# Patient Record
Sex: Female | Born: 1935 | Race: White | Hispanic: No | Smoking: Never smoker
Health system: Southern US, Community
[De-identification: ages and names within clinical notes are randomized; demographics above are authoritative.]

## PROBLEM LIST (undated history)

## (undated) DIAGNOSIS — I10 Essential (primary) hypertension: Secondary | ICD-10-CM

## (undated) DIAGNOSIS — R519 Headache, unspecified: Secondary | ICD-10-CM

## (undated) DIAGNOSIS — J189 Pneumonia, unspecified organism: Secondary | ICD-10-CM

## (undated) DIAGNOSIS — E119 Type 2 diabetes mellitus without complications: Secondary | ICD-10-CM

## (undated) DIAGNOSIS — K219 Gastro-esophageal reflux disease without esophagitis: Secondary | ICD-10-CM

## (undated) DIAGNOSIS — M51369 Other intervertebral disc degeneration, lumbar region without mention of lumbar back pain or lower extremity pain: Secondary | ICD-10-CM

## (undated) DIAGNOSIS — M5136 Other intervertebral disc degeneration, lumbar region: Secondary | ICD-10-CM

## (undated) DIAGNOSIS — T4145XA Adverse effect of unspecified anesthetic, initial encounter: Secondary | ICD-10-CM

## (undated) DIAGNOSIS — R011 Cardiac murmur, unspecified: Secondary | ICD-10-CM

## (undated) DIAGNOSIS — R0600 Dyspnea, unspecified: Secondary | ICD-10-CM

## (undated) DIAGNOSIS — I38 Endocarditis, valve unspecified: Secondary | ICD-10-CM

## (undated) DIAGNOSIS — T8859XA Other complications of anesthesia, initial encounter: Secondary | ICD-10-CM

## (undated) DIAGNOSIS — J302 Other seasonal allergic rhinitis: Secondary | ICD-10-CM

## (undated) DIAGNOSIS — M1990 Unspecified osteoarthritis, unspecified site: Secondary | ICD-10-CM

## (undated) DIAGNOSIS — I350 Nonrheumatic aortic (valve) stenosis: Secondary | ICD-10-CM

## (undated) DIAGNOSIS — N183 Chronic kidney disease, stage 3 unspecified: Secondary | ICD-10-CM

## (undated) DIAGNOSIS — F32A Depression, unspecified: Secondary | ICD-10-CM

## (undated) DIAGNOSIS — N189 Chronic kidney disease, unspecified: Secondary | ICD-10-CM

## (undated) DIAGNOSIS — E785 Hyperlipidemia, unspecified: Secondary | ICD-10-CM

## (undated) DIAGNOSIS — D649 Anemia, unspecified: Secondary | ICD-10-CM

## (undated) DIAGNOSIS — K222 Esophageal obstruction: Secondary | ICD-10-CM

## (undated) DIAGNOSIS — F419 Anxiety disorder, unspecified: Secondary | ICD-10-CM

## (undated) DIAGNOSIS — F329 Major depressive disorder, single episode, unspecified: Secondary | ICD-10-CM

## (undated) DIAGNOSIS — H269 Unspecified cataract: Secondary | ICD-10-CM

## (undated) HISTORY — PX: ABDOMINAL HYSTERECTOMY: SHX81

## (undated) HISTORY — PX: TONSILLECTOMY: SUR1361

## (undated) HISTORY — PX: CARDIAC CATHETERIZATION: SHX172

## (undated) HISTORY — PX: APPENDECTOMY: SHX54

## (undated) HISTORY — PX: COLONOSCOPY: SHX174

---

## 2004-01-18 ENCOUNTER — Other Ambulatory Visit: Payer: Self-pay

## 2004-03-08 ENCOUNTER — Ambulatory Visit: Payer: Self-pay | Admitting: Gastroenterology

## 2004-05-03 ENCOUNTER — Ambulatory Visit: Payer: Self-pay | Admitting: Internal Medicine

## 2005-06-06 ENCOUNTER — Ambulatory Visit: Payer: Self-pay | Admitting: Internal Medicine

## 2005-10-02 ENCOUNTER — Ambulatory Visit: Payer: Self-pay | Admitting: Internal Medicine

## 2006-07-30 ENCOUNTER — Ambulatory Visit: Payer: Self-pay | Admitting: Internal Medicine

## 2007-08-26 ENCOUNTER — Ambulatory Visit: Payer: Self-pay | Admitting: Internal Medicine

## 2007-09-29 ENCOUNTER — Ambulatory Visit: Payer: Self-pay | Admitting: Internal Medicine

## 2008-03-24 ENCOUNTER — Ambulatory Visit: Payer: Self-pay | Admitting: Internal Medicine

## 2008-09-28 ENCOUNTER — Ambulatory Visit: Payer: Self-pay | Admitting: Internal Medicine

## 2009-02-24 ENCOUNTER — Ambulatory Visit: Payer: Self-pay | Admitting: Rheumatology

## 2009-04-20 ENCOUNTER — Ambulatory Visit: Payer: Self-pay | Admitting: Gastroenterology

## 2009-06-11 ENCOUNTER — Inpatient Hospital Stay: Payer: Self-pay | Admitting: Internal Medicine

## 2009-11-17 ENCOUNTER — Ambulatory Visit: Payer: Self-pay | Admitting: Internal Medicine

## 2011-01-04 ENCOUNTER — Ambulatory Visit: Payer: Self-pay | Admitting: Family Medicine

## 2011-05-16 ENCOUNTER — Ambulatory Visit: Payer: Self-pay | Admitting: Orthopedic Surgery

## 2011-06-18 ENCOUNTER — Ambulatory Visit: Payer: Self-pay | Admitting: General Practice

## 2011-06-18 DIAGNOSIS — I10 Essential (primary) hypertension: Secondary | ICD-10-CM

## 2011-06-18 LAB — BASIC METABOLIC PANEL
BUN: 30 mg/dL — ABNORMAL HIGH (ref 7–18)
Chloride: 104 mmol/L (ref 98–107)
Co2: 25 mmol/L (ref 21–32)
Creatinine: 0.99 mg/dL (ref 0.60–1.30)
Potassium: 4.1 mmol/L (ref 3.5–5.1)
Sodium: 143 mmol/L (ref 136–145)

## 2011-06-18 LAB — HEMOGLOBIN: HGB: 12.3 g/dL (ref 12.0–16.0)

## 2011-06-20 ENCOUNTER — Ambulatory Visit: Payer: Self-pay | Admitting: General Practice

## 2012-01-07 ENCOUNTER — Ambulatory Visit: Payer: Self-pay | Admitting: Internal Medicine

## 2012-01-10 ENCOUNTER — Ambulatory Visit: Payer: Self-pay | Admitting: Internal Medicine

## 2012-01-28 ENCOUNTER — Ambulatory Visit: Payer: Self-pay | Admitting: General Practice

## 2012-01-28 LAB — URINALYSIS, COMPLETE
Bacteria: NONE SEEN
Glucose,UR: >=500 mg/dL (ref 0–75)
Nitrite: NEGATIVE
Ph: 5 (ref 4.5–8.0)
RBC,UR: 1 /HPF (ref 0–5)
Squamous Epithelial: 1
WBC UR: 2 /HPF (ref 0–5)

## 2012-01-28 LAB — APTT: Activated PTT: 29.7 secs (ref 23.6–35.9)

## 2012-01-28 LAB — CBC
HCT: 37 % (ref 35.0–47.0)
MCH: 30.9 pg (ref 26.0–34.0)
MCHC: 34 g/dL (ref 32.0–36.0)
MCV: 91 fL (ref 80–100)
Platelet: 287 10*3/uL (ref 150–440)
WBC: 7.5 10*3/uL (ref 3.6–11.0)

## 2012-01-28 LAB — PROTIME-INR
INR: 1
Prothrombin Time: 13.3 secs (ref 11.5–14.7)

## 2012-01-28 LAB — BASIC METABOLIC PANEL
Anion Gap: 8 (ref 7–16)
Calcium, Total: 9.1 mg/dL (ref 8.5–10.1)
Creatinine: 1.15 mg/dL (ref 0.60–1.30)
EGFR (African American): 54 — ABNORMAL LOW
EGFR (Non-African Amer.): 47 — ABNORMAL LOW
Glucose: 282 mg/dL — ABNORMAL HIGH (ref 65–99)
Potassium: 3.7 mmol/L (ref 3.5–5.1)
Sodium: 140 mmol/L (ref 136–145)

## 2012-01-28 LAB — SEDIMENTATION RATE: Erythrocyte Sed Rate: 15 mm/hr (ref 0–30)

## 2012-01-28 LAB — MRSA PCR SCREENING

## 2012-01-29 LAB — URINE CULTURE

## 2012-02-13 ENCOUNTER — Inpatient Hospital Stay: Payer: Self-pay | Admitting: General Practice

## 2012-02-14 LAB — BASIC METABOLIC PANEL
BUN: 14 mg/dL (ref 7–18)
Chloride: 101 mmol/L (ref 98–107)
EGFR (Non-African Amer.): >60
Glucose: 196 mg/dL — ABNORMAL HIGH (ref 65–99)
Osmolality: 278 (ref 275–301)
Potassium: 3.7 mmol/L (ref 3.5–5.1)
Sodium: 136 mmol/L (ref 136–145)

## 2012-02-14 LAB — PLATELET COUNT: Platelet: 221 10*3/uL (ref 150–440)

## 2012-02-15 ENCOUNTER — Encounter: Payer: Self-pay | Admitting: Internal Medicine

## 2012-02-15 LAB — BASIC METABOLIC PANEL
Anion Gap: 7 (ref 7–16)
BUN: 14 mg/dL (ref 7–18)
Calcium, Total: 8.6 mg/dL (ref 8.5–10.1)
Co2: 28 mmol/L (ref 21–32)
Creatinine: 0.87 mg/dL (ref 0.60–1.30)
Potassium: 3.7 mmol/L (ref 3.5–5.1)
Sodium: 140 mmol/L (ref 136–145)

## 2012-02-15 LAB — HEMOGLOBIN: HGB: 9.7 g/dL — ABNORMAL LOW (ref 12.0–16.0)

## 2012-02-15 LAB — PLATELET COUNT: Platelet: 214 10*3/uL (ref 150–440)

## 2012-02-21 LAB — CBC WITH DIFFERENTIAL/PLATELET
Eosinophil %: 3.7 %
Lymphocyte %: 22.6 %
MCHC: 33.5 g/dL (ref 32.0–36.0)
MCV: 91 fL (ref 80–100)
Monocyte %: 10.2 %
Neutrophil %: 62.8 %
Platelet: 408 10*3/uL (ref 150–440)
RBC: 3.14 10*6/uL — ABNORMAL LOW (ref 3.80–5.20)
WBC: 9.5 10*3/uL (ref 3.6–11.0)

## 2012-02-22 ENCOUNTER — Encounter: Payer: Self-pay | Admitting: Internal Medicine

## 2012-07-19 ENCOUNTER — Other Ambulatory Visit: Payer: Self-pay | Admitting: Gastroenterology

## 2012-07-21 LAB — CLOSTRIDIUM DIFFICILE BY PCR

## 2012-07-23 LAB — STOOL CULTURE

## 2012-08-05 ENCOUNTER — Other Ambulatory Visit: Payer: Self-pay | Admitting: Gastroenterology

## 2012-08-07 LAB — STOOL CULTURE

## 2012-08-28 ENCOUNTER — Ambulatory Visit: Payer: Self-pay | Admitting: Hematology and Oncology

## 2012-10-07 ENCOUNTER — Ambulatory Visit: Payer: Self-pay | Admitting: Hematology and Oncology

## 2012-10-07 LAB — CBC CANCER CENTER
Basophil %: 0.7 %
Eosinophil #: 0.2 x10 3/mm (ref 0.0–0.7)
HCT: 35.1 % (ref 35.0–47.0)
Lymphocyte %: 39.6 %
MCH: 29.5 pg (ref 26.0–34.0)
MCV: 87 fL (ref 80–100)
Neutrophil #: 2.9 x10 3/mm (ref 1.4–6.5)
Neutrophil %: 47.3 %
Platelet: 281 x10 3/mm (ref 150–440)
RDW: 15.4 % — ABNORMAL HIGH (ref 11.5–14.5)

## 2012-10-21 ENCOUNTER — Ambulatory Visit: Payer: Self-pay | Admitting: Hematology and Oncology

## 2012-12-01 ENCOUNTER — Ambulatory Visit: Payer: Self-pay | Admitting: Hematology and Oncology

## 2013-01-07 ENCOUNTER — Ambulatory Visit: Payer: Self-pay | Admitting: Internal Medicine

## 2013-04-23 HISTORY — PX: JOINT REPLACEMENT: SHX530

## 2013-08-19 DIAGNOSIS — H18519 Endothelial corneal dystrophy, unspecified eye: Secondary | ICD-10-CM | POA: Insufficient documentation

## 2013-09-21 HISTORY — PX: EYE SURGERY: SHX253

## 2013-09-22 DIAGNOSIS — K219 Gastro-esophageal reflux disease without esophagitis: Secondary | ICD-10-CM | POA: Insufficient documentation

## 2013-11-21 HISTORY — PX: EYE SURGERY: SHX253

## 2014-01-08 ENCOUNTER — Ambulatory Visit: Payer: Self-pay | Admitting: Internal Medicine

## 2014-03-25 ENCOUNTER — Emergency Department: Payer: Self-pay | Admitting: Emergency Medicine

## 2014-03-25 ENCOUNTER — Ambulatory Visit: Payer: Self-pay | Admitting: Gastroenterology

## 2014-08-10 NOTE — Discharge Summary (Signed)
PATIENT NAME:  Samantha Freeman, Samantha Freeman MR#:  [id number] DATE OF BIRTH:  1935/05/18  DATE OF ADMISSION:  02/13/2012 DATE OF DISCHARGE:  02/16/2012   ADMITTING DIAGNOSIS: Degenerative arthrosis of right knee.   DISCHARGE DIAGNOSIS: Degenerative arthrosis of right knee.   HISTORY: The patient is a pleasant 79 year old who has been followed at [facility] for progression of right knee pain. She underwent a right knee arthroscopy, partial medial and lateral meniscectomy, as well as chondroplasty in February 2013. She did not see any significant improvement in her condition. She also did not see any significant improvement despite the use of anti-inflammatory medications as well as a series of Synvisc injections. The patient had localized most of the pain along the medial aspect of the knee. She did report some near giving way of the knee but denied any gross locking. On occasion the patient was noted to have some activity-related swelling. Her pain had progressed to the point that it was significantly interfering with her activities of daily living. X-rays taken in [facility] showed narrowing of the medial cartilage space with associated varus alignment. She did appear to have a slight evulsion fracture off the medial tibial plateau. Some mild subchondral sclerosis was noted. After discussion of the risks and benefits of surgical intervention, the patient expressed her understanding of the risks and benefits and agreed for plans for surgical intervention.   PROCEDURE: Right total knee arthroplasty using computer-assisted navigation.   ANESTHESIA: Femoral nerve block with spinal.   SOFT TISSUE RELEASE: Anterior cruciate ligament, posterior cruciate ligament, deep and superficial medial collateral ligaments, as well as the patellofemoral ligament.   IMPLANTS UTILIZED: DePuy PFC Sigma size 2 posterior stabilized femoral component (cemented), size 2.5 MBT tibial component (cemented), 32 mm three pegged oval  dome patella (cemented), and a 12.5 mm stabilized rotating platform polyethylene insert.   HOSPITAL COURSE: The patient tolerated the procedure very well. She had no complications. She was then taken to PAC-U where she was stabilized and then transferred to the orthopedic floor. She began receiving anticoagulation therapy of Lovenox 30 mg sub-Q q.12 hours per anesthesia and pharmacy protocol. She was fitted with TED stockings bilaterally. These were allowed to be removed one hour per eight hour shift. The right one was applied on day two following removal of the Hemovac and dressing change. The patient was also fitted with the AV-I compression foot pumps bilaterally set at 80 mmHg. She's had no evidence of any deep venous thromboses. Calves have been nontender. Negative Homans sign. Heels were elevated off the bed using rolled towels.   The patient denied chest pain or shortness of breath. Vital signs have been stable. She has been afebrile. Hemodynamically she was stable. No transfusions were given other than the Autovac transfusions given the first six hours postoperatively.   The patient began receiving physical therapy on day one for gait training and transfers. She progressed very nicely. Occupational therapy was also initiated on day one for activities of daily living and assistive devices.   The patient's IV, Foley, and Hemovac were discontinued on day two along with a dressing change. The wound was free of any drainage or signs of infection. The Polar Care was reapplied to the surgical leg maintaining a temperature of 40 to 50 degrees Fahrenheit.   The patient is being discharged to skilled nursing facility in improved stable condition.   DISCHARGE INSTRUCTIONS:  1. She may weight bear as tolerated.  2. She will receive PT for gait training and transfers.  3. OT for activities of daily living and assistive devices.  4. Knee immobilizer is to be worn until she is able to do 10 straight leg  raises on her own.  5. She is to continue with TED stockings bilaterally. These are allowed to be removed one hour per eight hour shift.  6. Incentive spirometer q.1 hour while awake.  7. Encourage cough, deep breathing q.2 hours while awake.  8. Polar Care to the surgical leg maintaining a temperature of 40 to 50 degrees Fahrenheit.  9. She is placed on an ADA diet.  10. She has a follow-up appointment with Vance Peper, PA on 02/28/2012 at 10:15 and with Dr. Skip Estimable on 03/25/2012 at 3:45.  11. Elevate heels off the bed.   DRUG ALLERGIES: Codeine, hydrochlorothiazide, niacin tablets.    MEDICATIONS:  1. Insulin sliding scale Novolin R injections. 2. Tylenol ES 500 to 1000 mg q.4 hours p.r.n. for pain or temperature greater than one 100.4.  3. Celebrex 200 mg b.i.d.  4. Roxicodone 5 to 10 mg q.4 hours p.r.n. pain. 5. Tramadol 50 mg to 100 mg q.4 hours p.r.n. for pain.  6. Dulcolax suppository 10 mg rectally daily p.r.n. for constipation.  7. Milk of Magnesia 30 mL b.i.d. p.r.n. for constipation.  8. Enema soapsuds p.r.n. if no results with Milk of Magnesia or Dulcolax.  9. Mylanta DS 30 mL q.6 hours p.r.n.  10. Senokot-S 1 tablet b.i.d.  11. Vasotec 20 mg b.i.d. 12. Fenofibrate micronized 145 mg daily. 13. Ambien 10 mg at bedtime p.r.n.  14. Multivitamin 1 capsule daily.  15. Citracal Plus D 1 capsule b.i.d.  16. Celexa 40 mg daily.  17. Crestor 10 mg daily. 18. Prilosec 20 mg q.6 a.m.  19. Magnesium oxide 400 mg daily. 20. Ziac 5/6.25 mg 1 tablet daily. 21. Flonase nasal spray one spray both nostrils daily. 22. Non-formulary medication, research trial medication, 1 capsule daily for diabetes. The patient has her own medication and will be taking this with her to rehab.  23. Lovenox 30 mg sub-Q q.12 hours for 14 days, then discontinue and begin taking one 81 mg enteric-coated aspirin.   PAST MEDICAL HISTORY:  1. Chickenpox. 2. Arthritis. 3. Seasonal allergies.   4. Hypertension.  5. Hyperlipidemia.  6. Type II diabetes.  7. Shingles.  8. Hemorrhoids.  9. Cataracts, left eye.   10. Mild anxiety. 11. Hospitalized for rectal bleeding in 2011.   ____________________________ Vance Peper, PA jrw:drc D: 02/14/2012 21:43:31 ET T: 02/15/2012 05:58:21 ET JOB#: [id number]  cc: Vance Peper, PA, <Dictator> Fabricio Endsley PA ELECTRONICALLY SIGNED 02/15/2012 22:04

## 2014-08-10 NOTE — Consult Note (Signed)
Brief Consult Note: Diagnosis: uncontrolled htn.   Patient was seen by consultant.   Consult note dictated.   Orders entered.   Comments: 79 yr old female s/p right knee arhtropasty for DJD,consult requested for Uncontrolled HTN.continue enalapril 20 mg po bid,bisoprolol increased to 5/12.5 mg po bid,if bp persistantly high.,add hydrallazine 25 mg tid. DMII:BG high,but sh eis on research tril meds.  Electronic Signatures: Epifanio Lesches (MD)  (Signed 25-Oct-13 14:00)  Authored: Brief Consult Note   Last Updated: 25-Oct-13 14:00 by Epifanio Lesches (MD)

## 2014-08-10 NOTE — Consult Note (Signed)
PATIENT NAME:  Samantha Freeman, Samantha Freeman MR#:  [id number] DATE OF BIRTH:  [birth year]  DATE OF CONSULTATION:  02/15/2012  REFERRING PHYSICIAN:  Skip Estimable, MD CONSULTING PHYSICIAN:  Epifanio Lesches, MD  PRIMARY CARE PHYSICIAN:  Dr. Doy Hutching.   REASON FOR CONSULTATION:  Uncontrolled hypertension.   HISTORY OF PRESENT ILLNESS: 79 year old female with history of hypertension and diabetes, admitted on the 23rd to orthopedic service for right total knee replacement. The patient has degenerative joint disease and had a right total knee arthroplasty on the 23rd. The patient has been under orthopedic service. They noted that the patient's blood pressure is elevated up to 198/73 and 181/70. The patient's heart rate is around 70s. The patient is already on bisoprolol and lisinopril. The patient is on bisoprolol with HCTZ 5/6.25 p.o. daily. She is on Enalapril 20 mg p.o. b.i.d. The patient right now is awaiting physical therapy and denies any other complaints. No chest pain. No shortness of breath.   ALLERGIES: The patient is allergic to codeine, HCTZ and  niacin.   PRESENT MEDICATIONS:  1. Calcium citrate 1 tablet p.o. b.i.d.  2. Celebrex 200 mg p.o. b.i.d.  3. Celexa 40 mg daily. 4. Docusate Senna as needed. 5. Enalapril 20 mg p.o. b.i.d.  6. Fenofibrate 145 milligrams. 7. Fluticasone nasal spray one spray in each nostril daily. 8. Magnesium oxide 400 mg p.o. daily.  9. Reglan 10 mg p.o. daily.  10. She is on research medication for diabetes. 11. Lovenox 30 mg every 12 hours along with insulin with sliding scale coverage. 12. She is on IV fluids normal saline at 100 mL normal.   SOCIAL HISTORY: No smoking, no drinking. No drugs. Lives with husband.   PAST SURGICAL HISTORY: 1. Hysterectomy. 2. Tonsillectomy.   PAST MEDICAL HISTORY:    1. Diabetes. 2. Hypertension. 3. Hypertriglyceridemia. 4. Hyperlipidemia. 5. Depression. 6. Anxiety. 7. History of hiatal hernia.   REVIEW OF SYSTEMS:  CONSTITUTIONAL: No fever. No chills. ENT: Positive for allergies. No hearing loss. No sore throat. No difficulty swallowing. CARDIOVASCULAR: No chest pain. RESPIRATORY: No trouble breathing. GASTROINTESTINAL: Has some nausea. No vomiting. No diarrhea. GENITOURINARY: No burning sensation. MUSCULOSKELETAL: Has right knee arthroplasty, getting physical therapy. INTEGUMENTARY: No skin rashes. NEUROLOGIC: The patient has no blackouts or no fainting. PSYCH: Has history of anxiety and depression. ENDOCRINE: No thyroid problems. Has diabetes and she is on Research trial medication.   PHYSICAL EXAMINATION:   VITAL SIGNS: Temperature 99.3 this morning, pulse 69 to 75. Blood pressure this morning 188/73 and a repeat one at around 12:40 is 174/74, sats 92%.   GENERAL: The patient is alert, awake and oriented.   HEENT: Head atraumatic, normocephalic. Pupils equally reacting to light. Extraocular movements are intact.   ENT: No tympanic membrane congestion. No turbinate hypertrophy. No oropharyngeal erythema.   NECK: Normal range of motion. No JVD. No carotid bruit.   CARDIOVASCULAR: S1 and S2 regular. No murmurs.   LUNGS: Clear to auscultation. No wheeze. No rales.   ABDOMEN: Soft, nontender, nondistended. Bowel sounds present. No hernias.   MUSCULOSKELETAL: Has right knee arthroplasty with dressing present.   SKIN: No skin rashes.   NEUROLOGIC: Cranial nerves II through XII intact. Deep tendon reflexes 2+ bilaterally. Sensations are intact.   PSYCHIATRIC: Oriented to time, place, person.  LABORATORY, RADIOLOGICAL AND DIAGNOSTIC DATA: This morning sodium 140, potassium 3.7, chloride 105, bicarbonate 28, BUN 14, creatinine 0.87, glucose 207. Hemoglobin this morning 9.7. Most recent blood glucose 293.   ASSESSMENT AND PLAN: The patient is  a 79 year old female status post right knee arthroplasty has elevated hypertension. She is already on bisoprolol and lisinopril. I increased the bisoprolol to twice  daily 5/12.5 mg and continue the enalapril. If blood pressure is still high, can add the hydralazine 25 t.i.d. and continue the pain medications for the right knee. She is on oxycodone 5 to 10 q.4 hours p.r.n. For her diabetes, she is on Research Medication sugar set up and blood sugar is around 293. She is on sliding scale, so continue the sliding scale coverage. The patient will be seen by [facility] physician tomorrow.   TIME SPENT: About 55 minutes.    ____________________________ Epifanio Lesches, MD sk:ap D: 02/15/2012 14:07:18 ET T: 02/15/2012 14:47:37 ET JOB#: [id number]  cc: Epifanio Lesches, MD, <Dictator> Leonie Douglas. Doy Hutching, MD  Epifanio Lesches MD ELECTRONICALLY SIGNED 03/04/2012 19:34

## 2014-08-10 NOTE — Op Note (Signed)
PATIENT NAME:  Samantha Freeman, Samantha Freeman MR#:  [id number] DATE OF BIRTH:  Feb 05, 1936  DATE OF PROCEDURE:  02/13/2012  PREOPERATIVE DIAGNOSIS: Degenerative arthrosis of the right knee.   POSTOPERATIVE DIAGNOSIS: Degenerative arthrosis of the right knee.  PROCEDURE PERFORMED: Right total knee arthroplasty using computer-assisted navigation.   SURGEON: Laurice Record. Holley Bouche., MD  ASSISTANT: Vance Peper, PA-C (required to maintain retraction throughout the procedure)   ANESTHESIA: Femoral nerve block and spinal.   ESTIMATED BLOOD LOSS: 100 mL.   FLUIDS REPLACED: 1500 mL of crystalloid.   TOURNIQUET TIME: 85 minutes.   DRAINS: Two medium drains to Autovac.   SOFT TISSUE RELEASES: Anterior cruciate ligament, posterior cruciate ligament, deep and superficial medial collateral ligament, and patellofemoral ligament.   IMPLANTS UTILIZED: DePuy PFC Sigma size 2 posterior stabilized femoral component (cemented), size 2.5 MBT tibial component (cemented), 32 mm three peg oval dome patella (cemented), and a 12.5 mm stabilized rotating platform polyethylene insert.   INDICATIONS FOR SURGERY: The patient is a 79 year old female who has been seen for complaints of progressive right knee pain. X-rays demonstrated significant degenerative changes in tricompartmental fashion with relative varus deformity. After discussion of the risks and benefits of surgical intervention, the patient expressed her understanding of the risks and benefits and agreed with plans for surgical intervention.   PROCEDURE IN DETAIL: Patient was brought into the Operating Room and, after adequate femoral nerve block and spinal anesthesia was achieved, a tourniquet was placed on the patient's upper right thigh. Patient's right knee and leg were cleaned and prepped with alcohol and DuraPrep and draped in the usual sterile fashion. A "timeout" was performed as per usual protocol. The right lower extremity was exsanguinated using Esmarch, and  tourniquet was inflated to 300 mmHg. Anterior longitudinal incision was made followed by a standard mid vastus approach. A moderate effusion was evacuated. Deep fibers of the medial collateral ligament were elevated in subperiosteal fashion off the medial flare of the tibia so as to maintain a continuous soft tissue sleeve. Patella was subluxed laterally and patellofemoral ligament was incised. Inspection of the knee demonstrated severe degenerative changes most notably to the medial compartment. Prominent osteophytes were debrided using rongeur. Anterior and posterior cruciate ligaments were excised. Two 4.0 mm Schanz pins were inserted into the femur and into the tibia for attachment of the array of spheres used for computer-assisted navigation. Hip center was identified using circumduction technique. Distal landmarks were mapped using computer. Distal femur and proximal tibia were mapped using computer. Distal femoral cutting guide was positioned using computer-assisted navigation so as to achieve a 5 degrees distal valgus cut. Cut was performed and verified using the computer. Distal femur was sized and it was felt that a size 2 femoral component was appropriate. Size 2 cutting guide was positioned and the anterior cut was performed and verified using computer. This was followed by completion of the posterior and chamfer cuts. Femoral cutting guide for the central box was then positioned and the central box cut was performed.   Attention was then directed to the proximal tibia. Medial and lateral menisci were excised. The extramedullary tibial cutting guide was positioned using computer-assisted navigation so as to achieve 0 degree varus valgus alignment and 0 degree posterior slope. Cut was performed and verified using computer. Proximal tibia was sized and it was felt that a size 2.5 tibial tray was appropriate. Tibial and femoral trials were inserted followed by insertion of a 10 mm polyethylene insert. The  knee was felt to be  tight medially. A Cobb elevator was used to elevate the superficial fibers of the medial collateral ligament. A 10 mm polyethylene spacer was replaced with a 12.5 mm spacer. Excellent mediolateral soft tissue balancing was appreciated both in full extension and in flexion. Finally, the patella was cut and prepared so as to accommodate a 32 mm three peg oval dome patella. Patellar trial was placed and the knee was placed through a range of motion with excellent patellar tracking appreciated.   Femoral trial was removed. Central post hole for the tibial component was reamed followed by insertion of a keel punch. Tibial tray was then removed. Cut surfaces of bone were irrigated with copious amounts of normal saline with antibiotic solution using pulsatile lavage and then suctioned dry. Polymethyl methacrylate cement with gentamicin was prepared in the usual fashion using vacuum mixer. Cement was applied to the cut surface of the proximal tibia as well as along the undersurface of a size 2.5 mm MBT tibial component. Tibial component was positioned and impacted into place. Excess cement was removed using freer elevators. Cement was then applied to the cut surface of the femur as well as along the posterior flanges of size 2 posterior stabilized femoral component. Femoral component was positioned and impacted into place. Excess cement was removed using freer elevators. A 12.5 mm polyethylene trial was inserted and the knee was brought into full extension with steady axial compression applied. Finally, cement was applied to the backside of a 32 mm three peg oval dome patella and patellar component was positioned and patellar clamp applied. Excess cement was removed using freer elevators.   After adequate curing of cement, tourniquet was deflated after total tourniquet time of 85 minutes. Hemostasis was achieved using electrocautery. The knee was irrigated with copious amounts of normal saline with  antibiotic solution using pulsatile lavage and then suctioned dry. The knee was inspected for any residual cement debris. 30 mL of 0.25% Marcaine with epinephrine was injected along the posterior capsule. A 12.5 mm stabilized rotating platform polyethylene insert was inserted and the knee was placed through a range of motion. Excellent patellar tracking was appreciated and excellent mediolateral soft tissue balancing was appreciated. Two medium drains were placed in the wound bed and brought out through a separate stab incision to be attached to reinfusion system. The medial parapatellar portion of the incision was reapproximated using interrupted sutures of #1 Vicryl. Subcutaneous tissue was approximated in layers using first #0 Vicryl followed by 2-0 Vicryl. Skin was closed with skin staples. Sterile dressing was applied.   Patient tolerated procedure well. She was transported to the recovery room in stable condition.    ____________________________ Laurice Record. Holley Bouche., MD jph:cms D: 02/13/2012 11:06:24 ET T: 02/13/2012 11:18:27 ET JOB#: [id number]  cc: Jeneen Rinks P. Holley Bouche., MD, <Dictator> JAMES P Holley Bouche MD ELECTRONICALLY SIGNED 02/20/2012 6:22

## 2014-08-15 NOTE — Op Note (Signed)
PATIENT NAME:  Samantha Freeman, Samantha Freeman MR#:  [id number] DATE OF BIRTH:  [birth year]  DATE OF PROCEDURE:  06/20/2011  PREOPERATIVE DIAGNOSIS: Internal derangement of the right knee.  POSTOPERATIVE DIAGNOSES: 1. Tear of the posterior horn medial meniscus, right knee. 2. Tear of the posterior horn of the lateral meniscus, right knee. 3. 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 4. 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  PROCEDURES PERFORMED: Right knee arthroscopy, partial medial and lateral meniscectomies, and medial and lateral chondroplasties.  SURGEON: Laurice Record. Hooten, MD  ANESTHESIA: General.  ESTIMATED BLOOD LOSS: Minimal.   TOURNIQUET TIME: Not used.  DRAINS: None.   INDICATIONS FOR SURGERY: The patient is a 79 year old female who has been seen for complaints of persistent right knee pain and swelling. MRI demonstrated findings consistent with meniscal pathology. After a discussion of the risks and benefits of surgical intervention, the patient expressed her understanding of the risks and benefits and agreed with plans for surgical intervention.  PROCEDURE IN DETAIL: The patient was brought into the Operating Room and, after adequate general anesthesia was achieved, a tourniquet was placed on the patient's right thigh and the leg was placed in a leg holder. All bony prominences were well padded. The patient's right knee and leg were cleaned and prepped with alcohol and DuraPrep and draped in the usual sterile fashion. A "time out" was performed as per usual protocol. The anticipated portal sites were injected with 0.25% Marcaine with epinephrine. An anterolateral portal was created and cannula was inserted. The scope was inserted and the knee was distended with fluid using the DePuy Mitek pump. The scope was advanced down the medial gutter and into the medial compartment of the knee. Under visualization with the scope, an anteromedial portal was created and  hook probe was inserted. Inspection of the medial compartment demonstrated a complex tear of the posterior horn of the medial meniscus. This was debrided using meniscal punches and a 4.5 mm shaver. Final contouring was performed using the 50 degree ArthroCare wand. Some mild fraying was noted in the anterior horn of the medial meniscus and this was also debrided using the ArthroCare wand. There were grade III changes involving the medial and tibial surfaces of the knee and these areas were debrided and contoured using the 50 degree ArthroCare wand. The scope was then advanced into the intercondylar region. Some mild fraying in the anterior fibers of the anterior cruciate ligament were noted and these were debrided using the ArthroCare wand. Lachman's test performed under visualization demonstrated the anterior cruciate ligament to be functional.  The scope was removed from the anterolateral portal and reinserted via the anteromedial portal so as to better visualize the lateral compartment. The articular surface of the lateral femoral condyle was in good condition. There was a tear along the posterior horn of the lateral meniscus and this was debrided using meniscal punches and then contoured using the ArthroCare wand. Of note were grade IV changes of chondromalacia involving the medial tibial plateau. These areas were soft and carefully debrided using the 50 degree ArthroCare wand so as to contour the transition zones and prevent any flaking or pulling away of the articular cartilage. Finally, the scope was positioned so as to visualize the patellofemoral articulation. Some mild fraying was noted to the articular surface of the patella. Otherwise the patella appeared to track well.  The knee was irrigated with copious amounts of fluid and then suctioned dry. The anterolateral portal was reapproximated using #3-0 nylon. A combination  of 0.25% Marcaine with epinephrine and 4 mg of morphine was injected via the  scope. The scope was removed and the anteromedial portal was reapproximated using #3-0 nylon. A sterile dressing was applied followed by application of an ice wrap.   The patient tolerated the procedure well. She was transported to the Recovery Room in stable condition. ____________________________ Laurice Record. Holley Bouche., MD jph:slb D: 06/20/2011 17:43:20 ET     T: 06/21/2011 07:56:33 ET       JOB#: [id number] Laurice Record Holley Bouche MD ELECTRONICALLY SIGNED 06/22/2011 11:37

## 2015-02-02 ENCOUNTER — Other Ambulatory Visit: Payer: Self-pay | Admitting: Internal Medicine

## 2015-02-02 DIAGNOSIS — Z1231 Encounter for screening mammogram for malignant neoplasm of breast: Secondary | ICD-10-CM

## 2015-02-11 ENCOUNTER — Ambulatory Visit
Admission: RE | Admit: 2015-02-11 | Discharge: 2015-02-11 | Disposition: A | Discharge status: Home / Self Care | Payer: Medicare Other | Source: Ambulatory Visit | Attending: Internal Medicine | Admitting: Internal Medicine

## 2015-02-11 ENCOUNTER — Other Ambulatory Visit: Payer: Self-pay | Admitting: Internal Medicine

## 2015-02-11 DIAGNOSIS — Z1231 Encounter for screening mammogram for malignant neoplasm of breast: Secondary | ICD-10-CM | POA: Diagnosis not present

## 2015-03-16 DIAGNOSIS — I071 Rheumatic tricuspid insufficiency: Secondary | ICD-10-CM | POA: Insufficient documentation

## 2015-03-16 DIAGNOSIS — I340 Nonrheumatic mitral (valve) insufficiency: Secondary | ICD-10-CM | POA: Insufficient documentation

## 2015-06-24 DIAGNOSIS — E119 Type 2 diabetes mellitus without complications: Secondary | ICD-10-CM

## 2015-08-12 ENCOUNTER — Other Ambulatory Visit
Admission: RE | Admit: 2015-08-12 | Discharge: 2015-08-12 | Disposition: A | Discharge status: Home / Self Care | Payer: Medicare Other | Source: Ambulatory Visit | Attending: Gastroenterology | Admitting: Gastroenterology

## 2015-08-12 DIAGNOSIS — K529 Noninfective gastroenteritis and colitis, unspecified: Secondary | ICD-10-CM | POA: Diagnosis present

## 2015-08-12 LAB — GASTROINTESTINAL PANEL BY PCR, STOOL (REPLACES STOOL CULTURE)
ADENOVIRUS F40/41: NOT DETECTED
ASTROVIRUS: NOT DETECTED
CYCLOSPORA CAYETANENSIS: NOT DETECTED
Campylobacter species: NOT DETECTED
Cryptosporidium: NOT DETECTED
E. coli O157: NOT DETECTED
ENTAMOEBA HISTOLYTICA: NOT DETECTED
ENTEROAGGREGATIVE E COLI (EAEC): NOT DETECTED
ENTEROPATHOGENIC E COLI (EPEC): NOT DETECTED
ENTEROTOXIGENIC E COLI (ETEC): NOT DETECTED
GIARDIA LAMBLIA: NOT DETECTED
NOROVIRUS GI/GII: NOT DETECTED
Plesimonas shigelloides: NOT DETECTED
Rotavirus A: NOT DETECTED
SAPOVIRUS (I, II, IV, AND V): NOT DETECTED
Salmonella species: NOT DETECTED
Shiga like toxin producing E coli (STEC): NOT DETECTED
Shigella/Enteroinvasive E coli (EIEC): NOT DETECTED
VIBRIO CHOLERAE: NOT DETECTED
Vibrio species: NOT DETECTED
Yersinia enterocolitica: NOT DETECTED

## 2015-08-12 LAB — C DIFFICILE QUICK SCREEN W PCR REFLEX
C DIFFICILE (CDIFF) INTERP: NEGATIVE
C DIFFICILE (CDIFF) TOXIN: NEGATIVE
C Diff antigen: NEGATIVE

## 2015-12-22 ENCOUNTER — Other Ambulatory Visit: Payer: Self-pay | Admitting: Internal Medicine

## 2015-12-22 DIAGNOSIS — Z1231 Encounter for screening mammogram for malignant neoplasm of breast: Secondary | ICD-10-CM

## 2016-02-15 ENCOUNTER — Ambulatory Visit
Admission: RE | Admit: 2016-02-15 | Discharge: 2016-02-15 | Disposition: A | Discharge status: Home / Self Care | Payer: Medicare Other | Source: Ambulatory Visit | Attending: Internal Medicine | Admitting: Internal Medicine

## 2016-02-15 DIAGNOSIS — Z1231 Encounter for screening mammogram for malignant neoplasm of breast: Secondary | ICD-10-CM | POA: Insufficient documentation

## 2017-01-01 ENCOUNTER — Other Ambulatory Visit: Payer: Self-pay | Admitting: Internal Medicine

## 2017-01-01 DIAGNOSIS — Z1231 Encounter for screening mammogram for malignant neoplasm of breast: Secondary | ICD-10-CM

## 2017-02-15 ENCOUNTER — Ambulatory Visit
Admission: RE | Admit: 2017-02-15 | Discharge: 2017-02-15 | Disposition: A | Discharge status: Home / Self Care | Payer: Medicare Other | Source: Ambulatory Visit | Attending: Internal Medicine | Admitting: Internal Medicine

## 2017-02-15 DIAGNOSIS — Z1231 Encounter for screening mammogram for malignant neoplasm of breast: Secondary | ICD-10-CM | POA: Diagnosis present

## 2018-01-28 DIAGNOSIS — N183 Chronic kidney disease, stage 3 unspecified: Secondary | ICD-10-CM | POA: Diagnosis present

## 2018-03-31 ENCOUNTER — Other Ambulatory Visit: Payer: Self-pay | Admitting: Surgery

## 2018-03-31 DIAGNOSIS — M75121 Complete rotator cuff tear or rupture of right shoulder, not specified as traumatic: Secondary | ICD-10-CM

## 2018-03-31 DIAGNOSIS — M7581 Other shoulder lesions, right shoulder: Secondary | ICD-10-CM

## 2018-04-22 ENCOUNTER — Ambulatory Visit
Admission: RE | Admit: 2018-04-22 | Discharge: 2018-04-22 | Disposition: A | Discharge status: Home / Self Care | Payer: Medicare Other | Source: Ambulatory Visit | Attending: Surgery | Admitting: Surgery

## 2018-04-22 DIAGNOSIS — M75121 Complete rotator cuff tear or rupture of right shoulder, not specified as traumatic: Secondary | ICD-10-CM

## 2018-04-22 DIAGNOSIS — M7581 Other shoulder lesions, right shoulder: Secondary | ICD-10-CM

## 2018-05-09 ENCOUNTER — Other Ambulatory Visit: Payer: Self-pay

## 2018-05-09 ENCOUNTER — Encounter
Admission: RE | Admit: 2018-05-09 | Discharge: 2018-05-09 | Disposition: A | Discharge status: Home / Self Care | Payer: Medicare Other | Source: Ambulatory Visit | Attending: Surgery | Admitting: Surgery

## 2018-05-09 DIAGNOSIS — Z01812 Encounter for preprocedural laboratory examination: Secondary | ICD-10-CM | POA: Insufficient documentation

## 2018-05-09 HISTORY — DX: Anxiety disorder, unspecified: F41.9

## 2018-05-09 HISTORY — DX: Essential (primary) hypertension: I10

## 2018-05-09 HISTORY — DX: Gastro-esophageal reflux disease without esophagitis: K21.9

## 2018-05-09 HISTORY — DX: Adverse effect of unspecified anesthetic, initial encounter: T41.45XA

## 2018-05-09 HISTORY — DX: Other complications of anesthesia, initial encounter: T88.59XA

## 2018-05-09 HISTORY — DX: Depression, unspecified: F32.A

## 2018-05-09 HISTORY — DX: Major depressive disorder, single episode, unspecified: F32.9

## 2018-05-09 HISTORY — DX: Chronic kidney disease, unspecified: N18.9

## 2018-05-09 HISTORY — DX: Type 2 diabetes mellitus without complications: E11.9

## 2018-05-09 LAB — BASIC METABOLIC PANEL
Anion gap: 6 (ref 5–15)
BUN: 23 mg/dL (ref 8–23)
CALCIUM: 9.8 mg/dL (ref 8.9–10.3)
CO2: 32 mmol/L (ref 22–32)
CREATININE: 0.99 mg/dL (ref 0.44–1.00)
Chloride: 100 mmol/L (ref 98–111)
GFR calc non Af Amer: 54 mL/min — ABNORMAL LOW (ref >60)
Glucose, Bld: 215 mg/dL — ABNORMAL HIGH (ref 70–99)
Potassium: 4 mmol/L (ref 3.5–5.1)
SODIUM: 138 mmol/L (ref 135–145)

## 2018-05-09 LAB — CBC
HCT: 40.2 % (ref 36.0–46.0)
Hemoglobin: 12.6 g/dL (ref 12.0–15.0)
MCH: 30.4 pg (ref 26.0–34.0)
MCHC: 31.3 g/dL (ref 30.0–36.0)
MCV: 97.1 fL (ref 80.0–100.0)
PLATELETS: 280 10*3/uL (ref 150–400)
RBC: 4.14 MIL/uL (ref 3.87–5.11)
RDW: 13 % (ref 11.5–15.5)
WBC: 6.5 10*3/uL (ref 4.0–10.5)
nRBC: 0 % (ref 0.0–0.2)

## 2018-05-09 NOTE — Patient Instructions (Signed)
Your procedure is scheduled on: Tuesday 05/13/18  Report to [facility]. To find out your arrival time please call [phone] between 1PM - 3PM on Monday 05/12/17.   Remember: Instructions that are not followed completely may result in serious medical risk, up to and including death, or upon the discretion of your surgeon and anesthesiologist your surgery may need to be rescheduled.      _X__ 1. Do not eat food after midnight the night before your procedure.                 No gum chewing or hard candies. You may drink SUGAR FREE clear liquids up to 2 hours                 before you are scheduled to arrive for your surgery- DO NOT drink clear                 liquids within 2 hours of the start of your surgery.                   __X__2.  On the morning of surgery brush your teeth with toothpaste and water, you may rinse your mouth with mouthwash if you wish.  Do not swallow any toothpaste or mouthwash.     __X__3.  Notify your doctor if there is any change in your medical condition      (cold, fever, infections).     Do not wear jewelry, make-up, hairpins, clips or nail polish. Do not wear lotions, powders, or perfumes.  Do not wear deodorant. Do not shave 48 hours prior to surgery. Men may shave face and neck. Do not bring valuables to the hospital.    [facility] is not responsible for any belongings or valuables.  Contacts, dentures/partials or body piercings may not be worn into surgery. Bring a case for your contacts, glasses or hearing aids, a denture cup will be supplied.    Patients discharged the day of surgery will not be allowed to drive home.    Please read over the following fact sheets that you were given:   MRSA Information   __X__ Take these medicines the morning of surgery with A SIP OF WATER:     1. amLODipine (NORVASC) 10 MG tablet  2. cetirizine (ZYRTEC) 10 MG chewable tablet  3. citalopram (CELEXA)  40 MG tablet  4. fenofibrate (TRICOR) 145 MG tablet  5. omeprazole (PRILOSEC) 20 MG capsule  6. rosuvastatin (CRESTOR) 5 MG tablet (Check to see if you normally take this in the morning or at night)    __X__ Use CHG Soap as directed   __X__ Stop Metformin 3 days prior to your surgery. Your last dose will be on Saturday morning 05/10/2018.   __X__ Stop Aspirin. Last dose will be on Saturday morning 05/10/2018.   __X__ Stop Anti-inflammatories 7 days before surgery such as Advil, Ibuprofen, Motrin, BC or Goodies Powder, Naprosyn, Naproxen, Aleve, Aspirin, Meloxicam. May take Tylenol if needed for pain or discomfort.    __X__ Do not begin any new herbal supplements before your surgery.

## 2018-05-13 ENCOUNTER — Other Ambulatory Visit: Payer: Self-pay

## 2018-05-13 ENCOUNTER — Ambulatory Visit: Payer: Medicare Other

## 2018-05-13 ENCOUNTER — Ambulatory Visit: Payer: Medicare Other | Admitting: Anesthesiology

## 2018-05-13 ENCOUNTER — Ambulatory Visit
Admission: RE | Admit: 2018-05-13 | Discharge: 2018-05-14 | Disposition: A | Discharge status: Home / Self Care | Payer: Medicare Other | Attending: Surgery | Admitting: Surgery

## 2018-05-13 ENCOUNTER — Encounter: Payer: Self-pay | Admitting: *Deleted

## 2018-05-13 ENCOUNTER — Encounter
Admission: RE | Disposition: A | Discharge status: Home / Self Care | Payer: Self-pay | Source: Home / Self Care | Attending: Surgery

## 2018-05-13 DIAGNOSIS — M75100 Unspecified rotator cuff tear or rupture of unspecified shoulder, not specified as traumatic: Secondary | ICD-10-CM | POA: Diagnosis present

## 2018-05-13 DIAGNOSIS — F329 Major depressive disorder, single episode, unspecified: Secondary | ICD-10-CM | POA: Insufficient documentation

## 2018-05-13 DIAGNOSIS — Z823 Family history of stroke: Secondary | ICD-10-CM | POA: Insufficient documentation

## 2018-05-13 DIAGNOSIS — I129 Hypertensive chronic kidney disease with stage 1 through stage 4 chronic kidney disease, or unspecified chronic kidney disease: Secondary | ICD-10-CM | POA: Insufficient documentation

## 2018-05-13 DIAGNOSIS — Z7982 Long term (current) use of aspirin: Secondary | ICD-10-CM | POA: Diagnosis not present

## 2018-05-13 DIAGNOSIS — Z885 Allergy status to narcotic agent status: Secondary | ICD-10-CM | POA: Diagnosis not present

## 2018-05-13 DIAGNOSIS — K219 Gastro-esophageal reflux disease without esophagitis: Secondary | ICD-10-CM | POA: Insufficient documentation

## 2018-05-13 DIAGNOSIS — R7981 Abnormal blood-gas level: Secondary | ICD-10-CM

## 2018-05-13 DIAGNOSIS — Z96651 Presence of right artificial knee joint: Secondary | ICD-10-CM | POA: Insufficient documentation

## 2018-05-13 DIAGNOSIS — F419 Anxiety disorder, unspecified: Secondary | ICD-10-CM | POA: Insufficient documentation

## 2018-05-13 DIAGNOSIS — K222 Esophageal obstruction: Secondary | ICD-10-CM | POA: Diagnosis not present

## 2018-05-13 DIAGNOSIS — Z79899 Other long term (current) drug therapy: Secondary | ICD-10-CM | POA: Insufficient documentation

## 2018-05-13 DIAGNOSIS — Z7984 Long term (current) use of oral hypoglycemic drugs: Secondary | ICD-10-CM | POA: Diagnosis not present

## 2018-05-13 DIAGNOSIS — Z809 Family history of malignant neoplasm, unspecified: Secondary | ICD-10-CM | POA: Insufficient documentation

## 2018-05-13 DIAGNOSIS — Z808 Family history of malignant neoplasm of other organs or systems: Secondary | ICD-10-CM | POA: Insufficient documentation

## 2018-05-13 DIAGNOSIS — N189 Chronic kidney disease, unspecified: Secondary | ICD-10-CM | POA: Diagnosis not present

## 2018-05-13 DIAGNOSIS — Z90710 Acquired absence of both cervix and uterus: Secondary | ICD-10-CM | POA: Insufficient documentation

## 2018-05-13 DIAGNOSIS — E7800 Pure hypercholesterolemia, unspecified: Secondary | ICD-10-CM | POA: Diagnosis not present

## 2018-05-13 DIAGNOSIS — Z961 Presence of intraocular lens: Secondary | ICD-10-CM | POA: Insufficient documentation

## 2018-05-13 DIAGNOSIS — M7581 Other shoulder lesions, right shoulder: Secondary | ICD-10-CM | POA: Insufficient documentation

## 2018-05-13 DIAGNOSIS — M75121 Complete rotator cuff tear or rupture of right shoulder, not specified as traumatic: Secondary | ICD-10-CM | POA: Insufficient documentation

## 2018-05-13 DIAGNOSIS — Z9841 Cataract extraction status, right eye: Secondary | ICD-10-CM | POA: Insufficient documentation

## 2018-05-13 DIAGNOSIS — Z947 Corneal transplant status: Secondary | ICD-10-CM | POA: Insufficient documentation

## 2018-05-13 DIAGNOSIS — Z8249 Family history of ischemic heart disease and other diseases of the circulatory system: Secondary | ICD-10-CM | POA: Insufficient documentation

## 2018-05-13 DIAGNOSIS — Z8349 Family history of other endocrine, nutritional and metabolic diseases: Secondary | ICD-10-CM | POA: Diagnosis not present

## 2018-05-13 DIAGNOSIS — Z9842 Cataract extraction status, left eye: Secondary | ICD-10-CM | POA: Insufficient documentation

## 2018-05-13 DIAGNOSIS — Z888 Allergy status to other drugs, medicaments and biological substances status: Secondary | ICD-10-CM | POA: Insufficient documentation

## 2018-05-13 DIAGNOSIS — E1122 Type 2 diabetes mellitus with diabetic chronic kidney disease: Secondary | ICD-10-CM | POA: Insufficient documentation

## 2018-05-13 HISTORY — PX: BICEPT TENODESIS: SHX5116

## 2018-05-13 HISTORY — PX: SHOULDER ARTHROSCOPY WITH ROTATOR CUFF REPAIR: SHX5685

## 2018-05-13 LAB — GLUCOSE, CAPILLARY
Glucose-Capillary: 183 mg/dL — ABNORMAL HIGH (ref 70–99)
Glucose-Capillary: 223 mg/dL — ABNORMAL HIGH (ref 70–99)

## 2018-05-13 SURGERY — ARTHROSCOPY, SHOULDER, WITH ROTATOR CUFF REPAIR
Anesthesia: General | Laterality: Right

## 2018-05-13 MED ORDER — SODIUM CHLORIDE 0.9 % IV SOLN: INTRAVENOUS | Status: DC

## 2018-05-13 MED ORDER — METOCLOPRAMIDE HCL 10 MG PO TABS: 5.0000 mg | ORAL_TABLET | Freq: Three times a day (TID) | ORAL | Status: DC | PRN

## 2018-05-13 MED ORDER — MIDAZOLAM HCL 2 MG/2ML IJ SOLN
INTRAMUSCULAR | Status: AC
  Administered 2018-05-13: 1.5 mg via INTRAVENOUS
  Filled 2018-05-13: qty 2

## 2018-05-13 MED ORDER — BUPIVACAINE-EPINEPHRINE 0.5% -1:200000 IJ SOLN
INTRAMUSCULAR | Status: DC | PRN
  Administered 2018-05-13: 30 mL

## 2018-05-13 MED ORDER — DEXAMETHASONE SODIUM PHOSPHATE 10 MG/ML IJ SOLN
INTRAMUSCULAR | Status: DC | PRN
  Administered 2018-05-13: 5 mg via INTRAVENOUS

## 2018-05-13 MED ORDER — BISOPROLOL-HYDROCHLOROTHIAZIDE 5-6.25 MG PO TABS
0.5000 | ORAL_TABLET | Freq: Every day | ORAL | Status: DC
  Administered 2018-05-14: 0.5 via ORAL
  Filled 2018-05-13: qty 0.5

## 2018-05-13 MED ORDER — ACETAMINOPHEN 10 MG/ML IV SOLN
INTRAVENOUS | Status: AC
  Filled 2018-05-13: qty 100

## 2018-05-13 MED ORDER — GLYCOPYRROLATE 0.2 MG/ML IJ SOLN
INTRAMUSCULAR | Status: AC
  Administered 2018-05-13: 0.2 mg via INTRAVENOUS
  Filled 2018-05-13: qty 1

## 2018-05-13 MED ORDER — CALCIUM CARBONATE-VITAMIN D 500-200 MG-UNIT PO TABS
ORAL_TABLET | Freq: Every day | ORAL | Status: DC
  Administered 2018-05-14: 1 via ORAL
  Filled 2018-05-13: qty 1

## 2018-05-13 MED ORDER — ONDANSETRON HCL 4 MG PO TABS: 4.0000 mg | ORAL_TABLET | Freq: Four times a day (QID) | ORAL | Status: DC | PRN

## 2018-05-13 MED ORDER — BUPIVACAINE LIPOSOME 1.3 % IJ SUSP
INTRAMUSCULAR | Status: DC | PRN
  Administered 2018-05-13: 20 mL via PERINEURAL

## 2018-05-13 MED ORDER — CEFAZOLIN SODIUM-DEXTROSE 2-4 GM/100ML-% IV SOLN
INTRAVENOUS | Status: AC
  Filled 2018-05-13: qty 100

## 2018-05-13 MED ORDER — MAGNESIUM HYDROXIDE 400 MG/5ML PO SUSP: 30.0000 mL | Freq: Every day | ORAL | Status: DC | PRN

## 2018-05-13 MED ORDER — ETOMIDATE 2 MG/ML IV SOLN
INTRAVENOUS | Status: DC | PRN
  Administered 2018-05-13: 12 mg via INTRAVENOUS

## 2018-05-13 MED ORDER — CEFAZOLIN SODIUM 1 G IJ SOLR
2.0000 g | Freq: Once | INTRAMUSCULAR | Status: AC
  Administered 2018-05-13: 2 g via INTRAMUSCULAR
  Filled 2018-05-13: qty 20

## 2018-05-13 MED ORDER — ROSUVASTATIN CALCIUM 10 MG PO TABS
5.0000 mg | ORAL_TABLET | Freq: Every day | ORAL | Status: DC
  Administered 2018-05-14: 5 mg via ORAL
  Filled 2018-05-13: qty 1

## 2018-05-13 MED ORDER — EPHEDRINE SULFATE 50 MG/ML IJ SOLN
INTRAMUSCULAR | Status: DC | PRN
  Administered 2018-05-13 (×2): 5 mg via INTRAVENOUS

## 2018-05-13 MED ORDER — CITALOPRAM HYDROBROMIDE 20 MG PO TABS
40.0000 mg | ORAL_TABLET | Freq: Every day | ORAL | Status: DC
  Administered 2018-05-14: 40 mg via ORAL
  Filled 2018-05-13: qty 2

## 2018-05-13 MED ORDER — ACETAMINOPHEN 10 MG/ML IV SOLN
INTRAVENOUS | Status: DC | PRN
  Administered 2018-05-13: 1000 mg via INTRAVENOUS

## 2018-05-13 MED ORDER — LORATADINE 10 MG PO TABS
10.0000 mg | ORAL_TABLET | Freq: Every day | ORAL | Status: DC
  Administered 2018-05-14: 10 mg via ORAL
  Filled 2018-05-13: qty 1

## 2018-05-13 MED ORDER — AMLODIPINE BESYLATE 5 MG PO TABS
5.0000 mg | ORAL_TABLET | Freq: Every day | ORAL | Status: DC
  Administered 2018-05-14: 5 mg via ORAL
  Filled 2018-05-13: qty 1

## 2018-05-13 MED ORDER — LINAGLIPTIN 5 MG PO TABS
5.0000 mg | ORAL_TABLET | Freq: Every day | ORAL | Status: DC
  Administered 2018-05-14: 5 mg via ORAL
  Filled 2018-05-13: qty 1

## 2018-05-13 MED ORDER — SUGAMMADEX SODIUM 200 MG/2ML IV SOLN
INTRAVENOUS | Status: AC
  Filled 2018-05-13: qty 2

## 2018-05-13 MED ORDER — POTASSIUM CHLORIDE IN NACL 20-0.9 MEQ/L-% IV SOLN
INTRAVENOUS | Status: DC
  Administered 2018-05-14: via INTRAVENOUS
  Filled 2018-05-13 (×4): qty 1000

## 2018-05-13 MED ORDER — OXYCODONE HCL 5 MG PO TABS: 5.0000 mg | ORAL_TABLET | ORAL | Status: DC | PRN

## 2018-05-13 MED ORDER — DEXAMETHASONE SODIUM PHOSPHATE 10 MG/ML IJ SOLN
INTRAMUSCULAR | Status: AC
  Filled 2018-05-13: qty 1

## 2018-05-13 MED ORDER — EPINEPHRINE PF 1 MG/ML IJ SOLN
INTRAMUSCULAR | Status: AC
  Filled 2018-05-13: qty 2

## 2018-05-13 MED ORDER — GLYCOPYRROLATE 0.2 MG/ML IJ SOLN
0.2000 mg | Freq: Once | INTRAMUSCULAR | Status: AC
  Administered 2018-05-13: 0.2 mg via INTRAVENOUS

## 2018-05-13 MED ORDER — BUPIVACAINE HCL (PF) 0.5 % IJ SOLN
INTRAMUSCULAR | Status: AC
  Filled 2018-05-13: qty 10

## 2018-05-13 MED ORDER — TRAMADOL HCL 50 MG PO TABS
50.0000 mg | ORAL_TABLET | Freq: Four times a day (QID) | ORAL | Status: DC | PRN
  Administered 2018-05-14: 50 mg via ORAL
  Filled 2018-05-13: qty 1

## 2018-05-13 MED ORDER — ONDANSETRON HCL 4 MG/2ML IJ SOLN: 4.0000 mg | Freq: Four times a day (QID) | INTRAMUSCULAR | Status: DC | PRN

## 2018-05-13 MED ORDER — SEMAGLUTIDE 14 MG PO TABS: 1.0000 | ORAL_TABLET | Freq: Every day | ORAL | Status: DC

## 2018-05-13 MED ORDER — FENOFIBRATE 160 MG PO TABS
160.0000 mg | ORAL_TABLET | Freq: Every day | ORAL | Status: DC
  Administered 2018-05-14: 160 mg via ORAL
  Filled 2018-05-13: qty 1

## 2018-05-13 MED ORDER — INSULIN ASPART 100 UNIT/ML ~~LOC~~ SOLN
0.0000 [IU] | Freq: Three times a day (TID) | SUBCUTANEOUS | Status: DC
  Filled 2018-05-13: qty 1

## 2018-05-13 MED ORDER — MIDAZOLAM HCL 2 MG/2ML IJ SOLN
1.5000 mg | Freq: Once | INTRAMUSCULAR | Status: AC
  Administered 2018-05-13: 1.5 mg via INTRAVENOUS

## 2018-05-13 MED ORDER — ACETAMINOPHEN 500 MG PO TABS
1000.0000 mg | ORAL_TABLET | Freq: Four times a day (QID) | ORAL | Status: DC
  Administered 2018-05-13 – 2018-05-14 (×2): 1000 mg via ORAL
  Filled 2018-05-13 (×2): qty 2

## 2018-05-13 MED ORDER — FERROUS SULFATE 325 (65 FE) MG PO TABS
325.0000 mg | ORAL_TABLET | Freq: Every day | ORAL | Status: DC
  Administered 2018-05-14: 325 mg via ORAL
  Filled 2018-05-13: qty 1

## 2018-05-13 MED ORDER — ROCURONIUM BROMIDE 100 MG/10ML IV SOLN
INTRAVENOUS | Status: DC | PRN
  Administered 2018-05-13: 30 mg via INTRAVENOUS
  Administered 2018-05-13: 10 mg via INTRAVENOUS

## 2018-05-13 MED ORDER — ONDANSETRON HCL 4 MG/2ML IJ SOLN
INTRAMUSCULAR | Status: DC | PRN
  Administered 2018-05-13: 4 mg via INTRAVENOUS

## 2018-05-13 MED ORDER — DIPHENHYDRAMINE HCL 12.5 MG/5ML PO ELIX: 12.5000 mg | ORAL_SOLUTION | ORAL | Status: DC | PRN

## 2018-05-13 MED ORDER — LIDOCAINE HCL (CARDIAC) PF 100 MG/5ML IV SOSY
PREFILLED_SYRINGE | INTRAVENOUS | Status: DC | PRN
  Administered 2018-05-13: 60 mg via INTRAVENOUS

## 2018-05-13 MED ORDER — FLEET ENEMA 7-19 GM/118ML RE ENEM: 1.0000 | ENEMA | Freq: Once | RECTAL | Status: DC | PRN

## 2018-05-13 MED ORDER — ASPIRIN EC 81 MG PO TBEC
81.0000 mg | DELAYED_RELEASE_TABLET | ORAL | Status: DC
  Administered 2018-05-14: 81 mg via ORAL
  Filled 2018-05-13: qty 1

## 2018-05-13 MED ORDER — HYDROCHLOROTHIAZIDE 25 MG PO TABS
12.5000 mg | ORAL_TABLET | Freq: Every day | ORAL | Status: DC
  Administered 2018-05-14: 12.5 mg via ORAL
  Filled 2018-05-13: qty 1

## 2018-05-13 MED ORDER — BUPIVACAINE LIPOSOME 1.3 % IJ SUSP
INTRAMUSCULAR | Status: AC
  Filled 2018-05-13: qty 20

## 2018-05-13 MED ORDER — BUPIVACAINE-EPINEPHRINE (PF) 0.5% -1:200000 IJ SOLN
INTRAMUSCULAR | Status: AC
  Filled 2018-05-13: qty 30

## 2018-05-13 MED ORDER — ROCURONIUM BROMIDE 50 MG/5ML IV SOLN
INTRAVENOUS | Status: AC
  Filled 2018-05-13: qty 1

## 2018-05-13 MED ORDER — LIDOCAINE HCL (PF) 1 % IJ SOLN
INTRAMUSCULAR | Status: AC
  Filled 2018-05-13: qty 5

## 2018-05-13 MED ORDER — DOCUSATE SODIUM 100 MG PO CAPS
100.0000 mg | ORAL_CAPSULE | Freq: Two times a day (BID) | ORAL | Status: DC
  Filled 2018-05-13: qty 1

## 2018-05-13 MED ORDER — MELATONIN 5 MG PO TABS
2.5000 mg | ORAL_TABLET | Freq: Every evening | ORAL | Status: DC | PRN
  Administered 2018-05-14: 5 mg via ORAL
  Filled 2018-05-13 (×2): qty 1

## 2018-05-13 MED ORDER — DESOXIMETASONE 0.05 % EX OINT: 1.0000 "application " | TOPICAL_OINTMENT | Freq: Every day | CUTANEOUS | Status: DC | PRN

## 2018-05-13 MED ORDER — MELATONIN 5 MG PO CAPS: 2.5000 mg | ORAL_CAPSULE | Freq: Every evening | ORAL | Status: DC | PRN

## 2018-05-13 MED ORDER — ENALAPRIL MALEATE 20 MG PO TABS
20.0000 mg | ORAL_TABLET | Freq: Two times a day (BID) | ORAL | Status: DC
  Administered 2018-05-14: 20 mg via ORAL
  Filled 2018-05-13 (×2): qty 1

## 2018-05-13 MED ORDER — PANTOPRAZOLE SODIUM 40 MG PO TBEC
40.0000 mg | DELAYED_RELEASE_TABLET | Freq: Every day | ORAL | Status: DC
  Administered 2018-05-14: 40 mg via ORAL
  Filled 2018-05-13: qty 1

## 2018-05-13 MED ORDER — FENTANYL CITRATE (PF) 100 MCG/2ML IJ SOLN
25.0000 ug | Freq: Once | INTRAMUSCULAR | Status: AC
  Administered 2018-05-13: 25 ug via INTRAVENOUS

## 2018-05-13 MED ORDER — FENTANYL CITRATE (PF) 100 MCG/2ML IJ SOLN
INTRAMUSCULAR | Status: AC
  Filled 2018-05-13: qty 2

## 2018-05-13 MED ORDER — ALPRAZOLAM 0.5 MG PO TABS: 0.5000 mg | ORAL_TABLET | Freq: Every evening | ORAL | Status: DC | PRN

## 2018-05-13 MED ORDER — ADULT MULTIVITAMIN W/MINERALS CH
1.0000 | ORAL_TABLET | Freq: Every day | ORAL | Status: DC
  Administered 2018-05-14: 1 via ORAL
  Filled 2018-05-13: qty 1

## 2018-05-13 MED ORDER — METOCLOPRAMIDE HCL 5 MG/ML IJ SOLN: 5.0000 mg | Freq: Three times a day (TID) | INTRAMUSCULAR | Status: DC | PRN

## 2018-05-13 MED ORDER — SODIUM CHLORIDE 0.9 % IV SOLN
INTRAVENOUS | Status: DC
  Administered 2018-05-13 (×2): via INTRAVENOUS

## 2018-05-13 MED ORDER — PREDNISOLONE ACETATE 1 % OP SUSP
1.0000 [drp] | Freq: Every day | OPHTHALMIC | Status: DC
  Filled 2018-05-13: qty 1

## 2018-05-13 MED ORDER — ONDANSETRON HCL 4 MG/2ML IJ SOLN
INTRAMUSCULAR | Status: AC
  Filled 2018-05-13: qty 2

## 2018-05-13 MED ORDER — SUGAMMADEX SODIUM 200 MG/2ML IV SOLN
INTRAVENOUS | Status: DC | PRN
  Administered 2018-05-13: 150 mg via INTRAVENOUS

## 2018-05-13 MED ORDER — VITAMIN B-12 1000 MCG PO TABS
500.0000 ug | ORAL_TABLET | Freq: Every day | ORAL | Status: DC
  Administered 2018-05-14: 500 ug via ORAL
  Filled 2018-05-13: qty 1

## 2018-05-13 MED ORDER — ACETAMINOPHEN 325 MG PO TABS: 325.0000 mg | ORAL_TABLET | Freq: Four times a day (QID) | ORAL | Status: DC | PRN

## 2018-05-13 MED ORDER — FENTANYL CITRATE (PF) 100 MCG/2ML IJ SOLN
INTRAMUSCULAR | Status: DC | PRN
  Administered 2018-05-13: 25 ug via INTRAVENOUS
  Administered 2018-05-13: 50 ug via INTRAVENOUS
  Administered 2018-05-13: 25 ug via INTRAVENOUS

## 2018-05-13 MED ORDER — ETOMIDATE 2 MG/ML IV SOLN
INTRAVENOUS | Status: AC
  Filled 2018-05-13: qty 10

## 2018-05-13 MED ORDER — VITAMIN D 25 MCG (1000 UNIT) PO TABS
1000.0000 [IU] | ORAL_TABLET | Freq: Every day | ORAL | Status: DC
  Administered 2018-05-14: 1000 [IU] via ORAL
  Filled 2018-05-13: qty 1

## 2018-05-13 MED ORDER — LIDOCAINE HCL (PF) 2 % IJ SOLN
INTRAMUSCULAR | Status: AC
  Filled 2018-05-13: qty 10

## 2018-05-13 MED ORDER — BISACODYL 10 MG RE SUPP: 10.0000 mg | Freq: Every day | RECTAL | Status: DC | PRN

## 2018-05-13 MED ORDER — OXYCODONE HCL 5 MG PO TABS: 5.0000 mg | ORAL_TABLET | ORAL | 0 refills | Status: DC | PRN

## 2018-05-13 MED ORDER — METFORMIN HCL 500 MG PO TABS
1000.0000 mg | ORAL_TABLET | Freq: Two times a day (BID) | ORAL | Status: DC
  Administered 2018-05-14: 1000 mg via ORAL
  Filled 2018-05-13 (×2): qty 2

## 2018-05-13 MED ORDER — MAGNESIUM OXIDE 400 (241.3 MG) MG PO TABS
400.0000 mg | ORAL_TABLET | Freq: Three times a day (TID) | ORAL | Status: DC
  Administered 2018-05-14: 400 mg via ORAL
  Filled 2018-05-13: qty 1

## 2018-05-13 MED ORDER — FENTANYL CITRATE (PF) 100 MCG/2ML IJ SOLN
INTRAMUSCULAR | Status: AC
  Administered 2018-05-13: 25 ug via INTRAVENOUS
  Filled 2018-05-13: qty 2

## 2018-05-13 MED ORDER — BUPIVACAINE HCL (PF) 0.5 % IJ SOLN
INTRAMUSCULAR | Status: DC | PRN
  Administered 2018-05-13: 10 mL
  Administered 2018-05-13: 10 mL via PERINEURAL

## 2018-05-13 MED ORDER — GLIMEPIRIDE 2 MG PO TABS
2.0000 mg | ORAL_TABLET | Freq: Two times a day (BID) | ORAL | Status: DC
  Administered 2018-05-14: 2 mg via ORAL
  Filled 2018-05-13 (×2): qty 1

## 2018-05-13 SURGICAL SUPPLY — 43 items
ANCHOR JUGGERKNOT WTAP NDL 2.9 (Anchor) ×6 IMPLANT
ANCHOR SUT QUATTRO KNTLS 4.5 (Anchor) ×2 IMPLANT
BIT DRILL JUGRKNT W/NDL BIT2.9 (DRILL) ×1 IMPLANT
BLADE FULL RADIUS 3.5 (BLADE) ×2 IMPLANT
BUR ACROMIONIZER 4.0 (BURR) ×2 IMPLANT
CANNULA SHAVER 8MMX76MM (CANNULA) ×2 IMPLANT
CHLORAPREP W/TINT 26ML (MISCELLANEOUS) ×2 IMPLANT
COVER MAYO STAND STRL (DRAPES) ×2 IMPLANT
COVER WAND RF STERILE (DRAPES) ×2 IMPLANT
DRAPE IMP U-DRAPE 54X76 (DRAPES) ×4 IMPLANT
DRILL JUGGERKNOT W/NDL BIT 2.9 (DRILL) ×2
ELECT REM PT RETURN 9FT ADLT (ELECTROSURGICAL) ×2
ELECTRODE REM PT RTRN 9FT ADLT (ELECTROSURGICAL) ×1 IMPLANT
GAUZE PETRO XEROFOAM 1X8 (MISCELLANEOUS) ×2 IMPLANT
GAUZE SPONGE 4X4 12PLY STRL (GAUZE/BANDAGES/DRESSINGS) ×2 IMPLANT
GLOVE BIO SURGEON STRL SZ7.5 (GLOVE) ×4 IMPLANT
GLOVE BIO SURGEON STRL SZ8 (GLOVE) ×4 IMPLANT
GLOVE BIOGEL PI IND STRL 8 (GLOVE) ×1 IMPLANT
GLOVE BIOGEL PI INDICATOR 8 (GLOVE) ×1
GLOVE INDICATOR 8.0 STRL GRN (GLOVE) ×2 IMPLANT
GOWN STRL REUS W/ TWL LRG LVL3 (GOWN DISPOSABLE) ×1 IMPLANT
GOWN STRL REUS W/ TWL XL LVL3 (GOWN DISPOSABLE) ×1 IMPLANT
GOWN STRL REUS W/TWL LRG LVL3 (GOWN DISPOSABLE) ×1
GOWN STRL REUS W/TWL XL LVL3 (GOWN DISPOSABLE) ×1
GRASPER SUT 15 45D LOW PRO (SUTURE) IMPLANT
IV LACTATED RINGER IRRG 3000ML (IV SOLUTION) ×2
IV LR IRRIG 3000ML ARTHROMATIC (IV SOLUTION) ×2 IMPLANT
MANIFOLD NEPTUNE II (INSTRUMENTS) ×2 IMPLANT
MASK FACE SPIDER DISP (MASK) ×2 IMPLANT
MAT ABSORB  FLUID 56X50 GRAY (MISCELLANEOUS) ×1
MAT ABSORB FLUID 56X50 GRAY (MISCELLANEOUS) ×1 IMPLANT
PACK ARTHROSCOPY SHOULDER (MISCELLANEOUS) ×2 IMPLANT
SLING ARM LRG DEEP (SOFTGOODS) ×2 IMPLANT
SLING ULTRA II LG (MISCELLANEOUS) ×2 IMPLANT
STAPLER SKIN PROX 35W (STAPLE) ×2 IMPLANT
STRAP SAFETY 5IN WIDE (MISCELLANEOUS) ×2 IMPLANT
SUT ETHIBOND 0 MO6 C/R (SUTURE) ×2 IMPLANT
SUT VIC AB 2-0 CT1 27 (SUTURE) ×2
SUT VIC AB 2-0 CT1 TAPERPNT 27 (SUTURE) ×2 IMPLANT
TAPE MICROFOAM 4IN (TAPE) ×2 IMPLANT
TUBING ARTHRO INFLOW-ONLY STRL (TUBING) ×2 IMPLANT
TUBING CONNECTING 10 (TUBING) ×2 IMPLANT
WAND WEREWOLF FLOW 90D (MISCELLANEOUS) ×2 IMPLANT

## 2018-05-13 NOTE — Anesthesia Procedure Notes (Signed)
Anesthesia Regional Block: Interscalene brachial plexus block   Pre-Anesthetic Checklist: ,, timeout performed, Correct Patient, Correct Site, Correct Laterality, Correct Procedure, Correct Position, site marked, Risks and benefits discussed,  Surgical consent,  Pre-op evaluation,  At surgeon's request and post-op pain management  Laterality: Right  Prep: alcohol swabs       Needles:  Injection technique: Single-shot  Needle Type: Echogenic Stimulator Needle     Needle Length: 10cm  Needle Gauge: 20     Additional Needles:   Procedures:, nerve stimulator,,, ultrasound used (permanent image in chart),,,,   Nerve Stimulator or Paresthesia:  Response: biceps flexion,   Additional Responses:   Narrative:  Injection made incrementally with aspirations every 5 mL.  Performed by: Personally   Additional Notes: Functioning IV was confirmed and monitors were applied.  Sterile prep and drape,hand hygiene and sterile gloves were used.  Negative aspiration and negative test dose prior to incremental administration of local anesthetic. The patient tolerated the procedure well.

## 2018-05-13 NOTE — Anesthesia Preprocedure Evaluation (Addendum)
Anesthesia Evaluation  Patient identified by MRN, date of birth, ID band Patient awake    Reviewed: Allergy & Precautions, H&P , NPO status , Patient's Chart, lab work & pertinent test results, reviewed documented beta blocker date and time   History of Anesthesia Complications (+) history of anesthetic complications  Airway Mallampati: II  TM Distance: >3 FB Neck ROM: full    Dental  (+) Teeth Intact   Pulmonary neg pulmonary ROS,    Pulmonary exam normal        Cardiovascular Exercise Tolerance: Good hypertension, On Medications negative cardio ROS Normal cardiovascular exam Rhythm:regular Rate:Normal     Neuro/Psych PSYCHIATRIC DISORDERS Anxiety Depression negative neurological ROS  negative psych ROS   GI/Hepatic negative GI ROS, Neg liver ROS, GERD  Medicated,  Endo/Other  negative endocrine ROSdiabetes, Well Controlled, Type 2, Oral Hypoglycemic Agents  Renal/GU Renal diseasenegative Renal ROS  negative genitourinary   Musculoskeletal   Abdominal   Peds  Hematology negative hematology ROS (+)   Anesthesia Other Findings Past Medical History: No date: Anxiety No date: Chronic kidney disease No date: Complication of anesthesia     Comment:  Propofol anaphylaxis No date: Depression No date: Diabetes mellitus without complication (HCC) No date: GERD (gastroesophageal reflux disease) No date: Hypertension Past Surgical History: No date: ABDOMINAL HYSTERECTOMY No date: APPENDECTOMY No date: CARDIAC CATHETERIZATION No date: COLONOSCOPY 11/2013: EYE SURGERY; Left     Comment:  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; Right     Comment:  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; Right     Comment:  knee No date: TONSILLECTOMY   Reproductive/Obstetrics negative OB ROS                            Anesthesia Physical Anesthesia Plan  ASA: III  Anesthesia Plan: General ETT    Post-op Pain Management:  Regional for Post-op pain   Induction:   PONV Risk Score and Plan:   Airway Management Planned:   Additional Equipment:   Intra-op Plan:   Post-operative Plan:   Informed Consent: I have reviewed the patients History and Physical, chart, labs and discussed the procedure including the risks, benefits and alternatives for the proposed anesthesia with the patient or authorized representative who has indicated his/her understanding and acceptance.     Dental Advisory Given  Plan Discussed with: CRNA  Anesthesia Plan Comments: (Pt describes problems attributed to propofol with previous egds.  Her hx is compatible with scope trauma yielding upper resp stridor and not consistent with propofol allergy yet we will proceed with amidate for induction.  JA)       Anesthesia Quick Evaluation

## 2018-05-13 NOTE — Discharge Instructions (Addendum)
Orthopedic discharge instructions: Keep dressing dry and intact.  May shower after dressing changed on post-op day #4 (Saturday).  Cover staples with Band-Aids after drying off. Apply ice frequently to shoulder. Take ibuprofen 600 mg TID with meals for 7-10 days, then as necessary. Take oxycodone as prescribed when needed.  May supplement with ES Tylenol if necessary. Keep shoulder immobilizer on at all times except may remove for bathing purposes. Follow-up in 10-14 days or as scheduled.

## 2018-05-13 NOTE — Op Note (Signed)
05/13/2018  6:11 PM  Patient:   Samantha Freeman  Pre-Op Diagnosis:   Nontraumatic full-thickness rotator cuff tear, right shoulder.  Post-Op Diagnosis:   Nontraumatic full-thickness rotator cuff tear and biceps tendinopathy, right shoulder.  Procedure:   Limited arthroscopic debridement, arthroscopic subacromial decompression, mini-open rotator cuff repair, and mini-open biceps tenodesis, right shoulder.  Anesthesia:   General endotracheal with interscalene block placed preoperatively by the anesthesiologist.  Surgeon:   Pascal Lux, MD  Assistant:   Cameron Proud, PA-C  Findings:   As above.  There were areas of degenerative labral fraying anteriorly and superiorly without frank detachment from the glenoid.  There was significant tendinitis along the long head of the biceps tendon without partial or full-thickness tearing.  There was a full-thickness tear of the anterior and mid-insertional fibers of the supraspinatus measuring approximately 1.5 x 1.0 cm.  The remainder the rotator cuff was in satisfactory condition.  The torn portion of the rotator cuff was severely tendinopathic and the tissue was of poor quality.  Complications:   None  Fluids:   1200 cc  Estimated blood loss:   5 cc  Tourniquet time:   None  Drains:   None  Closure:   Staples      Brief clinical note:   The patient is an 83 year old female with a history of progressively worsening right shoulder pain. The patient's symptoms have progressed despite medications, activity modification, etc. The patient's history and examination are consistent with impingement/tendinopathy with a rotator cuff tear. These findings were confirmed by MRI scan. The patient presents at this time for definitive management of these shoulder symptoms.  Procedure:   The patient underwent placement of an interscalene block by the anesthesiologist in the preoperative holding area before being brought into the operating room and lain in the  supine position. The patient then underwent general endotracheal intubation and anesthesia before being repositioned in the beach chair position using the beach chair positioner. The right shoulder and upper extremity were prepped with ChloraPrep solution before being draped sterilely. Preoperative antibiotics were administered. A timeout was performed to confirm the proper surgical site before the expected portal sites and incision site were injected with 0.5% Sensorcaine with epinephrine. A posterior portal was created and the glenohumeral joint thoroughly inspected with the findings as described above. An anterior portal was created using an outside-in technique. The labrum and rotator cuff were further probed, again confirming the above-noted findings. The areas of labral fraying were debrided back to stable margins using the full-radius resector, as were areas of synovitis anteriorly and superiorly. The ArthroCare wand was inserted and used to obtain hemostasis as well as to "anneal" the labrum superiorly and anteriorly. The instruments were removed from the joint after suctioning the excess fluid.  The camera was repositioned through the posterior portal into the subacromial space. A separate lateral portal was created using an outside-in technique. The 3.5 mm full-radius resector was introduced and used to perform a subtotal bursectomy. The ArthroCare wand was then inserted and used to remove the periosteal tissue off the undersurface of the anterior third of the acromion as well as to recess the coracoacromial ligament from its attachment along the anterior and lateral margins of the acromion. The 4.0 mm acromionizing bur was introduced and used to complete the decompression by removing the undersurface of the anterior third of the acromion. The full radius resector was reintroduced to remove any residual bony debris before the ArthroCare wand was reintroduced to obtain hemostasis. The  instruments were  then removed from the subacromial space after suctioning the excess fluid.  An approximately 4-5 cm incision was made over the anterolateral aspect of the shoulder beginning at the anterolateral corner of the acromion and extending distally in line with the bicipital groove. This incision was carried down through the subcutaneous tissues to expose the deltoid fascia. The raphae between the anterior and middle thirds was identified and this plane developed to provide access into the subacromial space. Additional bursal tissues were debrided sharply using Metzenbaum scissors. The rotator cuff tear was readily identified. The margins were debrided sharply with a #15 blade and the exposed greater tuberosity roughened with a rongeur. The tear was repaired using two Biomet 2.9 mm JuggerKnot anchors. These sutures were then brought back laterally and secured using a single [place] QuatroLink anchors to create a two-layer closure. An apparent watertight closure was obtained.  The bicipital groove was identified by palpation and opened for 1-1.5 cm. The biceps tendon stump was retrieved through this defect. The floor of the bicipital groove was roughened with a curet before another Biomet 2.9 mm JuggerKnot anchor was inserted. Both sets of sutures were passed through the biceps tendon and tied securely to effect the tenodesis. The bicipital sheath was reapproximated using two #0 Ethibond interrupted sutures, incorporating the biceps tendon to further reinforce the tenodesis.  The wound was copiously irrigated with sterile saline solution before the deltoid raphae was reapproximated using 2-0 Vicryl interrupted sutures. The subcutaneous tissues were closed in two layers using 2-0 Vicryl interrupted sutures before the skin was closed using staples. The portal sites also were closed using staples. A sterile bulky dressing was applied to the shoulder before the arm was placed into a shoulder immobilizer. The patient was  then awakened, extubated, and returned to the recovery room in satisfactory condition after tolerating the procedure well.

## 2018-05-13 NOTE — Transfer of Care (Signed)
Immediate Anesthesia Transfer of Care Note  Patient: Makinsey Pepitone  Procedure(s) Performed: SHOULDER ARTHROSCOPY WITH DEBRIDEMENT, DECOMPRESSION AND ROTATOR CUFF REPAIR (Right ) BICEPS TENODESIS (Right )  Patient Location: PACU  Anesthesia Type:General  Level of Consciousness: awake, alert , oriented and patient cooperative  Airway & Oxygen Therapy: Patient Spontanous Breathing and Patient connected to face mask oxygen  Post-op Assessment: Report given to RN and Post -op Vital signs reviewed and stable  Post vital signs: Reviewed and stable  Last Vitals:  Vitals Value Taken Time  BP    Temp    Pulse 80 05/13/2018  6:35 PM  Resp 24 05/13/2018  6:35 PM  SpO2 96 % 05/13/2018  6:35 PM  Vitals shown include unvalidated device data.  Last Pain:  Vitals:   05/13/18 1608  TempSrc:   PainSc: 0-No pain         Complications: No apparent anesthesia complications

## 2018-05-13 NOTE — Anesthesia Procedure Notes (Signed)
Procedure Name: Intubation Date/Time: 05/13/2018 5:00 PM Performed by: Dionne Bucy, CRNA Pre-anesthesia Checklist: Patient identified, Patient being monitored, Timeout performed, Emergency Drugs available and Suction available Patient Re-evaluated:Patient Re-evaluated prior to induction Oxygen Delivery Method: Circle system utilized Preoxygenation: Pre-oxygenation with 100% oxygen Induction Type: IV induction Ventilation: Mask ventilation without difficulty Laryngoscope Size: Mac and 3 Grade View: Grade I Tube type: Oral Tube size: 7.0 mm Number of attempts: 1 Airway Equipment and Method: Stylet Placement Confirmation: ETT inserted through vocal cords under direct vision,  positive ETCO2 and breath sounds checked- equal and bilateral Secured at: 21 cm Tube secured with: Tape Dental Injury: Teeth and Oropharynx as per pre-operative assessment

## 2018-05-13 NOTE — Anesthesia Post-op Follow-up Note (Signed)
Anesthesia QCDR form completed.        

## 2018-05-13 NOTE — H&P (Signed)
Paper H&P to be scanned into permanent record. H&P reviewed and patient re-examined. No changes. 

## 2018-05-14 ENCOUNTER — Other Ambulatory Visit: Payer: Self-pay

## 2018-05-14 ENCOUNTER — Encounter: Payer: Self-pay | Admitting: Surgery

## 2018-05-14 DIAGNOSIS — M75121 Complete rotator cuff tear or rupture of right shoulder, not specified as traumatic: Secondary | ICD-10-CM | POA: Diagnosis not present

## 2018-05-14 LAB — GLUCOSE, CAPILLARY
Glucose-Capillary: 220 mg/dL — ABNORMAL HIGH (ref 70–99)
Glucose-Capillary: 237 mg/dL — ABNORMAL HIGH (ref 70–99)

## 2018-05-14 NOTE — Progress Notes (Signed)
Physical Therapy Treatment Patient Details Name: Samantha Freeman MRN: [id number] DOB: Apr 27, 1935 Today's Date: 05/14/2018    History of Present Illness Pt is an 83 year old female admitted s/p R rotator cuff repair (supraspinatus), mini open biceps tenodesis and debridement.  PMH includes anxiety, depression, DM, and CKD.    PT Comments    Pt able to progress ambulatory distance today and demonstrate more stable vitals.  She completed 200 ft of ambulation with SPC and reporting less "weakness" than this morning.  She did present with some gait deviations which indicate need for [facility], which pt is agreeable to use of (pt already has one at home).  PT answered all pertinent questions regarding HEP after discharge and home setup.  She will continue to benefit from skilled PT with focus on strength, safe functional mobility and tolerance to activity.  Follow Up Recommendations  Home health PT;Supervision - Intermittent     Equipment Recommendations  None recommended by PT    Recommendations for Other Services       Precautions / Restrictions Precautions Precautions: Shoulder Type of Shoulder Precautions: Biceps tenodesis Shoulder Interventions: Shoulder sling/immobilizer Precaution Booklet Issued: Yes (comment)    Mobility  Bed Mobility Overal bed mobility: Modified Independent             General bed mobility comments: Increased time and HOB elevated  Transfers Overall transfer level: Needs assistance Equipment used: Rolling walker (2 wheeled) Transfers: Sit to/from Stand Sit to Stand: Supervision         General transfer comment: able to stand without physical assist, uses unilateral UE some  Ambulation/Gait Ambulation/Gait assistance: Min guard Gait Distance (Feet): 200 Feet Assistive device: Straight cane     Gait velocity interpretation: 1.31 - 2.62 ft/sec, indicative of limited community ambulator General Gait Details: Lateral deviations every 5-6 steps, more  stable with SPC.  O2 desat <90% two times but quickly  recovered with rest and deep breathing.   Stairs             Wheelchair Mobility    Modified Rankin (Stroke Patients Only)       Balance Overall balance assessment: Needs assistance Sitting-balance support: Feet supported Sitting balance-Leahy Scale: Good     Standing balance support: Single extremity supported Standing balance-Leahy Scale: Fair Standing balance comment: More stable with SPC, mild unsteadiness observed.                            Cognition Arousal/Alertness: Awake/alert Behavior During Therapy: WFL for tasks assessed/performed Overall Cognitive Status: Within Functional Limits for tasks assessed                                 General Comments: More alert this afternoon      Exercises Other Exercises Other Exercises: Time to answer questions regarding home setup and management of HEP. x5 min    General Comments        Pertinent Vitals/Pain Pain Assessment: 0-10 Pain Score: 7  Pain Location: R shoulder Pain Descriptors / Indicators: Aching Pain Intervention(s): Limited activity within patient's tolerance    Home Living                      Prior Function            PT Goals (current goals can now be found in the care plan section) Acute  Rehab PT Goals Patient Stated Goal: To return to general daily activity without an AD PT Goal Formulation: With patient Time For Goal Achievement: 05/28/18 Potential to Achieve Goals: Good Progress towards PT goals: Progressing toward goals    Frequency    BID      PT Plan Current plan remains appropriate    Co-evaluation              AM-PAC PT "6 Clicks" Mobility   Outcome Measure  Help needed turning from your back to your side while in a flat bed without using bedrails?: None Help needed moving from lying on your back to sitting on the side of a flat bed without using bedrails?: A  Little Help needed moving to and from a bed to a chair (including a wheelchair)?: A Little Help needed standing up from a chair using your arms (e.g., wheelchair or bedside chair)?: A Little Help needed to walk in hospital room?: A Little Help needed climbing 3-5 steps with a railing? : A Little 6 Click Score: 19    End of Session Equipment Utilized During Treatment: Gait belt;Oxygen Activity Tolerance: Other (comment);Patient tolerated treatment well Patient left: with bed alarm set;in bed;with family/visitor present Nurse Communication: Mobility status(O2 sats) PT Visit Diagnosis: Unsteadiness on feet (R26.81);Muscle weakness (generalized) (M62.81)     Time: 6773-7366 PT Time Calculation (min) (ACUTE ONLY): 32 min  Charges:  $Therapeutic Activity: 23-37 mins                     Roxanne Gates, PT, DPT    Roxanne Gates 05/14/2018, 2:11 PM

## 2018-05-14 NOTE — Care Management Note (Signed)
Case Management Note  Patient Details  Name: Samantha Freeman MRN: [id number] Date of Birth: 01/01/1936  Subjective/Objective:                  Met with patient to discuss DC plan,  She lives alone and is independent She has a raised toilet and cane and walker at home Has no DME needs HH list provided to the patient per CMS.gov She chooses to use [facility], notified Jason at [facility] of choice Patient has transportation to get to the doctor Cousin Margaline will provide transportation Until patient can drive again Pharmacy [facility]  And [facility] in [place] Can afford medications Has no needs other than HH PT  Action/Plan:  HH list provided to patient per CMS.gov HH [facility] notified Jason  Expected Discharge Date:  05/13/18               Expected Discharge Plan:     In-House Referral:     Discharge planning Services     Post Acute Care Choice:    Choice offered to:     DME Arranged:    DME Agency:     HH Arranged:  PT HH Agency:     Status of Service:     If discussed at Long Length of Stay Meetings, dates discussed:    Additional Comments:  Deliliah J Gregory, RN 05/14/2018, 9:39 AM  

## 2018-05-14 NOTE — NC FL2 (Signed)
[place] LEVEL OF CARE SCREENING TOOL     IDENTIFICATION  Patient Name: Chelsy Parrales Birthdate: 1935-07-18 Sex: female Admission Date (Current Location): 05/13/2018  [place] and [place] Number:  Engineering geologist and Address:  [facility], [address] [place], [place] [address]      Provider Number: [id number]  Attending Physician Name and Address:  Corky Mull, MD  Relative Name and Phone Number:       Current Level of Care: Hospital Recommended Level of Care: [place] Prior Approval Number:    Date Approved/Denied:   PASRR Number: [id number])  Discharge Plan: SNF    Current Diagnoses: Patient Active Problem List   Diagnosis Date Noted  . Rotator cuff tear 05/13/2018    Orientation RESPIRATION BLADDER Height & Weight     Self, Time, Situation, Place  O2(2 Liters Oxygen. ) Continent Weight: 155 lb 4.8 oz (70.4 kg) Height:  5\' 4"  (162.6 cm)  BEHAVIORAL SYMPTOMS/MOOD NEUROLOGICAL BOWEL NUTRITION STATUS      Continent Diet(Diet: Heart Healthy/ Carb Modified. )  AMBULATORY STATUS COMMUNICATION OF NEEDS Skin   Extensive Assist Verbally Surgical wounds(Incision: Right Shoulder )                       Personal Care Assistance Level of Assistance  Bathing, Feeding, Dressing Bathing Assistance: Limited assistance Feeding assistance: Independent Dressing Assistance: Limited assistance     Functional Limitations Info  Sight, Hearing, Speech Sight Info: Adequate Hearing Info: Adequate Speech Info: Adequate    SPECIAL CARE FACTORS FREQUENCY  PT (By licensed PT), OT (By licensed OT)     PT Frequency: (5) OT Frequency: (5)            Contractures      Additional Factors Info  Code Status, Allergies Code Status Info: (Full Code. ) Allergies Info: (Propofol, Zolpidem, Codeine, Niacin)           Current Medications (05/14/2018):  This is the current hospital active medication  list Current Facility-Administered Medications  Medication Dose Route Frequency Provider Last Rate Last Dose  . 0.9 %  sodium chloride infusion   Intravenous Continuous Poggi, Marshall Cork, MD      . 0.9 % NaCl with KCl 20 mEq/ L  infusion   Intravenous Continuous Poggi, Marshall Cork, MD 100 mL/hr at 05/14/18 0432    . acetaminophen (TYLENOL) tablet 1,000 mg  1,000 mg Oral Q6H Poggi, Marshall Cork, MD   1,000 mg at 05/14/18 0540  . acetaminophen (TYLENOL) tablet 325-650 mg  325-650 mg Oral Q6H PRN Poggi, Marshall Cork, MD      . ALPRAZolam Duanne Moron) tablet 0.5 mg  0.5 mg Oral QHS PRN Poggi, Marshall Cork, MD      . amLODipine (NORVASC) tablet 5 mg  5 mg Oral Daily Poggi, Marshall Cork, MD      . aspirin EC tablet 81 mg  81 mg Oral QODAY Poggi, Marshall Cork, MD      . bisacodyl (DULCOLAX) suppository 10 mg  10 mg Rectal Daily PRN Poggi, Marshall Cork, MD      . bisoprolol-hydrochlorothiazide (ZIAC) 5-6.25 MG per tablet 0.5 tablet  0.5 tablet Oral Daily Poggi, Marshall Cork, MD      . calcium-vitamin D (OSCAL WITH D) 500-200 MG-UNIT per tablet   Oral Daily Poggi, Marshall Cork, MD      . cholecalciferol (VITAMIN D3) tablet 1,000 Units  1,000 Units Oral Daily Poggi, Marshall Cork,  MD      . citalopram (CELEXA) tablet 40 mg  40 mg Oral Daily Poggi, Marshall Cork, MD      . Desoximetasone 8.92 % OINT 1 application  1 application Apply externally Daily PRN Poggi, Marshall Cork, MD      . diphenhydrAMINE (BENADRYL) 12.5 MG/5ML elixir 12.5-25 mg  12.5-25 mg Oral Q4H PRN Poggi, Marshall Cork, MD      . docusate sodium (COLACE) capsule 100 mg  100 mg Oral BID Poggi, Marshall Cork, MD      . enalapril (VASOTEC) tablet 20 mg  20 mg Oral BID Poggi, Marshall Cork, MD      . fenofibrate tablet 160 mg  160 mg Oral Daily Poggi, Marshall Cork, MD      . ferrous sulfate tablet 325 mg  325 mg Oral Q breakfast Poggi, Marshall Cork, MD      . glimepiride (AMARYL) tablet 2 mg  2 mg Oral BID Poggi, Marshall Cork, MD      . hydrochlorothiazide (HYDRODIURIL) tablet 12.5 mg  12.5 mg Oral Daily Poggi, Marshall Cork, MD      . insulin aspart (novoLOG)  injection 0-15 Units  0-15 Units Subcutaneous TID WC Poggi, Marshall Cork, MD      . linagliptin (TRADJENTA) tablet 5 mg  5 mg Oral Daily Poggi, Marshall Cork, MD      . loratadine (CLARITIN) tablet 10 mg  10 mg Oral Daily Poggi, Marshall Cork, MD      . magnesium hydroxide (MILK OF MAGNESIA) suspension 30 mL  30 mL Oral Daily PRN Poggi, Marshall Cork, MD      . magnesium oxide (MAG-OX) tablet 400 mg  400 mg Oral TID Poggi, Marshall Cork, MD      . Melatonin TABS 2.5-5 mg  2.5-5 mg Oral QHS PRN Poggi, Marshall Cork, MD      . metFORMIN (GLUCOPHAGE) tablet 1,000 mg  1,000 mg Oral BID Poggi, Marshall Cork, MD      . metoCLOPramide (REGLAN) tablet 5-10 mg  5-10 mg Oral Q8H PRN Poggi, Marshall Cork, MD       Or  . metoCLOPramide (REGLAN) injection 5-10 mg  5-10 mg Intravenous Q8H PRN Poggi, Marshall Cork, MD      . multivitamin with minerals tablet 1 tablet  1 tablet Oral Daily Poggi, Marshall Cork, MD      . ondansetron (ZOFRAN) tablet 4 mg  4 mg Oral Q6H PRN Poggi, Marshall Cork, MD       Or  . ondansetron (ZOFRAN) injection 4 mg  4 mg Intravenous Q6H PRN Poggi, Marshall Cork, MD      . oxyCODONE (Oxy IR/ROXICODONE) immediate release tablet 5-10 mg  5-10 mg Oral Q3H PRN Poggi, Marshall Cork, MD      . oxyCODONE (Oxy IR/ROXICODONE) immediate release tablet 5-10 mg  5-10 mg Oral Q4H PRN Poggi, Marshall Cork, MD      . pantoprazole (PROTONIX) EC tablet 40 mg  40 mg Oral Daily Poggi, Marshall Cork, MD      . prednisoLONE acetate (PRED FORTE) 1 % ophthalmic suspension 1 drop  1 drop Both Eyes QHS Poggi, Marshall Cork, MD      . rosuvastatin (CRESTOR) tablet 5 mg  5 mg Oral Daily Poggi, Marshall Cork, MD      . Semaglutide TABS 1 tablet  1 tablet Oral QPC breakfast Poggi, Marshall Cork, MD      . sodium phosphate (FLEET) 7-19 GM/118ML enema 1 enema  1 enema Rectal  Once PRN Poggi, Marshall Cork, MD      . traMADol Veatrice Bourbon) tablet 50 mg  50 mg Oral Q6H PRN Poggi, Marshall Cork, MD      . vitamin B-12 (CYANOCOBALAMIN) tablet 500 mcg  500 mcg Oral Daily Poggi, Marshall Cork, MD         Discharge Medications: Please see discharge summary for a  list of discharge medications.  Relevant Imaging Results:  Relevant Lab Results:   Additional Information (SSN: [id number])  Zaray Gatchel, Veronia Beets, LCSW

## 2018-05-14 NOTE — Progress Notes (Signed)
Clinical Social Worker (CSW) received SNF consult. PT is recommending home health. RN case manager aware of above. Please reconsult if future social work needs arise. CSW signing off.   Amzie Sillas, LCSW [phone] 

## 2018-05-14 NOTE — Progress Notes (Signed)
PT Cancellation Note  Patient Details Name: Samantha Freeman MRN: [id number] DOB: 1935-05-13   Cancelled Treatment:    Reason Eval/Treat Not Completed: Patient at procedure or test/unavailable.  Pt currently eating lunch.  Walked with nursing earlier and O2 sats were more stable.  Pt reports feeling less "weakness" and is more focused on pain in her shoulder now.  Will re-attempt later.  Roxanne Gates, PT, DPT  Roxanne Gates 05/14/2018, 1:02 PM

## 2018-05-14 NOTE — Discharge Summary (Signed)
Physician Discharge Summary  Patient ID: Samantha Freeman MRN: [id number] DOB/AGE: 81-24-1937 83 y.o.  Admit date: 05/13/2018 Discharge date: 05/14/2018  Admission Diagnoses:  NONTRAUMATIC COMPLETE TEAR OF RIGHT ROTATOR CUFF, RIGHT ROTATOR CUFF TENDINITIS  Discharge Diagnoses: Patient Active Problem List   Diagnosis Date Noted  . Rotator cuff tear 05/13/2018    Past Medical History:  Diagnosis Date  . Anxiety   . Chronic kidney disease   . Complication of anesthesia    Propofol anaphylaxis  . Depression   . Diabetes mellitus without complication ([place])   . GERD (gastroesophageal reflux disease)   . Hypertension      Transfusion: None.   Consultants (if any):   Discharged Condition: Improved  Hospital Course: Samantha Freeman is an 83 y.o. female who was admitted 05/13/2018 with a diagnosis of a nontraumatic full-thickness rotator cuff tear and biceps tendinopathy of the right shoulder and went to the operating room on 05/13/2018 and underwent the above named procedures.    Surgeries: Procedure(s): SHOULDER ARTHROSCOPY WITH DEBRIDEMENT, DECOMPRESSION AND ROTATOR CUFF REPAIR BICEPS TENODESIS on 05/13/2018 Patient tolerated the surgery well. However following anesthesia the patient was suffering from hypoxia requiring 2L of O2 in order to stay above 90% on O2 sats.  She was admitted over night for observation.  Foot pumps applied bilaterally at 80 mm. Heels elevated on bed with rolled towels. No evidence of DVT. Negative [place]. She was able to ambulate on the first day following surgery with the nursing staff and O2 sats were stable on Room air, 93%  Patient's IV , foley and hemovac was d/c on day #2.  Implants: Two Biomet juggerKnot anchors and a signel English as a second language teacher.  She was given perioperative antibiotics:  Anti-infectives (From admission, onward)   Start     Dose/Rate Route Frequency Ordered Stop   05/13/18 1600  ceFAZolin (ANCEF) injection 2 g     2 g  Intramuscular  Once 05/13/18 1547 05/13/18 1705   05/13/18 1405  ceFAZolin (ANCEF) 2-4 GM/100ML-% IVPB    Note to Pharmacy:  Samantha Freeman   : cabinet override      05/13/18 1405 05/14/18 0229    .  She was given sequential compression devices, early ambulation for DVT prophylaxis.  She benefited maximally from the hospital stay and there were no complications.    Recent vital signs:  Vitals:   05/14/18 0933 05/14/18 1145  BP:  (!) 154/63  Pulse:  63  Resp:  16  Temp:  98.5 F (36.9 C)  SpO2: 95% 93%    Recent laboratory studies:  Lab Results  Component Value Date   HGB 12.6 05/09/2018   HGB 11.9 (L) 10/07/2012   HGB 9.5 (L) 02/21/2012   Lab Results  Component Value Date   WBC 6.5 05/09/2018   PLT 280 05/09/2018   Lab Results  Component Value Date   INR 1.0 01/28/2012   Lab Results  Component Value Date   NA 138 05/09/2018   K 4.0 05/09/2018   CL 100 05/09/2018   CO2 32 05/09/2018   BUN 23 05/09/2018   CREATININE 0.99 05/09/2018   GLUCOSE 215 (H) 05/09/2018    Discharge Medications:   Allergies as of 05/14/2018      Reactions   Propofol Anaphylaxis   Zolpidem Other (See Comments)   Hallucinations   Codeine Nausea Only   Niacin Rash      Medication List    TAKE these medications   ALPRAZolam 0.5 MG tablet Commonly  known as:  XANAX Take 0.5 mg by mouth at bedtime as needed for sleep.   amLODipine 10 MG tablet Commonly known as:  NORVASC Take 5 mg by mouth daily.   aspirin EC 81 MG tablet Take 81 mg by mouth every other day.   bisoprolol-hydrochlorothiazide 5-6.25 MG tablet Commonly known as:  ZIAC Take 0.5 tablets by mouth daily.   CALCIUM 600/VITAMIN D3 PO Take 1 tablet by mouth daily.   cetirizine 10 MG chewable tablet Commonly known as:  ZYRTEC Chew 10 mg by mouth daily.   cholecalciferol 25 MCG (1000 UT) tablet Commonly known as:  VITAMIN D3 Take 1,000 Units by mouth daily.   citalopram 40 MG tablet Commonly known as:   CELEXA Take 40 mg by mouth daily.   Desoximetasone 0.05 % Oint Apply 1 application topically daily as needed (for ear itching).   enalapril 20 MG tablet Commonly known as:  VASOTEC Take 20 mg by mouth 2 (two) times daily.   fenofibrate 145 MG tablet Commonly known as:  TRICOR Take 145 mg by mouth daily.   ferrous sulfate 325 (65 FE) MG tablet Take 325 mg by mouth daily with breakfast.   glimepiride 2 MG tablet Commonly known as:  AMARYL Take 2 mg by mouth 2 (two) times daily.   hydrochlorothiazide 12.5 MG tablet Commonly known as:  HYDRODIURIL Take 12.5 mg by mouth daily.   magnesium oxide 400 MG tablet Commonly known as:  MAG-OX Take 400 mg by mouth 3 (three) times daily.   Melatonin 5 MG Caps Take 2.5-5 mg by mouth at bedtime as needed (for sleep).   metFORMIN 1000 MG tablet Commonly known as:  GLUCOPHAGE Take 1,000 mg by mouth 2 (two) times daily.   multivitamin tablet Take 1 tablet by mouth daily. Women's Daily Multivitamin   omeprazole 20 MG capsule Commonly known as:  PRILOSEC Take 20 mg by mouth daily.   oxyCODONE 5 MG immediate release tablet Commonly known as:  ROXICODONE Take 1-2 tablets (5-10 mg total) by mouth every 4 (four) hours as needed for moderate pain or severe pain.   prednisoLONE acetate 1 % ophthalmic suspension Commonly known as:  PRED FORTE Apply 1 drop to eye at bedtime.   rosuvastatin 5 MG tablet Commonly known as:  CRESTOR Take 5 mg by mouth daily.   Semaglutide 14 MG Tabs Take 1 tablet by mouth. This is part of a clinical trial. The patient is either taking Semaglutide 14mg  tab daily OR a Placebo.   sitaGLIPtin 50 MG tablet Commonly known as:  JANUVIA Take 50 mg by mouth daily.   vitamin B-12 500 MCG tablet Commonly known as:  CYANOCOBALAMIN Take 500 mcg by mouth daily.       Diagnostic Studies: X-ray Chest Pa Or Ap  Result Date: 05/13/2018 CLINICAL DATA:  Low oxygen saturation EXAM: CHEST  1 VIEW COMPARISON:   03/25/2014 FINDINGS: Bibasilar opacity. Borderline cardiomegaly with aortic atherosclerosis. No pneumothorax. IMPRESSION: 1. Bibasilar opacity, likely atelectasis 2. Borderline cardiomegaly Electronically Signed   By: Donavan Foil M.D.   On: 05/13/2018 21:53   Mr Shoulder Right Wo Contrast  Result Date: 04/22/2018 CLINICAL DATA:  Anterior right shoulder pain and limited range of motion for 1 month. No known injury. EXAM: MRI OF THE RIGHT SHOULDER WITHOUT CONTRAST TECHNIQUE: Multiplanar, multisequence MR imaging of the shoulder was performed. No intravenous contrast was administered. COMPARISON:  None. FINDINGS: Rotator cuff: Thickening and heterogeneously increased T2 signal are consistent with rotator cuff tendinopathy. There is  a partial width, full-thickness tear of the supraspinatus measuring 1 cm from front to back. Retraction is mild at 1-2 cm. The rotator cuff otherwise appears intact. Muscles:  No atrophy or focal lesion. Biceps long head:  Intact. Acromioclavicular Joint: Moderate degenerative change is present. Type 1 acromion. No subacromial/subdeltoid bursal fluid. Glenohumeral Joint: Appears normal. Labrum:  Intact. Bones:  No fracture, contusion or worrisome lesion. Other: None. IMPRESSION: Rotator cuff tendinopathy with a 1 cm from front to back full-thickness tear of the supraspinatus with 1-2 cm of retraction. No atrophy. Moderate acromioclavicular osteoarthritis. Electronically Signed   By: Inge Rise M.D.   On: 04/22/2018 14:23    Disposition: Discharge disposition: 01-Home or Self Care        Follow-up Information    Lattie Corns, PA-C Follow up in 14 day(s).   Specialty:  Physician Assistant Why:  Electa Sniff information: [place] [phone]          Signed: Judson Roch PA-C 05/14/2018, 1:30 PM

## 2018-05-14 NOTE — Progress Notes (Signed)
Subjective: 1 Day Post-Op Procedure(s) (LRB): SHOULDER ARTHROSCOPY WITH DEBRIDEMENT, DECOMPRESSION AND ROTATOR CUFF REPAIR (Right) BICEPS TENODESIS (Right) Patient reports pain as mild.   Patient is well but is still on 2L of O2 at this time. Plan is to go Home after hospital stay. Negative for chest pain and shortness of breath Fever: no Gastrointestinal:Negative for nausea and vomiting Denies any SOB or chest pain at this time.  Objective: Vital signs in last 24 hours: Temp:  [97.7 F (36.5 C)-99.4 F (37.4 C)] 98.2 F (36.8 C) (01/22 0753) Pulse Rate:  [38-84] 64 (01/22 0753) Resp:  [12-29] 14 (01/22 0753) BP: (97-178)/(54-86) 155/84 (01/22 0753) SpO2:  [87 %-97 %] 95 % (01/22 0933) Weight:  [70.4 kg] 70.4 kg (01/21 1438)  Intake/Output from previous day:  Intake/Output Summary (Last 24 hours) at 05/14/2018 1004 Last data filed at 05/14/2018 0432 Gross per 24 hour  Intake 2248.49 ml  Output -  Net 2248.49 ml    Intake/Output this shift: No intake/output data recorded.  Labs: No results for input(s): HGB in the last 72 hours. No results for input(s): WBC, RBC, HCT, PLT in the last 72 hours. No results for input(s): NA, K, CL, CO2, BUN, CREATININE, GLUCOSE, CALCIUM in the last 72 hours. No results for input(s): LABPT, INR in the last 72 hours.   EXAM General - Patient is Alert, Appropriate and Oriented Extremity - ABD soft Sensation intact distally Intact pulses distally Dorsiflexion/Plantar flexion intact Incision: dressing C/D/I No cellulitis present Dressing/Incision - clean, dry, no drainage Motor Function - intact, moving foot and toes well on exam. Able to make a full composite fist without pain, 4/5 grip strength.  Past Medical History:  Diagnosis Date  . Anxiety   . Chronic kidney disease   . Complication of anesthesia    Propofol anaphylaxis  . Depression   . Diabetes mellitus without complication ([place])   . GERD (gastroesophageal reflux disease)    . Hypertension    Assessment/Plan: 1 Day Post-Op Procedure(s) (LRB): SHOULDER ARTHROSCOPY WITH DEBRIDEMENT, DECOMPRESSION AND ROTATOR CUFF REPAIR (Right) BICEPS TENODESIS (Right) Active Problems:   Rotator cuff tear  Estimated body mass index is 26.66 kg/m as calculated from the following:   Height as of this encounter: 5\' 4"  (1.626 m).   Weight as of this encounter: 70.4 kg. Advance diet Up with therapy D/C IV fluids when tolerating po intake  Still on 2L of O2 at this time.  O2 sats at 95%.  Denies any chest pain or SOB. Will attempt to wean off of O2 today. Plan for discharge home this afternoon based on O2 levels. Can possibly discharge home with Home O2.  DVT Prophylaxis - Foot Pumps and TED hose Non-weightbearing to the right arm.  Raquel James, PA-C [facility] 05/14/2018, 10:04 AM

## 2018-05-14 NOTE — Care Management Obs Status (Signed)
[place] NOTIFICATION   Patient Details  Name: Frimy Uffelman MRN: [id number] Date of Birth: 13-Jul-1935   Medicare Observation Status Notification Given:  Yes    Su Hilt, RN 05/14/2018, 9:50 AM

## 2018-05-14 NOTE — Progress Notes (Signed)
Patient ate breakfast on room air and oxygen sats stayed at 95. Ambulated patient around nursing station on ra. And oxygen sats dipped to 88 with quick recovery to 93 after a brief resting period. No complaints of any sort.

## 2018-05-14 NOTE — Evaluation (Signed)
Physical Therapy Evaluation Patient Details Name: Samantha Freeman MRN: [id number] DOB: 1936/01/29 Today's Date: 05/14/2018   History of Present Illness  Pt is an 83 year old female admitted s/p R rotator cuff repair (supraspinatus), mini open biceps tenodesis and debridement.  PMH includes anxiety, depression, DM, and CKD.  Clinical Impression  Pt A&O for evaluation but reporting feeling of "weakness" more so than "dizziness".  O2 sats remained in low 90's at rest and pt desat to 85% with 50 ft of ambulation, requiring O2 to be increased from 2L to 3L to return to room.  Pt able to perform bed mobility mod I and sit with good sitting balance.  She was able to stand from all surfaces without physical assist and use only her IV pole for 150 ft of ambulation.  Pt open to all education including ROM limitations at this time, HEP and management of sling.  Pt will continue to benefit from skilled PT with focus on strength, ROM functional activity with use of R UE.  Pt is appropriate for home health PT due to limitations in mobility at this time.    Follow Up Recommendations Home health PT;Supervision - Intermittent    Equipment Recommendations  None recommended by PT    Recommendations for Other Services       Precautions / Restrictions        Mobility  Bed Mobility Overal bed mobility: Modified Independent             General bed mobility comments: Increased time and HOB elevated  Transfers Overall transfer level: Needs assistance Equipment used: Rolling walker (2 wheeled) Transfers: Sit to/from Stand Sit to Stand: Supervision         General transfer comment: Pt able to stand from recliner, bed and standard height toilet seat without assistance to rise.  Ambulation/Gait Ambulation/Gait assistance: Min guard Gait Distance (Feet): 150 Feet Assistive device: IV Pole     Gait velocity interpretation: 1.31 - 2.62 ft/sec, indicative of limited community ambulator General Gait  Details: Good foot clearance and step length; pt ambulated 50 ft and reported dizziness: desat to 85% on 2L.  She required 3L to improve to 90% in order to return to her room.  Stairs            Wheelchair Mobility    Modified Rankin (Stroke Patients Only)       Balance Overall balance assessment: Mild deficits observed, not formally tested;Independent(Able to perorm functional activity without use of AD.  Pt slightly unsteady on feet at times but this appears to be due to pt's weakened state due to O2 desat.)                                           Pertinent Vitals/Pain Pain Assessment: No/denies pain    Home Living Family/patient expects to be discharged to:: Private residence Living Arrangements: Alone Available Help at Discharge: Family(Cousin and son) Type of Home: House Home Access: Stairs to enter     Home Layout: One level Home Equipment: Environmental consultant - 2 wheels;Cane - single point      Prior Function Level of Independence: Independent         Comments: Community ambulator at baseline     [employer]        Extremity/Trunk Assessment   Upper Extremity Assessment Upper Extremity Assessment: Overall WFL for tasks assessed;RUE deficits/detail  RUE Deficits / Details: Able to squeeze stress ball and perform wrist ROM. RUE: Unable to fully assess due to immobilization RUE Sensation: WNL(N/T in thumb which pt reports was present prior to surgery.) RUE Coordination: WNL    Lower Extremity Assessment Lower Extremity Assessment: Overall WFL for tasks assessed    Cervical / Trunk Assessment Cervical / Trunk Assessment: Normal  Communication   Communication: No difficulties  Cognition Arousal/Alertness: Suspect due to medications Behavior During Therapy: WFL for tasks assessed/performed Overall Cognitive Status: Within Functional Limits for tasks assessed                                 General Comments: Pt was able to  follow all directions but presented with a slight delay in response during conversation.      General Comments      Exercises Other Exercises Other Exercises: Reviewed HEP and pt was able to perform ball squeezes and wrist flexion/ext x10 each Other Exercises: adjusted and educated regarding shoulder sling placement x4 min Other Exercises: Monitoring of O2 x8 min Other Exercises: Assistance with toileting and setting up breakfast due to pt being limited in L hand movement by IV and O2 monitor. x5 min   Assessment/Plan    PT Assessment Patient needs continued PT services  PT Problem List Decreased strength;Decreased mobility;Decreased range of motion;Decreased activity tolerance;Decreased knowledge of use of DME;Cardiopulmonary status limiting activity;Impaired sensation;Pain       PT Treatment Interventions DME instruction;Functional mobility training;Balance training;Patient/family education;Gait training;Therapeutic activities;Stair training;Therapeutic exercise;Neuromuscular re-education    PT Goals (Current goals can be found in the Care Plan section)  Acute Rehab PT Goals Patient Stated Goal: To return to general daily activity without an AD PT Goal Formulation: With patient Time For Goal Achievement: 05/28/18 Potential to Achieve Goals: Good    Frequency BID   Barriers to discharge        Co-evaluation               AM-PAC PT "6 Clicks" Mobility  Outcome Measure Help needed turning from your back to your side while in a flat bed without using bedrails?: None Help needed moving from lying on your back to sitting on the side of a flat bed without using bedrails?: A Little Help needed moving to and from a bed to a chair (including a wheelchair)?: A Little Help needed standing up from a chair using your arms (e.g., wheelchair or bedside chair)?: A Little Help needed to walk in hospital room?: A Little Help needed climbing 3-5 steps with a railing? : A Little 6  Click Score: 19    End of Session Equipment Utilized During Treatment: Gait belt;Oxygen Activity Tolerance: Other (comment)(Walking distance limited by O2 desat.) Patient left: in chair;with call bell/phone within reach;with chair alarm set Nurse Communication: Mobility status(O2 sats) PT Visit Diagnosis: Unsteadiness on feet (R26.81);Muscle weakness (generalized) (M62.81)    Time: 3568-6168 PT Time Calculation (min) (ACUTE ONLY): 47 min   Charges:   PT Evaluation $PT Eval Low Complexity: 1 Low PT Treatments $Therapeutic Exercise: 8-22 mins $Therapeutic Activity: 8-22 mins        Roxanne Gates, PT, DPT   Roxanne Gates 05/14/2018, 9:02 AM

## 2018-05-15 NOTE — Anesthesia Postprocedure Evaluation (Signed)
Anesthesia Post Note  Patient: Samantha Freeman  Procedure(s) Performed: SHOULDER ARTHROSCOPY WITH DEBRIDEMENT, DECOMPRESSION AND ROTATOR CUFF REPAIR (Right ) BICEPS TENODESIS (Right )  Patient location during evaluation: PACU Anesthesia Type: General Level of consciousness: awake and alert Pain management: pain level controlled Vital Signs Assessment: post-procedure vital signs reviewed and stable Respiratory status: spontaneous breathing, nonlabored ventilation and respiratory function stable Cardiovascular status: blood pressure returned to baseline and stable Postop Assessment: no apparent nausea or vomiting Anesthetic complications: no     Last Vitals:  Vitals:   05/14/18 1145 05/14/18 1403  BP: (!) 154/63   Pulse: 63   Resp: 16   Temp: 36.9 C   SpO2: 93% 90%    Last Pain:  Vitals:   05/14/18 0933  TempSrc:   PainSc: 0-No pain                 Durenda Hurt

## 2018-12-01 DIAGNOSIS — Z8616 Personal history of COVID-19: Secondary | ICD-10-CM

## 2018-12-01 HISTORY — DX: Personal history of COVID-19: Z86.16

## 2018-12-06 ENCOUNTER — Encounter (HOSPITAL_COMMUNITY): Payer: Self-pay | Admitting: Family Medicine

## 2018-12-06 ENCOUNTER — Emergency Department
Admission: EM | Admit: 2018-12-06 | Discharge: 2018-12-06 | Disposition: A | Discharge status: Other Acute Inpatient Hospital | Payer: Medicare Other | Attending: Emergency Medicine | Admitting: Emergency Medicine

## 2018-12-06 ENCOUNTER — Inpatient Hospital Stay (HOSPITAL_COMMUNITY)
Admission: AD | Admit: 2018-12-06 | Discharge: 2018-12-11 | DRG: 177 | Disposition: A | Discharge status: Home Health | Payer: Medicare Other | Source: Other Acute Inpatient Hospital | Attending: Internal Medicine | Admitting: Internal Medicine

## 2018-12-06 ENCOUNTER — Emergency Department: Payer: Medicare Other

## 2018-12-06 DIAGNOSIS — F419 Anxiety disorder, unspecified: Secondary | ICD-10-CM | POA: Diagnosis present

## 2018-12-06 DIAGNOSIS — I10 Essential (primary) hypertension: Secondary | ICD-10-CM

## 2018-12-06 DIAGNOSIS — Z947 Corneal transplant status: Secondary | ICD-10-CM

## 2018-12-06 DIAGNOSIS — K219 Gastro-esophageal reflux disease without esophagitis: Secondary | ICD-10-CM | POA: Diagnosis present

## 2018-12-06 DIAGNOSIS — Z888 Allergy status to other drugs, medicaments and biological substances status: Secondary | ICD-10-CM

## 2018-12-06 DIAGNOSIS — J9601 Acute respiratory failure with hypoxia: Secondary | ICD-10-CM | POA: Diagnosis present

## 2018-12-06 DIAGNOSIS — Z803 Family history of malignant neoplasm of breast: Secondary | ICD-10-CM

## 2018-12-06 DIAGNOSIS — E785 Hyperlipidemia, unspecified: Secondary | ICD-10-CM | POA: Diagnosis present

## 2018-12-06 DIAGNOSIS — Z66 Do not resuscitate: Secondary | ICD-10-CM | POA: Diagnosis present

## 2018-12-06 DIAGNOSIS — N182 Chronic kidney disease, stage 2 (mild): Secondary | ICD-10-CM | POA: Diagnosis present

## 2018-12-06 DIAGNOSIS — E119 Type 2 diabetes mellitus without complications: Secondary | ICD-10-CM | POA: Diagnosis not present

## 2018-12-06 DIAGNOSIS — Z885 Allergy status to narcotic agent status: Secondary | ICD-10-CM | POA: Diagnosis not present

## 2018-12-06 DIAGNOSIS — E1165 Type 2 diabetes mellitus with hyperglycemia: Secondary | ICD-10-CM | POA: Diagnosis present

## 2018-12-06 DIAGNOSIS — R0902 Hypoxemia: Secondary | ICD-10-CM

## 2018-12-06 DIAGNOSIS — N189 Chronic kidney disease, unspecified: Secondary | ICD-10-CM

## 2018-12-06 DIAGNOSIS — J1289 Other viral pneumonia: Secondary | ICD-10-CM | POA: Diagnosis present

## 2018-12-06 DIAGNOSIS — J189 Pneumonia, unspecified organism: Secondary | ICD-10-CM | POA: Diagnosis present

## 2018-12-06 DIAGNOSIS — Z79891 Long term (current) use of opiate analgesic: Secondary | ICD-10-CM | POA: Diagnosis not present

## 2018-12-06 DIAGNOSIS — J129 Viral pneumonia, unspecified: Secondary | ICD-10-CM

## 2018-12-06 DIAGNOSIS — J1282 Pneumonia due to coronavirus disease 2019: Secondary | ICD-10-CM | POA: Diagnosis present

## 2018-12-06 DIAGNOSIS — I129 Hypertensive chronic kidney disease with stage 1 through stage 4 chronic kidney disease, or unspecified chronic kidney disease: Secondary | ICD-10-CM | POA: Diagnosis present

## 2018-12-06 DIAGNOSIS — U071 COVID-19: Secondary | ICD-10-CM

## 2018-12-06 DIAGNOSIS — E860 Dehydration: Secondary | ICD-10-CM | POA: Diagnosis present

## 2018-12-06 DIAGNOSIS — E1122 Type 2 diabetes mellitus with diabetic chronic kidney disease: Secondary | ICD-10-CM | POA: Diagnosis present

## 2018-12-06 DIAGNOSIS — Z79899 Other long term (current) drug therapy: Secondary | ICD-10-CM | POA: Insufficient documentation

## 2018-12-06 DIAGNOSIS — Z7984 Long term (current) use of oral hypoglycemic drugs: Secondary | ICD-10-CM

## 2018-12-06 DIAGNOSIS — J069 Acute upper respiratory infection, unspecified: Secondary | ICD-10-CM | POA: Diagnosis present

## 2018-12-06 DIAGNOSIS — N1831 Chronic kidney disease, stage 3a: Secondary | ICD-10-CM

## 2018-12-06 DIAGNOSIS — Z7982 Long term (current) use of aspirin: Secondary | ICD-10-CM | POA: Insufficient documentation

## 2018-12-06 DIAGNOSIS — Z90710 Acquired absence of both cervix and uterus: Secondary | ICD-10-CM | POA: Diagnosis not present

## 2018-12-06 DIAGNOSIS — F329 Major depressive disorder, single episode, unspecified: Secondary | ICD-10-CM | POA: Diagnosis present

## 2018-12-06 DIAGNOSIS — R0602 Shortness of breath: Secondary | ICD-10-CM | POA: Diagnosis present

## 2018-12-06 LAB — CBC WITH DIFFERENTIAL/PLATELET
Abs Immature Granulocytes: 0.01 10*3/uL (ref 0.00–0.07)
Basophils Absolute: 0 10*3/uL (ref 0.0–0.1)
Basophils Relative: 0 %
Eosinophils Absolute: 0 10*3/uL (ref 0.0–0.5)
Eosinophils Relative: 0 %
HCT: 35.9 % — ABNORMAL LOW (ref 36.0–46.0)
Hemoglobin: 12.2 g/dL (ref 12.0–15.0)
Immature Granulocytes: 0 %
Lymphocytes Relative: 26 %
Lymphs Abs: 1.1 10*3/uL (ref 0.7–4.0)
MCH: 30.7 pg (ref 26.0–34.0)
MCHC: 34 g/dL (ref 30.0–36.0)
MCV: 90.2 fL (ref 80.0–100.0)
Monocytes Absolute: 0.3 10*3/uL (ref 0.1–1.0)
Monocytes Relative: 8 %
Neutro Abs: 2.8 10*3/uL (ref 1.7–7.7)
Neutrophils Relative %: 66 %
Platelets: 166 10*3/uL (ref 150–400)
RBC: 3.98 MIL/uL (ref 3.87–5.11)
RDW: 12.9 % (ref 11.5–15.5)
WBC: 4.2 10*3/uL (ref 4.0–10.5)
nRBC: 0 % (ref 0.0–0.2)

## 2018-12-06 LAB — BASIC METABOLIC PANEL
Anion gap: 12 (ref 5–15)
BUN: 20 mg/dL (ref 8–23)
CO2: 24 mmol/L (ref 22–32)
Calcium: 8.2 mg/dL — ABNORMAL LOW (ref 8.9–10.3)
Chloride: 95 mmol/L — ABNORMAL LOW (ref 98–111)
Creatinine, Ser: 1.02 mg/dL — ABNORMAL HIGH (ref 0.44–1.00)
GFR calc Af Amer: 60 mL/min — ABNORMAL LOW (ref >60)
GFR calc non Af Amer: 52 mL/min — ABNORMAL LOW (ref >60)
Glucose, Bld: 218 mg/dL — ABNORMAL HIGH (ref 70–99)
Potassium: 3.8 mmol/L (ref 3.5–5.1)
Sodium: 131 mmol/L — ABNORMAL LOW (ref 135–145)

## 2018-12-06 LAB — HEPATIC FUNCTION PANEL
ALT: 25 U/L (ref 0–44)
AST: 37 U/L (ref 15–41)
Albumin: 3.4 g/dL — ABNORMAL LOW (ref 3.5–5.0)
Alkaline Phosphatase: 23 U/L — ABNORMAL LOW (ref 38–126)
Bilirubin, Direct: 0.1 mg/dL (ref 0.0–0.2)
Indirect Bilirubin: 0.5 mg/dL (ref 0.3–0.9)
Total Bilirubin: 0.6 mg/dL (ref 0.3–1.2)
Total Protein: 6.7 g/dL (ref 6.5–8.1)

## 2018-12-06 LAB — FERRITIN: Ferritin: 220 ng/mL (ref 11–307)

## 2018-12-06 LAB — FIBRIN DERIVATIVES D-DIMER (ARMC ONLY): Fibrin derivatives D-dimer (ARMC): 754.93 ng/mL (FEU) — ABNORMAL HIGH (ref 0.00–499.00)

## 2018-12-06 LAB — C-REACTIVE PROTEIN: CRP: 11.1 mg/dL — ABNORMAL HIGH (ref <1.0)

## 2018-12-06 LAB — TROPONIN I (HIGH SENSITIVITY)
Troponin I (High Sensitivity): 27 ng/L — ABNORMAL HIGH (ref <18)
Troponin I (High Sensitivity): 24 ng/L — ABNORMAL HIGH (ref <18)

## 2018-12-06 LAB — BRAIN NATRIURETIC PEPTIDE: B Natriuretic Peptide: 266 pg/mL — ABNORMAL HIGH (ref 0.0–100.0)

## 2018-12-06 MED ORDER — SODIUM CHLORIDE 0.9 % IV SOLN
1.0000 g | Freq: Once | INTRAVENOUS | Status: AC
  Administered 2018-12-06: 1 g via INTRAVENOUS
  Filled 2018-12-06: qty 10

## 2018-12-06 MED ORDER — ASPIRIN EC 81 MG PO TBEC
81.0000 mg | DELAYED_RELEASE_TABLET | ORAL | Status: DC
  Administered 2018-12-07 – 2018-12-11 (×4): 81 mg via ORAL
  Filled 2018-12-06 (×4): qty 1

## 2018-12-06 MED ORDER — SODIUM CHLORIDE 0.9 % IV SOLN: 250.0000 mL | INTRAVENOUS | Status: DC | PRN

## 2018-12-06 MED ORDER — DEXAMETHASONE SODIUM PHOSPHATE 10 MG/ML IJ SOLN: 6.0000 mg | INTRAMUSCULAR | Status: DC

## 2018-12-06 MED ORDER — VITAMIN C 500 MG PO TABS
500.0000 mg | ORAL_TABLET | Freq: Every day | ORAL | Status: DC
  Administered 2018-12-06 – 2018-12-11 (×6): 500 mg via ORAL
  Filled 2018-12-06 (×6): qty 1

## 2018-12-06 MED ORDER — INSULIN ASPART 100 UNIT/ML ~~LOC~~ SOLN
0.0000 [IU] | Freq: Three times a day (TID) | SUBCUTANEOUS | Status: DC
  Administered 2018-12-07: 08:00:00 1 [IU] via SUBCUTANEOUS

## 2018-12-06 MED ORDER — ROSUVASTATIN CALCIUM 5 MG PO TABS
5.0000 mg | ORAL_TABLET | Freq: Every day | ORAL | Status: DC
  Administered 2018-12-07 – 2018-12-11 (×5): 5 mg via ORAL
  Filled 2018-12-06 (×5): qty 1

## 2018-12-06 MED ORDER — ZINC SULFATE 220 (50 ZN) MG PO CAPS: 220.0000 mg | ORAL_CAPSULE | Freq: Every day | ORAL | Status: DC

## 2018-12-06 MED ORDER — SODIUM CHLORIDE 0.9% FLUSH
3.0000 mL | Freq: Two times a day (BID) | INTRAVENOUS | Status: DC
  Administered 2018-12-06: 23:00:00 3 mL via INTRAVENOUS

## 2018-12-06 MED ORDER — INSULIN ASPART 100 UNIT/ML ~~LOC~~ SOLN
0.0000 [IU] | Freq: Every day | SUBCUTANEOUS | Status: DC
  Administered 2018-12-06: 23:00:00 5 [IU] via SUBCUTANEOUS

## 2018-12-06 MED ORDER — SODIUM CHLORIDE 0.9 % IV SOLN
200.0000 mg | Freq: Once | INTRAVENOUS | Status: AC
  Administered 2018-12-06: 23:00:00 200 mg via INTRAVENOUS
  Filled 2018-12-06: qty 40

## 2018-12-06 MED ORDER — ACETAMINOPHEN 325 MG PO TABS
650.0000 mg | ORAL_TABLET | Freq: Four times a day (QID) | ORAL | Status: DC | PRN
  Administered 2018-12-07 – 2018-12-10 (×4): 650 mg via ORAL
  Filled 2018-12-06 (×4): qty 2

## 2018-12-06 MED ORDER — AMLODIPINE BESYLATE 5 MG PO TABS: 5.0000 mg | ORAL_TABLET | Freq: Every day | ORAL | Status: DC

## 2018-12-06 MED ORDER — ACETAMINOPHEN 500 MG PO TABS
1000.0000 mg | ORAL_TABLET | Freq: Once | ORAL | Status: AC
  Administered 2018-12-06: 1000 mg via ORAL
  Filled 2018-12-06: qty 2

## 2018-12-06 MED ORDER — ENOXAPARIN SODIUM 40 MG/0.4ML ~~LOC~~ SOLN
40.0000 mg | SUBCUTANEOUS | Status: DC
  Filled 2018-12-06: qty 0.4

## 2018-12-06 MED ORDER — SODIUM CHLORIDE 0.9% FLUSH: 3.0000 mL | Freq: Two times a day (BID) | INTRAVENOUS | Status: DC

## 2018-12-06 MED ORDER — SODIUM CHLORIDE 0.9 % IV SOLN
100.0000 mg | INTRAVENOUS | Status: AC
  Administered 2018-12-07 – 2018-12-10 (×4): 100 mg via INTRAVENOUS
  Filled 2018-12-06 (×4): qty 20

## 2018-12-06 MED ORDER — VITAMIN C 500 MG PO TABS: 500.0000 mg | ORAL_TABLET | Freq: Every day | ORAL | Status: DC

## 2018-12-06 MED ORDER — ONDANSETRON HCL 4 MG/2ML IJ SOLN: 4.0000 mg | Freq: Four times a day (QID) | INTRAMUSCULAR | Status: DC | PRN

## 2018-12-06 MED ORDER — MELATONIN 5 MG PO TABS
2.5000 mg | ORAL_TABLET | Freq: Every evening | ORAL | Status: DC | PRN
  Administered 2018-12-06: 23:00:00 2.5 mg via ORAL
  Filled 2018-12-06 (×2): qty 0.5

## 2018-12-06 MED ORDER — ALPRAZOLAM 0.5 MG PO TABS
0.5000 mg | ORAL_TABLET | Freq: Every evening | ORAL | Status: DC | PRN
  Administered 2018-12-08 – 2018-12-10 (×3): 0.5 mg via ORAL
  Filled 2018-12-06 (×3): qty 1

## 2018-12-06 MED ORDER — SODIUM CHLORIDE 0.9 % IV SOLN
500.0000 mg | Freq: Once | INTRAVENOUS | Status: AC
  Administered 2018-12-06: 500 mg via INTRAVENOUS
  Filled 2018-12-06: qty 500

## 2018-12-06 MED ORDER — ZINC SULFATE 220 (50 ZN) MG PO CAPS
220.0000 mg | ORAL_CAPSULE | Freq: Every day | ORAL | Status: DC
  Administered 2018-12-06 – 2018-12-11 (×6): 220 mg via ORAL
  Filled 2018-12-06 (×6): qty 1

## 2018-12-06 MED ORDER — ENOXAPARIN SODIUM 40 MG/0.4ML ~~LOC~~ SOLN
40.0000 mg | SUBCUTANEOUS | Status: DC
  Administered 2018-12-06: 23:00:00 40 mg via SUBCUTANEOUS

## 2018-12-06 MED ORDER — ONDANSETRON HCL 4 MG PO TABS: 4.0000 mg | ORAL_TABLET | Freq: Four times a day (QID) | ORAL | Status: DC | PRN

## 2018-12-06 MED ORDER — SODIUM CHLORIDE 0.9% FLUSH: 3.0000 mL | INTRAVENOUS | Status: DC | PRN

## 2018-12-06 MED ORDER — DEXAMETHASONE SODIUM PHOSPHATE 10 MG/ML IJ SOLN
10.0000 mg | Freq: Once | INTRAMUSCULAR | Status: AC
  Administered 2018-12-06: 10 mg via INTRAVENOUS
  Filled 2018-12-06: qty 1

## 2018-12-06 MED ORDER — BISOPROLOL-HYDROCHLOROTHIAZIDE 5-6.25 MG PO TABS
0.5000 | ORAL_TABLET | Freq: Every day | ORAL | Status: DC
  Filled 2018-12-06 (×3): qty 0.5

## 2018-12-06 NOTE — ED Notes (Signed)
Assisted pt to use the bathroom and get repositioned in bed.

## 2018-12-06 NOTE — Progress Notes (Signed)
Pharmacy Note - Remdesivir Dosing  O:  ALT: 25 CXR: New densities in both lungs, findings c/w PNA SpO2: 83% initially, now 96% 4L [place]   A/P:  Patient meets criteria for remdesivir.  Begin remdesivir 200 mg IV x 1, followed by 100 mg IV daily x 4 days  Monitor ALT, clinical progress  Peggyann Juba, PharmD, [place] [phone] 12/06/2018 8:15 PM

## 2018-12-06 NOTE — ED Triage Notes (Signed)
PT arrived via EMS from home with complaints of SOB and difficulty breathing. Pt alert and oriented x 4. PT states loss of taste. PT was diagnosed as COVID positive on last Monday. EMS states pt had fever of 102.1 on arrival. EMS stated pt O2sat was 85% on room air. Placed pt on 2L nasal cannula bringing O2sat to 89%. Pt not normally on oxygen.

## 2018-12-06 NOTE — ED Notes (Signed)
Pt ambulated to bathroom with one person assit, gait is unsteady

## 2018-12-06 NOTE — Progress Notes (Signed)
Called primary family member (son Laverna Peace) to let him know that she arrived safely to [facility] and was settled in for the night.  His wife answered and said that Laverna Peace was already asleep. She asked if her mother in law had arrived to our hospital. I told her that she did and was settled in for the night. She thanked me and said that she would pass that along to her husband when he woke.

## 2018-12-06 NOTE — ED Notes (Signed)
EMTALA reviewed by this RN.  

## 2018-12-06 NOTE — ED Provider Notes (Signed)
[facility] Emergency Department Provider Note   ____________________________________________   First MD Initiated Contact with Patient 12/06/18 1027     (approximate)  I have reviewed the triage vital signs and the nursing notes.   HISTORY  Chief Complaint Shortness of Breath   HPI Samantha Freeman is a 83 y.o. female with past medical history of hypertension, diabetes, and chronic kidney disease who presents to the ED complaining of shortness of breath.  Patient reports that she for started feeling bad in early August, was exposed to her son around that time who works for medical transport and tested positive for COVID-19.  She had a positive COVID-19 test on August 10 and states she has been feeling progressively worse since then.  She describes persistent shortness of breath as well as a loss of taste and appetite.  She has had nonbloody diarrhea, but denies any abdominal pain or vomiting.  She spoke with the health department earlier today and EMS was dispatched to her house, where they found her O2 sat to be in the low 80s on room air.  This subsequently improved with 4 L nasal cannula by EMS.        Past Medical History:  Diagnosis Date  . Anxiety   . Chronic kidney disease   . Complication of anesthesia    Propofol anaphylaxis  . Depression   . Diabetes mellitus without complication ([place])   . GERD (gastroesophageal reflux disease)   . Hypertension     Patient Active Problem List   Diagnosis Date Noted  . Rotator cuff tear 05/13/2018    Past Surgical History:  Procedure Laterality Date  . ABDOMINAL HYSTERECTOMY    . APPENDECTOMY    . BICEPT TENODESIS Right 05/13/2018   Procedure: BICEPS TENODESIS;  Surgeon: Corky Mull, MD;  Location: [facility] ORS;  Service: Orthopedics;  Laterality: Right;  . CARDIAC CATHETERIZATION    . COLONOSCOPY    . 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 EYE SURGERY Right 09/2013   Corneal transplant  .  JOINT REPLACEMENT Right 2015   knee  . SHOULDER ARTHROSCOPY WITH ROTATOR CUFF REPAIR Right 05/13/2018   Procedure: SHOULDER ARTHROSCOPY WITH DEBRIDEMENT, DECOMPRESSION AND ROTATOR CUFF REPAIR;  Surgeon: Corky Mull, MD;  Location: [facility] ORS;  Service: Orthopedics;  Laterality: Right;  . TONSILLECTOMY      Prior to Admission medications   Medication Sig Start Date End Date Taking? Authorizing Provider  ALPRAZolam Duanne Moron) 0.5 MG tablet Take 0.5 mg by mouth at bedtime as needed for sleep.  02/04/18   [provider]  amLODipine (NORVASC) 10 MG tablet Take 5 mg by mouth daily.    [provider]  aspirin EC 81 MG tablet Take 81 mg by mouth every other day.    [provider]  bisoprolol-hydrochlorothiazide (ZIAC) 5-6.25 MG tablet Take 0.5 tablets by mouth daily.    [provider]  Calcium Carb-Cholecalciferol (CALCIUM 600/VITAMIN D3 PO) Take 1 tablet by mouth daily.    [provider]  cetirizine (ZYRTEC) 10 MG chewable tablet Chew 10 mg by mouth daily.    [provider]  cholecalciferol (VITAMIN D3) 25 MCG (1000 UT) tablet Take 1,000 Units by mouth daily.    [provider]  citalopram (CELEXA) 40 MG tablet Take 40 mg by mouth daily.    [provider]  Desoximetasone 0.05 % OINT Apply 1 application topically daily as needed (for ear itching).    [provider]  enalapril (VASOTEC) 20 MG tablet Take 20 mg by mouth 2 (two) times daily.    [provider]  fenofibrate (TRICOR) 145 MG tablet Take 145 mg by mouth daily.    [provider]  ferrous sulfate 325 (65 FE) MG tablet Take 325 mg by mouth daily with breakfast.    [provider]  glimepiride (AMARYL) 2 MG tablet Take 2 mg by mouth 2 (two) times daily.    [provider]  hydrochlorothiazide (HYDRODIURIL) 12.5 MG tablet Take 12.5 mg by mouth daily.    [provider]  magnesium oxide (MAG-OX) 400 MG tablet Take 400  mg by mouth 3 (three) times daily.    [provider]  Melatonin 5 MG CAPS Take 2.5-5 mg by mouth at bedtime as needed (for sleep).    [provider]  metFORMIN (GLUCOPHAGE) 1000 MG tablet Take 1,000 mg by mouth 2 (two) times daily. 11/04/17   [provider]  Multiple Vitamin (MULTIVITAMIN) tablet Take 1 tablet by mouth daily. Women's Daily Multivitamin    [provider]  omeprazole (PRILOSEC) 20 MG capsule Take 20 mg by mouth daily. 03/06/18   [provider]  oxyCODONE (ROXICODONE) 5 MG immediate release tablet Take 1-2 tablets (5-10 mg total) by mouth every 4 (four) hours as needed for moderate pain or severe pain. 05/13/18   Poggi, Marshall Cork, MD  prednisoLONE acetate (PRED FORTE) 1 % ophthalmic suspension Apply 1 drop to eye at bedtime. 10/04/17   [provider]  rosuvastatin (CRESTOR) 5 MG tablet Take 5 mg by mouth daily. 02/04/18   [provider]  Semaglutide 14 MG TABS Take 1 tablet by mouth. This is part of a clinical trial. The patient is either taking Semaglutide 14mg  tab daily OR a Placebo.    [provider]  sitaGLIPtin (JANUVIA) 50 MG tablet Take 50 mg by mouth daily. 10/23/17 10/23/18  [provider]  vitamin B-12 (CYANOCOBALAMIN) 500 MCG tablet Take 500 mcg by mouth daily.    [provider]    Allergies Propofol, Zolpidem, Codeine, and Niacin  Family History  Problem Relation Age of Onset  . Breast cancer Paternal Aunt     Social History Social History   Tobacco Use  . Smoking status: Never Smoker  . Smokeless tobacco: Never Used  Substance Use Topics  . Alcohol use: Never    Frequency: Never  . Drug use: Not on file    Review of Systems  Constitutional: Positive for fevers and chills. Eyes: No visual changes. ENT: No sore throat. Cardiovascular: Denies chest pain. Respiratory: Positive for shortness of breath. Gastrointestinal: No abdominal pain.  No nausea, no vomiting.   Positive for diarrhea.  No constipation. Genitourinary: Negative for dysuria. Musculoskeletal: Negative for back pain. Skin: Negative for rash. Neurological: Negative for headaches, focal weakness or numbness.  ____________________________________________   PHYSICAL EXAM:  VITAL SIGNS: ED Triage Vitals  Enc Vitals Group     BP      Pulse      Resp      Temp      Temp src      SpO2      Weight      Height      Head Circumference      Peak Flow      Pain Score      Pain Loc      Pain Edu?      Excl. in [place]?  Constitutional: Alert and oriented. Eyes: Conjunctivae are normal. Head: Atraumatic. Nose: No congestion/rhinnorhea. Mouth/Throat: Mucous membranes are moist. Neck: Normal ROM Cardiovascular: Normal rate, regular rhythm. Grossly normal heart sounds. Respiratory: Mild respiratory distress.  No retractions. Lungs CTAB. Gastrointestinal: Soft and nontender. No distention. Genitourinary: deferred Musculoskeletal: No lower extremity tenderness nor edema. Neurologic:  Normal speech and language. No gross focal neurologic deficits are appreciated. Skin:  Skin is warm, dry and intact. No rash noted. Psychiatric: Mood and affect are normal. Speech and behavior are normal.  ____________________________________________   LABS (all labs ordered are listed, but only abnormal results are displayed)  Labs Reviewed  CBC WITH DIFFERENTIAL/PLATELET - Abnormal; Notable for the following components:      Result Value   HCT 35.9 (*)    All other components within normal limits  BASIC METABOLIC PANEL - Abnormal; Notable for the following components:   Sodium 131 (*)    Chloride 95 (*)    Glucose, Bld 218 (*)    Creatinine, Ser 1.02 (*)    Calcium 8.2 (*)    GFR calc non Af Amer 52 (*)    GFR calc Af Amer 60 (*)    All other components within normal limits  BRAIN NATRIURETIC PEPTIDE - Abnormal; Notable for the following components:   B Natriuretic Peptide 266.0 (*)     All other components within normal limits  HEPATIC FUNCTION PANEL - Abnormal; Notable for the following components:   Albumin 3.4 (*)    Alkaline Phosphatase 23 (*)    All other components within normal limits  FIBRIN DERIVATIVES D-DIMER ([facility] ONLY) - Abnormal; Notable for the following components:   Fibrin derivatives D-dimer ([facility]) 754.93 (*)    All other components within normal limits  TROPONIN I (HIGH SENSITIVITY) - Abnormal; Notable for the following components:   Troponin I (High Sensitivity) 27 (*)    All other components within normal limits  TROPONIN I (HIGH SENSITIVITY) - Abnormal; Notable for the following components:   Troponin I (High Sensitivity) 24 (*)    All other components within normal limits  FERRITIN  C-REACTIVE PROTEIN   ____________________________________________  EKG  ED ECG REPORT I, Blake Divine, the attending physician, personally viewed and interpreted this ECG.   Date: 12/06/2018  EKG Time: 10:41  Rate: 70  Rhythm: normal sinus rhythm  Axis: LAD  Intervals:none  ST&T Change: None    PROCEDURES  Procedure(s) performed (including Critical Care):  .Critical Care Performed by: Blake Divine, MD Authorized by: Blake Divine, MD   Critical care provider statement:    Critical care time (minutes):  45   Critical care time was exclusive of:  Separately billable procedures and treating other patients and teaching time   Critical care was necessary to treat or prevent imminent or life-threatening deterioration of the following conditions:  Respiratory failure   Critical care was time spent personally by me on the following activities:  Discussions with consultants, evaluation of patient's response to treatment, examination of patient, ordering and performing treatments and interventions, ordering and review of laboratory studies, ordering and review of radiographic studies, pulse oximetry, re-evaluation of patient's condition, obtaining history  from patient or surrogate and review of old charts   I assumed direction of critical care for this patient from another provider in my specialty: no       ____________________________________________   INITIAL IMPRESSION / ASSESSMENT AND PLAN / ED COURSE       83 year old female with recent positive COVID-19 testing presents  to the ED with worsening shortness of breath.  Noted to be hypoxic prior to arrival, now with O2 sats of approximately 95% on 4 L nasal cannula.  This all appears related to COVID-19 as she is febrile, low suspicion for ACS or PE.  She is in no obvious distress, no significant tachypnea and no tachycardia.  Will check EKG, labs, chest x-ray.  Anticipate transfer for COVID-19 related admission.  EKG without acute ischemic changes, troponin mildly elevated but do not suspect ACS given elevation likely related to patient's chronic kidney disease.  Remainder of labs unremarkable.  Chest x-ray most consistent with viral pneumonia, however will cover for community-acquired.  Case discussed with [facility] hospitalist, Dr. Pollie Meyer, who accepts patient in transfer.    ____________________________________________   FINAL CLINICAL IMPRESSION(S) / ED DIAGNOSES  Final diagnoses:  COVID-19  Viral pneumonia  Hypoxia     ED Discharge Orders    None       Note:  This document was prepared using Dragon voice recognition software and may include unintentional dictation errors.   Blake Divine, MD 12/06/18 1538

## 2018-12-06 NOTE — H&P (Signed)
History and Physical    [name] GEZ:[id number] DOB: [birth year] DOA: 12/06/2018  PCP: Idelle Crouch, MD  Patient coming from:  [place]  Chief Complaint:  sob  HPI: Samantha Freeman is a 83 y.o. female with medical history significant of ckd, htn, dm dx with covid 8/10 presents at [facility] ED with worsening sob.  No n/v/ but some diarrhea, no abd pain.  Has had loss of taste and smell.  No le edema or swelling.  No cp.  Ems found to have o2 sats low in the 80s on RA.  Referred for admission for covid infection with bilateral pna.  pts son who is a truck driver was also recently sick with covid and lived with her.  Review of Systems: As per HPI otherwise 10 point review of systems negative.   Past Medical History:  Diagnosis Date  . Anxiety   . Chronic kidney disease   . Complication of anesthesia    Propofol anaphylaxis  . Depression   . Diabetes mellitus without complication ([place])   . GERD (gastroesophageal reflux disease)   . Hypertension     Past Surgical History:  Procedure Laterality Date  . ABDOMINAL HYSTERECTOMY    . APPENDECTOMY    . BICEPT TENODESIS Right 05/13/2018   Procedure: BICEPS TENODESIS;  Surgeon: Corky Mull, MD;  Location: [facility] ORS;  Service: Orthopedics;  Laterality: Right;  . CARDIAC CATHETERIZATION    . COLONOSCOPY    . 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 EYE SURGERY Right 09/2013   Corneal transplant  . JOINT REPLACEMENT Right 2015   knee  . SHOULDER ARTHROSCOPY WITH ROTATOR CUFF REPAIR Right 05/13/2018   Procedure: SHOULDER ARTHROSCOPY WITH DEBRIDEMENT, DECOMPRESSION AND ROTATOR CUFF REPAIR;  Surgeon: Corky Mull, MD;  Location: [facility] ORS;  Service: Orthopedics;  Laterality: Right;  . TONSILLECTOMY       reports that she has never smoked. She has never used smokeless tobacco. She reports that she does not drink alcohol. No history on file for drug.  Allergies  Allergen Reactions  . Propofol Anaphylaxis  . Zolpidem Other  (See Comments)    Hallucinations  . Codeine Nausea Only  . Niacin Rash    Family History  Problem Relation Age of Onset  . Breast cancer Paternal Aunt     Prior to Admission medications   Medication Sig Start Date End Date Taking? Authorizing Provider  ALPRAZolam Duanne Moron) 0.5 MG tablet Take 0.5 mg by mouth at bedtime as needed for sleep.  02/04/18   [provider]  amLODipine (NORVASC) 10 MG tablet Take 5 mg by mouth daily.    [provider]  aspirin EC 81 MG tablet Take 81 mg by mouth every other day.    [provider]  bisoprolol-hydrochlorothiazide (ZIAC) 5-6.25 MG tablet Take 0.5 tablets by mouth daily.    [provider]  Calcium Carb-Cholecalciferol (CALCIUM 600/VITAMIN D3 PO) Take 1 tablet by mouth daily.    [provider]  cetirizine (ZYRTEC) 10 MG chewable tablet Chew 10 mg by mouth daily.    [provider]  cholecalciferol (VITAMIN D3) 25 MCG (1000 UT) tablet Take 1,000 Units by mouth daily.    [provider]  citalopram (CELEXA) 40 MG tablet Take 40 mg by mouth daily.    [provider]  Desoximetasone 0.05 % OINT Apply 1 application topically daily as needed (for ear itching).    [provider]  enalapril (VASOTEC) 20 MG tablet  Take 20 mg by mouth 2 (two) times daily.    [provider]  fenofibrate (TRICOR) 145 MG tablet Take 145 mg by mouth daily.    [provider]  ferrous sulfate 325 (65 FE) MG tablet Take 325 mg by mouth daily with breakfast.    [provider]  glimepiride (AMARYL) 2 MG tablet Take 2 mg by mouth 2 (two) times daily.    [provider]  hydrochlorothiazide (HYDRODIURIL) 12.5 MG tablet Take 12.5 mg by mouth daily.    [provider]  magnesium oxide (MAG-OX) 400 MG tablet Take 400 mg by mouth 3 (three) times daily.    [provider]  Melatonin 5 MG CAPS Take 2.5-5 mg by mouth at bedtime as needed (for sleep).     [provider]  metFORMIN (GLUCOPHAGE) 1000 MG tablet Take 1,000 mg by mouth 2 (two) times daily. 11/04/17   [provider]  Multiple Vitamin (MULTIVITAMIN) tablet Take 1 tablet by mouth daily. Women's Daily Multivitamin    [provider]  omeprazole (PRILOSEC) 20 MG capsule Take 20 mg by mouth daily. 03/06/18   [provider]  oxyCODONE (ROXICODONE) 5 MG immediate release tablet Take 1-2 tablets (5-10 mg total) by mouth every 4 (four) hours as needed for moderate pain or severe pain. 05/13/18   Poggi, Marshall Cork, MD  prednisoLONE acetate (PRED FORTE) 1 % ophthalmic suspension Apply 1 drop to eye at bedtime. 10/04/17   [provider]  rosuvastatin (CRESTOR) 5 MG tablet Take 5 mg by mouth daily. 02/04/18   [provider]  Semaglutide 14 MG TABS Take 1 tablet by mouth. This is part of a clinical trial. The patient is either taking Semaglutide 14mg  tab daily OR a Placebo.    [provider]  sitaGLIPtin (JANUVIA) 50 MG tablet Take 50 mg by mouth daily. 10/23/17 10/23/18  [provider]  vitamin B-12 (CYANOCOBALAMIN) 500 MCG tablet Take 500 mcg by mouth daily.    [provider]    Physical Exam: Vitals:   12/06/18 1942  BP: 140/68  Pulse: (!) 57  Resp: (!) 24  Temp: 98.8 F (37.1 C)  TempSrc: Oral  SpO2: 94%      Constitutional: NAD, calm, comfortable Vitals:   12/06/18 1942  BP: 140/68  Pulse: (!) 57  Resp: (!) 24  Temp: 98.8 F (37.1 C)  TempSrc: Oral  SpO2: 94%   Eyes: PERRL, lids and conjunctivae normal ENMT: Mucous membranes are moist. Posterior pharynx clear of any exudate or lesions.Normal dentition.  Neck: normal, supple, no masses, no thyromegaly Respiratory: clear to auscultation bilaterally, no wheezing, no crackles. Normal respiratory effort. No accessory muscle use.  Cardiovascular: Regular rate and rhythm, no murmurs / rubs / gallops. No extremity edema. 2+ pedal pulses. No carotid bruits.   Abdomen: no tenderness, no masses palpated. No hepatosplenomegaly. Bowel sounds positive.  Musculoskeletal: no clubbing / cyanosis. No joint deformity upper and lower extremities. Good ROM, no contractures. Normal muscle tone.  Skin: no rashes, lesions, ulcers. No induration Neurologic: CN 2-12 grossly intact. Sensation intact, DTR normal. Strength 5/5 in all 4.  Psychiatric: Normal judgment and insight. Alert and oriented x 3. Normal mood.    Labs on Admission: I have personally reviewed following labs and imaging studies  CBC: Recent Labs  Lab 12/06/18 1045  WBC 4.2  NEUTROABS 2.8  HGB 12.2  HCT 35.9*  MCV 90.2  PLT 500   Basic Metabolic Panel: Recent Labs  Lab  12/06/18 1045  NA 131*  K 3.8  CL 95*  CO2 24  GLUCOSE 218*  BUN 20  CREATININE 1.02*  CALCIUM 8.2*   GFR: Estimated Creatinine Clearance: 41.1 mL/min (A) (by C-G formula based on SCr of 1.02 mg/dL (H)). Liver Function Tests: Recent Labs  Lab 12/06/18 1100  AST 37  ALT 25  ALKPHOS 23*  BILITOT 0.6  PROT 6.7  ALBUMIN 3.4*   No results for input(s): LIPASE, AMYLASE in the last 168 hours. No results for input(s): AMMONIA in the last 168 hours. Coagulation Profile: No results for input(s): INR, PROTIME in the last 168 hours. Cardiac Enzymes: No results for input(s): CKTOTAL, CKMB, CKMBINDEX, TROPONINI in the last 168 hours. BNP (last 3 results) No results for input(s): PROBNP in the last 8760 hours. HbA1C: No results for input(s): HGBA1C in the last 72 hours. CBG: No results for input(s): GLUCAP in the last 168 hours. Lipid Profile: No results for input(s): CHOL, HDL, LDLCALC, TRIG, CHOLHDL, LDLDIRECT in the last 72 hours. Thyroid Function Tests: No results for input(s): TSH, T4TOTAL, FREET4, T3FREE, THYROIDAB in the last 72 hours. Anemia Panel: Recent Labs    12/06/18 1100  FERRITIN 220   Urine analysis:    Component Value Date/Time   COLORURINE Yellow 01/28/2012 0923   APPEARANCEUR Hazy  01/28/2012 0923   LABSPEC 1.017 01/28/2012 0923   PHURINE 5.0 01/28/2012 0923   GLUCOSEU >=500 01/28/2012 0923   KETONESUR Negative 01/28/2012 0923   PROTEINUR Negative 01/28/2012 0923   NITRITE Negative 01/28/2012 0923   LEUKOCYTESUR Trace 01/28/2012 0923   Sepsis Labs: !!!!!!!!!!!!!!!!!!!!!!!!!!!!!!!!!!!!!!!!!!!! @LABRCNTIP (procalcitonin:4,lacticidven:4) )No results found for this or any previous visit (from the past 240 hour(s)).   Radiological Exams on Admission: Dg Chest Portable 1 View  Result Date: 12/06/2018 CLINICAL DATA:  Fever and shortness of breath. EXAM: PORTABLE CHEST 1 VIEW COMPARISON:  05/13/2018 FINDINGS: New interstitial and airspace densities in the right upper lung. There is also concern for acute on chronic densities in the left upper lung. Heart size is within normal limits and stable. Atherosclerotic calcifications at the aortic arch. Trachea is midline. Linear density at the right lung base is suggestive for atelectasis. IMPRESSION: New densities in both lungs.  Findings are concerning for pneumonia. Electronically Signed   By: Markus Daft M.D.   On: 12/06/2018 11:28    Old chart reviewed cxr reviewed bilatateral infiltrates  Assessment/Plan 83 yo female with covid pna and acute resp failure hypoxia  Principal Problem:   Acute respiratory disease due to COVID-19 virus- supportive care, wean o2 as tolerates  Active Problems:   Pneumonia due to COVID-19 virus- prone.  Vit c/zinc/asa/lovenox.  Decadron, remdisivir per pharmacy.  Wean o2 as tolerates    CKD (chronic kidney disease)- stable at baseline    DM (diabetes mellitus) ([place])- place on ssi    HTN (hypertension)- cont home meds once clarified  Med list pending pharm review  DVT prophylaxis: lovenox Code Status: full Family Communication: none Disposition Plan:  days Consults called: none Admission status:  admission   , A MD [facility]  If 7PM-7AM, please contact  night-coverage [email or web address] Password [facility]  12/06/2018, 7:50 PM

## 2018-12-07 ENCOUNTER — Encounter (HOSPITAL_COMMUNITY): Payer: Self-pay

## 2018-12-07 ENCOUNTER — Other Ambulatory Visit: Payer: Self-pay

## 2018-12-07 LAB — CBC WITH DIFFERENTIAL/PLATELET
Abs Immature Granulocytes: 0.01 10*3/uL (ref 0.00–0.07)
Basophils Absolute: 0 10*3/uL (ref 0.0–0.1)
Basophils Relative: 0 %
Eosinophils Absolute: 0 10*3/uL (ref 0.0–0.5)
Eosinophils Relative: 0 %
HCT: 35 % — ABNORMAL LOW (ref 36.0–46.0)
Hemoglobin: 11.4 g/dL — ABNORMAL LOW (ref 12.0–15.0)
Immature Granulocytes: 0 %
Lymphocytes Relative: 28 %
Lymphs Abs: 1.3 10*3/uL (ref 0.7–4.0)
MCH: 30.2 pg (ref 26.0–34.0)
MCHC: 32.6 g/dL (ref 30.0–36.0)
MCV: 92.8 fL (ref 80.0–100.0)
Monocytes Absolute: 0.4 10*3/uL (ref 0.1–1.0)
Monocytes Relative: 8 %
Neutro Abs: 2.8 10*3/uL (ref 1.7–7.7)
Neutrophils Relative %: 64 %
Platelets: 175 10*3/uL (ref 150–400)
RBC: 3.77 MIL/uL — ABNORMAL LOW (ref 3.87–5.11)
RDW: 13 % (ref 11.5–15.5)
WBC: 4.4 10*3/uL (ref 4.0–10.5)
nRBC: 0 % (ref 0.0–0.2)

## 2018-12-07 LAB — COMPREHENSIVE METABOLIC PANEL
ALT: 26 U/L (ref 0–44)
AST: 38 U/L (ref 15–41)
Albumin: 3.2 g/dL — ABNORMAL LOW (ref 3.5–5.0)
Alkaline Phosphatase: 26 U/L — ABNORMAL LOW (ref 38–126)
Anion gap: 11 (ref 5–15)
BUN: 22 mg/dL (ref 8–23)
CO2: 25 mmol/L (ref 22–32)
Calcium: 8.4 mg/dL — ABNORMAL LOW (ref 8.9–10.3)
Chloride: 98 mmol/L (ref 98–111)
Creatinine, Ser: 1.08 mg/dL — ABNORMAL HIGH (ref 0.44–1.00)
GFR calc Af Amer: 56 mL/min — ABNORMAL LOW (ref >60)
GFR calc non Af Amer: 48 mL/min — ABNORMAL LOW (ref >60)
Glucose, Bld: 343 mg/dL — ABNORMAL HIGH (ref 70–99)
Potassium: 4 mmol/L (ref 3.5–5.1)
Sodium: 134 mmol/L — ABNORMAL LOW (ref 135–145)
Total Bilirubin: 0.1 mg/dL — ABNORMAL LOW (ref 0.3–1.2)
Total Protein: 6.6 g/dL (ref 6.5–8.1)

## 2018-12-07 LAB — ABO/RH: ABO/RH(D): A POS

## 2018-12-07 LAB — GLUCOSE, CAPILLARY
Glucose-Capillary: 397 mg/dL — ABNORMAL HIGH (ref 70–99)
Glucose-Capillary: 146 mg/dL — ABNORMAL HIGH (ref 70–99)
Glucose-Capillary: 353 mg/dL — ABNORMAL HIGH (ref 70–99)
Glucose-Capillary: 531 mg/dL (ref 70–99)
Glucose-Capillary: 558 mg/dL (ref 70–99)
Glucose-Capillary: 482 mg/dL — ABNORMAL HIGH (ref 70–99)
Glucose-Capillary: 327 mg/dL — ABNORMAL HIGH (ref 70–99)
Glucose-Capillary: 221 mg/dL — ABNORMAL HIGH (ref 70–99)

## 2018-12-07 LAB — HEMOGLOBIN A1C
Hgb A1c MFr Bld: 9 % — ABNORMAL HIGH (ref 4.8–5.6)
Mean Plasma Glucose: 211.6 mg/dL

## 2018-12-07 LAB — C-REACTIVE PROTEIN: CRP: 12.6 mg/dL — ABNORMAL HIGH (ref <1.0)

## 2018-12-07 LAB — D-DIMER, QUANTITATIVE: D-Dimer, Quant: 0.57 ug/mL-FEU — ABNORMAL HIGH (ref 0.00–0.50)

## 2018-12-07 MED ORDER — INSULIN ASPART 100 UNIT/ML ~~LOC~~ SOLN
0.0000 [IU] | Freq: Three times a day (TID) | SUBCUTANEOUS | Status: DC
  Administered 2018-12-07: 12:00:00 15 [IU] via SUBCUTANEOUS

## 2018-12-07 MED ORDER — PANTOPRAZOLE SODIUM 40 MG PO TBEC
40.0000 mg | DELAYED_RELEASE_TABLET | Freq: Every day | ORAL | Status: DC
  Administered 2018-12-07 – 2018-12-11 (×5): 40 mg via ORAL
  Filled 2018-12-07 (×5): qty 1

## 2018-12-07 MED ORDER — INSULIN GLARGINE 100 UNIT/ML ~~LOC~~ SOLN
35.0000 [IU] | Freq: Every day | SUBCUTANEOUS | Status: DC
  Filled 2018-12-07: qty 0.35

## 2018-12-07 MED ORDER — METHYLPREDNISOLONE SODIUM SUCC 125 MG IJ SOLR
60.0000 mg | Freq: Two times a day (BID) | INTRAMUSCULAR | Status: DC
  Administered 2018-12-07 (×2): 60 mg via INTRAVENOUS
  Filled 2018-12-07 (×2): qty 2

## 2018-12-07 MED ORDER — FENOFIBRATE 54 MG PO TABS
54.0000 mg | ORAL_TABLET | Freq: Every day | ORAL | Status: DC
  Administered 2018-12-07 – 2018-12-11 (×5): 54 mg via ORAL
  Filled 2018-12-07 (×6): qty 1

## 2018-12-07 MED ORDER — INSULIN REGULAR(HUMAN) IN NACL 100-0.9 UT/100ML-% IV SOLN
INTRAVENOUS | Status: DC
  Administered 2018-12-07: 18:00:00 4.7 [IU]/h via INTRAVENOUS
  Filled 2018-12-07 (×2): qty 100

## 2018-12-07 MED ORDER — INSULIN REGULAR BOLUS VIA INFUSION
0.0000 [IU] | Freq: Three times a day (TID) | INTRAVENOUS | Status: DC
  Filled 2018-12-07: qty 10

## 2018-12-07 MED ORDER — INSULIN GLARGINE 100 UNIT/ML ~~LOC~~ SOLN
20.0000 [IU] | Freq: Every day | SUBCUTANEOUS | Status: DC
  Administered 2018-12-07: 11:00:00 20 [IU] via SUBCUTANEOUS
  Filled 2018-12-07: qty 0.2

## 2018-12-07 MED ORDER — INSULIN ASPART 100 UNIT/ML ~~LOC~~ SOLN: 0.0000 [IU] | Freq: Every day | SUBCUTANEOUS | Status: DC

## 2018-12-07 MED ORDER — CITALOPRAM HYDROBROMIDE 10 MG PO TABS
20.0000 mg | ORAL_TABLET | Freq: Every day | ORAL | Status: DC
  Administered 2018-12-08 – 2018-12-11 (×4): 20 mg via ORAL
  Filled 2018-12-07 (×4): qty 2

## 2018-12-07 MED ORDER — CITALOPRAM HYDROBROMIDE 10 MG PO TABS
40.0000 mg | ORAL_TABLET | Freq: Every day | ORAL | Status: DC
  Administered 2018-12-07: 11:00:00 40 mg via ORAL
  Filled 2018-12-07: qty 4

## 2018-12-07 MED ORDER — VITAMIN B-12 500 MCG PO TABS
500.0000 ug | ORAL_TABLET | Freq: Every day | ORAL | Status: DC
  Administered 2018-12-07 – 2018-12-11 (×5): 500 ug via ORAL
  Filled 2018-12-07 (×7): qty 1

## 2018-12-07 MED ORDER — PREDNISOLONE ACETATE 1 % OP SUSP
2.0000 [drp] | Freq: Three times a day (TID) | OPHTHALMIC | Status: DC
  Administered 2018-12-07 – 2018-12-11 (×6): 2 [drp] via OPHTHALMIC
  Filled 2018-12-07: qty 1

## 2018-12-07 MED ORDER — ALBUTEROL SULFATE HFA 108 (90 BASE) MCG/ACT IN AERS
2.0000 | INHALATION_SPRAY | Freq: Four times a day (QID) | RESPIRATORY_TRACT | Status: DC | PRN
  Filled 2018-12-07: qty 6.7

## 2018-12-07 MED ORDER — ENOXAPARIN SODIUM 40 MG/0.4ML ~~LOC~~ SOLN
40.0000 mg | Freq: Every day | SUBCUTANEOUS | Status: DC
  Administered 2018-12-07 – 2018-12-10 (×4): 40 mg via SUBCUTANEOUS
  Filled 2018-12-07 (×4): qty 0.4

## 2018-12-07 MED ORDER — GLIMEPIRIDE 2 MG PO TABS
2.0000 mg | ORAL_TABLET | Freq: Two times a day (BID) | ORAL | Status: DC
  Administered 2018-12-07 – 2018-12-09 (×4): 2 mg via ORAL
  Filled 2018-12-07 (×6): qty 1

## 2018-12-07 MED ORDER — BISOPROLOL-HYDROCHLOROTHIAZIDE 5-6.25 MG PO TABS
0.5000 | ORAL_TABLET | Freq: Every day | ORAL | Status: DC
  Administered 2018-12-07 – 2018-12-08 (×2): 0.5 via ORAL
  Filled 2018-12-07 (×3): qty 0.5

## 2018-12-07 MED ORDER — DEXTROSE 50 % IV SOLN: 25.0000 mL | INTRAVENOUS | Status: DC | PRN

## 2018-12-07 MED ORDER — AMLODIPINE BESYLATE 5 MG PO TABS
10.0000 mg | ORAL_TABLET | Freq: Every day | ORAL | Status: DC
  Administered 2018-12-07 – 2018-12-11 (×5): 10 mg via ORAL
  Filled 2018-12-07 (×5): qty 2

## 2018-12-07 MED ORDER — SODIUM CHLORIDE 0.9 % IV SOLN
INTRAVENOUS | Status: DC
  Administered 2018-12-07: 18:00:00 via INTRAVENOUS

## 2018-12-07 MED ORDER — HYDRALAZINE HCL 50 MG PO TABS
50.0000 mg | ORAL_TABLET | Freq: Three times a day (TID) | ORAL | Status: DC
  Administered 2018-12-07 – 2018-12-11 (×14): 50 mg via ORAL
  Filled 2018-12-07 (×14): qty 1

## 2018-12-07 MED ORDER — HYDRALAZINE HCL 20 MG/ML IJ SOLN: 10.0000 mg | Freq: Four times a day (QID) | INTRAMUSCULAR | Status: DC | PRN

## 2018-12-07 NOTE — Plan of Care (Signed)
Care plan discussed with pt and initiated

## 2018-12-07 NOTE — Progress Notes (Signed)
PROGRESS NOTE                                                                                                                                                                                                             Patient Demographics:    Samantha Freeman, is a 83 y.o. female, DOB - [birth year], EPP:[id number]  Outpatient Primary MD for the patient is Idelle Crouch, MD    LOS - 1  Admit date - 12/06/2018    CC - SOB     Brief Narrative  Samantha Freeman is a 83 y.o. female with medical history significant of ckd, htn, dm dx with covid 8/10 presents at [facility] ED with worsening sob.  No n/v/ but some diarrhea, no abd pain.  Has had loss of taste and smell, her son who is works for an ambulance service had recently contracted COVID-90.  She developed some shortness of breath a day prior to admission and was then brought to the hospital where she was diagnosed with COVID-19 pneumonia and admitted.   Subjective:    Samantha Freeman today has, No headache, No chest pain, No abdominal pain - No Nausea, No new weakness tingling or numbness, stable Cough & SOB.     Assessment  & Plan :    Principal Problem:   Acute respiratory disease due to COVID-19 virus Active Problems:   Pneumonia due to COVID-19 virus   CKD (chronic kidney disease)   DM (diabetes mellitus) (H[facility])   HTN (hypertension)   PNA (pneumonia)   1. Acute Hypoxic Resp. Failure due to Acute Covid 19 Viral Pneumonitis during the ongoing 2020 Covid 19 Pandemic - she is been appropriately started on combination of IV steroids and Remdisvir.  Continue oxygen supplementation she is currently on 4 L nasal cannula oxygen and feeling stable.  Continue monitoring inflammatory markers, requested to sit up in chair use I-S and flutter valve for pulmonary toiletery in the daytime and prone at night when in bed.  Case discussed with patient and her son, both agree for Actemra  use if needed.  Understand the risks and benefits and want to proceed with it.  No history of TB or hepatitis.   COVID-19 Labs  Recent Labs    12/06/18 1100 12/06/18 1101 12/07/18 0158  DDIMER  --   --  0.57*  FERRITIN  220  --   --   CRP  --  11.1* 12.6*    No results found for: SARSCOV2NAA   Hepatic Function Latest Ref Rng & Units 12/07/2018 12/06/2018  Total Protein 6.5 - 8.1 g/dL 6.6 6.7  Albumin 3.5 - 5.0 g/dL 3.2(L) 3.4(L)  AST 15 - 41 U/L 38 37  ALT 0 - 44 U/L 26 25  Alk Phosphatase 38 - 126 U/L 26(L) 23(L)  Total Bilirubin 0.3 - 1.2 mg/dL 0.1(L) 0.6  Bilirubin, Direct 0.0 - 0.2 mg/dL - 0.1        Component Value Date/Time   BNP 266.0 (H) 12/06/2018 1040      2.  CKD 2.  Currently at baseline.  3.  Essential hypertension.  In poor control.  Have increased home dose Norvasc, added hydralazine scheduled orally and PRN IV.  4.  Anxiety and depression.  Continue home medications.  5.  Dyslipidemia.  On statin and fenofibrate combination.  6.  GERD.  PPI.  7.  DM type II.  Currently on moderate dose sliding scale will add Lantus due to ongoing steroid use and monitor closely.  She has poor outpatient control due to hyperglycemia A1c was 9.  We will also provide her with diabetic and insulin education prior to discharge.  Lab Results  Component Value Date   HGBA1C 9.0 (H) 12/07/2018    CBG (last 3)  Recent Labs    12/07/18 0744  GLUCAP 146*      Condition - Extremely Guarded  Family Communication  : Son 12/07/2018 updated in detail, he knows his mom wants to be DNR.  Code Status : DNR per patient's wishes.  Son Laverna Peace aware of her wishes.  Diet :   Diet Order            Diet Carb Modified Fluid consistency: Thin; Room service appropriate? Yes  Diet effective now               Disposition Plan  : Home in 4 to 5 days once better  Consults  :  None  Procedures  :     PUD Prophylaxis :  PPI  DVT Prophylaxis  :  Lovenox started  Lab Results   Component Value Date   PLT 175 12/07/2018    Inpatient Medications  Scheduled Meds: . amLODipine  10 mg Oral Daily  . aspirin EC  81 mg Oral QODAY  . bisoprolol-hydrochlorothiazide  0.5 tablet Oral Daily  . citalopram  40 mg Oral Daily  . fenofibrate  54 mg Oral Daily  . hydrALAZINE  50 mg Oral Q8H  . insulin aspart  0-15 Units Subcutaneous TID WC  . insulin aspart  0-5 Units Subcutaneous QHS  . methylPREDNISolone (SOLU-MEDROL) injection  60 mg Intravenous Q12H  . pantoprazole  40 mg Oral Daily  . rosuvastatin  5 mg Oral Daily  . vitamin B-12  500 mcg Oral Daily  . vitamin C  500 mg Oral Daily  . zinc sulfate  220 mg Oral Daily   Continuous Infusions: . remdesivir 100 mg in NS 250 mL     PRN Meds:.acetaminophen, albuterol, ALPRAZolam, hydrALAZINE, Melatonin, ondansetron **OR** ondansetron (ZOFRAN) IV  Antibiotics  :    Anti-infectives (From admission, onward)   Start     Dose/Rate Route Frequency Ordered Stop   12/07/18 2200  remdesivir 100 mg in sodium chloride 0.9 % 250 mL IVPB     100 mg 500 mL/hr over 30 Minutes Intravenous Every 24 hours  12/06/18 2009 12/11/18 2159   12/06/18 2200  remdesivir 200 mg in sodium chloride 0.9 % 250 mL IVPB     200 mg 500 mL/hr over 30 Minutes Intravenous Once 12/06/18 2009 12/06/18 2336       Time Spent in minutes  30   Lala Lund M.D on 12/07/2018 at 9:06 AM  To page go to [email or web address] - password [name]  [facility] -  Office  [phone]  See all Orders from today for further details    Objective:   Vitals:   12/06/18 2300 12/07/18 0000 12/07/18 0745 12/07/18 0758  BP: (!) 169/68 (!) 159/68 (!) 181/69 (!) 180/70  Pulse: 61 60 68 68  Resp: (!) 27 (!) 24 (!) 25 (!) 28  Temp:  98.8 F (37.1 C) 100.1 F (37.8 C)   TempSrc:  Oral Oral   SpO2: 95% 93% 92% 92%  Weight:      Height:        Wt Readings from Last 3 Encounters:  12/06/18 68.5 kg  12/06/18 68.5 kg  05/13/18 70.4 kg     Intake/Output  Summary (Last 24 hours) at 12/07/2018 0906 Last data filed at 12/07/2018 0320 Gross per 24 hour  Intake 695 ml  Output 300 ml  Net 395 ml     Physical Exam  Awake Alert, Oriented X 3, No new F.N deficits, Normal affect [place].AT,PERRAL Supple Neck,No JVD, No cervical lymphadenopathy appriciated.  Symmetrical Chest wall movement, Good air movement bilaterally, CTAB RRR,No Gallops,Rubs or new Murmurs, No Parasternal Heave +ve B.Sounds, Abd Soft, No tenderness, No organomegaly appriciated, No rebound - guarding or rigidity. No Cyanosis, Clubbing or edema, No new Rash or bruise     Data Review:    CBC Recent Labs  Lab 12/06/18 1045 12/07/18 0158  WBC 4.2 4.4  HGB 12.2 11.4*  HCT 35.9* 35.0*  PLT 166 175  MCV 90.2 92.8  MCH 30.7 30.2  MCHC 34.0 32.6  RDW 12.9 13.0  LYMPHSABS 1.1 1.3  MONOABS 0.3 0.4  EOSABS 0.0 0.0  BASOSABS 0.0 0.0    Chemistries  Recent Labs  Lab 12/06/18 1045 12/06/18 1100 12/07/18 0158  NA 131*  --  134*  K 3.8  --  4.0  CL 95*  --  98  CO2 24  --  25  GLUCOSE 218*  --  343*  BUN 20  --  22  CREATININE 1.02*  --  1.08*  CALCIUM 8.2*  --  8.4*  AST  --  37 38  ALT  --  25 26  ALKPHOS  --  23* 26*  BILITOT  --  0.6 0.1*   ------------------------------------------------------------------------------------------------------------------ No results for input(s): CHOL, HDL, LDLCALC, TRIG, CHOLHDL, LDLDIRECT in the last 72 hours.  Lab Results  Component Value Date   HGBA1C 9.0 (H) 12/07/2018   ------------------------------------------------------------------------------------------------------------------ No results for input(s): TSH, T4TOTAL, T3FREE, THYROIDAB in the last 72 hours.  Invalid input(s): FREET3  Cardiac Enzymes No results for input(s): CKMB, TROPONINI, MYOGLOBIN in the last 168 hours.  Invalid input(s): CK ------------------------------------------------------------------------------------------------------------------     Component Value Date/Time   BNP 266.0 (H) 12/06/2018 1040    Micro Results No results found for this or any previous visit (from the past 240 hour(s)).  Radiology Reports Dg Chest Portable 1 View  Result Date: 12/06/2018 CLINICAL DATA:  Fever and shortness of breath. EXAM: PORTABLE CHEST 1 VIEW COMPARISON:  05/13/2018 FINDINGS: New interstitial and airspace densities in the right upper lung. There is also  concern for acute on chronic densities in the left upper lung. Heart size is within normal limits and stable. Atherosclerotic calcifications at the aortic arch. Trachea is midline. Linear density at the right lung base is suggestive for atelectasis. IMPRESSION: New densities in both lungs.  Findings are concerning for pneumonia. Electronically Signed   By: Markus Daft M.D.   On: 12/06/2018 11:28

## 2018-12-07 NOTE — Progress Notes (Signed)
Phoned the Pt's son, updated him on the status of the Pt and that we started a Hep gtt on the Pt,  Advised that we were able to get her b/p meds and she did get them and her b/p did come down this day.

## 2018-12-07 NOTE — Progress Notes (Signed)
Pt ambulating from [facility] to the chair

## 2018-12-07 NOTE — Progress Notes (Signed)
Just ambulated from bed to [facility] to chair

## 2018-12-07 NOTE — Plan of Care (Signed)
  Problem: Education: Goal: Knowledge of risk factors and measures for prevention of condition will improve Outcome: Progressing   Problem: Coping: Goal: Psychosocial and spiritual needs will be supported Outcome: Progressing   Problem: Respiratory: Goal: Will maintain a patent airway Outcome: Progressing Goal: Complications related to the disease process, condition or treatment will be avoided or minimized Outcome: Progressing   

## 2018-12-07 NOTE — Progress Notes (Signed)
ANTICOAGULATION CONSULT NOTE - Initial Consult  Pharmacy Consult for Lovenox Indication: VTE prophylaxis  Allergies  Allergen Reactions  . Propofol Anaphylaxis  . Zolpidem Other (See Comments)    Hallucinations  . Codeine Nausea Only  . Niacin Rash    Patient Measurements: Height: 5\' 4"  (162.6 cm) Weight: 151 lb 0.2 oz (68.5 kg) IBW/kg (Calculated) : 54.7   Labs: Recent Labs    12/06/18 1045 12/06/18 1249 12/07/18 0158  HGB 12.2  --  11.4*  HCT 35.9*  --  35.0*  PLT 166  --  175  CREATININE 1.02*  --  1.08*  TROPONINIHS 27* 24*  --     Estimated Creatinine Clearance: 38.8 mL/min (A) (by C-G formula based on SCr of 1.08 mg/dL (H)).  Medical History: Past Medical History:  Diagnosis Date  . Anxiety   . Chronic kidney disease   . Complication of anesthesia    Propofol anaphylaxis  . Depression   . Diabetes mellitus without complication ([place])   . GERD (gastroesophageal reflux disease)   . Hypertension     Medications:  Medications Prior to Admission  Medication Sig Dispense Refill Last Dose  . ALPRAZolam (XANAX) 0.5 MG tablet Take 0.5 mg by mouth at bedtime as needed for sleep.    Past Week at Unknown time  . amLODipine (NORVASC) 10 MG tablet Take 5 mg by mouth daily.   Past Week at Unknown time  . aspirin EC 81 MG tablet Take 81 mg by mouth every other day.   Past Week at Unknown time  . bisoprolol-hydrochlorothiazide (ZIAC) 5-6.25 MG tablet Take 0.5 tablets by mouth daily.   Past Week at Unknown time  . Calcium Carb-Cholecalciferol (CALCIUM 600/VITAMIN D3 PO) Take 1 tablet by mouth daily.   Past Week at Unknown time  . cetirizine (ZYRTEC) 10 MG chewable tablet Chew 10 mg by mouth daily.   Past Week at Unknown time  . cholecalciferol (VITAMIN D3) 25 MCG (1000 UT) tablet Take 1,000 Units by mouth daily.   Past Week at Unknown time  . citalopram (CELEXA) 40 MG tablet Take 40 mg by mouth daily.   Past Week at Unknown time  . Desoximetasone 0.05 % OINT Apply 1  application topically daily as needed (for ear itching).   Past Week at Unknown time  . enalapril (VASOTEC) 20 MG tablet Take 20 mg by mouth 2 (two) times daily.   Past Week at Unknown time  . fenofibrate (TRICOR) 145 MG tablet Take 145 mg by mouth daily.   Past Week at Unknown time  . ferrous sulfate 325 (65 FE) MG tablet Take 325 mg by mouth daily with breakfast.   Past Week at Unknown time  . glimepiride (AMARYL) 2 MG tablet Take 2 mg by mouth 2 (two) times daily.   Past Week at Unknown time  . hydrochlorothiazide (HYDRODIURIL) 12.5 MG tablet Take 12.5 mg by mouth daily.   Past Week at Unknown time  . magnesium oxide (MAG-OX) 400 MG tablet Take 400 mg by mouth 3 (three) times daily.   Past Week at Unknown time  . Melatonin 5 MG CAPS Take 2.5-5 mg by mouth at bedtime as needed (for sleep).   Past Week at Unknown time  . metFORMIN (GLUCOPHAGE) 1000 MG tablet Take 1,000 mg by mouth 2 (two) times daily.   Past Week at Unknown time  . Multiple Vitamin (MULTIVITAMIN) tablet Take 1 tablet by mouth daily. Women's Daily Multivitamin   Past Week at Unknown time  .  omeprazole (PRILOSEC) 20 MG capsule Take 20 mg by mouth daily.   Past Week at Unknown time  . oxyCODONE (ROXICODONE) 5 MG immediate release tablet Take 1-2 tablets (5-10 mg total) by mouth every 4 (four) hours as needed for moderate pain or severe pain. 50 tablet 0 Past Week at Unknown time  . prednisoLONE acetate (PRED FORTE) 1 % ophthalmic suspension Apply 1 drop to eye at bedtime.   Past Week at Unknown time  . rosuvastatin (CRESTOR) 5 MG tablet Take 5 mg by mouth daily.   Past Week at Unknown time  . Semaglutide 14 MG TABS Take 1 tablet by mouth. This is part of a clinical trial. The patient is either taking Semaglutide 14mg  tab daily OR a Placebo.   Past Week at Unknown time  . vitamin B-12 (CYANOCOBALAMIN) 500 MCG tablet Take 500 mcg by mouth daily.   Past Week at Unknown time  . sitaGLIPtin (JANUVIA) 50 MG tablet Take 50 mg by mouth daily.       Scheduled:  . amLODipine  10 mg Oral Daily  . aspirin EC  81 mg Oral QODAY  . bisoprolol-hydrochlorothiazide  0.5 tablet Oral Daily  . citalopram  40 mg Oral Daily  . fenofibrate  54 mg Oral Daily  . hydrALAZINE  50 mg Oral Q8H  . insulin aspart  0-15 Units Subcutaneous TID WC  . insulin aspart  0-5 Units Subcutaneous QHS  . insulin glargine  20 Units Subcutaneous Daily  . methylPREDNISolone (SOLU-MEDROL) injection  60 mg Intravenous Q12H  . pantoprazole  40 mg Oral Daily  . rosuvastatin  5 mg Oral Daily  . vitamin B-12  500 mcg Oral Daily  . vitamin C  500 mg Oral Daily  . zinc sulfate  220 mg Oral Daily    Assessment: 26 yoF admitted on 8/15 with COVID-19 pneumonia.  Pharmacy is consulted for Lovenox dosing for VTE prophylaxis. - SCr 1.08, CrCl ~ 38 ml/min - BMI 25, D-dimer 0.57 - CBC: Hgb 11.4, Plt 175  Goal of Therapy:  Monitor platelets by anticoagulation protocol: Yes   Plan:  Continue Lovenox 40 mg SQ q24h. Pharmacy will sign off, please reconsult if needed.   Gretta Arab PharmD, BCPS Clinical pharmacist phone 7am- 5pm: [phone] 12/07/2018 9:21 AM

## 2018-12-07 NOTE — Progress Notes (Addendum)
Inpatient Diabetes Program Recommendations  AACE/ADA: New Consensus Statement on Inpatient Glycemic Control (2015)  Target Ranges:  Prepandial:   less than 140 mg/dL      Peak postprandial:   less than 180 mg/dL (1-2 hours)      Critically ill patients:  140 - 180 mg/dL   Lab Results  Component Value Date   GLUCAP 146 (H) 12/07/2018   HGBA1C 9.0 (H) 12/07/2018    Review of Glycemic Control  Diabetes history: DM 2 Outpatient Diabetes medications: Glimepiride 2 mg bid, Metformin 1000 mg bid, Semaglutide 14 mg tablet Daily or placebo (research study medication), Januvia 50 mg Daily  Current orders for Inpatient glycemic control:  Lantus 20 units Daily Novolog 0-15 units tid Novolog 0-5 units qhs  Solumedrol 60 mg Q12 hours  Consult for insulin and DM teaching  A1c 9% on 8/16.   Patient on oral meds at home. Patient just admitted. Will follow patient through hospital stay and will educate when more appropriate, closer to discharge so patient will have recent information to remember.  Thanks,  Tama Headings RN, MSN, BC-ADM Inpatient Diabetes Coordinator Team Pager [phone] (8a-5p)

## 2018-12-08 ENCOUNTER — Other Ambulatory Visit: Payer: Self-pay

## 2018-12-08 LAB — CBC WITH DIFFERENTIAL/PLATELET
Abs Immature Granulocytes: 0.02 10*3/uL (ref 0.00–0.07)
Basophils Absolute: 0 10*3/uL (ref 0.0–0.1)
Basophils Relative: 0 %
Eosinophils Absolute: 0 10*3/uL (ref 0.0–0.5)
Eosinophils Relative: 0 %
HCT: 37.2 % (ref 36.0–46.0)
Hemoglobin: 12.3 g/dL (ref 12.0–15.0)
Immature Granulocytes: 1 %
Lymphocytes Relative: 19 %
Lymphs Abs: 0.8 10*3/uL (ref 0.7–4.0)
MCH: 30.4 pg (ref 26.0–34.0)
MCHC: 33.1 g/dL (ref 30.0–36.0)
MCV: 92.1 fL (ref 80.0–100.0)
Monocytes Absolute: 0.2 10*3/uL (ref 0.1–1.0)
Monocytes Relative: 5 %
Neutro Abs: 3.2 10*3/uL (ref 1.7–7.7)
Neutrophils Relative %: 75 %
Platelets: 251 10*3/uL (ref 150–400)
RBC: 4.04 MIL/uL (ref 3.87–5.11)
RDW: 13.1 % (ref 11.5–15.5)
WBC: 4.2 10*3/uL (ref 4.0–10.5)
nRBC: 0 % (ref 0.0–0.2)

## 2018-12-08 LAB — COMPREHENSIVE METABOLIC PANEL
ALT: 32 U/L (ref 0–44)
AST: 47 U/L — ABNORMAL HIGH (ref 15–41)
Albumin: 3.1 g/dL — ABNORMAL LOW (ref 3.5–5.0)
Alkaline Phosphatase: 27 U/L — ABNORMAL LOW (ref 38–126)
Anion gap: 12 (ref 5–15)
BUN: 31 mg/dL — ABNORMAL HIGH (ref 8–23)
CO2: 27 mmol/L (ref 22–32)
Calcium: 8.9 mg/dL (ref 8.9–10.3)
Chloride: 99 mmol/L (ref 98–111)
Creatinine, Ser: 1.01 mg/dL — ABNORMAL HIGH (ref 0.44–1.00)
GFR calc Af Amer: >60 mL/min (ref >60)
GFR calc non Af Amer: 52 mL/min — ABNORMAL LOW (ref >60)
Glucose, Bld: 146 mg/dL — ABNORMAL HIGH (ref 70–99)
Potassium: 4.3 mmol/L (ref 3.5–5.1)
Sodium: 138 mmol/L (ref 135–145)
Total Bilirubin: 0.5 mg/dL (ref 0.3–1.2)
Total Protein: 6.9 g/dL (ref 6.5–8.1)

## 2018-12-08 LAB — GLUCOSE, CAPILLARY
Glucose-Capillary: 110 mg/dL — ABNORMAL HIGH (ref 70–99)
Glucose-Capillary: 140 mg/dL — ABNORMAL HIGH (ref 70–99)
Glucose-Capillary: 150 mg/dL — ABNORMAL HIGH (ref 70–99)
Glucose-Capillary: 152 mg/dL — ABNORMAL HIGH (ref 70–99)
Glucose-Capillary: 155 mg/dL — ABNORMAL HIGH (ref 70–99)
Glucose-Capillary: 171 mg/dL — ABNORMAL HIGH (ref 70–99)
Glucose-Capillary: 371 mg/dL — ABNORMAL HIGH (ref 70–99)
Glucose-Capillary: 449 mg/dL — ABNORMAL HIGH (ref 70–99)
Glucose-Capillary: 483 mg/dL — ABNORMAL HIGH (ref 70–99)
Glucose-Capillary: 520 mg/dL (ref 70–99)
Glucose-Capillary: 548 mg/dL (ref 70–99)

## 2018-12-08 LAB — C-REACTIVE PROTEIN: CRP: 9 mg/dL — ABNORMAL HIGH (ref <1.0)

## 2018-12-08 LAB — FERRITIN: Ferritin: 264 ng/mL (ref 11–307)

## 2018-12-08 LAB — LACTATE DEHYDROGENASE: LDH: 232 U/L — ABNORMAL HIGH (ref 98–192)

## 2018-12-08 LAB — BRAIN NATRIURETIC PEPTIDE: B Natriuretic Peptide: 111 pg/mL — ABNORMAL HIGH (ref 0.0–100.0)

## 2018-12-08 LAB — MAGNESIUM: Magnesium: 1.8 mg/dL (ref 1.7–2.4)

## 2018-12-08 LAB — D-DIMER, QUANTITATIVE: D-Dimer, Quant: 1.75 ug/mL-FEU — ABNORMAL HIGH (ref 0.00–0.50)

## 2018-12-08 MED ORDER — METHYLPREDNISOLONE SODIUM SUCC 40 MG IJ SOLR
40.0000 mg | Freq: Two times a day (BID) | INTRAMUSCULAR | Status: DC
  Administered 2018-12-08: 11:00:00 40 mg via INTRAVENOUS
  Filled 2018-12-08: qty 1

## 2018-12-08 MED ORDER — INSULIN ASPART 100 UNIT/ML ~~LOC~~ SOLN
3.0000 [IU] | Freq: Three times a day (TID) | SUBCUTANEOUS | Status: DC
  Administered 2018-12-08: 08:00:00 3 [IU] via SUBCUTANEOUS

## 2018-12-08 MED ORDER — INSULIN ASPART 100 UNIT/ML ~~LOC~~ SOLN: 0.0000 [IU] | Freq: Three times a day (TID) | SUBCUTANEOUS | Status: DC

## 2018-12-08 MED ORDER — METOPROLOL TARTRATE 25 MG PO TABS
25.0000 mg | ORAL_TABLET | Freq: Two times a day (BID) | ORAL | Status: DC
  Administered 2018-12-08 – 2018-12-09 (×4): 25 mg via ORAL
  Filled 2018-12-08 (×4): qty 1

## 2018-12-08 MED ORDER — METHYLPREDNISOLONE SODIUM SUCC 40 MG IJ SOLR: 30.0000 mg | Freq: Two times a day (BID) | INTRAMUSCULAR | Status: DC

## 2018-12-08 MED ORDER — INSULIN GLARGINE 100 UNIT/ML ~~LOC~~ SOLN
45.0000 [IU] | Freq: Every day | SUBCUTANEOUS | Status: DC
  Filled 2018-12-08: qty 0.45

## 2018-12-08 MED ORDER — INSULIN ASPART 100 UNIT/ML ~~LOC~~ SOLN
25.0000 [IU] | Freq: Once | SUBCUTANEOUS | Status: AC
  Administered 2018-12-08: 12:00:00 25 [IU] via SUBCUTANEOUS

## 2018-12-08 MED ORDER — METHYLPREDNISOLONE SODIUM SUCC 40 MG IJ SOLR
40.0000 mg | Freq: Every day | INTRAMUSCULAR | Status: DC
  Administered 2018-12-09: 10:00:00 40 mg via INTRAVENOUS
  Filled 2018-12-08: qty 1

## 2018-12-08 MED ORDER — INSULIN ASPART 100 UNIT/ML ~~LOC~~ SOLN
25.0000 [IU] | Freq: Once | SUBCUTANEOUS | Status: AC
  Administered 2018-12-08: 16:00:00 25 [IU] via SUBCUTANEOUS

## 2018-12-08 MED ORDER — INSULIN ASPART 100 UNIT/ML ~~LOC~~ SOLN
0.0000 [IU] | Freq: Every day | SUBCUTANEOUS | Status: DC
  Administered 2018-12-08: 21:00:00 5 [IU] via SUBCUTANEOUS

## 2018-12-08 MED ORDER — INSULIN ASPART 100 UNIT/ML ~~LOC~~ SOLN
5.0000 [IU] | Freq: Three times a day (TID) | SUBCUTANEOUS | Status: DC
  Administered 2018-12-08 (×2): 5 [IU] via SUBCUTANEOUS

## 2018-12-08 MED ORDER — INSULIN GLARGINE 100 UNIT/ML ~~LOC~~ SOLN
20.0000 [IU] | Freq: Once | SUBCUTANEOUS | Status: AC
  Administered 2018-12-08: 12:00:00 20 [IU] via SUBCUTANEOUS
  Filled 2018-12-08: qty 0.2

## 2018-12-08 MED ORDER — INSULIN GLARGINE 100 UNIT/ML ~~LOC~~ SOLN
25.0000 [IU] | Freq: Every day | SUBCUTANEOUS | Status: DC
  Administered 2018-12-08: 08:00:00 25 [IU] via SUBCUTANEOUS
  Filled 2018-12-08: qty 0.25

## 2018-12-08 MED ORDER — INSULIN ASPART 100 UNIT/ML ~~LOC~~ SOLN: 0.0000 [IU] | Freq: Every day | SUBCUTANEOUS | Status: DC

## 2018-12-08 NOTE — Progress Notes (Signed)
Phoned Pt's son, updated him on Pt's CBG's, and change din medications.  He inquired on Pt's labs and how she was doing overall .  He was satisfied with care his mother was receiving here and thanked [place].

## 2018-12-08 NOTE — Plan of Care (Signed)
POC reviewed with patient 

## 2018-12-08 NOTE — Progress Notes (Signed)
Patient able to transfer from chair to [facility] to bed with SBA. VSS, Pt denies pain and nausea but does report a bit SOB when transferring. O2 sats drop to around 82% but rebound within 2-3 mins into the mid 90's

## 2018-12-08 NOTE — Evaluation (Signed)
Physical Therapy Evaluation Patient Details Name: Samantha Freeman MRN: [id number] DOB: 01/20/1936 Today's Date: 12/08/2018   History of Present Illness  83 y.o. female with medical history significant of rt TKR, rt RTC repeair, ckd, htn, dm dx with covid 8/10 presents at [facility] ED with worsening sob.  No n/v/ but some diarrhea. Bil pna  Clinical Impression  Pt admitted with above diagnosis. Patient is globally weak/shakey when up walking and desaturates to 86% on 2L with slow walking. She mostly lives alone (her son stays with her several nights per week when he is working in [employer]). Anticipate she will be able to return to this living situation. Pt currently with functional limitations due to the deficits listed below (see PT Problem List). Pt will benefit from skilled PT to increase their independence and safety with mobility to allow discharge to the venue listed below.       Follow Up Recommendations Home health PT;Supervision - Intermittent    Equipment Recommendations  None recommended by PT    Recommendations for Other Services OT consult     Precautions / Restrictions Precautions Precautions: Fall      Mobility  Bed Mobility Overal bed mobility: Modified Independent             General bed mobility comments: HOB 0; with rail  Transfers Overall transfer level: Needs assistance Equipment used: None Transfers: Sit to/from Stand Sit to Stand: Supervision         General transfer comment: for safety due to lines and decr sats  Ambulation/Gait Ambulation/Gait assistance: Min assist Gait Distance (Feet): 45 Feet Assistive device: 1 person hand held assist Gait Pattern/deviations: Step-through pattern;Decreased stride length;Wide base of support Gait velocity: slow to try to maintain sats>88%   General Gait Details: shakey, unsteady   Stairs            Wheelchair Mobility    Modified Rankin (Stroke Patients Only)       Balance Overall  balance assessment: Needs assistance Sitting-balance support: No upper extremity supported;Feet supported Sitting balance-Leahy Scale: Good     Standing balance support: No upper extremity supported Standing balance-Leahy Scale: Fair                               Pertinent Vitals/Pain Pain Assessment: No/denies pain    Home Living Family/patient expects to be discharged to:: Private residence Living Arrangements: Alone Available Help at Discharge: Family;Available PRN/intermittently Type of Home: House Home Access: Level entry     Home Layout: One level Home Equipment: Walker - 2 wheels;Cane - single point      Prior Function Level of Independence: Independent         Comments: drives, does grocery shopping (more difficult recently due to SOB)     Hand Dominance   Dominant Hand: Right    Extremity/Trunk Assessment   Upper Extremity Assessment Upper Extremity Assessment: Defer to OT evaluation    Lower Extremity Assessment Lower Extremity Assessment: Generalized weakness    Cervical / Trunk Assessment Cervical / Trunk Assessment: Normal  Communication   Communication: No difficulties  Cognition Arousal/Alertness: Awake/alert Behavior During Therapy: WFL for tasks assessed/performed Overall Cognitive Status: Within Functional Limits for tasks assessed                                        General Comments  General comments (skin integrity, edema, etc.): On 2L O2 with sats 92% at rest, decr to 86% with slow walking (standing rest x 2 in 45 ft with max recovery to 87%). Once seated incr to 88% in 1 minute and 92% after several minutes    Exercises Other Exercises Other Exercises: educated in use of IS; pt able to pull 750 ml   Assessment/Plan    PT Assessment Patient needs continued PT services  PT Problem List Decreased strength;Decreased activity tolerance;Decreased balance;Decreased mobility;Decreased knowledge of use of  DME;Cardiopulmonary status limiting activity       PT Treatment Interventions DME instruction;Gait training;Functional mobility training;Therapeutic activities;Therapeutic exercise;Balance training;Patient/family education    PT Goals (Current goals can be found in the Care Plan section)  Acute Rehab PT Goals Patient Stated Goal: return home without needing assistive device to walk PT Goal Formulation: With patient Time For Goal Achievement: 12/22/18 Potential to Achieve Goals: Good    Frequency Min 3X/week   Barriers to discharge Decreased caregiver support      Co-evaluation               AM-PAC PT "6 Clicks" Mobility  Outcome Measure Help needed turning from your back to your side while in a flat bed without using bedrails?: None Help needed moving from lying on your back to sitting on the side of a flat bed without using bedrails?: None Help needed moving to and from a bed to a chair (including a wheelchair)?: A Little Help needed standing up from a chair using your arms (e.g., wheelchair or bedside chair)?: A Little Help needed to walk in hospital room?: A Little Help needed climbing 3-5 steps with a railing? : A Little 6 Click Score: 20    End of Session Equipment Utilized During Treatment: Oxygen Activity Tolerance: Patient limited by fatigue Patient left: in chair;with call bell/phone within reach   PT Visit Diagnosis: Unsteadiness on feet (R26.81);Muscle weakness (generalized) (M62.81)    Time: 3295-1884 PT Time Calculation (min) (ACUTE ONLY): 34 min   Charges:   PT Evaluation $PT Eval Moderate Complexity: 1 Mod PT Treatments $Gait Training: 8-22 mins          Barry Brunner, PT      [place] P Merdith Adan 12/08/2018, 1:06 PM

## 2018-12-08 NOTE — Progress Notes (Signed)
Tele called at 1305, stated pt had 13 beat run SVT at 1140. Notified Dr Candiss Norse. See new orders will continue to monitor. Notified Pt nurse

## 2018-12-08 NOTE — Progress Notes (Signed)
Pt CBG was 540, and recheck again 56  MD notified of elevated CBGs - orders received and completed on Pt. To recheck in 4 hrs - Per MD -  At 5747.Marland Kitchen

## 2018-12-08 NOTE — Progress Notes (Signed)
PROGRESS NOTE                                                                                                                                                                                                             Patient Demographics:    Samantha Freeman, is a 83 y.o. female, DOB - Sep 04, 1935, QPR:[id number]  Outpatient Primary MD for the patient is Idelle Crouch, MD    LOS - 2  Admit date - 12/06/2018    CC - SOB     Brief Narrative  Samantha Freeman is a 83 y.o. female with medical history significant of ckd, htn, dm dx with covid 8/10 presents at [facility] ED with worsening sob.  No n/v/ but some diarrhea, no abd pain.  Has had loss of taste and smell, her son who is works for an ambulance service had recently contracted COVID-57.  She developed some shortness of breath a day prior to admission and was then brought to the hospital where she was diagnosed with COVID-19 pneumonia and admitted.   Subjective:    Patient in bed, appears comfortable, denies any headache, no fever, no chest pain or pressure, no shortness of breath , no abdominal pain. No focal weakness.    Assessment  & Plan :     1. Acute Hypoxic Resp. Failure due to Acute Covid 19 Viral Pneumonitis during the ongoing 2020 Covid 19 Pandemic - she is been appropriately started on combination of IV steroids and Remdisvir.  She has clinically improved and now down to 2.5 L nasal cannula oxygen from 4, she feels better.  Continue monitoring inflammatory markers, requested to sit up in chair use I-S and flutter valve for pulmonary toiletery in the daytime and prone at night when in bed.  Case discussed with patient and her son, both agree for Actemra use if needed.  Understand the risks and benefits and want to proceed with it.  No history of TB or hepatitis.   COVID-19 Labs  Recent Labs    12/06/18 1100 12/06/18 1101 12/07/18 0158 12/08/18 0420  DDIMER   --   --  0.57* 1.75*  FERRITIN 220  --   --  264  LDH  --   --   --  232*  CRP  --  11.1* 12.6* 9.0*    No results found for:  SARSCOV2NAA   Hepatic Function Latest Ref Rng & Units 12/08/2018 12/07/2018 12/06/2018  Total Protein 6.5 - 8.1 g/dL 6.9 6.6 6.7  Albumin 3.5 - 5.0 g/dL 3.1(L) 3.2(L) 3.4(L)  AST 15 - 41 U/L 47(H) 38 37  ALT 0 - 44 U/L 32 26 25  Alk Phosphatase 38 - 126 U/L 27(L) 26(L) 23(L)  Total Bilirubin 0.3 - 1.2 mg/dL 0.5 0.1(L) 0.6  Bilirubin, Direct 0.0 - 0.2 mg/dL - - 0.1        Component Value Date/Time   BNP 111.0 (H) 12/08/2018 0420      2.  CKD 2.  Currently at baseline.  3.  Essential hypertension.  In poor control.  Have increased home dose Norvasc, added hydralazine scheduled orally and PRN IV.  4.  Anxiety and depression.  Continue home medications.  5.  Dyslipidemia.  On statin and fenofibrate combination.  6.  GERD.  PPI.  7.  DM type II.  Poor outpatient control due to hyperglycemia.  Required glucose stabilizer on 12/07/2018.  Placed on Amaryl, Lantus along with sliding scale and pre-meal NovoLog.  Continue to monitor and titrate as needed.  She is also been requested to get diabetes and insulin education as I think it might be needed upon discharge.   Lab Results  Component Value Date   HGBA1C 9.0 (H) 12/07/2018    CBG (last 3)  Recent Labs    12/08/18 0306 12/08/18 0512 12/08/18 0716  GLUCAP 171* 150* 110*      Condition - Extremely Guarded  Family Communication  : Son 12/07/2018 updated in detail, he knows his mom wants to be DNR.  Code Status : DNR per patient's wishes.  Son Laverna Peace aware of her wishes.  Diet :   Diet Order            Diet Carb Modified Fluid consistency: Thin; Room service appropriate? Yes  Diet effective now               Disposition Plan  : Home in 4 to 5 days once better  Consults  :  None  Procedures  :     PUD Prophylaxis :  PPI  DVT Prophylaxis  :  Lovenox started  Lab Results  Component  Value Date   PLT 251 12/08/2018    Inpatient Medications  Scheduled Meds: . amLODipine  10 mg Oral Daily  . aspirin EC  81 mg Oral QODAY  . bisoprolol-hydrochlorothiazide  0.5 tablet Oral Daily  . citalopram  20 mg Oral Daily  . enoxaparin (LOVENOX) injection  40 mg Subcutaneous QHS  . fenofibrate  54 mg Oral Daily  . glimepiride  2 mg Oral BID  . hydrALAZINE  50 mg Oral Q8H  . insulin aspart  0-15 Units Subcutaneous TID WC  . insulin aspart  0-5 Units Subcutaneous QHS  . insulin aspart  3 Units Subcutaneous TID WC  . insulin glargine  25 Units Subcutaneous Daily  . methylPREDNISolone (SOLU-MEDROL) injection  40 mg Intravenous Q12H  . pantoprazole  40 mg Oral Daily  . prednisoLONE acetate  2 drop Both Eyes TID  . rosuvastatin  5 mg Oral Daily  . vitamin B-12  500 mcg Oral Daily  . vitamin C  500 mg Oral Daily  . zinc sulfate  220 mg Oral Daily   Continuous Infusions: . sodium chloride Stopped (12/08/18 0744)  . remdesivir 100 mg in NS 250 mL Stopped (12/07/18 2200)   PRN Meds:.acetaminophen, albuterol, ALPRAZolam, dextrose, hydrALAZINE,  Melatonin, [DISCONTINUED] ondansetron **OR** ondansetron (ZOFRAN) IV  Antibiotics  :    Anti-infectives (From admission, onward)   Start     Dose/Rate Route Frequency Ordered Stop   12/07/18 2200  remdesivir 100 mg in sodium chloride 0.9 % 250 mL IVPB     100 mg 500 mL/hr over 30 Minutes Intravenous Every 24 hours 12/06/18 2009 12/11/18 2159   12/06/18 2200  remdesivir 200 mg in sodium chloride 0.9 % 250 mL IVPB     200 mg 500 mL/hr over 30 Minutes Intravenous Once 12/06/18 2009 12/06/18 2336       Time Spent in minutes  30   Lala Lund M.D on 12/08/2018 at 9:49 AM  To page go to [email or web address] - password [facility]  [facility] -  Office  [phone]  See all Orders from today for further details    Objective:   Vitals:   12/08/18 0430 12/08/18 0725 12/08/18 0752 12/08/18 0800  BP: (!) 174/73  138/70 (!) 154/65   Pulse:  95 69 71  Resp:  (!) 24 (!) 24 (!) 22  Temp: 98.6 F (37 C)   98.4 F (36.9 C)  TempSrc: Oral   Oral  SpO2:  90% 92% 94%  Weight:      Height:        Wt Readings from Last 3 Encounters:  12/06/18 68.5 kg  12/06/18 68.5 kg  05/13/18 70.4 kg     Intake/Output Summary (Last 24 hours) at 12/08/2018 0949 Last data filed at 12/08/2018 0534 Gross per 24 hour  Intake 429.15 ml  Output 501 ml  Net -71.85 ml     Physical Exam  Awake Alert,  No new F.N deficits, Normal affect [place].AT,PERRAL Supple Neck,No JVD, No cervical lymphadenopathy appriciated.  Symmetrical Chest wall movement, Good air movement bilaterally, CTAB RRR,No Gallops, Rubs or new Murmurs, No Parasternal Heave +ve B.Sounds, Abd Soft, No tenderness, No organomegaly appriciated, No rebound - guarding or rigidity. No Cyanosis, Clubbing or edema, No new Rash or bruise     Data Review:    CBC Recent Labs  Lab 12/06/18 1045 12/07/18 0158 12/08/18 0420  WBC 4.2 4.4 4.2  HGB 12.2 11.4* 12.3  HCT 35.9* 35.0* 37.2  PLT 166 175 251  MCV 90.2 92.8 92.1  MCH 30.7 30.2 30.4  MCHC 34.0 32.6 33.1  RDW 12.9 13.0 13.1  LYMPHSABS 1.1 1.3 0.8  MONOABS 0.3 0.4 0.2  EOSABS 0.0 0.0 0.0  BASOSABS 0.0 0.0 0.0    Chemistries  Recent Labs  Lab 12/06/18 1045 12/06/18 1100 12/07/18 0158 12/08/18 0420  NA 131*  --  134* 138  K 3.8  --  4.0 4.3  CL 95*  --  98 99  CO2 24  --  25 27  GLUCOSE 218*  --  343* 146*  BUN 20  --  22 31*  CREATININE 1.02*  --  1.08* 1.01*  CALCIUM 8.2*  --  8.4* 8.9  MG  --   --   --  1.8  AST  --  37 38 47*  ALT  --  25 26 32  ALKPHOS  --  23* 26* 27*  BILITOT  --  0.6 0.1* 0.5   ------------------------------------------------------------------------------------------------------------------ No results for input(s): CHOL, HDL, LDLCALC, TRIG, CHOLHDL, LDLDIRECT in the last 72 hours.  Lab Results  Component Value Date   HGBA1C 9.0 (H) 12/07/2018    ------------------------------------------------------------------------------------------------------------------ No results for input(s): TSH, T4TOTAL, T3FREE, THYROIDAB in the last 72 hours.  Invalid input(s): FREET3  Cardiac Enzymes No results for input(s): CKMB, TROPONINI, MYOGLOBIN in the last 168 hours.  Invalid input(s): CK ------------------------------------------------------------------------------------------------------------------    Component Value Date/Time   BNP 111.0 (H) 12/08/2018 9692    Micro Results No results found for this or any previous visit (from the past 240 hour(s)).  Radiology Reports Dg Chest Portable 1 View  Result Date: 12/06/2018 CLINICAL DATA:  Fever and shortness of breath. EXAM: PORTABLE CHEST 1 VIEW COMPARISON:  05/13/2018 FINDINGS: New interstitial and airspace densities in the right upper lung. There is also concern for acute on chronic densities in the left upper lung. Heart size is within normal limits and stable. Atherosclerotic calcifications at the aortic arch. Trachea is midline. Linear density at the right lung base is suggestive for atelectasis. IMPRESSION: New densities in both lungs.  Findings are concerning for pneumonia. Electronically Signed   By: Markus Daft M.D.   On: 12/06/2018 11:28

## 2018-12-08 NOTE — Progress Notes (Signed)
Pt's CBG was check at 1550, and elevated over 412 and 449, MD was notified of elevated CBG - orders received for 25 units of Novolog and ordered to recheck in 4 hours, 2000. Reported off to  Night RN to recheck CBG at 2000.

## 2018-12-09 ENCOUNTER — Encounter (HOSPITAL_COMMUNITY): Payer: Self-pay

## 2018-12-09 LAB — COMPREHENSIVE METABOLIC PANEL
ALT: 34 U/L (ref 0–44)
AST: 42 U/L — ABNORMAL HIGH (ref 15–41)
Albumin: 3 g/dL — ABNORMAL LOW (ref 3.5–5.0)
Alkaline Phosphatase: 27 U/L — ABNORMAL LOW (ref 38–126)
Anion gap: 10 (ref 5–15)
BUN: 36 mg/dL — ABNORMAL HIGH (ref 8–23)
CO2: 26 mmol/L (ref 22–32)
Calcium: 8.7 mg/dL — ABNORMAL LOW (ref 8.9–10.3)
Chloride: 100 mmol/L (ref 98–111)
Creatinine, Ser: 0.85 mg/dL (ref 0.44–1.00)
GFR calc Af Amer: >60 mL/min (ref >60)
GFR calc non Af Amer: >60 mL/min (ref >60)
Glucose, Bld: 142 mg/dL — ABNORMAL HIGH (ref 70–99)
Potassium: 3.9 mmol/L (ref 3.5–5.1)
Sodium: 136 mmol/L (ref 135–145)
Total Bilirubin: 0.3 mg/dL (ref 0.3–1.2)
Total Protein: 6.2 g/dL — ABNORMAL LOW (ref 6.5–8.1)

## 2018-12-09 LAB — GLUCOSE, CAPILLARY
Glucose-Capillary: 56 mg/dL — ABNORMAL LOW (ref 70–99)
Glucose-Capillary: 268 mg/dL — ABNORMAL HIGH (ref 70–99)
Glucose-Capillary: 566 mg/dL (ref 70–99)
Glucose-Capillary: 388 mg/dL — ABNORMAL HIGH (ref 70–99)

## 2018-12-09 LAB — CBC WITH DIFFERENTIAL/PLATELET
Abs Immature Granulocytes: 0.04 10*3/uL (ref 0.00–0.07)
Basophils Absolute: 0 10*3/uL (ref 0.0–0.1)
Basophils Relative: 0 %
Eosinophils Absolute: 0 10*3/uL (ref 0.0–0.5)
Eosinophils Relative: 0 %
HCT: 35.5 % — ABNORMAL LOW (ref 36.0–46.0)
Hemoglobin: 11.6 g/dL — ABNORMAL LOW (ref 12.0–15.0)
Immature Granulocytes: 1 %
Lymphocytes Relative: 18 %
Lymphs Abs: 1.2 10*3/uL (ref 0.7–4.0)
MCH: 30.4 pg (ref 26.0–34.0)
MCHC: 32.7 g/dL (ref 30.0–36.0)
MCV: 92.9 fL (ref 80.0–100.0)
Monocytes Absolute: 0.6 10*3/uL (ref 0.1–1.0)
Monocytes Relative: 8 %
Neutro Abs: 5.1 10*3/uL (ref 1.7–7.7)
Neutrophils Relative %: 73 %
Platelets: 272 10*3/uL (ref 150–400)
RBC: 3.82 MIL/uL — ABNORMAL LOW (ref 3.87–5.11)
RDW: 13.2 % (ref 11.5–15.5)
WBC: 6.9 10*3/uL (ref 4.0–10.5)
nRBC: 0 % (ref 0.0–0.2)

## 2018-12-09 LAB — FERRITIN: Ferritin: 267 ng/mL (ref 11–307)

## 2018-12-09 LAB — C-REACTIVE PROTEIN: CRP: 4.3 mg/dL — ABNORMAL HIGH (ref <1.0)

## 2018-12-09 LAB — TSH: TSH: 0.58 u[IU]/mL (ref 0.350–4.500)

## 2018-12-09 LAB — LACTATE DEHYDROGENASE: LDH: 255 U/L — ABNORMAL HIGH (ref 98–192)

## 2018-12-09 LAB — MAGNESIUM: Magnesium: 1.9 mg/dL (ref 1.7–2.4)

## 2018-12-09 LAB — BRAIN NATRIURETIC PEPTIDE: B Natriuretic Peptide: 178.9 pg/mL — ABNORMAL HIGH (ref 0.0–100.0)

## 2018-12-09 LAB — D-DIMER, QUANTITATIVE: D-Dimer, Quant: 0.57 ug/mL-FEU — ABNORMAL HIGH (ref 0.00–0.50)

## 2018-12-09 MED ORDER — INSULIN ASPART 100 UNIT/ML ~~LOC~~ SOLN
0.0000 [IU] | Freq: Three times a day (TID) | SUBCUTANEOUS | Status: DC
  Administered 2018-12-09: 18:00:00 9 [IU] via SUBCUTANEOUS
  Administered 2018-12-09: 12:00:00 5 [IU] via SUBCUTANEOUS
  Administered 2018-12-10: 12:00:00 7 [IU] via SUBCUTANEOUS
  Administered 2018-12-11: 09:00:00 2 [IU] via SUBCUTANEOUS
  Administered 2018-12-11: 12:00:00 9 [IU] via SUBCUTANEOUS

## 2018-12-09 MED ORDER — INSULIN ASPART 100 UNIT/ML ~~LOC~~ SOLN
25.0000 [IU] | Freq: Once | SUBCUTANEOUS | Status: AC
  Administered 2018-12-09: 18:00:00 25 [IU] via SUBCUTANEOUS

## 2018-12-09 MED ORDER — INSULIN GLARGINE 100 UNIT/ML ~~LOC~~ SOLN
35.0000 [IU] | Freq: Every day | SUBCUTANEOUS | Status: DC
  Administered 2018-12-09: 12:00:00 35 [IU] via SUBCUTANEOUS
  Filled 2018-12-09 (×2): qty 0.35

## 2018-12-09 MED ORDER — INSULIN ASPART 100 UNIT/ML ~~LOC~~ SOLN
0.0000 [IU] | Freq: Every day | SUBCUTANEOUS | Status: DC
  Administered 2018-12-09: 22:00:00 5 [IU] via SUBCUTANEOUS

## 2018-12-09 MED ORDER — GLIMEPIRIDE 2 MG PO TABS
2.0000 mg | ORAL_TABLET | Freq: Two times a day (BID) | ORAL | Status: DC
  Administered 2018-12-09 – 2018-12-11 (×3): 2 mg via ORAL
  Filled 2018-12-09 (×7): qty 1

## 2018-12-09 NOTE — Progress Notes (Signed)
0800  CGB 56.  Asymptomatic.  Orange juice provided.  Will recheck

## 2018-12-09 NOTE — Evaluation (Signed)
Occupational Therapy Evaluation Patient Details Name: Shahidah Nesbitt MRN: [id number] DOB: 28-Jul-1935 Today's Date: 12/09/2018    History of Present Illness 83 y.o. female with medical history significant of rt TKR, rt RTC repeair, ckd, htn, dm dx with covid 8/10 presents at [facility] ED with worsening sob.  No n/v/ but some diarrhea. Bil pna   Clinical Impression   PTA, pt was living alone and was independent; reports her son stays several nights a week when working in [place]. Pt currently performs ADLs and functional transfers at [place] level. Pt presenting with decreased strength and activity tolerance compared to PLOF. Pr SpO2 >88% on 2L O2 during OOB activity. Pt would benefit from further acute OT to facilitate safe dc. Recommend dc to home with HHOT for further OT to optimize safety, independence with ADLs, and return to PLOF.   Follow Up Recommendations  Home health OT;Supervision - Intermittent    Equipment Recommendations  None recommended by OT    Recommendations for Other Services PT consult     Precautions / Restrictions Precautions Precautions: Fall      Mobility Bed Mobility               General bed mobility comments: At EOB with RN upon arrival  Transfers Overall transfer level: Needs assistance Equipment used: None Transfers: Sit to/from Stand;Stand Pivot Transfers Sit to Stand: Supervision Stand pivot transfers: Min guard       General transfer comment: Min Guard A for safety    Balance Overall balance assessment: Needs assistance Sitting-balance support: No upper extremity supported;Feet supported Sitting balance-Leahy Scale: Good     Standing balance support: No upper extremity supported Standing balance-Leahy Scale: Fair                             ADL either performed or assessed with clinical judgement   ADL Overall ADL's : Needs assistance/impaired Eating/Feeding: Independent;Sitting   Grooming: Set  up;Supervision/safety;Sitting   Upper Body Bathing: Set up;Supervision/ safety;Sitting   Lower Body Bathing: Min guard;Sit to/from stand   Upper Body Dressing : Set up;Supervision/safety;Sitting   Lower Body Dressing: Min guard;Sit to/from stand Lower Body Dressing Details (indicate cue type and reason): Pt donning socks using figure four method. Min Guard A for safety in standing Toilet Transfer: Min guard;Stand-pivot(simulated to Psychologist, occupational Details (indicate cue type and reason): Min Guard A for safety         Functional mobility during ADLs: Min guard General ADL Comments: Pt demonstrating decreased strength and activity tolerance compared to baseline function. Very motivated to participate in therapy.      Vision         Perception     Praxis      Pertinent Vitals/Pain Pain Assessment: No/denies pain     Hand Dominance Right   Extremity/Trunk Assessment Upper Extremity Assessment Upper Extremity Assessment: Generalized weakness   Lower Extremity Assessment Lower Extremity Assessment: Defer to PT evaluation   Cervical / Trunk Assessment Cervical / Trunk Assessment: Normal   Communication Communication Communication: No difficulties   Cognition Arousal/Alertness: Awake/alert Behavior During Therapy: WFL for tasks assessed/performed Overall Cognitive Status: Impaired/Different from baseline Area of Impairment: Problem solving                             Problem Solving: Slow processing General Comments: Pt requiring increased time for processing. Noting duirng simple questions  about home, pt requiring increased time to recall information. Unsure if this is baseline   General Comments  SpO2 >88% on 2L O2.     Exercises Exercises: Other exercises Other Exercises Other Exercises: Pt demonstrating understanding for use of flutter and IS   Shoulder Instructions      Home Living Family/patient expects to be discharged to::  Private residence Living Arrangements: Alone Available Help at Discharge: Family;Available PRN/intermittently Type of Home: House Home Access: Level entry     Home Layout: One level     Bathroom Shower/Tub: Occupational psychologist: Handicapped height     Home Equipment: Environmental consultant - 2 wheels;Cane - single point;Shower seat          Prior Functioning/Environment Level of Independence: Independent        Comments: drives, does grocery shopping (more difficult recently due to SOB)        OT Problem List: Decreased strength;Decreased activity tolerance;Impaired balance (sitting and/or standing);Decreased knowledge of use of DME or AE;Decreased knowledge of precautions;Cardiopulmonary status limiting activity      OT Treatment/Interventions: Self-care/ADL training;Therapeutic exercise;Energy conservation;DME and/or AE instruction;Therapeutic activities;Patient/family education    OT Goals(Current goals can be found in the care plan section) Acute Rehab OT Goals Patient Stated Goal: return home without needing assistive device to walk OT Goal Formulation: With patient Time For Goal Achievement: 12/23/18 Potential to Achieve Goals: Good  OT Frequency: Min 3X/week   Barriers to D/C:            Co-evaluation              AM-PAC OT "6 Clicks" Daily Activity     Outcome Measure Help from another person eating meals?: None Help from another person taking care of personal grooming?: None Help from another person toileting, which includes using toliet, bedpan, or urinal?: A Little Help from another person bathing (including washing, rinsing, drying)?: A Little Help from another person to put on and taking off regular upper body clothing?: None Help from another person to put on and taking off regular lower body clothing?: A Little 6 Click Score: 21   End of Session Equipment Utilized During Treatment: Oxygen(2L) Nurse Communication: Mobility status  Activity  Tolerance: Patient tolerated treatment well Patient left: in chair;with call bell/phone within reach  OT Visit Diagnosis: Unsteadiness on feet (R26.81);Other abnormalities of gait and mobility (R26.89);Muscle weakness (generalized) (M62.81)                Time: 8144-8185 OT Time Calculation (min): 15 min Charges:  OT General Charges $OT Visit: 1 Visit OT Evaluation $OT Eval Low Complexity: [place], OTR/L Acute Rehab Pager: [phone] Office: [place] 12/09/2018, 10:38 AM

## 2018-12-09 NOTE — Progress Notes (Signed)
Called and updated son Laverna Peace on the phone.  All questions/concerns addressed.  He is appreciative of our care for his mother.

## 2018-12-09 NOTE — Progress Notes (Addendum)
PROGRESS NOTE                                                                                                                                                                                                             Patient Demographics:    Samantha Freeman, is a 83 y.o. female, DOB - 09/29/1935, KZL:[id number]  Outpatient Primary MD for the patient is Idelle Crouch, MD    LOS - 3  Admit date - 12/06/2018    CC - SOB     Brief Narrative  Samantha Freeman is a 83 y.o. female with medical history significant of ckd, htn, dm dx with covid 8/10 presents at [facility] ED with worsening sob.  No n/v/ but some diarrhea, no abd pain.  Has had loss of taste and smell, her son who is works for an ambulance service had recently contracted COVID-98.  She developed some shortness of breath a day prior to admission and was then brought to the hospital where she was diagnosed with COVID-19 pneumonia and admitted.   Subjective:   Patient in bed, appears comfortable, denies any headache, no fever, no chest pain or pressure, no shortness of breath , no abdominal pain. No focal weakness.   Assessment  & Plan :     1. Acute Hypoxic Resp. Failure due to Acute Covid 19 Viral Pneumonitis during the ongoing 2020 Covid 19 Pandemic - she is been appropriately started on combination of IV steroids and Remdisvir.  She has clinically improved and now down to 2 L nasal cannula oxygen from 4, she feels better.  Continue monitoring inflammatory markers, requested to sit up in chair use I-S and flutter valve for pulmonary toiletery in the daytime and prone at night when in bed.  Case discussed with patient and her son, both agree for Actemra use if needed.  Understand the risks and benefits and want to proceed with it.  No history of TB or hepatitis.   COVID-19 Labs  Recent Labs    12/06/18 1100  12/07/18 0158 12/08/18 0420 12/09/18 0306  DDIMER  --    --  0.57* 1.75* 0.57*  FERRITIN 220  --   --  264 267  LDH  --   --   --  232* 255*  CRP  --    < > 12.6* 9.0* 4.3*   < > =  values in this interval not displayed.    No results found for: SARSCOV2NAA   Hepatic Function Latest Ref Rng & Units 12/09/2018 12/08/2018 12/07/2018  Total Protein 6.5 - 8.1 g/dL 6.2(L) 6.9 6.6  Albumin 3.5 - 5.0 g/dL 3.0(L) 3.1(L) 3.2(L)  AST 15 - 41 U/L 42(H) 47(H) 38  ALT 0 - 44 U/L 34 32 26  Alk Phosphatase 38 - 126 U/L 27(L) 27(L) 26(L)  Total Bilirubin 0.3 - 1.2 mg/dL 0.3 0.5 0.1(L)  Bilirubin, Direct 0.0 - 0.2 mg/dL - - -        Component Value Date/Time   BNP 178.9 (H) 12/09/2018 0302      2.  CKD 2.  Currently at baseline.  3.  Essential hypertension.  In poor control.  Have increased home dose Norvasc, added hydralazine scheduled orally and PRN IV.  4.  Anxiety and depression.  Continue home medications.  5.  Dyslipidemia.  On statin and fenofibrate combination.  6.  GERD.  PPI.  7.  DM type II.  Poor outpatient control due to hyperglycemia.  Required glucose stabilizer on 12/07/2018.  3 new labile sugars and difficult to control with tapering steroids, currently on Amaryl Lantus and sliding scale along with pre-meal NovoLog all dosages adjusted on 12/09/2018 will monitor closely.   Lab Results  Component Value Date   HGBA1C 9.0 (H) 12/07/2018    CBG (last 3)  Recent Labs    12/08/18 1555 12/08/18 2013 12/09/18 0759  GLUCAP 449* 371* 56*      Condition - Extremely Guarded  Family Communication  : Son 12/07/2018 updated in detail, he knows his mom wants to be DNR, again on 12/09/18.  Code Status : DNR per patient's wishes.  Son Samantha Freeman aware of her wishes.  Diet :   Diet Order            Diet Carb Modified Fluid consistency: Thin; Room service appropriate? Yes  Diet effective now               Disposition Plan  : Home likely on 12/11/2018 per patient's wishes.  Home health PT ordered.  Consults  :  None  Procedures  :      PUD Prophylaxis :  PPI  DVT Prophylaxis  :  Lovenox started  Lab Results  Component Value Date   PLT 272 12/09/2018    Inpatient Medications  Scheduled Meds: . amLODipine  10 mg Oral Daily  . aspirin EC  81 mg Oral QODAY  . citalopram  20 mg Oral Daily  . enoxaparin (LOVENOX) injection  40 mg Subcutaneous QHS  . fenofibrate  54 mg Oral Daily  . glimepiride  2 mg Oral BID  . hydrALAZINE  50 mg Oral Q8H  . insulin aspart  0-5 Units Subcutaneous QHS  . insulin aspart  0-9 Units Subcutaneous TID WC  . [START ON 12/10/2018] insulin glargine  35 Units Subcutaneous Daily  . methylPREDNISolone (SOLU-MEDROL) injection  40 mg Intravenous Daily  . metoprolol tartrate  25 mg Oral BID  . pantoprazole  40 mg Oral Daily  . prednisoLONE acetate  2 drop Both Eyes TID  . rosuvastatin  5 mg Oral Daily  . vitamin B-12  500 mcg Oral Daily  . vitamin C  500 mg Oral Daily  . zinc sulfate  220 mg Oral Daily   Continuous Infusions: . sodium chloride Stopped (12/08/18 0744)  . remdesivir 100 mg in NS 250 mL Stopped (12/09/18 0558)   PRN Meds:.acetaminophen,  albuterol, ALPRAZolam, dextrose, hydrALAZINE, Melatonin, [DISCONTINUED] ondansetron **OR** ondansetron (ZOFRAN) IV  Antibiotics  :    Anti-infectives (From admission, onward)   Start     Dose/Rate Route Frequency Ordered Stop   12/07/18 2200  remdesivir 100 mg in sodium chloride 0.9 % 250 mL IVPB     100 mg 500 mL/hr over 30 Minutes Intravenous Every 24 hours 12/06/18 2009 12/11/18 2159   12/06/18 2200  remdesivir 200 mg in sodium chloride 0.9 % 250 mL IVPB     200 mg 500 mL/hr over 30 Minutes Intravenous Once 12/06/18 2009 12/06/18 2336       Time Spent in minutes  30   Lala Lund M.D on 12/09/2018 at 10:15 AM  To page go to [email or web address] - password [facility]  [facility] -  Office  [phone]  See all Orders from today for further details    Objective:   Vitals:   12/09/18 0751 12/09/18 0752 12/09/18 0753  12/09/18 0756  BP:    (!) 146/72  Pulse: 60 60 (!) 58 65  Resp: (!) 24 (!) 24 (!) 26 (!) 21  Temp:    98.4 F (36.9 C)  TempSrc:    Oral  SpO2: 93% 92% 92% 92%  Weight:      Height:        Wt Readings from Last 3 Encounters:  12/06/18 68.5 kg  12/06/18 68.5 kg  05/13/18 70.4 kg     Intake/Output Summary (Last 24 hours) at 12/09/2018 1015 Last data filed at 12/08/2018 2200 Gross per 24 hour  Intake -  Output 300 ml  Net -300 ml     Physical Exam  Awake Alert,  No new F.N deficits, Normal affect [place].AT,PERRAL Supple Neck,No JVD, No cervical lymphadenopathy appriciated.  Symmetrical Chest wall movement, Good air movement bilaterally, CTAB RRR,No Gallops, Rubs or new Murmurs, No Parasternal Heave +ve B.Sounds, Abd Soft, No tenderness, No organomegaly appriciated, No rebound - guarding or rigidity. No Cyanosis, Clubbing or edema, No new Rash or bruise     Data Review:    CBC Recent Labs  Lab 12/06/18 1045 12/07/18 0158 12/08/18 0420 12/09/18 0306  WBC 4.2 4.4 4.2 6.9  HGB 12.2 11.4* 12.3 11.6*  HCT 35.9* 35.0* 37.2 35.5*  PLT 166 175 251 272  MCV 90.2 92.8 92.1 92.9  MCH 30.7 30.2 30.4 30.4  MCHC 34.0 32.6 33.1 32.7  RDW 12.9 13.0 13.1 13.2  LYMPHSABS 1.1 1.3 0.8 1.2  MONOABS 0.3 0.4 0.2 0.6  EOSABS 0.0 0.0 0.0 0.0  BASOSABS 0.0 0.0 0.0 0.0    Chemistries  Recent Labs  Lab 12/06/18 1045 12/06/18 1100 12/07/18 0158 12/08/18 0420 12/09/18 0306  NA 131*  --  134* 138 136  K 3.8  --  4.0 4.3 3.9  CL 95*  --  98 99 100  CO2 24  --  _0 GLUCOSE 218*  --  343* 146* 142*  BUN 20  --  22 31* 36*  CREATININE 1.02*  --  1.08* 1.01* 0.85  CALCIUM 8.2*  --  8.4* 8.9 8.7*  MG  --   --   --  1.8 1.9  AST  --  37 38 47* 42*  ALT  --  25 26 32 34  ALKPHOS  --  23* 26* 27* 27*  BILITOT  --  0.6 0.1* 0.5 0.3   ------------------------------------------------------------------------------------------------------------------ No results for input(s):  CHOL, HDL, LDLCALC, TRIG, CHOLHDL, LDLDIRECT in the last 72 hours.  Lab Results  Component Value Date   HGBA1C 9.0 (H) 12/07/2018   ------------------------------------------------------------------------------------------------------------------ Recent Labs    12/08/18 0420  TSH 0.580    Cardiac Enzymes No results for input(s): CKMB, TROPONINI, MYOGLOBIN in the last 168 hours.  Invalid input(s): CK ------------------------------------------------------------------------------------------------------------------    Component Value Date/Time   BNP 178.9 (H) 12/09/2018 0302    Micro Results No results found for this or any previous visit (from the past 240 hour(s)).  Radiology Reports Dg Chest Portable 1 View  Result Date: 12/06/2018 CLINICAL DATA:  Fever and shortness of breath. EXAM: PORTABLE CHEST 1 VIEW COMPARISON:  05/13/2018 FINDINGS: New interstitial and airspace densities in the right upper lung. There is also concern for acute on chronic densities in the left upper lung. Heart size is within normal limits and stable. Atherosclerotic calcifications at the aortic arch. Trachea is midline. Linear density at the right lung base is suggestive for atelectasis. IMPRESSION: New densities in both lungs.  Findings are concerning for pneumonia. Electronically Signed   By: Markus Daft M.D.   On: 12/06/2018 11:28

## 2018-12-09 NOTE — Progress Notes (Addendum)
Inpatient Diabetes Program Recommendations  AACE/ADA: New Consensus Statement on Inpatient Glycemic Control (2015)  Target Ranges:  Prepandial:   less than 140 mg/dL      Peak postprandial:   less than 180 mg/dL (1-2 hours)      Critically ill patients:  140 - 180 mg/dL   Lab Results  Component Value Date   GLUCAP 56 (L) 12/09/2018   HGBA1C 9.0 (H) 12/07/2018    Review of Glycemic Control  Diabetes history: DM 2 Outpatient Diabetes medications: Glimepiride 2 mg bid, Metformin 1000 mg bid, Semaglutide 14 mg tablet Daily or placebo (research study medication), Januvia 50 mg Daily  Current orders for Inpatient glycemic control:  Lantus 45 units Daily Novolog 0-20 units tid Novolog 0-5 units qhs Novolog 5 units tid meal coverage Glimepiride 2 mg bid  Solumedrol 40 mg Daily  Consult for insulin and DM teaching  A1c 9% on 8/16.   Patient on oral meds at home. Patient just admitted. Will follow patient through hospital stay and will educate when more appropriate, closer to discharge so patient will have recent information to remember.  Noted hypoglycemia this am of 56 mg/dl. Dr. Candiss Norse following.  Consider increasing Novolog meal coverage to 15 units.  Thanks,  Tama Headings RN, MSN, BC-ADM Inpatient Diabetes Coordinator Team Pager [phone] (8a-5p)

## 2018-12-09 NOTE — Plan of Care (Signed)
POC reviewed with patient 

## 2018-12-09 NOTE — Progress Notes (Signed)
Pt has been resting well t/o the night. She denies pain, nhausea and SOB. She remains on 2L/[place], VSS, call light withn reach

## 2018-12-09 NOTE — Plan of Care (Signed)
  Problem: Education: Goal: Knowledge of risk factors and measures for prevention of condition will improve Outcome: Progressing   Problem: Coping: Goal: Psychosocial and spiritual needs will be supported Outcome: Progressing   Problem: Respiratory: Goal: Will maintain a patent airway Outcome: Progressing Goal: Complications related to the disease process, condition or treatment will be avoided or minimized Outcome: Progressing   

## 2018-12-10 LAB — COMPREHENSIVE METABOLIC PANEL
ALT: 34 U/L (ref 0–44)
AST: 33 U/L (ref 15–41)
Albumin: 2.8 g/dL — ABNORMAL LOW (ref 3.5–5.0)
Alkaline Phosphatase: 30 U/L — ABNORMAL LOW (ref 38–126)
Anion gap: 9 (ref 5–15)
BUN: 34 mg/dL — ABNORMAL HIGH (ref 8–23)
CO2: 27 mmol/L (ref 22–32)
Calcium: 8.8 mg/dL — ABNORMAL LOW (ref 8.9–10.3)
Chloride: 101 mmol/L (ref 98–111)
Creatinine, Ser: 0.79 mg/dL (ref 0.44–1.00)
GFR calc Af Amer: >60 mL/min (ref >60)
GFR calc non Af Amer: >60 mL/min (ref >60)
Glucose, Bld: 160 mg/dL — ABNORMAL HIGH (ref 70–99)
Potassium: 3.8 mmol/L (ref 3.5–5.1)
Sodium: 137 mmol/L (ref 135–145)
Total Bilirubin: 0.5 mg/dL (ref 0.3–1.2)
Total Protein: 6.1 g/dL — ABNORMAL LOW (ref 6.5–8.1)

## 2018-12-10 LAB — GLUCOSE, CAPILLARY
Glucose-Capillary: 44 mg/dL — CL (ref 70–99)
Glucose-Capillary: 128 mg/dL — ABNORMAL HIGH (ref 70–99)
Glucose-Capillary: 348 mg/dL — ABNORMAL HIGH (ref 70–99)
Glucose-Capillary: 444 mg/dL — ABNORMAL HIGH (ref 70–99)
Glucose-Capillary: 412 mg/dL — ABNORMAL HIGH (ref 70–99)

## 2018-12-10 LAB — CBC WITH DIFFERENTIAL/PLATELET
Abs Immature Granulocytes: 0.03 10*3/uL (ref 0.00–0.07)
Basophils Absolute: 0 10*3/uL (ref 0.0–0.1)
Basophils Relative: 0 %
Eosinophils Absolute: 0 10*3/uL (ref 0.0–0.5)
Eosinophils Relative: 0 %
HCT: 33.7 % — ABNORMAL LOW (ref 36.0–46.0)
Hemoglobin: 11.2 g/dL — ABNORMAL LOW (ref 12.0–15.0)
Immature Granulocytes: 1 %
Lymphocytes Relative: 27 %
Lymphs Abs: 1.5 10*3/uL (ref 0.7–4.0)
MCH: 30.7 pg (ref 26.0–34.0)
MCHC: 33.2 g/dL (ref 30.0–36.0)
MCV: 92.3 fL (ref 80.0–100.0)
Monocytes Absolute: 0.5 10*3/uL (ref 0.1–1.0)
Monocytes Relative: 10 %
Neutro Abs: 3.4 10*3/uL (ref 1.7–7.7)
Neutrophils Relative %: 62 %
Platelets: 298 10*3/uL (ref 150–400)
RBC: 3.65 MIL/uL — ABNORMAL LOW (ref 3.87–5.11)
RDW: 13 % (ref 11.5–15.5)
WBC: 5.4 10*3/uL (ref 4.0–10.5)
nRBC: 0 % (ref 0.0–0.2)

## 2018-12-10 LAB — BRAIN NATRIURETIC PEPTIDE: B Natriuretic Peptide: 131.2 pg/mL — ABNORMAL HIGH (ref 0.0–100.0)

## 2018-12-10 LAB — C-REACTIVE PROTEIN: CRP: 3 mg/dL — ABNORMAL HIGH (ref <1.0)

## 2018-12-10 LAB — FERRITIN: Ferritin: 217 ng/mL (ref 11–307)

## 2018-12-10 LAB — D-DIMER, QUANTITATIVE: D-Dimer, Quant: 0.53 ug/mL-FEU — ABNORMAL HIGH (ref 0.00–0.50)

## 2018-12-10 LAB — MAGNESIUM: Magnesium: 1.8 mg/dL (ref 1.7–2.4)

## 2018-12-10 LAB — LACTATE DEHYDROGENASE: LDH: 211 U/L — ABNORMAL HIGH (ref 98–192)

## 2018-12-10 MED ORDER — INSULIN GLARGINE 100 UNIT/ML ~~LOC~~ SOLN
40.0000 [IU] | Freq: Every day | SUBCUTANEOUS | Status: DC
  Administered 2018-12-10: 11:00:00 40 [IU] via SUBCUTANEOUS
  Filled 2018-12-10 (×2): qty 0.4

## 2018-12-10 MED ORDER — FUROSEMIDE 10 MG/ML IJ SOLN
40.0000 mg | Freq: Once | INTRAMUSCULAR | Status: AC
  Administered 2018-12-10: 11:00:00 40 mg via INTRAVENOUS
  Filled 2018-12-10: qty 4

## 2018-12-10 MED ORDER — INSULIN ASPART 100 UNIT/ML ~~LOC~~ SOLN
10.0000 [IU] | Freq: Once | SUBCUTANEOUS | Status: AC
  Administered 2018-12-10: 21:00:00 10 [IU] via SUBCUTANEOUS

## 2018-12-10 MED ORDER — METHYLPREDNISOLONE SODIUM SUCC 40 MG IJ SOLR
30.0000 mg | Freq: Every day | INTRAMUSCULAR | Status: DC
  Administered 2018-12-10: 11:00:00 30 mg via INTRAVENOUS
  Filled 2018-12-10: qty 1

## 2018-12-10 MED ORDER — INSULIN ASPART 100 UNIT/ML ~~LOC~~ SOLN: 4.0000 [IU] | Freq: Three times a day (TID) | SUBCUTANEOUS | Status: DC

## 2018-12-10 MED ORDER — INSULIN REGULAR HUMAN 100 UNIT/ML IJ SOLN: 4.0000 [IU] | Freq: Three times a day (TID) | INTRAMUSCULAR | Status: DC

## 2018-12-10 MED ORDER — METOPROLOL TARTRATE 50 MG PO TABS
50.0000 mg | ORAL_TABLET | Freq: Two times a day (BID) | ORAL | Status: DC
  Administered 2018-12-10 – 2018-12-11 (×2): 50 mg via ORAL
  Filled 2018-12-10 (×2): qty 1

## 2018-12-10 MED ORDER — LOPERAMIDE HCL 2 MG PO CAPS
2.0000 mg | ORAL_CAPSULE | Freq: Four times a day (QID) | ORAL | Status: DC | PRN
  Administered 2018-12-10: 2 mg via ORAL
  Filled 2018-12-10 (×2): qty 1

## 2018-12-10 MED ORDER — SALINE SPRAY 0.65 % NA SOLN
1.0000 | NASAL | Status: DC | PRN
  Administered 2018-12-10: 18:00:00 1 via NASAL
  Filled 2018-12-10: qty 44

## 2018-12-10 MED ORDER — INSULIN ASPART 100 UNIT/ML ~~LOC~~ SOLN
25.0000 [IU] | Freq: Once | SUBCUTANEOUS | Status: AC
  Administered 2018-12-10: 17:00:00 25 [IU] via SUBCUTANEOUS

## 2018-12-10 MED ORDER — METOPROLOL TARTRATE 25 MG PO TABS
25.0000 mg | ORAL_TABLET | Freq: Once | ORAL | Status: AC
  Administered 2018-12-10: 25 mg via ORAL
  Filled 2018-12-10: qty 1

## 2018-12-10 NOTE — Progress Notes (Signed)
PROGRESS NOTE                                                                                                                                                                                                             Patient Demographics:    Samantha Freeman, is a 83 y.o. female, DOB - January 10, 1936, RCV:[id number]  Outpatient Primary MD for the patient is Idelle Crouch, MD    LOS - 4  Admit date - 12/06/2018    CC - SOB     Brief Narrative  Samantha Freeman is a 83 y.o. female with medical history significant of ckd, htn, dm dx with covid 8/10 presents at [facility] ED with worsening sob.  No n/v/ but some diarrhea, no abd pain.  Has had loss of taste and smell, her son who is works for an ambulance service had recently contracted COVID-66.  She developed some shortness of breath a day prior to admission and was then brought to the hospital where she was diagnosed with COVID-19 pneumonia and admitted.   Subjective:   Patient in bed, appears comfortable, denies any headache, no fever, no chest pain or pressure, no shortness of breath , no abdominal pain. No focal weakness.   Assessment  & Plan :     1. Acute Hypoxic Resp. Failure due to Acute Covid 19 Viral Pneumonitis during the ongoing 2020 Covid 19 Pandemic - she is been appropriately started on combination of IV steroids and Remdisvir.  She has clinically improved and now down to 2 L nasal cannula oxygen from 4, she feels better.  Continue monitoring inflammatory markers, requested to sit up in chair use I-S and flutter valve for pulmonary toiletery in the daytime and prone at night when in bed.  Case discussed with patient and her son, both agree for Actemra use if needed.  Understand the risks and benefits and want to proceed with it.  No history of TB or hepatitis.   COVID-19 Labs  Recent Labs    12/08/18 0420 12/09/18 0306 12/10/18 0315  DDIMER 1.75* 0.57* 0.53*   FERRITIN 264 267 217  LDH 232* 255* 211*  CRP 9.0* 4.3* 3.0*    No results found for: SARSCOV2NAA   Hepatic Function Latest Ref Rng & Units 12/10/2018 12/09/2018 12/08/2018  Total Protein 6.5 - 8.1 g/dL 6.1(L) 6.2(L) 6.9  Albumin 3.5 - 5.0 g/dL 2.8(L) 3.0(L) 3.1(L)  AST 15 - 41 U/L 33 42(H) 47(H)  ALT 0 - 44 U/L 34 34 32  Alk Phosphatase 38 - 126 U/L 30(L) 27(L) 27(L)  Total Bilirubin 0.3 - 1.2 mg/dL 0.5 0.3 0.5  Bilirubin, Direct 0.0 - 0.2 mg/dL - - -        Component Value Date/Time   BNP 131.2 (H) 12/10/2018 0315      2.  CKD 2.  Currently at baseline.  3.  Essential hypertension.  In poor control.  Currently on combination of Norvasc, Lopressor and hydralazine, Lopressor dose increased and continue the use of IV hydralazine as needed as needed.  Dose of IV Lasix on 12/10/2018 as well.  4.  Anxiety and depression.  Continue home medications.  5.  Dyslipidemia.  On statin and fenofibrate combination.  6.  GERD.  PPI.  7.  DM type II.  Poor outpatient control due to hyperglycemia.  Required glucose stabilizer on 12/07/2018.  Her sugars are extremely labile and difficult to control with tapering steroids, currently on Amaryl, Lantus and sliding scale have stopped pre-meal NovoLog on 12/10/2018.  As steroids being tapered.  Sugars remain extremely labile.  Will monitor closely.   Lab Results  Component Value Date   HGBA1C 9.0 (H) 12/07/2018    CBG (last 3)  Recent Labs    12/09/18 2124 12/10/18 0803 12/10/18 0836  GLUCAP 388* 44* 128*      Condition - Extremely Guarded  Family Communication  : Son 12/07/2018 updated in detail, he knows his mom wants to be DNR, again on 12/09/18.  Code Status : DNR per patient's wishes.  Son Laverna Peace aware of her wishes.  Diet :   Diet Order            Diet Carb Modified Fluid consistency: Thin; Room service appropriate? Yes  Diet effective now               Disposition Plan  : Home likely on 12/11/2018 per patient's wishes.   Home health PT ordered.  Consults  :  None  Procedures  :     PUD Prophylaxis :  PPI  DVT Prophylaxis  :  Lovenox started  Lab Results  Component Value Date   PLT 298 12/10/2018    Inpatient Medications  Scheduled Meds: . amLODipine  10 mg Oral Daily  . aspirin EC  81 mg Oral QODAY  . citalopram  20 mg Oral Daily  . enoxaparin (LOVENOX) injection  40 mg Subcutaneous QHS  . fenofibrate  54 mg Oral Daily  . glimepiride  2 mg Oral BID WC  . hydrALAZINE  50 mg Oral Q8H  . insulin aspart  0-5 Units Subcutaneous QHS  . insulin aspart  0-9 Units Subcutaneous TID WC  . insulin aspart  4 Units Subcutaneous TID WC  . insulin glargine  40 Units Subcutaneous Daily  . methylPREDNISolone (SOLU-MEDROL) injection  30 mg Intravenous Daily  . metoprolol tartrate  25 mg Oral BID  . pantoprazole  40 mg Oral Daily  . prednisoLONE acetate  2 drop Both Eyes TID  . rosuvastatin  5 mg Oral Daily  . vitamin B-12  500 mcg Oral Daily  . vitamin C  500 mg Oral Daily  . zinc sulfate  220 mg Oral Daily   Continuous Infusions: . sodium chloride Stopped (12/08/18 0744)  . remdesivir 100 mg in NS 250 mL Stopped (12/09/18 2330)   PRN Meds:.acetaminophen, albuterol,  ALPRAZolam, dextrose, hydrALAZINE, Melatonin, [DISCONTINUED] ondansetron **OR** ondansetron (ZOFRAN) IV  Antibiotics  :    Anti-infectives (From admission, onward)   Start     Dose/Rate Route Frequency Ordered Stop   12/07/18 2200  remdesivir 100 mg in sodium chloride 0.9 % 250 mL IVPB     100 mg 500 mL/hr over 30 Minutes Intravenous Every 24 hours 12/06/18 2009 12/11/18 2159   12/06/18 2200  remdesivir 200 mg in sodium chloride 0.9 % 250 mL IVPB     200 mg 500 mL/hr over 30 Minutes Intravenous Once 12/06/18 2009 12/06/18 2336       Time Spent in minutes  30   Lala Lund M.D on 12/10/2018 at 10:25 AM  To page go to [email or web address] - password [email or web address]  [facility] -  Office  [phone]  See all Orders from today for  further details    Objective:   Vitals:   12/09/18 1714 12/09/18 1945 12/10/18 0400 12/10/18 0807  BP: 103/88 (!) 153/72 (!) 148/75 (!) 163/63  Pulse: 80   64  Resp: (!) 30   (!) 25  Temp: 98.4 F (36.9 C) 98.5 F (36.9 C) (!) 95.2 F (35.1 C) 97.9 F (36.6 C)  TempSrc: Oral Oral Oral Oral  SpO2: 93% 93%  90%  Weight:      Height:        Wt Readings from Last 3 Encounters:  12/06/18 68.5 kg  12/06/18 68.5 kg  05/13/18 70.4 kg     Intake/Output Summary (Last 24 hours) at 12/10/2018 1025 Last data filed at 12/10/2018 0916 Gross per 24 hour  Intake 960 ml  Output -  Net 960 ml     Physical Exam  Awake Alert,   No new F.N deficits, Normal affect [place].AT,PERRAL Supple Neck,No JVD, No cervical lymphadenopathy appriciated.  Symmetrical Chest wall movement, Good air movement bilaterally, CTAB RRR,No Gallops, Rubs or new Murmurs, No Parasternal Heave +ve B.Sounds, Abd Soft, No tenderness, No organomegaly appriciated, No rebound - guarding or rigidity. No Cyanosis, Clubbing or edema, No new Rash or bruise     Data Review:    CBC Recent Labs  Lab 12/06/18 1045 12/07/18 0158 12/08/18 0420 12/09/18 0306 12/10/18 0315  WBC 4.2 4.4 4.2 6.9 5.4  HGB 12.2 11.4* 12.3 11.6* 11.2*  HCT 35.9* 35.0* 37.2 35.5* 33.7*  PLT 166 175 251 272 298  MCV 90.2 92.8 92.1 92.9 92.3  MCH 30.7 30.2 30.4 30.4 30.7  MCHC 34.0 32.6 33.1 32.7 33.2  RDW 12.9 13.0 13.1 13.2 13.0  LYMPHSABS 1.1 1.3 0.8 1.2 1.5  MONOABS 0.3 0.4 0.2 0.6 0.5  EOSABS 0.0 0.0 0.0 0.0 0.0  BASOSABS 0.0 0.0 0.0 0.0 0.0    Chemistries  Recent Labs  Lab 12/06/18 1045 12/06/18 1100 12/07/18 0158 12/08/18 0420 12/09/18 0306 12/10/18 0315  NA 131*  --  134* 138 136 137  K 3.8  --  4.0 4.3 3.9 3.8  CL 95*  --  98 99 100 101  CO2 24  --  '25 27 26 27  ' GLUCOSE 218*  --  343* 146* 142* 160*  BUN 20  --  22 31* 36* 34*  CREATININE 1.02*  --  1.08* 1.01* 0.85 0.79  CALCIUM 8.2*  --  8.4* 8.9 8.7* 8.8*  MG   --   --   --  1.8 1.9 1.8  AST  --  37 38 47* 42* 33  ALT  --  25 26 32 34 34  ALKPHOS  --  23* 26* 27* 27* 30*  BILITOT  --  0.6 0.1* 0.5 0.3 0.5   ------------------------------------------------------------------------------------------------------------------ No results for input(s): CHOL, HDL, LDLCALC, TRIG, CHOLHDL, LDLDIRECT in the last 72 hours.  Lab Results  Component Value Date   HGBA1C 9.0 (H) 12/07/2018   ------------------------------------------------------------------------------------------------------------------ Recent Labs    12/08/18 0420  TSH 0.580    Cardiac Enzymes No results for input(s): CKMB, TROPONINI, MYOGLOBIN in the last 168 hours.  Invalid input(s): CK ------------------------------------------------------------------------------------------------------------------    Component Value Date/Time   BNP 131.2 (H) 12/10/2018 0315    Micro Results No results found for this or any previous visit (from the past 240 hour(s)).  Radiology Reports Dg Chest Portable 1 View  Result Date: 12/06/2018 CLINICAL DATA:  Fever and shortness of breath. EXAM: PORTABLE CHEST 1 VIEW COMPARISON:  05/13/2018 FINDINGS: New interstitial and airspace densities in the right upper lung. There is also concern for acute on chronic densities in the left upper lung. Heart size is within normal limits and stable. Atherosclerotic calcifications at the aortic arch. Trachea is midline. Linear density at the right lung base is suggestive for atelectasis. IMPRESSION: New densities in both lungs.  Findings are concerning for pneumonia. Electronically Signed   By: Markus Daft M.D.   On: 12/06/2018 11:28

## 2018-12-10 NOTE — Progress Notes (Signed)
cbg 44, given orange juice, peanut butter crackers and low fat milk.  Pt awake, alert and oriented, denies any s/s except mild sweating.  Will recheck after breakfast is eaten.

## 2018-12-10 NOTE — Progress Notes (Signed)
Occupational Therapy Treatment Patient Details Name: Samantha Freeman MRN: [id number] DOB: 03/09/1936 Today's Date: 12/10/2018    History of present illness 83 y.o. female with medical history significant of rt TKR, rt RTC repeair, ckd, htn, dm dx with covid 8/10 presents at [facility] ED with worsening sob.  No n/v/ but some diarrhea. Bil pna   OT comments  Pt progressing towards OT goals this session. Pt incontinent of BM in bed, requesting imodium (RN got orders). Pt was able to perform bed mobility at mod I level, SPT to [facility] with min guard, max A for peri care in standing after BM (Pt able to stand for prolonged period while OT performed rear peri care) Pt then able to take steps to recliner, and performed seated grooming with set up. VSS throughout session. Pt declining further mobility until she can get imodium. Talked to RN and Pt OK to SPT to [facility] in room unsupervised. Pt verbalized understanding of that's all the mobility she can do unsupervised. OT will continue to follow acutely. Plan to establish HEP next session.    Follow Up Recommendations  Home health OT;Supervision - Intermittent    Equipment Recommendations  None recommended by OT    Recommendations for Other Services PT consult    Precautions / Restrictions Precautions Precautions: Fall       Mobility Bed Mobility Overal bed mobility: Modified Independent             General bed mobility comments: increased time required  Transfers Overall transfer level: Needs assistance Equipment used: None Transfers: Sit to/from Stand;Stand Pivot Transfers Sit to Stand: Supervision Stand pivot transfers: Min guard       General transfer comment: Min Guard A for safety    Balance Overall balance assessment: Needs assistance Sitting-balance support: No upper extremity supported;Feet supported Sitting balance-Leahy Scale: Good     Standing balance support: No upper extremity supported Standing balance-Leahy Scale:  Fair                             ADL either performed or assessed with clinical judgement   ADL Overall ADL's : Needs assistance/impaired     Grooming: Set up;Sitting;Wash/dry hands;Wash/dry face;Oral care Grooming Details (indicate cue type and reason): in recliner             Lower Body Dressing: Min guard;Sit to/from stand Lower Body Dressing Details (indicate cue type and reason): Pt donning socks using figure four method. Min Guard A for safety in standing Toilet Transfer: Psychologist, sport and exercise Details (indicate cue type and reason): Min Guard A for safety Toileting- Clothing Manipulation and Hygiene: Maximal assistance;Sit to/from stand Toileting - Clothing Manipulation Details (indicate cue type and reason): Pt able to stand min guard, one hand supported on arm rest of BSC, and maintain standing for prolongued cleaning by OT     Functional mobility during ADLs: Min guard General ADL Comments: Pt demonstrating decreased strength and activity tolerance compared to baseline function. Very motivated to participate in therapy.      Vision       Perception     Praxis      Cognition Arousal/Alertness: Awake/alert Behavior During Therapy: WFL for tasks assessed/performed Overall Cognitive Status: Impaired/Different from baseline Area of Impairment: Problem solving                             Problem Solving: Slow processing;Requires  verbal cues General Comments: Pt requiring increased time for processing. verbal cues for problem solving        Exercises Exercises: Other exercises   Shoulder Instructions       General Comments SpO2 >92% throughout session on 2LO2    Pertinent Vitals/ Pain       Pain Assessment: No/denies pain  Home Living                                          Prior Functioning/Environment              Frequency  Min 3X/week        Progress Toward Goals  OT  Goals(current goals can now be found in the care plan section)  Progress towards OT goals: Progressing toward goals  Acute Rehab OT Goals Patient Stated Goal: return home without needing assistive device to walk OT Goal Formulation: With patient Time For Goal Achievement: 12/23/18 Potential to Achieve Goals: Good  Plan Discharge plan remains appropriate    Co-evaluation                 AM-PAC OT "6 Clicks" Daily Activity     Outcome Measure   Help from another person eating meals?: None Help from another person taking care of personal grooming?: None Help from another person toileting, which includes using toliet, bedpan, or urinal?: A Lot Help from another person bathing (including washing, rinsing, drying)?: A Little Help from another person to put on and taking off regular upper body clothing?: None Help from another person to put on and taking off regular lower body clothing?: A Little 6 Click Score: 20    End of Session Equipment Utilized During Treatment: Oxygen(2L)  OT Visit Diagnosis: Unsteadiness on feet (R26.81);Other abnormalities of gait and mobility (R26.89);Muscle weakness (generalized) (M62.81)   Activity Tolerance Patient tolerated treatment well   Patient Left in chair;with call bell/phone within reach   Nurse Communication Mobility status;Other (comment)(OK for SPT to [facility])        Time: 4462-8638 OT Time Calculation (min): 31 min  Charges: OT General Charges $OT Visit: 1 Visit OT Treatments $Self Care/Home Management : 23-37 mins Hulda Humphrey OTR/L Acute Rehabilitation Services Pager: [phone] Office: [place] 12/10/2018, 11:56 AM

## 2018-12-10 NOTE — Progress Notes (Signed)
Inpatient Diabetes Program Recommendations  AACE/ADA: New Consensus Statement on Inpatient Glycemic Control (2015)  Target Ranges:  Prepandial:   less than 140 mg/dL      Peak postprandial:   less than 180 mg/dL (1-2 hours)      Critically ill patients:  140 - 180 mg/dL   Lab Results  Component Value Date   GLUCAP 128 (H) 12/10/2018   HGBA1C 9.0 (H) 12/07/2018    Review of Glycemic Control  Inpatient Diabetes Program Recommendations:    Spoke with patient over the phone about DM control at home. Discussed Current A1c 9% this admission. Patient reports since shoulder surgery this past January she has had a hard time controlling her glucose levels. Also discussed steroid medication and current glucose trends.   Discussed the need for insulin while on steroids outpatient.   Patient reports recently getting established at the [facility] clinic with an Endocrinologist and has seen them twice. She mentioned last visit no changes were made to her regimen.  Discussed steps on how to use insulin pen. Asked RN Alyse Low to take Demo insulin pen in patient's room to show her the steps. Patient already has given herself an injection.  Will attach step by step instructions on how to use insulin pen as well. Patient lives by herself. Patient reports checking glucose once a day. Asked patient to check her glucose more often and keep close contact with her Endocrinologist for insulin titration.  Thanks,  Tama Headings RN, MSN, BC-ADM Inpatient Diabetes Coordinator Team Pager [phone] (8a-5p)

## 2018-12-11 ENCOUNTER — Encounter (HOSPITAL_COMMUNITY): Payer: Self-pay

## 2018-12-11 LAB — COMPREHENSIVE METABOLIC PANEL
ALT: 36 U/L (ref 0–44)
AST: 32 U/L (ref 15–41)
Albumin: 2.9 g/dL — ABNORMAL LOW (ref 3.5–5.0)
Alkaline Phosphatase: 35 U/L — ABNORMAL LOW (ref 38–126)
Anion gap: 10 (ref 5–15)
BUN: 30 mg/dL — ABNORMAL HIGH (ref 8–23)
CO2: 29 mmol/L (ref 22–32)
Calcium: 9.1 mg/dL (ref 8.9–10.3)
Chloride: 98 mmol/L (ref 98–111)
Creatinine, Ser: 0.81 mg/dL (ref 0.44–1.00)
GFR calc Af Amer: >60 mL/min (ref >60)
GFR calc non Af Amer: >60 mL/min (ref >60)
Glucose, Bld: 136 mg/dL — ABNORMAL HIGH (ref 70–99)
Potassium: 4 mmol/L (ref 3.5–5.1)
Sodium: 137 mmol/L (ref 135–145)
Total Bilirubin: 0.4 mg/dL (ref 0.3–1.2)
Total Protein: 6.4 g/dL — ABNORMAL LOW (ref 6.5–8.1)

## 2018-12-11 LAB — GLUCOSE, CAPILLARY
Glucose-Capillary: 169 mg/dL — ABNORMAL HIGH (ref 70–99)
Glucose-Capillary: 49 mg/dL — ABNORMAL LOW (ref 70–99)
Glucose-Capillary: 53 mg/dL — ABNORMAL LOW (ref 70–99)
Glucose-Capillary: 134 mg/dL — ABNORMAL HIGH (ref 70–99)
Glucose-Capillary: 166 mg/dL — ABNORMAL HIGH (ref 70–99)
Glucose-Capillary: 412 mg/dL — ABNORMAL HIGH (ref 70–99)
Glucose-Capillary: 456 mg/dL — ABNORMAL HIGH (ref 70–99)

## 2018-12-11 LAB — FERRITIN: Ferritin: 216 ng/mL (ref 11–307)

## 2018-12-11 LAB — CBC WITH DIFFERENTIAL/PLATELET
Abs Immature Granulocytes: 0.07 10*3/uL (ref 0.00–0.07)
Basophils Absolute: 0 10*3/uL (ref 0.0–0.1)
Basophils Relative: 0 %
Eosinophils Absolute: 0 10*3/uL (ref 0.0–0.5)
Eosinophils Relative: 0 %
HCT: 36.5 % (ref 36.0–46.0)
Hemoglobin: 12 g/dL (ref 12.0–15.0)
Immature Granulocytes: 1 %
Lymphocytes Relative: 29 %
Lymphs Abs: 1.7 10*3/uL (ref 0.7–4.0)
MCH: 30.4 pg (ref 26.0–34.0)
MCHC: 32.9 g/dL (ref 30.0–36.0)
MCV: 92.4 fL (ref 80.0–100.0)
Monocytes Absolute: 0.6 10*3/uL (ref 0.1–1.0)
Monocytes Relative: 9 %
Neutro Abs: 3.6 10*3/uL (ref 1.7–7.7)
Neutrophils Relative %: 61 %
Platelets: 347 10*3/uL (ref 150–400)
RBC: 3.95 MIL/uL (ref 3.87–5.11)
RDW: 12.9 % (ref 11.5–15.5)
WBC: 5.9 10*3/uL (ref 4.0–10.5)
nRBC: 0 % (ref 0.0–0.2)

## 2018-12-11 LAB — C-REACTIVE PROTEIN: CRP: 2.1 mg/dL — ABNORMAL HIGH (ref <1.0)

## 2018-12-11 LAB — LACTATE DEHYDROGENASE: LDH: 239 U/L — ABNORMAL HIGH (ref 98–192)

## 2018-12-11 LAB — D-DIMER, QUANTITATIVE: D-Dimer, Quant: 0.78 ug/mL-FEU — ABNORMAL HIGH (ref 0.00–0.50)

## 2018-12-11 LAB — MAGNESIUM: Magnesium: 1.6 mg/dL — ABNORMAL LOW (ref 1.7–2.4)

## 2018-12-11 LAB — BRAIN NATRIURETIC PEPTIDE: B Natriuretic Peptide: 83.4 pg/mL (ref 0.0–100.0)

## 2018-12-11 MED ORDER — BLOOD GLUCOSE MONITOR KIT: PACK | 1 refills | Status: AC

## 2018-12-11 MED ORDER — INSULIN GLARGINE 100 UNIT/ML ~~LOC~~ SOLN: 25.0000 [IU] | Freq: Every day | SUBCUTANEOUS | 0 refills | Status: DC

## 2018-12-11 MED ORDER — METHYLPREDNISOLONE 4 MG PO TBPK: ORAL_TABLET | ORAL | 0 refills | Status: DC

## 2018-12-11 MED ORDER — "INSULIN SYRINGE-NEEDLE U-100 25G X 1"" 1 ML MISC": 0 refills | Status: AC

## 2018-12-11 MED ORDER — INSULIN GLARGINE 100 UNIT/ML ~~LOC~~ SOLN
30.0000 [IU] | Freq: Every day | SUBCUTANEOUS | Status: DC
  Filled 2018-12-11: qty 0.3

## 2018-12-11 MED ORDER — FREESTYLE LITE TEST VI STRP: ORAL_STRIP | 0 refills | Status: AC

## 2018-12-11 MED ORDER — INSULIN ASPART 100 UNIT/ML ~~LOC~~ SOLN: SUBCUTANEOUS | 0 refills | Status: DC

## 2018-12-11 MED ORDER — INSULIN GLARGINE 100 UNIT/ML ~~LOC~~ SOLN
25.0000 [IU] | Freq: Every day | SUBCUTANEOUS | Status: DC
  Administered 2018-12-11: 09:00:00 25 [IU] via SUBCUTANEOUS
  Filled 2018-12-11: qty 0.25

## 2018-12-11 MED ORDER — INSULIN ASPART 100 UNIT/ML ~~LOC~~ SOLN
15.0000 [IU] | Freq: Once | SUBCUTANEOUS | Status: AC
  Administered 2018-12-11: 16:00:00 15 [IU] via SUBCUTANEOUS

## 2018-12-11 MED ORDER — PREDNISONE 20 MG PO TABS
30.0000 mg | ORAL_TABLET | Freq: Every day | ORAL | Status: DC
  Administered 2018-12-11: 30 mg via ORAL
  Filled 2018-12-11: qty 1

## 2018-12-11 MED ORDER — AMLODIPINE BESYLATE 10 MG PO TABS: 10.0000 mg | ORAL_TABLET | Freq: Every day | ORAL | 0 refills | Status: DC

## 2018-12-11 NOTE — Progress Notes (Signed)
Physical Therapy Treatment Patient Details Name: Samantha Freeman MRN: [id number] DOB: November 18, 1935 Today's Date: 12/11/2018    History of Present Illness 83 y.o. female with medical history significant of rt TKR, rt RTC repeair, ckd, htn, dm dx with covid 8/10 presents at [facility] ED with worsening sob.  No n/v/ but some diarrhea. Bil pna    PT Comments    Pt is progressing well with gait and mobility.  She is on RA today sating at lowest 88% and rebounding quickly to 91% with seated rest break after walking three times back and forth to the door in her room.  She reports her son (who has already had COVID) will be staying with her to care for her at discharge.  She remains appropriate for home therapy, but does not look like she will need home O2.  PT will continue to follow acutely for safe mobility progression  Follow Up Recommendations  Home health PT;Supervision - Intermittent     Equipment Recommendations  None recommended by PT    Recommendations for Other Services   NA     Precautions / Restrictions Precautions Precautions: Fall;Other (comment)    Mobility  Bed Mobility Overal bed mobility: Modified Independent             General bed mobility comments: increased time required  Transfers Overall transfer level: Needs assistance Equipment used: None Transfers: Sit to/from Stand Sit to Stand: Supervision         General transfer comment: close supervision for safety  Ambulation/Gait Ambulation/Gait assistance: Min guard Gait Distance (Feet): 60 Feet Assistive device: 1 person hand held assist Gait Pattern/deviations: Step-through pattern;Staggering left;Staggering right     General Gait Details: pt with mildly staggering gait pattern, improved with increased gait distance.  DOE 2/4 at the end of gait with O2 sat lowest observed 88% rebounding in less than 30 seconds to 91% with seated rest.  Measured with peds brown O2 sensor on her finger.            Balance Overall balance assessment: Needs assistance Sitting-balance support: No upper extremity supported;Feet supported Sitting balance-Leahy Scale: Good     Standing balance support: No upper extremity supported Standing balance-Leahy Scale: Fair Standing balance comment: close supervision in static standing                            Cognition Arousal/Alertness: Awake/alert Behavior During Therapy: WFL for tasks assessed/performed Overall Cognitive Status: Within Functional Limits for tasks assessed                                 General Comments: Not specifically teested, but processing and conversation speed normal      Exercises General Exercises - Upper Extremity Shoulder Flexion: AROM;Both;10 reps;Seated;Theraband Theraband Level (Shoulder Flexion): Level 1 (Yellow) Elbow Flexion: AROM;Both;10 reps;Theraband Theraband Level (Elbow Flexion): Level 1 (Yellow) Elbow Extension: AROM;Both;10 reps;Seated;Theraband Theraband Level (Elbow Extension): Level 1 (Yellow) General Exercises - Lower Extremity Long Arc Quad: AROM;Both;10 reps Hip ABduction/ADduction: AROM;Both;10 reps Straight Leg Raises: AROM;Both;10 reps Hip Flexion/Marching: AROM;Both;10 reps;Seated Other Exercises Other Exercises: HEP and yellow t band given and exercises reviewed with pt.         Pertinent Vitals/Pain Pain Assessment: No/denies pain           PT Goals (current goals can now be found in the care plan section) Acute Rehab PT Goals  Patient Stated Goal: return home without needing assistive device to walk Progress towards PT goals: Progressing toward goals    Frequency    Min 3X/week      PT Plan Current plan remains appropriate       AM-PAC PT "6 Clicks" Mobility   Outcome Measure  Help needed turning from your back to your side while in a flat bed without using bedrails?: A Little Help needed moving from lying on your back to sitting on the side  of a flat bed without using bedrails?: A Little Help needed moving to and from a bed to a chair (including a wheelchair)?: A Little Help needed standing up from a chair using your arms (e.g., wheelchair or bedside chair)?: A Little Help needed to walk in hospital room?: A Little Help needed climbing 3-5 steps with a railing? : A Little 6 Click Score: 18    End of Session   Activity Tolerance: Patient limited by fatigue Patient left: in chair;with call bell/phone within reach Nurse Communication: Mobility status PT Visit Diagnosis: Unsteadiness on feet (R26.81);Muscle weakness (generalized) (M62.81)     Time: 3570-1779 PT Time Calculation (min) (ACUTE ONLY): 38 min  Charges:  $Gait Training: 8-22 mins $Therapeutic Exercise: 23-37 mins                    Carolyn Sylvia B. Mychal Durio, PT, DPT  Acute Rehabilitation [phone] pager [phone] office  @ [place]: [phone]   12/11/2018, 11:58 AM

## 2018-12-11 NOTE — Discharge Summary (Signed)
Samantha Freeman IRW:[id number] DOB: 11-16-1935 DOA: 12/06/2018  PCP: Idelle Crouch, MD  Admit date: 12/06/2018  Discharge date: 12/11/2018  Admitted From: Home  Disposition:  Home   Recommendations for Outpatient Follow-up:   Follow up with PCP in 1-2 weeks  PCP Please obtain BMP/CBC, 2 view CXR in 1week,  (see Discharge instructions)   PCP Please follow up on the following pending results: Monitor CBGs closely, check CBC CMP and a two-view chest x-ray in 5 to 7 days.   Home Health: PT, RN, S work  Equipment/Devices: o2 PRN 2 lit upon ambulation if needed Consultations: None  Discharge Condition: Stable    CODE STATUS: Full    Diet Recommendation: Heart Healthy Low Carb   CC - SOB   Brief history of present illness from the day of admission and additional interim summary     Samantha Warrenis a 83 y.o.femalewith medical history significant ofckd, htn, dm dx with covid 8/10 presents at [facility] ED with worsening sob. No n/v/ but some diarrhea, no abd pain. Has had loss of taste and smell, her son who is works for an ambulance service had recently contracted COVID-66.  She developed some shortness of breath a day prior to admission and was then brought to the hospital where she was diagnosed with COVID-19 pneumonia and admitted.                                                                 Hospital Course    1. Acute Hypoxic Resp. Failure due to Acute Covid 19 Viral Pneumonitis during the ongoing 2020 Covid 19 Pandemic - she is been appropriately started on combination of IV steroids and Remdisvir.  She has clinically improved and now down to room air at rest from 4 L, may require 1 to 2 L oxygen upon ambulation hence will be provided portable oxygen upon discharge.  She has clinically improved and is  close to her baseline will be discharged home on home oxygen and a low-dose steroid taper with 1 week PCP follow-up.  2.  CKD 2.  Currently at baseline.  3.  Essential hypertension.    Her home dose Norvasc dose increased, continue home regimen except for HCTZ as she was dehydrated upon admission.  PCP to follow and monitor.  4.  Anxiety and depression.  Continue home medications.  5.  Dyslipidemia.  On statin and fenofibrate combination.  6.  GERD.  PPI.  7.  DM type II.  Poor outpatient control due to hyperglycemia.  A1c was 9, required insulin drip for the first 2 days, very sensitive to steroids as well, detailed diabetic and insulin education provided to the patient multiple times will be given Lantus and aggressive sliding scale upon discharge.  Her sugars need to be closely monitored by PCP  and home health nurse.  She may require adjustment over the next 2 weeks as steroids are being tapered.  Note in the long-term might be worthwhile to get her off of Amaryl completely and leave her on simple Lantus once a day and sliding scale with meals regimen.  I have stopped her Glucophage due to her age and high risk for lactic acidosis.   Discharge diagnosis     Principal Problem:   Acute respiratory disease due to COVID-19 virus Active Problems:   Pneumonia due to COVID-19 virus   CKD (chronic kidney disease)   DM (diabetes mellitus) (HCC)   HTN (hypertension)   PNA (pneumonia)    Discharge instructions    Discharge Instructions    Discharge instructions   Complete by: As directed    Follow with Primary MD Idelle Crouch, MD in 7 days   Get CBC, CMP, 2 view Chest X ray -  checked next visit within 1 week by Primary MD   Activity: As tolerated with Full fall precautions use walker/cane & assistance as needed  Disposition Home    Diet: Heart Healthy Low Carb  Accuchecks 4 times/day, Once in AM empty stomach and then before each meal. Log in all results and show them  to your Prim.MD in 3 days. If any glucose reading is under 80 or above 300 call your Prim MD immidiately. Follow Low glucose instructions for glucose under 80 as instructed.  Special Instructions: If you have smoked or chewed Tobacco  in the last 2 yrs please stop smoking, stop any regular Alcohol  and or any Recreational drug use.  On your next visit with your primary care physician please Get Medicines reviewed and adjusted.  Please request your Prim.MD to go over all Hospital Tests and Procedure/Radiological results at the follow up, please get all Hospital records sent to your Prim MD by signing hospital release before you go home.  If you experience worsening of your admission symptoms, develop shortness of breath, life threatening emergency, suicidal or homicidal thoughts you must seek medical attention immediately by calling 911 or calling your MD immediately  if symptoms less severe.  You Must read complete instructions/literature along with all the possible adverse reactions/side effects for all the Medicines you take and that have been prescribed to you. Take any new Medicines after you have completely understood and accpet all the possible adverse reactions/side effects.   Increase activity slowly   Complete by: As directed       Discharge Medications   Allergies as of 12/11/2018      Reactions   Propofol Anaphylaxis   Zolpidem Other (See Comments)   Hallucinations   Codeine Nausea Only   Niacin Rash      Medication List    STOP taking these medications   hydrochlorothiazide 12.5 MG tablet Commonly known as: HYDRODIURIL   metFORMIN 1000 MG tablet Commonly known as: GLUCOPHAGE     TAKE these medications   ALPRAZolam 0.5 MG tablet Commonly known as: XANAX Take 0.5 mg by mouth at bedtime as needed for sleep.   amLODipine 10 MG tablet Commonly known as: NORVASC Take 1 tablet (10 mg total) by mouth daily. What changed: how much to take   aspirin EC 81 MG  tablet Take 81 mg by mouth every other day.   bisoprolol-hydrochlorothiazide 5-6.25 MG tablet Commonly known as: ZIAC Take 0.5 tablets by mouth daily.   blood glucose meter kit and supplies Kit Dispense based on patient and insurance  preference. Use up to four times daily as directed. (FOR ICD-9 250.00, 250.01). For QAC - HS accuchecks.   CALCIUM 600/VITAMIN D3 PO Take 1 tablet by mouth daily.   cetirizine 10 MG chewable tablet Commonly known as: ZYRTEC Chew 10 mg by mouth daily.   cholecalciferol 25 MCG (1000 UT) tablet Commonly known as: VITAMIN D3 Take 1,000 Units by mouth daily.   citalopram 40 MG tablet Commonly known as: CELEXA Take 40 mg by mouth daily.   Desoximetasone 0.05 % Oint Apply 1 application topically daily as needed (for ear itching).   enalapril 20 MG tablet Commonly known as: VASOTEC Take 20 mg by mouth 2 (two) times daily.   fenofibrate 145 MG tablet Commonly known as: TRICOR Take 145 mg by mouth daily.   ferrous sulfate 325 (65 FE) MG tablet Take 325 mg by mouth daily with breakfast.   FREESTYLE LITE test strip Generic drug: glucose blood For glucose testing every before meals at bedtime. Diagnosis E 11.65  Can substitute to any accepted brand   glimepiride 2 MG tablet Commonly known as: AMARYL Take 2 mg by mouth 2 (two) times daily.   insulin aspart 100 UNIT/ML injection Commonly known as: NovoLOG Can switch to any approved brand if needed.  Before each meal 3 times a day , 140-199 - 2 units, 200-250 - 4 units, 251-299 - 8 units,  300-349 - 12 units,  350 or above 14 units.  Insulin PEN if approved, provide syringes and needles if needed.   insulin glargine 100 UNIT/ML injection Commonly known as: Lantus Inject 0.25 mLs (25 Units total) into the skin at bedtime. Dispense insulin pen if approved, if not dispense as needed syringes and needles for 1 month supply. Can switch to Levemir. Diagnosis E 11.65.   Insulin Syringe-Needle U-100 25G  X 1" 1 ML Misc For 4 times a day insulin SQ, 1 month supply. Diagnosis E11.65   magnesium oxide 400 MG tablet Commonly known as: MAG-OX Take 400 mg by mouth 3 (three) times daily.   Melatonin 5 MG Caps Take 2.5-5 mg by mouth at bedtime as needed (for sleep).   methylPREDNISolone 4 MG Tbpk tablet Commonly known as: MEDROL DOSEPAK follow package directions   multivitamin tablet Take 1 tablet by mouth daily. Women's Daily Multivitamin   omeprazole 20 MG capsule Commonly known as: PRILOSEC Take 20 mg by mouth daily.   oxyCODONE 5 MG immediate release tablet Commonly known as: Roxicodone Take 1-2 tablets (5-10 mg total) by mouth every 4 (four) hours as needed for moderate pain or severe pain.   prednisoLONE acetate 1 % ophthalmic suspension Commonly known as: PRED FORTE Apply 1 drop to eye at bedtime.   rosuvastatin 5 MG tablet Commonly known as: CRESTOR Take 5 mg by mouth daily.   Semaglutide 14 MG Tabs Take 1 tablet by mouth at bedtime. This is part of a clinical trial. The patient is either taking Semaglutide 57m tab daily OR a Placebo.   sitaGLIPtin 50 MG tablet Commonly known as: JANUVIA Take 50 mg by mouth daily.   vitamin B-12 500 MCG tablet Commonly known as: CYANOCOBALAMIN Take 500 mcg by mouth daily.            Durable Medical Equipment  (From admission, onward)         Start     Ordered   12/09/18 1020  For home use only DME oxygen  Once    Question Answer Comment  Length of Need 6 Months  Mode or (Route) Nasal cannula   Liters per Minute 2   Frequency Continuous (stationary and portable oxygen unit needed)   Oxygen conserving device Yes   Oxygen delivery system Gas      12/09/18 1019          Follow-up Information    Idelle Crouch, MD. Schedule an appointment as soon as possible for a visit in 1 week(s).   Specialty: Internal Medicine Contact information: [address] [place]  [phone]           Major procedures and Radiology Reports - PLEASE review detailed and final reports thoroughly  -         Dg Chest Portable 1 View  Result Date: 12/06/2018 CLINICAL DATA:  Fever and shortness of breath. EXAM: PORTABLE CHEST 1 VIEW COMPARISON:  05/13/2018 FINDINGS: New interstitial and airspace densities in the right upper lung. There is also concern for acute on chronic densities in the left upper lung. Heart size is within normal limits and stable. Atherosclerotic calcifications at the aortic arch. Trachea is midline. Linear density at the right lung base is suggestive for atelectasis. IMPRESSION: New densities in both lungs.  Findings are concerning for pneumonia. Electronically Signed   By: Markus Daft M.D.   On: 12/06/2018 11:28    Micro Results     No results found for this or any previous visit (from the past 240 hour(s)).  Today   Subjective    Samantha Freeman today has no headache,no chest abdominal pain,no new weakness tingling or numbness, feels much better wants to go home today.     Objective   Blood pressure (!) 159/64, pulse 73, temperature 98.3 F (36.8 C), temperature source Oral, resp. rate 20, height '5\' 4"'  (1.626 m), weight 68.5 kg, SpO2 95 %.   Intake/Output Summary (Last 24 hours) at 12/11/2018 0953 Last data filed at 12/11/2018 0720 Gross per 24 hour  Intake 1040 ml  Output 1550 ml  Net -510 ml    Exam  Awake Alert, Oriented x 3, No new F.N deficits, Normal affect [place].AT,PERRAL Supple Neck,No JVD, No cervical lymphadenopathy appriciated.  Symmetrical Chest wall movement, Good air movement bilaterally, CTAB RRR,No Gallops,Rubs or new Murmurs, No Parasternal Heave +ve B.Sounds, Abd Soft, Non tender, No organomegaly appriciated, No rebound -guarding or rigidity. No Cyanosis, Clubbing or edema, No new Rash or bruise   Data Review   CBC w Diff:  Lab Results  Component Value Date   WBC 5.9 12/11/2018   HGB 12.0  12/11/2018   HGB 11.9 (L) 10/07/2012   HCT 36.5 12/11/2018   HCT 35.1 10/07/2012   PLT 347 12/11/2018   PLT 281 10/07/2012   LYMPHOPCT 29 12/11/2018   LYMPHOPCT 39.6 10/07/2012   MONOPCT 9 12/11/2018   MONOPCT 9.0 10/07/2012   EOSPCT 0 12/11/2018   EOSPCT 3.4 10/07/2012   BASOPCT 0 12/11/2018   BASOPCT 0.7 10/07/2012    CMP:  Lab Results  Component Value Date   NA 137 12/11/2018   NA 140 02/15/2012   K 4.0 12/11/2018   K 3.7 02/15/2012   CL 98 12/11/2018   CL 105 02/15/2012   CO2 29 12/11/2018   CO2 28 02/15/2012   BUN 30 (H) 12/11/2018   BUN 14 02/15/2012   CREATININE 0.81 12/11/2018   CREATININE 0.87 02/15/2012   PROT 6.4 (L) 12/11/2018   ALBUMIN 2.9 (L) 12/11/2018   BILITOT 0.4 12/11/2018   ALKPHOS 35 (L)  12/11/2018   AST 32 12/11/2018   ALT 36 12/11/2018  . Lab Results  Component Value Date   HGBA1C 9.0 (H) 12/07/2018   . COVID-19 Labs  Recent Labs    12/09/18 0306 12/10/18 0315 12/11/18 0320  DDIMER 0.57* 0.53* 0.78*  FERRITIN 267 217 216  LDH 255* 211* 239*  CRP 4.3* 3.0* 2.1*    No results found for: SARSCOV2NAA   Total Time in preparing paper work, data evaluation and todays exam - 67 minutes  Lala Lund M.D on 12/11/2018 at 9:53 AM  [facility]   Office  [phone]

## 2018-12-11 NOTE — Progress Notes (Signed)
Occupational Therapy Treatment Patient Details Name: Samantha Freeman MRN: [id number] DOB: 10-20-1935 Today's Date: 12/11/2018    History of present illness 83 y.o. female with medical history significant of rt TKR, rt RTC repeair, ckd, htn, dm dx with covid 8/10 presents at [facility] [place] ED with worsening sob.  No n/v/ but some diarrhea. Bil pna   OT comments  Session focused on dressing and toilet transfer peri care. Pt was able to perform transfers at close supervision peri care at close supervision. Able to perform UB dressing with set up and LB dressing with min guard sit<>stand. 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 Current POC remains appropriate and Pt will require HHOT.    Follow Up Recommendations  Home health OT;Supervision - Intermittent    Equipment Recommendations  None recommended by OT    Recommendations for Other Services      Precautions / Restrictions Precautions Precautions: Fall;Other (comment)       Mobility Bed Mobility               General bed mobility comments: OOB in recliner at beginning and end of session  Transfers Overall transfer level: Needs assistance Equipment used: None Transfers: Sit to/from Stand Sit to Stand: Supervision         General transfer comment: close supervision for safety    Balance Overall balance assessment: Needs assistance Sitting-balance support: No upper extremity supported;Feet supported Sitting balance-Leahy Scale: Good     Standing balance support: No upper extremity supported Standing balance-Leahy Scale: Fair                             ADL either performed or assessed with clinical judgement   ADL Overall ADL's : Needs assistance/impaired     Grooming: Wash/dry hands;Set up;Sitting           Upper Body Dressing : Modified independent;Sitting   Lower Body Dressing: Min guard;Sit to/from  stand Lower Body Dressing Details (indicate cue type and reason): able to don/doff underwear and slip on shoes Toilet Transfer: Min guard;Stand-pivot   Toileting- Clothing Manipulation and Hygiene: Min guard;Sit to/from stand       Functional mobility during ADLs: Min guard       Vision       Perception     Praxis      Cognition Arousal/Alertness: Awake/alert Behavior During Therapy: WFL for tasks assessed/performed Overall Cognitive Status: Within Functional Limits for tasks assessed                                          Exercises     Shoulder Instructions       General Comments      Pertinent Vitals/ Pain       Pain Assessment: No/denies pain  Home Living                                          Prior Functioning/Environment              Frequency  Min 3X/week        Progress Toward Goals  OT Goals(current goals can now be found in the care plan section)  Progress towards  OT goals: Progressing toward goals  Acute Rehab OT Goals Patient Stated Goal: return home without needing assistive device to walk OT Goal Formulation: With patient Time For Goal Achievement: 12/23/18 Potential to Achieve Goals: Good  Plan Discharge plan remains appropriate    Co-evaluation                 AM-PAC OT "6 Clicks" Daily Activity     Outcome Measure   Help from another person eating meals?: None Help from another person taking care of personal grooming?: None Help from another person toileting, which includes using toliet, bedpan, or urinal?: A Little Help from another person bathing (including washing, rinsing, drying)?: A Little Help from another person to put on and taking off regular upper body clothing?: None Help from another person to put on and taking off regular lower body clothing?: A Little 6 Click Score: 21    End of Session    OT Visit Diagnosis: Unsteadiness on feet (R26.81);Other abnormalities  of gait and mobility (R26.89);Muscle weakness (generalized) (M62.81)   Activity Tolerance Patient tolerated treatment well   Patient Left in chair;with call bell/phone within reach   Nurse Communication Mobility status        Time: 5809-9833 OT Time Calculation (min): 13 min  Charges: OT General Charges $OT Visit: 1 Visit OT Treatments $Self Care/Home Management : 8-22 mins  Hulda Humphrey OTR/L Acute Rehabilitation Services Pager: [phone] Office: [place] 12/11/2018, 4:37 PM

## 2018-12-11 NOTE — Progress Notes (Addendum)
Patient's HS CBG was 412 at 2040. Dr. Shanon Brow notified and placed an order for 10 units of novolog. Spot check CBG at 0157 was 169.  Addendum 5208: CBG 49 this AM. Patient asymptomatic. Gave 8oz apple juice and peanut butter/crackers.

## 2018-12-11 NOTE — TOC Transition Note (Signed)
Transition of Care [facility]) - CM/SW Discharge Note   Patient Details  Name: Samantha Freeman MRN: [id number] Date of Birth: 1935-08-25  Transition of Care [facility]) CM/SW Contact:  Ninfa Meeker, RN Phone Number: [phone] (working remotely) 12/11/2018, 2:52 PM   Clinical Narrative:  83 yr old female admitted and treated for COVID 19. Thankfully patient is improving and will discharge home. Case manager spoke with patient via telephone to discuss choice for [place]. Referral was called to Adela Lank, [facility] Liaison. Patient says she lives alone, but her son stays over with her several days with her.     Final next level of care: [place] Barriers to Discharge: No Barriers Identified   Patient Goals and CMS Choice     Choice offered to / list presented to : Patient  Discharge Placement                       Discharge Plan and Services In-house Referral: NA Discharge Planning Services: CM Consult Post Acute Care Choice: Home Health          DME Arranged: N/A         HH Arranged: PT HH Agency: [place] Date [place]: 12/11/18 Time [place] Agency Contacted: [phone] Representative spoke with at [place]: Adela Lank  Social Determinants of Health (SDOH) Interventions     Readmission Risk Interventions No flowsheet data found.

## 2018-12-11 NOTE — Discharge Instructions (Signed)
Follow with Primary MD Idelle Crouch, MD in 7 days   Get CBC, CMP, 2 view Chest X ray -  checked next visit within 1 week by Primary MD   Activity: As tolerated with Full fall precautions use walker/cane & assistance as needed  Disposition Home    Diet: Heart Healthy Low Carb  Accuchecks 4 times/day, Once in AM empty stomach and then before each meal. Log in all results and show them to your Prim.MD in 3 days. If any glucose reading is under 80 or above 300 call your Prim MD immidiately. Follow Low glucose instructions for glucose under 80 as instructed.  Special Instructions: If you have smoked or chewed Tobacco  in the last 2 yrs please stop smoking, stop any regular Alcohol  and or any Recreational drug use.  On your next visit with your primary care physician please Get Medicines reviewed and adjusted.  Please request your Prim.MD to go over all Hospital Tests and Procedure/Radiological results at the follow up, please get all Hospital records sent to your Prim MD by signing hospital release before you go home.  If you experience worsening of your admission symptoms, develop shortness of breath, life threatening emergency, suicidal or homicidal thoughts you must seek medical attention immediately by calling 911 or calling your MD immediately  if symptoms less severe.  You Must read complete instructions/literature along with all the possible adverse reactions/side effects for all the Medicines you take and that have been prescribed to you. Take any new Medicines after you have completely understood and accpet all the possible adverse reactions/side effects.         Person Under Monitoring Name: Samantha Freeman  Location: [place] [address]   Infection Prevention Recommendations for Individuals Confirmed to have, or Being Evaluated for, 2019 Novel Coronavirus (COVID-19) Infection Who Receive Care at Home  Individuals who are confirmed to have, or  are being evaluated for, COVID-19 should follow the prevention steps below until a healthcare provider or local or state health department says they can return to normal activities.  Stay home except to get medical care You should restrict activities outside your home, except for getting medical care. Do not go to work, school, or public areas, and do not use public transportation or taxis.  Call ahead before visiting your doctor Before your medical appointment, call the healthcare provider and tell them that you have, or are being evaluated for, COVID-19 infection. This will help the healthcare providers office take steps to keep other people from getting infected. Ask your healthcare provider to call the local or state health department.  Monitor your symptoms Seek prompt medical attention if your illness is worsening (e.g., difficulty breathing). Before going to your medical appointment, call the healthcare provider and tell them that you have, or are being evaluated for, COVID-19 infection. Ask your healthcare provider to call the local or state health department.  Wear a facemask You should wear a facemask that covers your nose and mouth when you are in the same room with other people and when you visit a healthcare provider. People who live with or visit you should also wear a facemask while they are in the same room with you.  Separate yourself from other people in your home As much as possible, you should stay in a different room from other people in your home. Also, you should use a separate bathroom, if available.  Avoid sharing household items You should not  share dishes, drinking glasses, cups, eating utensils, towels, bedding, or other items with other people in your home. After using these items, you should wash them thoroughly with soap and water.  Cover your coughs and sneezes Cover your mouth and nose with a tissue when you cough or sneeze, or you can cough or sneeze  into your sleeve. Throw used tissues in a lined trash can, and immediately wash your hands with soap and water for at least 20 seconds or use an alcohol-based hand rub.  Wash your [facility] your hands often and thoroughly with soap and water for at least 20 seconds. You can use an alcohol-based hand sanitizer if soap and water are not available and if your hands are not visibly dirty. Avoid touching your eyes, nose, and mouth with unwashed hands.   Prevention Steps for Caregivers and Household Members of Individuals Confirmed to have, or Being Evaluated for, COVID-19 Infection Being Cared for in the Home  If you live with, or provide care at home for, a person confirmed to have, or being evaluated for, COVID-19 infection please follow these guidelines to prevent infection:  Follow healthcare providers instructions Make sure that you understand and can help the patient follow any healthcare provider instructions for all care.  Provide for the patients basic needs You should help the patient with basic needs in the home and provide support for getting groceries, prescriptions, and other personal needs.  Monitor the patients symptoms If they are getting sicker, call his or her medical provider and tell them that the patient has, or is being evaluated for, COVID-19 infection. This will help the healthcare providers office take steps to keep other people from getting infected. Ask the healthcare provider to call the local or state health department.  Limit the number of people who have contact with the patient  If possible, have only one caregiver for the patient.  Other household members should stay in another home or place of residence. If this is not possible, they should stay  in another room, or be separated from the patient as much as possible. Use a separate bathroom, if available.  Restrict visitors who do not have an essential need to be in the home.  Keep older adults,  very young children, and other sick people away from the patient Keep older adults, very young children, and those who have compromised immune systems or chronic health conditions away from the patient. This includes people with chronic heart, lung, or kidney conditions, diabetes, and cancer.  Ensure good ventilation Make sure that shared spaces in the home have good air flow, such as from an air conditioner or an opened window, weather permitting.  Wash your hands often  Wash your hands often and thoroughly with soap and water for at least 20 seconds. You can use an alcohol based hand sanitizer if soap and water are not available and if your hands are not visibly dirty.  Avoid touching your eyes, nose, and mouth with unwashed hands.  Use disposable paper towels to dry your hands. If not available, use dedicated cloth towels and replace them when they become wet.  Wear a facemask and gloves  Wear a disposable facemask at all times in the room and gloves when you touch or have contact with the patients blood, body fluids, and/or secretions or excretions, such as sweat, saliva, sputum, nasal mucus, vomit, urine, or feces.  Ensure the mask fits over your nose and mouth tightly, and do not touch it  during use.  Throw out disposable facemasks and gloves after using them. Do not reuse.  Wash your hands immediately after removing your facemask and gloves.  If your personal clothing becomes contaminated, carefully remove clothing and launder. Wash your hands after handling contaminated clothing.  Place all used disposable facemasks, gloves, and other waste in a lined container before disposing them with other household waste.  Remove gloves and wash your hands immediately after handling these items.  Do not share dishes, glasses, or other household items with the patient  Avoid sharing household items. You should not share dishes, drinking glasses, cups, eating utensils, towels, bedding, or  other items with a patient who is confirmed to have, or being evaluated for, COVID-19 infection.  After the person uses these items, you should wash them thoroughly with soap and water.  Wash laundry thoroughly  Immediately remove and wash clothes or bedding that have blood, body fluids, and/or secretions or excretions, such as sweat, saliva, sputum, nasal mucus, vomit, urine, or feces, on them.  Wear gloves when handling laundry from the patient.  Read and follow directions on labels of laundry or clothing items and detergent. In general, wash and dry with the warmest temperatures recommended on the label.  Clean all areas the individual has used often  Clean all touchable surfaces, such as counters, tabletops, doorknobs, bathroom fixtures, toilets, phones, keyboards, tablets, and bedside tables, every day. Also, clean any surfaces that may have blood, body fluids, and/or secretions or excretions on them.  Wear gloves when cleaning surfaces the patient has come in contact with.  Use a diluted bleach solution (e.g., dilute bleach with 1 part bleach and 10 parts water) or a household disinfectant with a label that says EPA-registered for coronaviruses. To make a bleach solution at home, add 1 tablespoon of bleach to 1 quart (4 cups) of water. For a larger supply, add  cup of bleach to 1 gallon (16 cups) of water.  Read labels of cleaning products and follow recommendations provided on product labels. Labels contain instructions for safe and effective use of the cleaning product including precautions you should take when applying the product, such as wearing gloves or eye protection and making sure you have good ventilation during use of the product.  Remove gloves and wash hands immediately after cleaning.  Monitor yourself for signs and symptoms of illness Caregivers and household members are considered close contacts, should monitor their health, and will be asked to limit movement  outside of the home to the extent possible. Follow the monitoring steps for close contacts listed on the symptom monitoring form.   ? If you have additional questions, contact your local health department or call the epidemiologist on call at [phone] (available 24/7). ? This guidance is subject to change. For the most up-to-date guidance from [facility], please refer to their website: [email or web address]

## 2018-12-30 ENCOUNTER — Other Ambulatory Visit: Payer: Self-pay

## 2019-03-13 DIAGNOSIS — G63 Polyneuropathy in diseases classified elsewhere: Secondary | ICD-10-CM | POA: Insufficient documentation

## 2019-05-06 DIAGNOSIS — D500 Iron deficiency anemia secondary to blood loss (chronic): Secondary | ICD-10-CM | POA: Insufficient documentation

## 2019-09-15 ENCOUNTER — Emergency Department: Payer: Medicare Other

## 2019-09-15 ENCOUNTER — Other Ambulatory Visit: Payer: Self-pay

## 2019-09-15 ENCOUNTER — Emergency Department
Admission: EM | Admit: 2019-09-15 | Discharge: 2019-09-15 | Disposition: A | Discharge status: Home / Self Care | Payer: Medicare Other | Attending: Emergency Medicine | Admitting: Emergency Medicine

## 2019-09-15 ENCOUNTER — Encounter: Payer: Self-pay | Admitting: Emergency Medicine

## 2019-09-15 DIAGNOSIS — Y9389 Activity, other specified: Secondary | ICD-10-CM | POA: Insufficient documentation

## 2019-09-15 DIAGNOSIS — Z7982 Long term (current) use of aspirin: Secondary | ICD-10-CM | POA: Insufficient documentation

## 2019-09-15 DIAGNOSIS — Z794 Long term (current) use of insulin: Secondary | ICD-10-CM | POA: Insufficient documentation

## 2019-09-15 DIAGNOSIS — Z79899 Other long term (current) drug therapy: Secondary | ICD-10-CM | POA: Insufficient documentation

## 2019-09-15 DIAGNOSIS — S20312A Abrasion of left front wall of thorax, initial encounter: Secondary | ICD-10-CM | POA: Insufficient documentation

## 2019-09-15 DIAGNOSIS — Y92410 Unspecified street and highway as the place of occurrence of the external cause: Secondary | ICD-10-CM | POA: Diagnosis not present

## 2019-09-15 DIAGNOSIS — N189 Chronic kidney disease, unspecified: Secondary | ICD-10-CM | POA: Insufficient documentation

## 2019-09-15 DIAGNOSIS — E119 Type 2 diabetes mellitus without complications: Secondary | ICD-10-CM | POA: Insufficient documentation

## 2019-09-15 DIAGNOSIS — S299XXA Unspecified injury of thorax, initial encounter: Secondary | ICD-10-CM | POA: Diagnosis present

## 2019-09-15 DIAGNOSIS — Y999 Unspecified external cause status: Secondary | ICD-10-CM | POA: Diagnosis not present

## 2019-09-15 DIAGNOSIS — I129 Hypertensive chronic kidney disease with stage 1 through stage 4 chronic kidney disease, or unspecified chronic kidney disease: Secondary | ICD-10-CM | POA: Insufficient documentation

## 2019-09-15 LAB — URINALYSIS, COMPLETE (UACMP) WITH MICROSCOPIC
Bacteria, UA: NONE SEEN
Bilirubin Urine: NEGATIVE
Glucose, UA: >=500 mg/dL — AB
Hgb urine dipstick: NEGATIVE
Ketones, ur: NEGATIVE mg/dL
Leukocytes,Ua: NEGATIVE
Nitrite: NEGATIVE
Protein, ur: 30 mg/dL — AB
Specific Gravity, Urine: 1.012 (ref 1.005–1.030)
Squamous Epithelial / HPF: NONE SEEN (ref 0–5)
pH: 7 (ref 5.0–8.0)

## 2019-09-15 LAB — CBC WITH DIFFERENTIAL/PLATELET
Abs Immature Granulocytes: 0.03 10*3/uL (ref 0.00–0.07)
Basophils Absolute: 0.1 10*3/uL (ref 0.0–0.1)
Basophils Relative: 1 %
Eosinophils Absolute: 0.3 10*3/uL (ref 0.0–0.5)
Eosinophils Relative: 4 %
HCT: 39.4 % (ref 36.0–46.0)
Hemoglobin: 13.5 g/dL (ref 12.0–15.0)
Immature Granulocytes: 0 %
Lymphocytes Relative: 36 %
Lymphs Abs: 2.7 10*3/uL (ref 0.7–4.0)
MCH: 30.6 pg (ref 26.0–34.0)
MCHC: 34.3 g/dL (ref 30.0–36.0)
MCV: 89.3 fL (ref 80.0–100.0)
Monocytes Absolute: 0.6 10*3/uL (ref 0.1–1.0)
Monocytes Relative: 9 %
Neutro Abs: 3.8 10*3/uL (ref 1.7–7.7)
Neutrophils Relative %: 50 %
Platelets: 251 10*3/uL (ref 150–400)
RBC: 4.41 MIL/uL (ref 3.87–5.11)
RDW: 12.2 % (ref 11.5–15.5)
WBC: 7.5 10*3/uL (ref 4.0–10.5)
nRBC: 0 % (ref 0.0–0.2)

## 2019-09-15 LAB — COMPREHENSIVE METABOLIC PANEL
ALT: 25 U/L (ref 0–44)
AST: 34 U/L (ref 15–41)
Albumin: 4.3 g/dL (ref 3.5–5.0)
Alkaline Phosphatase: 56 U/L (ref 38–126)
Anion gap: 11 (ref 5–15)
BUN: 28 mg/dL — ABNORMAL HIGH (ref 8–23)
CO2: 26 mmol/L (ref 22–32)
Calcium: 9.4 mg/dL (ref 8.9–10.3)
Chloride: 98 mmol/L (ref 98–111)
Creatinine, Ser: 1.09 mg/dL — ABNORMAL HIGH (ref 0.44–1.00)
GFR calc Af Amer: 55 mL/min — ABNORMAL LOW (ref >60)
GFR calc non Af Amer: 47 mL/min — ABNORMAL LOW (ref >60)
Glucose, Bld: 357 mg/dL — ABNORMAL HIGH (ref 70–99)
Potassium: 4 mmol/L (ref 3.5–5.1)
Sodium: 135 mmol/L (ref 135–145)
Total Bilirubin: 0.8 mg/dL (ref 0.3–1.2)
Total Protein: 7.5 g/dL (ref 6.5–8.1)

## 2019-09-15 LAB — GLUCOSE, CAPILLARY: Glucose-Capillary: 344 mg/dL — ABNORMAL HIGH (ref 70–99)

## 2019-09-15 LAB — PROTIME-INR
INR: 1 (ref 0.8–1.2)
Prothrombin Time: 12.7 seconds (ref 11.4–15.2)
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
## 2019-09-15 NOTE — Discharge Instructions (Addendum)
As we discussed please use your incentive spirometer 3 times per hour while awake for the next 5 to 7 days to help prevent pneumonia.  Please use Tylenol as needed for discomfort as written on the box.  Return to the emergency department for any worsening pain any shortness of breath, or any other symptom personally concerning to yourself.
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
## 2020-01-21 ENCOUNTER — Other Ambulatory Visit: Payer: Self-pay | Admitting: Orthopedic Surgery

## 2020-01-21 DIAGNOSIS — M25552 Pain in left hip: Secondary | ICD-10-CM

## 2020-01-21 DIAGNOSIS — M1612 Unilateral primary osteoarthritis, left hip: Secondary | ICD-10-CM

## 2020-02-08 ENCOUNTER — Ambulatory Visit
Admission: RE | Admit: 2020-02-08 | Discharge: 2020-02-08 | Disposition: A | Discharge status: Home / Self Care | Payer: Medicare Other | Source: Ambulatory Visit | Attending: Orthopedic Surgery | Admitting: Orthopedic Surgery

## 2020-02-08 ENCOUNTER — Other Ambulatory Visit: Payer: Self-pay

## 2020-02-08 DIAGNOSIS — M1612 Unilateral primary osteoarthritis, left hip: Secondary | ICD-10-CM | POA: Diagnosis not present

## 2020-02-08 DIAGNOSIS — M25552 Pain in left hip: Secondary | ICD-10-CM | POA: Diagnosis present

## 2020-03-29 ENCOUNTER — Other Ambulatory Visit: Payer: Self-pay | Admitting: Internal Medicine

## 2020-03-29 DIAGNOSIS — Z1231 Encounter for screening mammogram for malignant neoplasm of breast: Secondary | ICD-10-CM

## 2020-04-29 ENCOUNTER — Other Ambulatory Visit: Payer: Self-pay | Admitting: Sports Medicine

## 2020-04-29 DIAGNOSIS — M47816 Spondylosis without myelopathy or radiculopathy, lumbar region: Secondary | ICD-10-CM

## 2020-04-29 DIAGNOSIS — G8929 Other chronic pain: Secondary | ICD-10-CM

## 2020-04-29 DIAGNOSIS — M25551 Pain in right hip: Secondary | ICD-10-CM

## 2020-04-29 DIAGNOSIS — M5441 Lumbago with sciatica, right side: Secondary | ICD-10-CM

## 2020-05-10 ENCOUNTER — Other Ambulatory Visit: Payer: Self-pay

## 2020-05-10 ENCOUNTER — Ambulatory Visit
Admission: RE | Admit: 2020-05-10 | Discharge: 2020-05-10 | Disposition: A | Discharge status: Home / Self Care | Payer: Medicare Other | Source: Ambulatory Visit | Attending: Sports Medicine | Admitting: Sports Medicine

## 2020-05-10 DIAGNOSIS — M47816 Spondylosis without myelopathy or radiculopathy, lumbar region: Secondary | ICD-10-CM | POA: Diagnosis present

## 2020-05-10 DIAGNOSIS — M5441 Lumbago with sciatica, right side: Secondary | ICD-10-CM | POA: Diagnosis not present

## 2020-05-10 DIAGNOSIS — M25551 Pain in right hip: Secondary | ICD-10-CM

## 2020-05-10 DIAGNOSIS — G8929 Other chronic pain: Secondary | ICD-10-CM | POA: Diagnosis present

## 2020-05-23 DIAGNOSIS — I350 Nonrheumatic aortic (valve) stenosis: Secondary | ICD-10-CM | POA: Insufficient documentation

## 2020-05-23 DIAGNOSIS — I371 Nonrheumatic pulmonary valve insufficiency: Secondary | ICD-10-CM | POA: Insufficient documentation

## 2020-05-27 ENCOUNTER — Other Ambulatory Visit: Payer: Self-pay

## 2020-05-27 ENCOUNTER — Ambulatory Visit
Admission: RE | Admit: 2020-05-27 | Discharge: 2020-05-27 | Disposition: A | Discharge status: Home / Self Care | Payer: Medicare Other | Source: Ambulatory Visit | Attending: Internal Medicine | Admitting: Internal Medicine

## 2020-05-27 DIAGNOSIS — Z1231 Encounter for screening mammogram for malignant neoplasm of breast: Secondary | ICD-10-CM | POA: Insufficient documentation

## 2020-08-11 ENCOUNTER — Other Ambulatory Visit: Payer: Self-pay | Admitting: Student

## 2020-08-11 DIAGNOSIS — M7542 Impingement syndrome of left shoulder: Secondary | ICD-10-CM

## 2020-08-11 DIAGNOSIS — M25512 Pain in left shoulder: Secondary | ICD-10-CM

## 2020-08-11 DIAGNOSIS — M7582 Other shoulder lesions, left shoulder: Secondary | ICD-10-CM

## 2020-08-11 DIAGNOSIS — M19012 Primary osteoarthritis, left shoulder: Secondary | ICD-10-CM

## 2020-08-24 ENCOUNTER — Ambulatory Visit
Admission: RE | Admit: 2020-08-24 | Discharge: 2020-08-24 | Disposition: A | Discharge status: Home / Self Care | Payer: Medicare Other | Source: Ambulatory Visit | Attending: Student | Admitting: Student

## 2020-08-24 ENCOUNTER — Other Ambulatory Visit: Payer: Self-pay

## 2020-08-24 DIAGNOSIS — M25512 Pain in left shoulder: Secondary | ICD-10-CM | POA: Insufficient documentation

## 2020-08-24 DIAGNOSIS — M7582 Other shoulder lesions, left shoulder: Secondary | ICD-10-CM | POA: Insufficient documentation

## 2020-08-24 DIAGNOSIS — M19012 Primary osteoarthritis, left shoulder: Secondary | ICD-10-CM | POA: Insufficient documentation

## 2020-08-24 DIAGNOSIS — M7542 Impingement syndrome of left shoulder: Secondary | ICD-10-CM | POA: Diagnosis present

## 2020-09-23 ENCOUNTER — Other Ambulatory Visit: Payer: Self-pay | Admitting: Surgery

## 2020-10-05 ENCOUNTER — Other Ambulatory Visit: Payer: Self-pay

## 2020-10-05 ENCOUNTER — Other Ambulatory Visit
Admission: RE | Admit: 2020-10-05 | Discharge: 2020-10-05 | Disposition: A | Discharge status: Home / Self Care | Payer: Medicare Other | Source: Ambulatory Visit | Attending: Surgery | Admitting: Surgery

## 2020-10-05 DIAGNOSIS — Z01818 Encounter for other preprocedural examination: Secondary | ICD-10-CM | POA: Insufficient documentation

## 2020-10-05 DIAGNOSIS — I440 Atrioventricular block, first degree: Secondary | ICD-10-CM | POA: Diagnosis not present

## 2020-10-05 HISTORY — DX: Headache, unspecified: R51.9

## 2020-10-05 HISTORY — DX: Dyspnea, unspecified: R06.00

## 2020-10-05 HISTORY — DX: Pneumonia, unspecified organism: J18.9

## 2020-10-05 HISTORY — DX: Anemia, unspecified: D64.9

## 2020-10-05 HISTORY — DX: Unspecified osteoarthritis, unspecified site: M19.90

## 2020-10-05 HISTORY — DX: Cardiac murmur, unspecified: R01.1

## 2020-10-05 LAB — CBC WITH DIFFERENTIAL/PLATELET
Abs Immature Granulocytes: 0.02 10*3/uL (ref 0.00–0.07)
Basophils Absolute: 0.1 10*3/uL (ref 0.0–0.1)
Basophils Relative: 1 %
Eosinophils Absolute: 0.2 10*3/uL (ref 0.0–0.5)
Eosinophils Relative: 3 %
HCT: 41.1 % (ref 36.0–46.0)
Hemoglobin: 13.6 g/dL (ref 12.0–15.0)
Immature Granulocytes: 0 %
Lymphocytes Relative: 31 %
Lymphs Abs: 2.2 10*3/uL (ref 0.7–4.0)
MCH: 30.8 pg (ref 26.0–34.0)
MCHC: 33.1 g/dL (ref 30.0–36.0)
MCV: 93.2 fL (ref 80.0–100.0)
Monocytes Absolute: 0.6 10*3/uL (ref 0.1–1.0)
Monocytes Relative: 9 %
Neutro Abs: 4.2 10*3/uL (ref 1.7–7.7)
Neutrophils Relative %: 56 %
Platelets: 283 10*3/uL (ref 150–400)
RBC: 4.41 MIL/uL (ref 3.87–5.11)
RDW: 12.9 % (ref 11.5–15.5)
WBC: 7.2 10*3/uL (ref 4.0–10.5)
nRBC: 0 % (ref 0.0–0.2)

## 2020-10-05 LAB — COMPREHENSIVE METABOLIC PANEL
ALT: 31 U/L (ref 0–44)
AST: 41 U/L (ref 15–41)
Albumin: 4.2 g/dL (ref 3.5–5.0)
Alkaline Phosphatase: 50 U/L (ref 38–126)
Anion gap: 11 (ref 5–15)
BUN: 24 mg/dL — ABNORMAL HIGH (ref 8–23)
CO2: 30 mmol/L (ref 22–32)
Calcium: 9.6 mg/dL (ref 8.9–10.3)
Chloride: 97 mmol/L — ABNORMAL LOW (ref 98–111)
Creatinine, Ser: 1.15 mg/dL — ABNORMAL HIGH (ref 0.44–1.00)
GFR, Estimated: 47 mL/min — ABNORMAL LOW (ref >60)
Glucose, Bld: 169 mg/dL — ABNORMAL HIGH (ref 70–99)
Potassium: 3.8 mmol/L (ref 3.5–5.1)
Sodium: 138 mmol/L (ref 135–145)
Total Bilirubin: 0.6 mg/dL (ref 0.3–1.2)
Total Protein: 7.2 g/dL (ref 6.5–8.1)

## 2020-10-05 LAB — TYPE AND SCREEN
ABO/RH(D): A POS
Antibody Screen: NEGATIVE

## 2020-10-05 LAB — URINALYSIS, ROUTINE W REFLEX MICROSCOPIC
Bilirubin Urine: NEGATIVE
Glucose, UA: NEGATIVE mg/dL
Hgb urine dipstick: NEGATIVE
Ketones, ur: NEGATIVE mg/dL
Leukocytes,Ua: NEGATIVE
Nitrite: NEGATIVE
Protein, ur: NEGATIVE mg/dL
Specific Gravity, Urine: 1.005 (ref 1.005–1.030)
pH: 6 (ref 5.0–8.0)

## 2020-10-05 LAB — SURGICAL PCR SCREEN
MRSA, PCR: NEGATIVE
Staphylococcus aureus: NEGATIVE

## 2020-10-05 NOTE — Patient Instructions (Addendum)
Your procedure is scheduled on: 10/13/20 - Thursday Report to the Registration Desk on the 1st floor of the [facility]. To find out your arrival time, please call [phone] between 1PM - 3PM on: 10/12/20 - Wednesday - Report to [facility] on 10/11/20 for Covid Test between 8 am to 12 :00.  REMEMBER: Instructions that are not followed completely may result in serious medical risk, up to and including death; or upon the discretion of your surgeon and anesthesiologist your surgery may need to be rescheduled.  Do not eat food after midnight the night before surgery.  No gum chewing, lozengers or hard candies.  You may however, drink CLEAR liquids up to 2 hours before you are scheduled to arrive for your surgery. Do not drink anything within 2 hours of your scheduled arrival time. Type 1 and Type 2 diabetics should only drink water.  You may also drink one - 12oz G2 Gatorade - Drink 2 hours before your arrival time.  TAKE THESE MEDICATIONS THE MORNING OF SURGERY WITH A SIP OF WATER:  - amLODipine (NORVASC) 10 MG tablet - DULoxetine (CYMBALTA) 30 MG capsule - fenofibrate (TRICOR) 145 MG tablet - gabapentin (NEURONTIN) 100 MG capsule - magnesium gluconate (MAGONATE) 500 MG  - omeprazole (PRILOSEC) 20 MG capsule, take one the night before and one on the morning of surgery - helps to prevent nausea after surgery.  Take 1/2 of  insulin glargine dose the night before surgery and none on the morning of surgery.  Follow recommendations from Cardiologist, Pulmonologist or PCP regarding stopping Aspirin, Coumadin, Plavix, Eliquis, Pradaxa, or Pletal. Stop taking Aspirin 81 mg beginning 4 days prior to surgery and restart after surgery per MD order.  One week prior to surgery: Stop Anti-inflammatories (NSAIDS) such as Advil, Aleve, Ibuprofen, Motrin, Naproxen, Naprosyn and Aspirin based products such as Excedrin, Goodys Powder, BC Powder.  Stop ANY OVER THE COUNTER supplements until after  surgery.  You may however, continue to take Tylenol if needed for pain up until the day of surgery.  No Alcohol for 24 hours before or after surgery.  No Smoking including e-cigarettes for 24 hours prior to surgery.  No chewable tobacco products for at least 6 hours prior to surgery.  No nicotine patches on the day of surgery.  Do not use any "recreational" drugs for at least a week prior to your surgery.  Please be advised that the combination of cocaine and anesthesia may have negative outcomes, up to and including death. If you test positive for cocaine, your surgery will be cancelled.  On the morning of surgery brush your teeth with toothpaste and water, you may rinse your mouth with mouthwash if you wish. Do not swallow any toothpaste or mouthwash.  Do not wear jewelry, make-up, hairpins, clips or nail polish.  Do not wear lotions, powders, or perfumes.   Do not shave body from the neck down 48 hours prior to surgery just in case you cut yourself which could leave a site for infection.  Also, freshly shaved skin may become irritated if using the CHG soap.  Contact lenses, hearing aids and dentures may not be worn into surgery.  Do not bring valuables to the hospital. [facility] is not responsible for any missing/lost belongings or valuables.   Use CHG Soap or wipes as directed on instruction sheet.  Total Shoulder Arthroplasty:  use Benzolyl Peroxide 5% Gel as directed on instruction sheet.  Notify your doctor if there is any change in your  medical condition (cold, fever, infection).  Wear comfortable clothing (specific to your surgery type) to the hospital.  After surgery, you can help prevent lung complications by doing breathing exercises.  Take deep breaths and cough every 1-2 hours. Your doctor may order a device called an Incentive Spirometer to help you take deep breaths. When coughing or sneezing, hold a pillow firmly against your incision with both hands. This is  called "splinting." Doing this helps protect your incision. It also decreases belly discomfort.  If you are being admitted to the hospital overnight, leave your suitcase in the car. After surgery it may be brought to your room.  If you are being discharged the day of surgery, you will not be allowed to drive home. You will need a responsible adult (18 years or older) to drive you home and stay with you that night.   If you are taking public transportation, you will need to have a responsible adult (18 years or older) with you. Please confirm with your physician that it is acceptable to use public transportation.   Please call the [facility] Dept. at [phone] if you have any questions about these instructions.  Surgery Visitation Policy:  Patients undergoing a surgery or procedure may have one family member or support person with them as long as that person is not COVID-19 positive or experiencing its symptoms.  That person may remain in the waiting area during the procedure.  Inpatient Visitation:    Visiting hours are 7 a.m. to 8 p.m. Inpatients will be allowed two visitors daily. The visitors may change each day during the patient's stay. No visitors under the age of 47. Any visitor under the age of 42 must be accompanied by an adult. The visitor must pass COVID-19 screenings, use hand sanitizer when entering and exiting the patient's room and wear a mask at all times, including in the patient's room. Patients must also wear a mask when staff or their visitor are in the room. Masking is required regardless of vaccination status.

## 2020-10-10 ENCOUNTER — Encounter: Payer: Self-pay | Admitting: Surgery

## 2020-10-10 NOTE — Progress Notes (Signed)
  Perioperative Services: Pre-Admission/Anesthesia Testing  Abnormal Lab Notification    Date: 10/10/20  Name: Samantha Freeman MRN:   [id number]  Re: Abnormal labs noted during PAT appointment   Provider(s) Notified: Milagros Evener, MD and Cameron Proud, PA-C Notification mode: Routed and/or faxed via [facility]   ABNORMAL LAB VALUE(S): Lab Results  Component Value Date   GLUCOSE 169 (H) 10/05/2020    Notes: Patient with a T2DM diagnosis. She is currently on both oral (glimepiride, semaglutide) and parenteral (insulin lispro, insulin glargine, and insulin aspart) therapies. Last Hgb A1c was 9.9% on 08/03/2020. This communication is being sent in order to determine if patient is deemed to have adequate preoperative glycemic control in efforts to reduce her risk of developing SSI/PJI.   The odds ratio for SSI/PJI infection is between 2.8 and 3.4 for orthopedic surgery patients with pre-operative serum glucose levels of > 125 mg/dL or a post-operative levels of > 200 mg/dL ([place], 2019).    Data suggests that a Hgb A1c threshold of 7.7% tends to be more indicative of infection than the commonly used 7% and should perhaps be the pre-operative patient optimization goal Carolin Guernsey et al., 2017).   With that being said, the benefit of improving glycemic control must be weighed against the overall risk associated with delaying a necessary elective orthopedic surgery for this patient.   This is a Community education officer; no formal response is required.  Citations: Charlett Blake, A.F. Reducing the risk of infection after total joint arthroplasty: preoperative optimization. Arthroplasty 1, 4 (2019). [email or web address]  Lorrin Goodell MM, [place], Brigati D, [place] SM, [address] [place] JC, [place], [place], [place], Parvizi Lenna Sciara [place]. Determining the Threshold for HbA1c as a Predictor for Adverse Outcomes After Total Joint Arthroplasty: A  Multicenter, Retrospective Study. J Arthroplasty. 2017 Sep;32(9S):S263-S267.e1. [email or web address].   Honor Loh, MSN, APRN, FNP-C, CEN [facility]  Peri-operative Services Nurse Practitioner Phone: [phone] 10/10/20 9:11 AM

## 2020-10-10 NOTE — Progress Notes (Signed)
Perioperative Services  Pre-Admission/Anesthesia Testing Clinical Review  Date: 10/10/20  Patient Demographics:  Name: Samantha Freeman DOB:   03/10/1936 MRN:   [id number]  Planned Surgical Procedure(s):    Case: [id number] Date/Time: 10/13/20 0715   Procedure: REVERSE SHOULDER ARTHROPLASTY (Left: Shoulder)   Anesthesia type: Choice   Pre-op diagnosis:      Tendinitis of upper biceps tendon of left shoulder M75.22     Rotator cuff tendinitis, left M75.82     Nontraumatic complete tear of left rotator cuff M75.122   Location: [facility] OR ROOM 03 / [facility]   Surgeons: Poggi, John J, MD     NOTE: Available PAT nursing documentation and vital signs have been reviewed. Clinical nursing staff has updated patient's PMH/PSHx, current medication list, and drug allergies/intolerances to ensure comprehensive history available to assist in medical decision making as it pertains to the aforementioned surgical procedure and anticipated anesthetic course. Extensive review of available clinical information performed. [place] PMH and PSHx updated with any diagnoses/procedures that  may have been inadvertently omitted during her intake with the pre-admission testing department's nursing staff.  Clinical Discussion:  Samantha Freeman is a 85 y.o. female who is submitted for pre-surgical anesthesia review and clearance prior to her undergoing the above procedure. Patient has never been a smoker. Pertinent PMH includes: aortic stenosis, valvular insufficiency, cardiac murmur, HTN, HLD, T2DM, SOB, CKD-III, GERD (on daily PPI), anemia, OA, lumbar DDD, depression, anxiety (on BZO).   Patient is followed by cardiology (Kowalski, MD). She was last seen in the cardiology clinic on 09/21/2020; notes reviewed.  At the time of her clinic visit, patient doing well overall from a cardiovascular perspective.  She denied any chest pain, shortness breath, PND, orthopnea, palpitations, significant peripheral  edema, vertiginous symptoms, or presyncope/syncope.  Patient's main complaint was orthopedic pain (shoulder).  PMH significant for valvular heart disease.  TTE performed on 11/12/2019 revealed globally normal systolic function with an LVEF of >55%.  There were varying degrees of valvular insufficiency noted; trivial AR, mild MR and TR, and moderate PR.  There was mild aortic stenosis noted on exam; mean gradient was not indicated on study interpretation.  Myocardial perfusion imaging study performed on 08/08/2020 demonstrated an LVEF of 59% with no evidence of stress-induced myocardial ischemia or arrhythmia (see full interpretation of cardiovascular testing below).  Patient on GDMT for her HTN and HLD diagnoses.  Blood pressure well controlled at 120/90 on currently prescribed CCB and ACEi therapies.  Patient is on a fenofibrate therapy for her HLD. T2DM uncontrolled on currently prescribed regimen; Hgb A1c 9.9% when last checked on 08/03/2020. Functional capacity, as defined by DASI, is documented as being >/= 4 METS.  No changes were made to patient's medication regimen.  Patient to follow-up with outpatient cardiology in 9 months or sooner if needed.  Patient is scheduled for an elective reverse shoulder arthroplasty on 10/13/2020 with Dr. John Poggi.  Given patient's past medical history significant for cardiovascular diagnoses, presurgical cardiac clearance was sought by the performing surgeon's office and PAT team. "The patient is at the lowest risk possible for perioperative cardiovascular complications with the planned procedure.  The overall risk her procedure is low (<1%).  Currently has no evidence active and/or significant angina and/or congestive heart failure. Patient may proceed to surgery without restriction or need for further cardiovascular testing and an overall LOW risk". This patient is on daily antiplatelet therapy. She has been instructed on recommendations for holding her daily  low-dose   ASA for 4 days prior to her procedure with plans to restart as soon as postoperative bleeding risk felt to be minimized by her attending surgeon. The patient has been instructed that her last dose of her anticoagulant will be on 10/08/2020.  Patient reports previous perioperative complications with anesthesia in the past.  Patient has experienced (+) anaphylaxis with the use of propofol in the past. In review of the available records, it is noted that patient underwent a general anesthetic course here (ASA III) in 04/2018 without documented complications.   Vitals with BMI 10/05/2020 09/15/2019 09/15/2019  Height 5' 4" - 5' 4"  Weight 174 lbs 13 oz - 168 lbs  BMI 17.49 - 44.96  Systolic 759 163 846  Diastolic 81 74 61  Pulse 81 63 71    Providers/Specialists:   NOTE: Primary physician provider listed below. Patient may have been seen by APP or partner within same practice.   PROVIDER ROLE / SPECIALTY LAST OV  Poggi, Marshall Cork, MD  Orthopedics (Surgeon) 10/07/2020  Idelle Crouch, MD  Primary Care Provider 06/16/20226  Serafina Royals, MD  Cardiology 09/21/2020   Allergies:  Propofol, Zolpidem, Codeine, and Niacin  Current Home Medications:   No current facility-administered medications for this encounter.    ALPRAZolam (XANAX) 0.5 MG tablet   amLODipine (NORVASC) 10 MG tablet   aspirin EC 81 MG tablet   Calcium Carb-Cholecalciferol (CALCIUM 600/VITAMIN D3 PO)   cholecalciferol (VITAMIN D3) 25 MCG (1000 UT) tablet   DULoxetine (CYMBALTA) 30 MG capsule   enalapril (VASOTEC) 20 MG tablet   fenofibrate (TRICOR) 145 MG tablet   ferrous sulfate 325 (65 FE) MG tablet   gabapentin (NEURONTIN) 100 MG capsule   glimepiride (AMARYL) 4 MG tablet   insulin glargine (LANTUS) 100 UNIT/ML injection   insulin lispro (HUMALOG) 100 UNIT/ML injection   magnesium gluconate (MAGONATE) 500 MG tablet   melatonin 5 MG TABS   Multiple Vitamin (MULTIVITAMIN) tablet   omeprazole (PRILOSEC) 20  MG capsule   prednisoLONE acetate (PRED FORTE) 1 % ophthalmic suspension   Semaglutide 14 MG TABS   vitamin B-12 (CYANOCOBALAMIN) 1000 MCG tablet   blood glucose meter kit and supplies KIT   glucose blood (FREESTYLE LITE) test strip   insulin aspart (NOVOLOG) 100 UNIT/ML injection   Insulin Syringe-Needle U-100 25G X 1" 1 ML MISC   methylPREDNISolone (MEDROL DOSEPAK) 4 MG TBPK tablet   oxyCODONE (ROXICODONE) 5 MG immediate release tablet   History:   Past Medical History:  Diagnosis Date   Anemia    Anxiety    Arthritis    Chronic kidney disease    Complication of anesthesia    Propofol anaphylaxis   Depression    Diabetes mellitus without complication (HCC)    Dyspnea    GERD (gastroesophageal reflux disease)    Headache    Heart murmur    Hypertension    Pneumonia    Past Surgical History:  Procedure Laterality Date   ABDOMINAL HYSTERECTOMY     APPENDECTOMY     BICEPT TENODESIS Right 05/13/2018   Procedure: BICEPS TENODESIS;  Surgeon: Corky Mull, MD;  Location: [facility] ORS;  Service: Orthopedics;  Laterality: Right;   CARDIAC CATHETERIZATION     COLONOSCOPY     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   EYE SURGERY Right 09/2013   Corneal transplant   JOINT REPLACEMENT Right 2015   knee   SHOULDER ARTHROSCOPY WITH ROTATOR CUFF REPAIR Right 05/13/2018   Procedure:  SHOULDER ARTHROSCOPY WITH DEBRIDEMENT, DECOMPRESSION AND ROTATOR CUFF REPAIR;  Surgeon: Poggi, John J, MD;  Location: [facility] ORS;  Service: Orthopedics;  Laterality: Right;   TONSILLECTOMY     Family History  Problem Relation Age of Onset   Breast cancer Paternal Aunt    Social History   Tobacco Use   Smoking status: Never   Smokeless tobacco: Never  Vaping Use   Vaping Use: Never used  Substance Use Topics   Alcohol use: Never   Drug use: Never    Pertinent Clinical Results:  LABS: Labs reviewed: Acceptable for surgery.  No visits with results within 3 Day(s) from this visit.  Latest  known visit with results is:  Hospital Outpatient Visit on 10/05/2020  Component Date Value Ref Range Status   MRSA, PCR 10/05/2020 NEGATIVE  NEGATIVE Final   Staphylococcus aureus 10/05/2020 NEGATIVE  NEGATIVE Final   Comment: (NOTE) The Xpert SA Assay (FDA approved for NASAL specimens in patients 22 years of age and older), is one component of a comprehensive surveillance program. It is not intended to diagnose infection nor to guide or monitor treatment. Performed at [facility], [address] [place], [place] [address]    WBC 10/05/2020 7.2  4.0 - 10.5 K/uL Final   RBC 10/05/2020 4.41  3.87 - 5.11 MIL/uL Final   Hemoglobin 10/05/2020 13.6  12.0 - 15.0 g/dL Final   HCT 10/05/2020 41.1  36.0 - 46.0 % Final   MCV 10/05/2020 93.2  80.0 - 100.0 fL Final   MCH 10/05/2020 30.8  26.0 - 34.0 pg Final   MCHC 10/05/2020 33.1  30.0 - 36.0 g/dL Final   RDW 10/05/2020 12.9  11.5 - 15.5 % Final   Platelets 10/05/2020 283  150 - 400 K/uL Final   nRBC 10/05/2020 0.0  0.0 - 0.2 % Final   Neutrophils Relative % 10/05/2020 56  % Final   Neutro Abs 10/05/2020 4.2  1.7 - 7.7 K/uL Final   Lymphocytes Relative 10/05/2020 31  % Final   Lymphs Abs 10/05/2020 2.2  0.7 - 4.0 K/uL Final   Monocytes Relative 10/05/2020 9  % Final   Monocytes Absolute 10/05/2020 0.6  0.1 - 1.0 K/uL Final   Eosinophils Relative 10/05/2020 3  % Final   Eosinophils Absolute 10/05/2020 0.2  0.0 - 0.5 K/uL Final   Basophils Relative 10/05/2020 1  % Final   Basophils Absolute 10/05/2020 0.1  0.0 - 0.1 K/uL Final   Immature Granulocytes 10/05/2020 0  % Final   Abs Immature Granulocytes 10/05/2020 0.02  0.00 - 0.07 K/uL Final   Performed at [facility], [address] , [place] [address]   Sodium 10/05/2020 138  135 - 145 mmol/L Final   Potassium 10/05/2020 3.8  3.5 - 5.1 mmol/L Final   Chloride 10/05/2020 97 (A) 98 - 111 mmol/L Final   CO2 10/05/2020 30  22 - 32 mmol/L Final   Glucose, Bld  10/05/2020 169 (A) 70 - 99 mg/dL Final   Glucose reference range applies only to samples taken after fasting for at least 8 hours.   BUN 10/05/2020 24 (A) 8 - 23 mg/dL Final   Creatinine, Ser 10/05/2020 1.15 (A) 0.44 - 1.00 mg/dL Final   Calcium 10/05/2020 9.6  8.9 - 10.3 mg/dL Final   Total Protein 10/05/2020 7.2  6.5 - 8.1 g/dL Final   Albumin 10/05/2020 4.2  3.5 - 5.0 g/dL Final   AST 10/05/2020 41  15 - 41 U/L Final     ALT 10/05/2020 31  0 - 44 U/L Final   Alkaline Phosphatase 10/05/2020 50  38 - 126 U/L Final   Total Bilirubin 10/05/2020 0.6  0.3 - 1.2 mg/dL Final   GFR, Estimated 10/05/2020 47 (A) >60 mL/min Final   Comment: (NOTE) Calculated using the CKD-EPI Creatinine Equation (2021)    Anion gap 10/05/2020 11  5 - 15 Final   Performed at [facility], [address] [place], [place] [address]   Color, Urine 10/05/2020 STRAW (A) YELLOW Final   APPearance 10/05/2020 CLEAR (A) CLEAR Final   Specific Gravity, Urine 10/05/2020 1.005  1.005 - 1.030 Final   pH 10/05/2020 6.0  5.0 - 8.0 Final   Glucose, UA 10/05/2020 NEGATIVE  NEGATIVE mg/dL Final   Hgb urine dipstick 10/05/2020 NEGATIVE  NEGATIVE Final   Bilirubin Urine 10/05/2020 NEGATIVE  NEGATIVE Final   Ketones, ur 10/05/2020 NEGATIVE  NEGATIVE mg/dL Final   Protein, ur 10/05/2020 NEGATIVE  NEGATIVE mg/dL Final   Nitrite 10/05/2020 NEGATIVE  NEGATIVE Final   Leukocytes,Ua 10/05/2020 NEGATIVE  NEGATIVE Final   Performed at [facility], [address] [place], [place] [address]   ABO/RH(D) 10/05/2020 A POS   Final   Antibody Screen 10/05/2020 NEG   Final   Sample Expiration 10/05/2020 10/19/2020,2359   Final   Extend sample reason 10/05/2020    Final                   Value:NO TRANSFUSIONS OR PREGNANCY IN THE PAST 3 MONTHS Performed at [facility], [address] [place], [place] [address]     Ref Range & Units 2 mo ago  Hemoglobin A1C 4.2 - 5.6 % 9.9 High    Average Blood Glucose (Calc)  mg/dL 237   Resulting Agency  [facility]   Specimen Collected: 08/03/20 08:07 Last Resulted: 08/03/20 09:52  Received From: [facility]  Result Received: 08/11/20 13:46    ECG: Date: 10/05/2020 Time ECG obtained: 1318 PM Rate: 79 bpm Rhythm:  Sinus rhythm with first-degree AV block Axis (leads I and aVF): Left axis deviation Intervals: PR 226 ms. QRS 86 ms. QTc 456 ms. ST segment and T wave changes: No evidence of acute ST segment elevation or depression Comparison: Similar to previous tracing obtained on 09/15/2019   IMAGING / PROCEDURES: MRI SHOULDER LEFT WITHOUT CONTRAST performed on 08/24/2020 Severe supraspinatus tendinosis with full-thickness, near full width tear at the critical zone.  Muscle edema with early fatty infiltration, but no frank atrophy. Severe distal infraspinatus and moderate subscapularis tendinosis. Tendinosis and fraying of the biceps long head tendon within the bicipital groove.  LEXISCAN performed on 08/08/2020 LVEF 59% Regional wall motion reveals normal myocardial thickening and wall motion There were no artifacts noted Left ventricular cavity size normal No evidence of stress-induced myocardial ischemia or arrhythmia The overall quality of the study is good  TRANSTHORACIC ECHOCARDIOGRAM performed on 11/12/2019 LVEF >55% Normal left ventricular systolic function Normal right ventricular systolic function Trivial AR Mild MR and TR Moderate PR Mild calcific aortic stenosis; mean gradient not noted on study interpretation No evidence of pericardial effusion  Impression and Plan:  Leeah Feldmeier has been referred for pre-anesthesia review and clearance prior to her undergoing the planned anesthetic and procedural courses. Available labs, pertinent testing, and imaging results were personally reviewed by me. This patient has been appropriately cleared by cardiology with an overall LOW risk of significant  perioperative cardiovascular complications.  Based on   clinical review performed today (10/10/20), barring any significant acute changes in the patient's overall condition, it is anticipated that she will be able to proceed with the planned surgical intervention. Any acute changes in clinical condition may necessitate her procedure being postponed and/or cancelled. Patient will meet with anesthesia team (MD and/or CRNA) on the day of her procedure for preoperative evaluation/assessment. Questions regarding anesthetic course will be fielded at that time.   Pre-surgical instructions were reviewed with the patient during her PAT appointment and questions were fielded by PAT clinical staff. Patient was advised that if any questions or concerns arise prior to her procedure then she should return a call to PAT and/or her surgeon's office to discuss.  Bryan Gray, MSN, APRN, FNP-C, CEN [facility]  Peri-operative Services Nurse Practitioner Phone: [phone] Fax: [phone] 10/10/20 8:32 AM  NOTE: This note has been prepared using Dragon dictation software. Despite my best ability to proofread, there is always the potential that unintentional transcriptional errors may still occur from this process.  

## 2020-10-11 ENCOUNTER — Other Ambulatory Visit: Payer: Self-pay

## 2020-10-11 ENCOUNTER — Other Ambulatory Visit
Admission: RE | Admit: 2020-10-11 | Discharge: 2020-10-11 | Disposition: A | Discharge status: Home / Self Care | Payer: Medicare Other | Source: Ambulatory Visit | Attending: Surgery | Admitting: Surgery

## 2020-10-11 DIAGNOSIS — Z20822 Contact with and (suspected) exposure to covid-19: Secondary | ICD-10-CM | POA: Insufficient documentation

## 2020-10-11 DIAGNOSIS — Z01812 Encounter for preprocedural laboratory examination: Secondary | ICD-10-CM | POA: Insufficient documentation

## 2020-10-11 LAB — SARS CORONAVIRUS 2 (TAT 6-24 HRS): SARS Coronavirus 2: NEGATIVE

## 2020-10-12 MED ORDER — SODIUM CHLORIDE 0.9 % IV SOLN: INTRAVENOUS | Status: DC

## 2020-10-12 MED ORDER — CEFAZOLIN SODIUM-DEXTROSE 2-4 GM/100ML-% IV SOLN
2.0000 g | INTRAVENOUS | Status: AC
  Administered 2020-10-13: 2 g via INTRAVENOUS

## 2020-10-12 MED ORDER — ORAL CARE MOUTH RINSE: 15.0000 mL | Freq: Once | OROMUCOSAL | Status: DC

## 2020-10-12 MED ORDER — CHLORHEXIDINE GLUCONATE 0.12 % MT SOLN: 15.0000 mL | Freq: Once | OROMUCOSAL | Status: DC

## 2020-10-13 ENCOUNTER — Inpatient Hospital Stay: Payer: Medicare Other | Admitting: Urgent Care

## 2020-10-13 ENCOUNTER — Observation Stay: Payer: Medicare Other

## 2020-10-13 ENCOUNTER — Other Ambulatory Visit: Payer: Self-pay

## 2020-10-13 ENCOUNTER — Encounter: Payer: Self-pay | Admitting: Surgery

## 2020-10-13 ENCOUNTER — Inpatient Hospital Stay
Admission: RE | Admit: 2020-10-13 | Discharge: 2020-10-14 | DRG: 483 | Disposition: A | Discharge status: Home / Self Care | Payer: Medicare Other | Attending: Surgery | Admitting: Surgery

## 2020-10-13 ENCOUNTER — Encounter
Admission: RE | Disposition: A | Discharge status: Home / Self Care | Payer: Self-pay | Source: Home / Self Care | Attending: Surgery

## 2020-10-13 DIAGNOSIS — Z7982 Long term (current) use of aspirin: Secondary | ICD-10-CM

## 2020-10-13 DIAGNOSIS — Z8249 Family history of ischemic heart disease and other diseases of the circulatory system: Secondary | ICD-10-CM

## 2020-10-13 DIAGNOSIS — F32A Depression, unspecified: Secondary | ICD-10-CM | POA: Diagnosis present

## 2020-10-13 DIAGNOSIS — Z96651 Presence of right artificial knee joint: Secondary | ICD-10-CM | POA: Diagnosis present

## 2020-10-13 DIAGNOSIS — Z794 Long term (current) use of insulin: Secondary | ICD-10-CM

## 2020-10-13 DIAGNOSIS — Z8616 Personal history of COVID-19: Secondary | ICD-10-CM | POA: Diagnosis not present

## 2020-10-13 DIAGNOSIS — E1165 Type 2 diabetes mellitus with hyperglycemia: Secondary | ICD-10-CM | POA: Diagnosis present

## 2020-10-13 DIAGNOSIS — E1142 Type 2 diabetes mellitus with diabetic polyneuropathy: Secondary | ICD-10-CM | POA: Diagnosis present

## 2020-10-13 DIAGNOSIS — E7800 Pure hypercholesterolemia, unspecified: Secondary | ICD-10-CM | POA: Diagnosis present

## 2020-10-13 DIAGNOSIS — Z884 Allergy status to anesthetic agent status: Secondary | ICD-10-CM | POA: Diagnosis present

## 2020-10-13 DIAGNOSIS — K219 Gastro-esophageal reflux disease without esophagitis: Secondary | ICD-10-CM | POA: Diagnosis present

## 2020-10-13 DIAGNOSIS — Z79899 Other long term (current) drug therapy: Secondary | ICD-10-CM | POA: Diagnosis not present

## 2020-10-13 DIAGNOSIS — Z885 Allergy status to narcotic agent status: Secondary | ICD-10-CM | POA: Diagnosis not present

## 2020-10-13 DIAGNOSIS — Z888 Allergy status to other drugs, medicaments and biological substances status: Secondary | ICD-10-CM | POA: Diagnosis not present

## 2020-10-13 DIAGNOSIS — Z20822 Contact with and (suspected) exposure to covid-19: Secondary | ICD-10-CM | POA: Diagnosis present

## 2020-10-13 DIAGNOSIS — E1122 Type 2 diabetes mellitus with diabetic chronic kidney disease: Secondary | ICD-10-CM | POA: Diagnosis present

## 2020-10-13 DIAGNOSIS — N183 Chronic kidney disease, stage 3 unspecified: Secondary | ICD-10-CM | POA: Diagnosis present

## 2020-10-13 DIAGNOSIS — Z96612 Presence of left artificial shoulder joint: Secondary | ICD-10-CM

## 2020-10-13 DIAGNOSIS — F419 Anxiety disorder, unspecified: Secondary | ICD-10-CM | POA: Diagnosis present

## 2020-10-13 DIAGNOSIS — M75122 Complete rotator cuff tear or rupture of left shoulder, not specified as traumatic: Secondary | ICD-10-CM | POA: Diagnosis present

## 2020-10-13 DIAGNOSIS — I129 Hypertensive chronic kidney disease with stage 1 through stage 4 chronic kidney disease, or unspecified chronic kidney disease: Secondary | ICD-10-CM | POA: Diagnosis present

## 2020-10-13 DIAGNOSIS — M7522 Bicipital tendinitis, left shoulder: Secondary | ICD-10-CM | POA: Diagnosis present

## 2020-10-13 DIAGNOSIS — Z7984 Long term (current) use of oral hypoglycemic drugs: Secondary | ICD-10-CM

## 2020-10-13 HISTORY — DX: Endocarditis, valve unspecified: I38

## 2020-10-13 HISTORY — DX: Nonrheumatic aortic (valve) stenosis: I35.0

## 2020-10-13 HISTORY — DX: Other seasonal allergic rhinitis: J30.2

## 2020-10-13 HISTORY — DX: Esophageal obstruction: K22.2

## 2020-10-13 HISTORY — DX: Hyperlipidemia, unspecified: E78.5

## 2020-10-13 HISTORY — DX: Chronic kidney disease, stage 3 unspecified: N18.30

## 2020-10-13 HISTORY — DX: Unspecified cataract: H26.9

## 2020-10-13 HISTORY — DX: Other intervertebral disc degeneration, lumbar region without mention of lumbar back pain or lower extremity pain: M51.369

## 2020-10-13 HISTORY — PX: REVERSE SHOULDER ARTHROPLASTY: SHX5054

## 2020-10-13 HISTORY — DX: Type 2 diabetes mellitus without complications: E11.9

## 2020-10-13 HISTORY — DX: Other intervertebral disc degeneration, lumbar region: M51.36

## 2020-10-13 LAB — GLUCOSE, CAPILLARY
Glucose-Capillary: 279 mg/dL — ABNORMAL HIGH (ref 70–99)
Glucose-Capillary: 257 mg/dL — ABNORMAL HIGH (ref 70–99)
Glucose-Capillary: 220 mg/dL — ABNORMAL HIGH (ref 70–99)
Glucose-Capillary: 290 mg/dL — ABNORMAL HIGH (ref 70–99)

## 2020-10-13 LAB — POCT I-STAT, CHEM 8
BUN: 23 mg/dL (ref 8–23)
Calcium, Ion: 1.18 mmol/L (ref 1.15–1.40)
Chloride: 101 mmol/L (ref 98–111)
Creatinine, Ser: 1 mg/dL (ref 0.44–1.00)
Glucose, Bld: 276 mg/dL — ABNORMAL HIGH (ref 70–99)
HCT: 40 % (ref 36.0–46.0)
Hemoglobin: 13.6 g/dL (ref 12.0–15.0)
Potassium: 4 mmol/L (ref 3.5–5.1)
Sodium: 139 mmol/L (ref 135–145)
TCO2: 27 mmol/L (ref 22–32)

## 2020-10-13 SURGERY — ARTHROPLASTY, SHOULDER, TOTAL, REVERSE
Anesthesia: General | Site: Shoulder | Laterality: Left

## 2020-10-13 MED ORDER — SODIUM CHLORIDE 0.9 % IV SOLN
INTRAVENOUS | Status: DC | PRN
  Administered 2020-10-13: 30 mL

## 2020-10-13 MED ORDER — ESMOLOL HCL 100 MG/10ML IV SOLN
INTRAVENOUS | Status: DC | PRN
  Administered 2020-10-13 (×2): 30 mg via INTRAVENOUS

## 2020-10-13 MED ORDER — AMLODIPINE BESYLATE 5 MG PO TABS
5.0000 mg | ORAL_TABLET | Freq: Every day | ORAL | Status: DC
  Administered 2020-10-14: 5 mg via ORAL
  Filled 2020-10-13 (×2): qty 1

## 2020-10-13 MED ORDER — FENTANYL CITRATE (PF) 100 MCG/2ML IJ SOLN
INTRAMUSCULAR | Status: AC
  Administered 2020-10-13: 25 ug via INTRAVENOUS
  Filled 2020-10-13: qty 2

## 2020-10-13 MED ORDER — DIPHENHYDRAMINE HCL 12.5 MG/5ML PO ELIX: 12.5000 mg | ORAL_SOLUTION | ORAL | Status: DC | PRN

## 2020-10-13 MED ORDER — FENOFIBRATE 160 MG PO TABS
160.0000 mg | ORAL_TABLET | Freq: Every day | ORAL | Status: DC
  Administered 2020-10-14: 160 mg via ORAL
  Filled 2020-10-13 (×2): qty 1

## 2020-10-13 MED ORDER — PROPOFOL 10 MG/ML IV BOLUS
INTRAVENOUS | Status: AC
  Filled 2020-10-13: qty 40

## 2020-10-13 MED ORDER — PHENYLEPHRINE HCL (PRESSORS) 10 MG/ML IV SOLN
INTRAVENOUS | Status: AC
  Filled 2020-10-13: qty 1

## 2020-10-13 MED ORDER — MAGNESIUM GLUCONATE 500 MG PO TABS
500.0000 mg | ORAL_TABLET | Freq: Three times a day (TID) | ORAL | Status: DC
  Administered 2020-10-13 – 2020-10-14 (×3): 500 mg via ORAL
  Filled 2020-10-13 (×6): qty 1

## 2020-10-13 MED ORDER — TRANEXAMIC ACID 1000 MG/10ML IV SOLN
INTRAVENOUS | Status: AC
  Filled 2020-10-13: qty 10

## 2020-10-13 MED ORDER — CEFAZOLIN SODIUM-DEXTROSE 2-4 GM/100ML-% IV SOLN
INTRAVENOUS | Status: AC
  Filled 2020-10-13: qty 100

## 2020-10-13 MED ORDER — ETOMIDATE 2 MG/ML IV SOLN
INTRAVENOUS | Status: DC | PRN
  Administered 2020-10-13: 12 mg via INTRAVENOUS

## 2020-10-13 MED ORDER — SODIUM CHLORIDE 0.9 % IV SOLN: INTRAVENOUS | Status: DC

## 2020-10-13 MED ORDER — ACETAMINOPHEN 10 MG/ML IV SOLN
INTRAVENOUS | Status: AC
  Filled 2020-10-13: qty 100

## 2020-10-13 MED ORDER — PREDNISOLONE ACETATE 1 % OP SUSP
1.0000 [drp] | Freq: Every day | OPHTHALMIC | Status: DC
  Administered 2020-10-13: 1 [drp] via OPHTHALMIC
  Filled 2020-10-13: qty 1

## 2020-10-13 MED ORDER — OXYCODONE HCL 5 MG PO TABS
5.0000 mg | ORAL_TABLET | ORAL | Status: DC | PRN
  Administered 2020-10-13 – 2020-10-14 (×3): 10 mg via ORAL
  Filled 2020-10-13 (×3): qty 2

## 2020-10-13 MED ORDER — CALCIUM CARBONATE-VITAMIN D 500-200 MG-UNIT PO TABS
ORAL_TABLET | Freq: Every day | ORAL | Status: DC
  Administered 2020-10-13 – 2020-10-14 (×2): 1 via ORAL
  Filled 2020-10-13 (×2): qty 1

## 2020-10-13 MED ORDER — ADULT MULTIVITAMIN W/MINERALS CH
1.0000 | ORAL_TABLET | Freq: Every day | ORAL | Status: DC
  Administered 2020-10-13 – 2020-10-14 (×2): 1 via ORAL
  Filled 2020-10-13 (×2): qty 1

## 2020-10-13 MED ORDER — HYDROMORPHONE HCL 1 MG/ML IJ SOLN
INTRAMUSCULAR | Status: DC | PRN
  Administered 2020-10-13: .25 mg via INTRAVENOUS

## 2020-10-13 MED ORDER — ACETAMINOPHEN 325 MG PO TABS: 325.0000 mg | ORAL_TABLET | Freq: Four times a day (QID) | ORAL | Status: DC | PRN

## 2020-10-13 MED ORDER — PANTOPRAZOLE SODIUM 40 MG PO TBEC
40.0000 mg | DELAYED_RELEASE_TABLET | Freq: Every day | ORAL | Status: DC
  Administered 2020-10-14: 40 mg via ORAL
  Filled 2020-10-13: qty 1

## 2020-10-13 MED ORDER — BUPIVACAINE-EPINEPHRINE (PF) 0.5% -1:200000 IJ SOLN
INTRAMUSCULAR | Status: DC | PRN
  Administered 2020-10-13: 30 mL

## 2020-10-13 MED ORDER — TRANEXAMIC ACID 1000 MG/10ML IV SOLN
INTRAVENOUS | Status: DC | PRN
  Administered 2020-10-13: 1000 mg via INTRAVENOUS

## 2020-10-13 MED ORDER — LACTATED RINGERS IV SOLN: INTRAVENOUS | Status: DC | PRN

## 2020-10-13 MED ORDER — METOCLOPRAMIDE HCL 5 MG/ML IJ SOLN: 5.0000 mg | Freq: Three times a day (TID) | INTRAMUSCULAR | Status: DC | PRN

## 2020-10-13 MED ORDER — FERROUS SULFATE 325 (65 FE) MG PO TABS
325.0000 mg | ORAL_TABLET | Freq: Every day | ORAL | Status: DC
  Administered 2020-10-14: 325 mg via ORAL
  Filled 2020-10-13: qty 1

## 2020-10-13 MED ORDER — METOCLOPRAMIDE HCL 10 MG PO TABS: 5.0000 mg | ORAL_TABLET | Freq: Three times a day (TID) | ORAL | Status: DC | PRN

## 2020-10-13 MED ORDER — VITAMIN D 25 MCG (1000 UNIT) PO TABS
1000.0000 [IU] | ORAL_TABLET | Freq: Every day | ORAL | Status: DC
  Administered 2020-10-13 – 2020-10-14 (×2): 1000 [IU] via ORAL
  Filled 2020-10-13 (×2): qty 1

## 2020-10-13 MED ORDER — PHENYLEPHRINE HCL (PRESSORS) 10 MG/ML IV SOLN
INTRAVENOUS | Status: DC | PRN
  Administered 2020-10-13: 100 ug via INTRAVENOUS
  Administered 2020-10-13 (×2): 50 ug via INTRAVENOUS
  Administered 2020-10-13: 100 ug via INTRAVENOUS

## 2020-10-13 MED ORDER — DULOXETINE HCL 60 MG PO CPEP
60.0000 mg | ORAL_CAPSULE | Freq: Every day | ORAL | Status: DC
  Administered 2020-10-14: 60 mg via ORAL
  Filled 2020-10-13: qty 1

## 2020-10-13 MED ORDER — ALPRAZOLAM 0.5 MG PO TABS: 0.5000 mg | ORAL_TABLET | Freq: Every evening | ORAL | Status: DC | PRN

## 2020-10-13 MED ORDER — ONDANSETRON HCL 4 MG/2ML IJ SOLN
4.0000 mg | Freq: Four times a day (QID) | INTRAMUSCULAR | Status: DC | PRN
  Filled 2020-10-13: qty 2

## 2020-10-13 MED ORDER — INSULIN ASPART 100 UNIT/ML IJ SOLN
0.0000 [IU] | Freq: Three times a day (TID) | INTRAMUSCULAR | Status: DC
  Administered 2020-10-13: 5 [IU] via SUBCUTANEOUS
  Administered 2020-10-13: 8 [IU] via SUBCUTANEOUS
  Administered 2020-10-14: 11 [IU] via SUBCUTANEOUS
  Filled 2020-10-13 (×3): qty 1

## 2020-10-13 MED ORDER — ASPIRIN EC 81 MG PO TBEC
81.0000 mg | DELAYED_RELEASE_TABLET | ORAL | Status: DC
  Administered 2020-10-13: 81 mg via ORAL
  Filled 2020-10-13: qty 1

## 2020-10-13 MED ORDER — ONDANSETRON HCL 4 MG PO TABS: 4.0000 mg | ORAL_TABLET | Freq: Four times a day (QID) | ORAL | Status: DC | PRN

## 2020-10-13 MED ORDER — MELATONIN 5 MG PO TABS: 2.5000 mg | ORAL_TABLET | Freq: Every evening | ORAL | Status: DC | PRN

## 2020-10-13 MED ORDER — GABAPENTIN 100 MG PO CAPS
200.0000 mg | ORAL_CAPSULE | Freq: Three times a day (TID) | ORAL | Status: DC
  Administered 2020-10-13 – 2020-10-14 (×3): 200 mg via ORAL
  Filled 2020-10-13 (×3): qty 2

## 2020-10-13 MED ORDER — ACETAMINOPHEN 10 MG/ML IV SOLN
INTRAVENOUS | Status: DC | PRN
  Administered 2020-10-13: 1000 mg via INTRAVENOUS

## 2020-10-13 MED ORDER — MAGNESIUM HYDROXIDE 400 MG/5ML PO SUSP: 30.0000 mL | Freq: Every day | ORAL | Status: DC | PRN

## 2020-10-13 MED ORDER — INSULIN GLARGINE 100 UNIT/ML ~~LOC~~ SOLN
45.0000 [IU] | Freq: Every day | SUBCUTANEOUS | Status: DC
  Administered 2020-10-13: 45 [IU] via SUBCUTANEOUS
  Filled 2020-10-13 (×2): qty 0.45

## 2020-10-13 MED ORDER — ONDANSETRON HCL 4 MG/2ML IJ SOLN
INTRAMUSCULAR | Status: DC | PRN
  Administered 2020-10-13: 4 mg via INTRAVENOUS

## 2020-10-13 MED ORDER — CEFAZOLIN SODIUM-DEXTROSE 2-4 GM/100ML-% IV SOLN
2.0000 g | Freq: Four times a day (QID) | INTRAVENOUS | Status: AC
  Administered 2020-10-13 – 2020-10-14 (×3): 2 g via INTRAVENOUS
  Filled 2020-10-13 (×3): qty 100

## 2020-10-13 MED ORDER — BISACODYL 10 MG RE SUPP: 10.0000 mg | Freq: Every day | RECTAL | Status: DC | PRN

## 2020-10-13 MED ORDER — TRAMADOL HCL 50 MG PO TABS
50.0000 mg | ORAL_TABLET | Freq: Four times a day (QID) | ORAL | Status: DC | PRN
  Administered 2020-10-13 – 2020-10-14 (×3): 50 mg via ORAL
  Filled 2020-10-13 (×3): qty 1

## 2020-10-13 MED ORDER — ACETAMINOPHEN 500 MG PO TABS
1000.0000 mg | ORAL_TABLET | Freq: Four times a day (QID) | ORAL | Status: AC
  Administered 2020-10-13 – 2020-10-14 (×4): 1000 mg via ORAL
  Filled 2020-10-13 (×4): qty 2

## 2020-10-13 MED ORDER — FENTANYL CITRATE (PF) 100 MCG/2ML IJ SOLN
INTRAMUSCULAR | Status: AC
  Filled 2020-10-13: qty 2

## 2020-10-13 MED ORDER — GLIMEPIRIDE 4 MG PO TABS
4.0000 mg | ORAL_TABLET | Freq: Two times a day (BID) | ORAL | Status: DC
  Administered 2020-10-13 – 2020-10-14 (×3): 4 mg via ORAL
  Filled 2020-10-13 (×4): qty 1

## 2020-10-13 MED ORDER — ETOMIDATE 2 MG/ML IV SOLN
INTRAVENOUS | Status: AC
  Filled 2020-10-13: qty 10

## 2020-10-13 MED ORDER — VITAMIN B-12 1000 MCG PO TABS
1000.0000 ug | ORAL_TABLET | Freq: Every day | ORAL | Status: DC
  Administered 2020-10-13 – 2020-10-14 (×2): 1000 ug via ORAL
  Filled 2020-10-13 (×2): qty 1

## 2020-10-13 MED ORDER — ROCURONIUM BROMIDE 100 MG/10ML IV SOLN
INTRAVENOUS | Status: DC | PRN
  Administered 2020-10-13: 10 mg via INTRAVENOUS
  Administered 2020-10-13: 50 mg via INTRAVENOUS

## 2020-10-13 MED ORDER — SUGAMMADEX SODIUM 200 MG/2ML IV SOLN
INTRAVENOUS | Status: DC | PRN
  Administered 2020-10-13: 200 mg via INTRAVENOUS

## 2020-10-13 MED ORDER — HYDROMORPHONE HCL 1 MG/ML IJ SOLN: 0.2500 mg | INTRAMUSCULAR | Status: DC | PRN

## 2020-10-13 MED ORDER — HYDROMORPHONE HCL 1 MG/ML IJ SOLN
INTRAMUSCULAR | Status: AC
  Filled 2020-10-13: qty 1

## 2020-10-13 MED ORDER — SODIUM CHLORIDE 0.9 % IV SOLN
INTRAVENOUS | Status: DC | PRN
  Administered 2020-10-13: 20 ug/min via INTRAVENOUS

## 2020-10-13 MED ORDER — FENTANYL CITRATE (PF) 100 MCG/2ML IJ SOLN
INTRAMUSCULAR | Status: DC | PRN
  Administered 2020-10-13: 100 ug via INTRAVENOUS

## 2020-10-13 MED ORDER — SODIUM CHLORIDE 0.9 % IR SOLN
Status: DC | PRN
  Administered 2020-10-13: 3000 mL

## 2020-10-13 MED ORDER — CHLORHEXIDINE GLUCONATE 0.12 % MT SOLN
OROMUCOSAL | Status: AC
  Filled 2020-10-13: qty 15

## 2020-10-13 MED ORDER — DOCUSATE SODIUM 100 MG PO CAPS
100.0000 mg | ORAL_CAPSULE | Freq: Two times a day (BID) | ORAL | Status: DC
  Administered 2020-10-13 – 2020-10-14 (×2): 100 mg via ORAL
  Filled 2020-10-13 (×3): qty 1

## 2020-10-13 MED ORDER — SODIUM CHLORIDE FLUSH 0.9 % IV SOLN
INTRAVENOUS | Status: AC
  Filled 2020-10-13: qty 40

## 2020-10-13 MED ORDER — SEMAGLUTIDE 14 MG PO TABS: 14.0000 mg | ORAL_TABLET | Freq: Every day | ORAL | Status: DC

## 2020-10-13 MED ORDER — FENTANYL CITRATE (PF) 100 MCG/2ML IJ SOLN
25.0000 ug | INTRAMUSCULAR | Status: DC | PRN
  Administered 2020-10-13: 25 ug via INTRAVENOUS

## 2020-10-13 MED ORDER — FUROSEMIDE 10 MG/ML IJ SOLN
INTRAMUSCULAR | Status: AC
  Filled 2020-10-13: qty 8

## 2020-10-13 MED ORDER — ONDANSETRON HCL 4 MG/2ML IJ SOLN: 4.0000 mg | Freq: Once | INTRAMUSCULAR | Status: DC | PRN

## 2020-10-13 MED ORDER — LIDOCAINE HCL (CARDIAC) PF 100 MG/5ML IV SOSY
PREFILLED_SYRINGE | INTRAVENOUS | Status: DC | PRN
  Administered 2020-10-13: 60 mg via INTRAVENOUS

## 2020-10-13 MED ORDER — FLEET ENEMA 7-19 GM/118ML RE ENEM: 1.0000 | ENEMA | Freq: Once | RECTAL | Status: DC | PRN

## 2020-10-13 MED ORDER — BUPIVACAINE LIPOSOME 1.3 % IJ SUSP
INTRAMUSCULAR | Status: AC
  Filled 2020-10-13: qty 20

## 2020-10-13 MED ORDER — ENALAPRIL MALEATE 20 MG PO TABS
20.0000 mg | ORAL_TABLET | Freq: Two times a day (BID) | ORAL | Status: DC
  Administered 2020-10-13 – 2020-10-14 (×3): 20 mg via ORAL
  Filled 2020-10-13 (×4): qty 1

## 2020-10-13 MED ORDER — BUPIVACAINE-EPINEPHRINE (PF) 0.5% -1:200000 IJ SOLN
INTRAMUSCULAR | Status: AC
  Filled 2020-10-13: qty 30

## 2020-10-13 MED ORDER — ENOXAPARIN SODIUM 40 MG/0.4ML IJ SOSY
40.0000 mg | PREFILLED_SYRINGE | INTRAMUSCULAR | Status: DC
  Administered 2020-10-14: 40 mg via SUBCUTANEOUS
  Filled 2020-10-13: qty 0.4

## 2020-10-13 MED ORDER — 0.9 % SODIUM CHLORIDE (POUR BTL) OPTIME
TOPICAL | Status: DC | PRN
  Administered 2020-10-13: 500 mL

## 2020-10-13 SURGICAL SUPPLY — 73 items
BASEPLATE BOSS DRILL (MISCELLANEOUS) ×2 IMPLANT
BIT DRILL 2.5 (BIT) ×1
BIT DRILL 2.5X4.5XSCR (BIT) ×1 IMPLANT
BIT DRILL F/BASEPLATE CENTRAL (BIT) ×1 IMPLANT
BIT DRL 2.5X4.5XSCR (BIT) ×1
BLADE SAW SAG 25X90X1.19 (BLADE) ×2 IMPLANT
BNDG COHESIVE 4X5 TAN STRL (GAUZE/BANDAGES/DRESSINGS) ×2 IMPLANT
CHLORAPREP W/TINT 26 (MISCELLANEOUS) ×4 IMPLANT
COOLER POLAR GLACIER W/PUMP (MISCELLANEOUS) ×2 IMPLANT
COVER BACK TABLE REUSABLE LG (DRAPES) ×2 IMPLANT
COVER WAND RF STERILE (DRAPES) ×2 IMPLANT
DRAPE 3/4 80X56 (DRAPES) ×4 IMPLANT
DRAPE IMP U-DRAPE 54X76 (DRAPES) ×4 IMPLANT
DRAPE INCISE IOBAN 66X45 STRL (DRAPES) ×4 IMPLANT
DRILL BASEPLATE CENTRAL  S (BIT) ×1
DRILL BASEPLATE CENTRAL S (BIT) ×1
DRSG OPSITE POSTOP 4X8 (GAUZE/BANDAGES/DRESSINGS) ×2 IMPLANT
ELECT BLADE 6.5 EXT (BLADE) IMPLANT
ELECT CAUTERY BLADE 6.4 (BLADE) ×2 IMPLANT
ELECT REM PT RETURN 9FT ADLT (ELECTROSURGICAL) ×2
ELECTRODE REM PT RTRN 9FT ADLT (ELECTROSURGICAL) ×1 IMPLANT
GAUZE XEROFORM 1X8 LF (GAUZE/BANDAGES/DRESSINGS) ×2 IMPLANT
GLENOSPHERE RSS 2 CONCENTRIC (Shoulder) ×2 IMPLANT
GLOVE SRG 8 PF TXTR STRL LF DI (GLOVE) ×2 IMPLANT
GLOVE SURG ENC MOIS LTX SZ7.5 (GLOVE) ×8 IMPLANT
GLOVE SURG ENC MOIS LTX SZ8 (GLOVE) ×8 IMPLANT
GLOVE SURG UNDER LTX SZ8 (GLOVE) ×2 IMPLANT
GLOVE SURG UNDER POLY LF SZ8 (GLOVE) ×2
GOWN STRL REUS W/ TWL LRG LVL3 (GOWN DISPOSABLE) ×2 IMPLANT
GOWN STRL REUS W/ TWL XL LVL3 (GOWN DISPOSABLE) ×1 IMPLANT
GOWN STRL REUS W/TWL LRG LVL3 (GOWN DISPOSABLE) ×2
GOWN STRL REUS W/TWL XL LVL3 (GOWN DISPOSABLE) ×1
GUIDE PIN 2.0 X 150 (WIRE) ×2 IMPLANT
HOOD PEEL AWAY FLYTE STAYCOOL (MISCELLANEOUS) ×8 IMPLANT
ILLUMINATOR WAVEGUIDE N/F (MISCELLANEOUS) IMPLANT
IV NS IRRIG 3000ML ARTHROMATIC (IV SOLUTION) ×2 IMPLANT
KIT STABILIZATION SHOULDER (MISCELLANEOUS) ×2 IMPLANT
KIT TURNOVER KIT A (KITS) ×2 IMPLANT
LINER STD +3S RSS HXL (Liner) ×2 IMPLANT
MANIFOLD NEPTUNE II (INSTRUMENTS) ×2 IMPLANT
MASK FACE SPIDER DISP (MASK) ×2 IMPLANT
MAT ABSORB  FLUID 56X50 GRAY (MISCELLANEOUS) ×1
MAT ABSORB FLUID 56X50 GRAY (MISCELLANEOUS) ×1 IMPLANT
NDL SAFETY ECLIPSE 18X1.5 (NEEDLE) ×1 IMPLANT
NEEDLE HYPO 18GX1.5 SHARP (NEEDLE) ×1
NEEDLE HYPO 22GX1.5 SAFETY (NEEDLE) ×2 IMPLANT
NEEDLE SPNL 20GX3.5 QUINCKE YW (NEEDLE) ×2 IMPLANT
NS IRRIG 500ML POUR BTL (IV SOLUTION) ×2 IMPLANT
PACK ARTHROSCOPY SHOULDER (MISCELLANEOUS) ×2 IMPLANT
PAD ARMBOARD 7.5X6 YLW CONV (MISCELLANEOUS) ×2 IMPLANT
PAD WRAPON POLAR SHDR UNIV (MISCELLANEOUS) ×1 IMPLANT
PENCIL SMOKE EVACUATOR (MISCELLANEOUS) ×2 IMPLANT
PLATE BASE REVERSE RSS S (Plate) ×2 IMPLANT
PULSAVAC PLUS IRRIG FAN TIP (DISPOSABLE) ×2
SCREW 4.5X15 RSS W CAP (Screw) ×2 IMPLANT
SCREW 4.5X20 RSS W CAP (Screw) ×6 IMPLANT
SCREW 4.5X30 RSS W CAP (Screw) ×2 IMPLANT
SCREW BODY REVERSE LRG (Screw) ×2 IMPLANT
SLING ULTRA II M (MISCELLANEOUS) ×2 IMPLANT
SPONGE LAP 18X18 RF (DISPOSABLE) ×2 IMPLANT
STAPLER SKIN PROX 35W (STAPLE) ×2 IMPLANT
STEM PRESS FIT SZ 09 TSS (Stem) ×2 IMPLANT
SUT ETHIBOND 0 MO6 C/R (SUTURE) ×2 IMPLANT
SUT FIBERWIRE #2 38 BLUE 1/2 (SUTURE) ×8
SUT VIC AB 0 CT1 36 (SUTURE) ×2 IMPLANT
SUT VIC AB 2-0 CT1 27 (SUTURE) ×2
SUT VIC AB 2-0 CT1 TAPERPNT 27 (SUTURE) ×2 IMPLANT
SUTURE FIBERWR #2 38 BLUE 1/2 (SUTURE) ×4 IMPLANT
SYR 10ML LL (SYRINGE) ×4 IMPLANT
SYR 30ML LL (SYRINGE) ×4 IMPLANT
TIP FAN IRRIG PULSAVAC PLUS (DISPOSABLE) ×1 IMPLANT
TOWEL OR 17X26 4PK STRL BLUE (TOWEL DISPOSABLE) ×2 IMPLANT
WRAPON POLAR PAD SHDR UNIV (MISCELLANEOUS) ×2

## 2020-10-13 NOTE — Anesthesia Preprocedure Evaluation (Addendum)
Anesthesia Evaluation  Patient identified by MRN, date of birth, ID band Patient awake    Reviewed: Allergy & Precautions, H&P , NPO status , Patient's Chart, lab work & pertinent test results, reviewed documented beta blocker date and time   History of Anesthesia Complications (+) history of anesthetic complications  Airway Mallampati: II  TM Distance: >3 FB Neck ROM: full    Dental  (+) Teeth Intact, Dental Advidsory Given, Caps   Pulmonary shortness of breath, neg COPD, neg recent URI,  O2 sat at 93% on RA   Pulmonary exam normal        Cardiovascular Exercise Tolerance: Good hypertension, On Medications (-) angina(-) Past MI and (-) Cardiac Stents Normal cardiovascular exam(-) dysrhythmias + Valvular Problems/Murmurs  Rhythm:regular Rate:Normal     Neuro/Psych  Headaches, neg Seizures PSYCHIATRIC DISORDERS Anxiety Depression negative psych ROS   GI/Hepatic negative GI ROS, Neg liver ROS, GERD  Medicated,  Endo/Other  diabetes, Well Controlled, Type 2, Oral Hypoglycemic Agents  Renal/GU CRFRenal disease  negative genitourinary   Musculoskeletal   Abdominal   Peds  Hematology negative hematology ROS (+)   Anesthesia Other Findings Past Medical History: No date: Anxiety No date: Chronic kidney disease No date: Complication of anesthesia     Comment:  Propofol anaphylaxis No date: Depression No date: Diabetes mellitus without complication (HCC) No date: GERD (gastroesophageal reflux disease) No date: Hypertension Past Surgical History: No date: ABDOMINAL HYSTERECTOMY No date: APPENDECTOMY No date: CARDIAC CATHETERIZATION No date: COLONOSCOPY 11/2013: EYE SURGERY; Left     Comment:  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; Right     Comment:  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; Right     Comment:  knee No date: TONSILLECTOMY   Reproductive/Obstetrics negative OB ROS                              Anesthesia Physical  Anesthesia Plan  ASA: 3  Anesthesia Plan: General   Post-op Pain Management:  Regional for Post-op pain   Induction: Intravenous  PONV Risk Score and Plan: 4 or greater and Ondansetron, Dexamethasone and Treatment may vary due to age or medical condition  Airway Management Planned: Oral ETT  Additional Equipment:   Intra-op Plan:   Post-operative Plan: Extubation in OR  Informed Consent: I have reviewed the patients History and Physical, chart, labs and discussed the procedure including the risks, benefits and alternatives for the proposed anesthesia with the patient or authorized representative who has indicated his/her understanding and acceptance.     Dental Advisory Given  Plan Discussed with: CRNA  Anesthesia Plan Comments: (Pt describes problems attributed to propofol with previous egds.  Her hx is compatible with scope trauma yielding upper resp stridor and not consistent with propofol allergy yet we will proceed with amidate for induction.  JA)        Anesthesia Quick Evaluation

## 2020-10-13 NOTE — Progress Notes (Signed)
Pt admitted to room 152. Son at bedside. Pt oriented to equipment and call bell  10/13/20 1139  Vitals  Temp 97.7 F (36.5 C)  BP (!) 126/58  MAP (mmHg) 78  BP Location Right Arm  BP Method Automatic  Patient Position (if appropriate) Lying  Pulse Rate 74  Pulse Rate Source Monitor  Resp 15  MEWS COLOR  MEWS Score Color Green  Oxygen Therapy  SpO2 92 %  O2 Device Room Air  . VSS.

## 2020-10-13 NOTE — Evaluation (Signed)
Physical Therapy Evaluation Patient Details Name: Samantha Freeman MRN: [id number] DOB: [birth year] Today's Date: 10/13/2020   History of Present Illness  Pt is a 85 y.o. F s/p L TSA on 10/13/20. PMH includes aortic stenosis, valvular insufficiency, HLD, CKD, DM, HTN, GERD, anxiety.  Clinical Impression  Pt bedside with son present upon PT entry, notes 5-6/10 pain in L shoulder at rest. PLOF is independent at baseline including cooking and driving, pt's son and his wife are able to assist at home upon discharge. Supine <> sit requires min-assist, able to bridge and scoot with UE support. Ambulated with intermittent hand held assist for stability on 2L oxygen, increased to 3L due to SpO2 87%. Returned to 2L at rest with SpO2 readings greater than 90%. Skilled PT is recommended to maintain independence, and continued education on NWB status. Current discharge recommendation include home health PT with intermittent supervision to decrease risk of falls and improve independence with gait/function.     Follow Up Recommendations Home health PT    Equipment Recommendations  None recommended by PT    Recommendations for Other Services OT consult     Precautions / Restrictions Precautions Precautions: Shoulder Shoulder Interventions: Shoulder sling/immobilizer Required Braces or Orthoses: Sling Restrictions Weight Bearing Restrictions: Yes LUE Weight Bearing: Non weight bearing      Mobility  Bed Mobility Overal bed mobility: Needs Assistance Bed Mobility: Supine to Sit     Supine to sit: Min assist     General bed mobility comments: Utilizes LE, R UE with cues    Transfers Overall transfer level: Needs assistance Equipment used: None Transfers: Sit to/from Stand Sit to Stand: Min guard         General transfer comment: Hand held assist to steady ascending, good control descending reaching back w/ RUE  Ambulation/Gait Ambulation/Gait assistance: Min guard     Gait  Pattern/deviations: Step-to pattern     General Gait Details: Decreased cadence, hand- held assist intermittently  Stairs            Wheelchair Mobility    Modified Rankin (Stroke Patients Only)       Balance Overall balance assessment: Needs assistance Sitting-balance support: Feet supported;Single extremity supported Sitting balance-Leahy Scale: Fair     Standing balance support: During functional activity;Single extremity supported Standing balance-Leahy Scale: Poor Standing balance comment: Hand held assist to steady                             Pertinent Vitals/Pain Pain Assessment: 0-10 Pain Score: 6  Pain Location: L shoulder Pain Descriptors / Indicators: Aching;Sore Pain Intervention(s): Limited activity within patient's tolerance;Monitored during session;Premedicated before session;Repositioned;Ice applied    Home Living Family/patient expects to be discharged to:: Private residence Living Arrangements: Alone Available Help at Discharge: Family Type of Home: House Home Access: Level entry     Home Layout: One level        Prior Function Level of Independence: Independent         Comments: Drives, grocery shopping     Hand Dominance   Dominant Hand: Right    Extremity/Trunk Assessment   Upper Extremity Assessment Upper Extremity Assessment: RUE deficits/detail;LUE deficits/detail RUE Deficits / Details: elbow flex, elbow ext WNL functional mobiltiy LUE Deficits / Details: Wiggles fingers LUE Sensation:  (Sensation to fingers light touch, recognizes temperature cold from ice pack)    Lower Extremity Assessment Lower Extremity Assessment: Overall WFL for tasks assessed  Communication   Communication: No difficulties  Cognition Arousal/Alertness: Awake/alert Behavior During Therapy: WFL for tasks assessed/performed Overall Cognitive Status: Within Functional Limits for tasks assessed                                         General Comments      Exercises     Assessment/Plan    PT Assessment Patient needs continued PT services  PT Problem List Decreased strength;Decreased range of motion;Decreased activity tolerance;Decreased balance;Decreased mobility;Decreased coordination;Pain;Impaired sensation       PT Treatment Interventions Balance training;Gait training;Neuromuscular re-education;Stair training;Functional mobility training;Therapeutic activities;DME instruction    PT Goals (Current goals can be found in the Care Plan section)  Acute Rehab PT Goals Patient Stated Goal: Decrease pain, relax arm PT Goal Formulation: With patient Time For Goal Achievement: 10/27/20 Potential to Achieve Goals: Good    Frequency BID   Barriers to discharge        Co-evaluation               AM-PAC PT "6 Clicks" Mobility  Outcome Measure Help needed turning from your back to your side while in a flat bed without using bedrails?: A Little Help needed moving from lying on your back to sitting on the side of a flat bed without using bedrails?: A Little Help needed moving to and from a bed to a chair (including a wheelchair)?: A Lot Help needed standing up from a chair using your arms (e.g., wheelchair or bedside chair)?: A Lot Help needed to walk in hospital room?: A Little Help needed climbing 3-5 steps with a railing? : A Lot 6 Click Score: 15    End of Session Equipment Utilized During Treatment: Gait belt;Oxygen (2 L at rest) Activity Tolerance: Patient tolerated treatment well;Patient limited by pain Patient left: in bed;with call bell/phone within reach;with bed alarm set;with family/visitor present;with SCD's reapplied Nurse Communication: Mobility status (notified O2 destaturation w/ amb) PT Visit Diagnosis: Unsteadiness on feet (R26.81);Muscle weakness (generalized) (M62.81);Difficulty in walking, not elsewhere classified (R26.2);Pain Pain - Right/Left: Left Pain -  part of body: Shoulder    Time: 3329-5188 PT Time Calculation (min) (ACUTE ONLY): 39 min   Charges:             [employer], SPT

## 2020-10-13 NOTE — Op Note (Signed)
10/13/2020  10:14 AM  Patient:   Samantha Freeman  Pre-Op Diagnosis:   Large rotator cuff tear with early cuff arthropathy, left shoulder.  Post-Op Diagnosis:   Same.  Procedure:   Reverse left total shoulder arthroplasty.  Surgeon:   Pascal Lux, MD  Assistant:   Waylan Boga, RNFA  Anesthesia:   GET  Findings:   As above.  Complications:   None  EBL:   150 cc  Fluids:   700 cc crystalloid  UOP:   None  TT:   None  Drains:   None  Closure:   Staples  Implants:   All press-fit Integra system with a 9 mm stem, a large metaphyseal body, a +3 mm humeral platform, a mini baseplate, and a 38 mm concentric +2 mm laterally offset glenosphere.  Brief Clinical Note:   The patient is a 85 year old female with a long history of progressively worsening left shoulder pain. Her symptoms have progressed despite medications, activity modification, etc. Her history and examination are consistent with a rotator cuff tear. Her preoperative MRI scan confirmed the presence of a large rotator cuff tear, as well as early cuff arthropathy. The patient presents at this time for a reverse left total shoulder arthroplasty.  Procedure:   The patient was brought into the operating room and lain in the supine position. The patient then underwent general endotracheal intubation and anesthesia before the patient was repositioned in the beach chair position using the beach chair positioner. The left shoulder and upper extremity were prepped with ChloraPrep solution before being draped sterilely. Preoperative antibiotics were administered.   A timeout was performed to verify the appropriate surgical site before a standard anterior approach to the shoulder was made through an approximately 4-5 inch incision. The incision was carried down through the subcutaneous tissues to expose the deltopectoral fascia. The interval between the deltoid and pectoralis muscles was identified and this plane developed,  retracting the cephalic vein laterally with the deltoid muscle. The conjoined tendon was identified. Its lateral margin was dissected and the Kolbel self-retraining retractor inserted. The "three sisters" were identified and cauterized. Bursal tissues were removed to improve visualization. The subscapularis tendon was released from its attachment to the lesser tuberosity 1 cm proximal to its insertion and several tagging sutures placed. The inferior capsule was released with care after identifying and protecting the axillary nerve. The proximal humeral cut was made at approximately 20 of retroversion using the extra-medullary guide.   Attention was redirected to the glenoid. The labrum was debrided circumferentially before the center of the glenoid was identified. The guidewire was drilled into the glenoid neck using the appropriate guide. After verifying its position, it was overreamed with the mini-baseplate reamer to create a flat surface before the stem reamer was utilized. The superior and inferior peg sites were reamed using the appropriate guide to complete the glenoid preparation. The permanent mini-baseplate was impacted into place. It was stabilized with a 20 x 4.5 mm central screw and four peripheral screws. Locking caps were placed over the superior and inferior screws. The permanent 38 mm concentric glenosphere with +2 mm of lateral offset was then impacted into place and its Morse taper locking mechanism verified using manual distraction.  Attention was directed to the humeral side. The humeral canal was prepared utilizing the tapered stem reamers sequentially beginning with the 7 mm stem and progressing to a 9 mm stem. This demonstrated a good tight fit. The metaphyseal region was then prepared using the appropriate  planar device. The trial stem and standard metaphyseal body were put together on the back table and a trial reduction performed using the +0 mm and +6 mm inserts. With the +6 mm  insert, the shoulder was still slightly loose. Therefore, a repeat trial reduction was performed using the 9 mm trial stem with the large metaphyseal body. Repeat trial reductions using the +0 mm and +3 mm inserts were performed. With the +3 mm insert, the arm demonstrated excellent range of motion as the hand could be brought across the chest to the opposite shoulder and brought to the top of the patient's head and to the patient's ear. The shoulder appeared stable throughout this range of motion. The joint was dislocated and the trial components removed. The permanent 9 mm stem with the large body was impacted into place with care taken to maintain the appropriate version. A repeat trial reduction with the +3 mm insert again demonstrated excellent stability with the findings as described above. Therefore, the shoulder was re-dislocated and, after inserting the locking screw to secure the body to the stem, the permanent +3 mm insert impacted into place. After verifying its locking mechanism, the shoulder was relocated using two finger pressure and again placed through a range of motion with the findings as described above.  The wound was copiously irrigated with sterile saline solution using the jet lavage system before a total of 20 cc of Exparel diluted out to 60 cc with normal saline and 30 cc of 0.5% Sensorcaine with epinephrine was injected into the pericapsular and peri-incisional tissues to help with postoperative analgesia. The subscapularis tendon was reapproximated using #2 FiberWire interrupted sutures. The deltopectoral interval was closed using #0 Vicryl interrupted sutures before the subcutaneous tissues were closed using 2-0 Vicryl interrupted sutures. The skin was closed using staples. Prior to closing the skin, 1 g of transexemic acid in 10 cc of normal saline was injected intra-articularly to help with postoperative bleeding. A sterile occlusive dressing was applied to the wound before the arm  was placed into a shoulder immobilizer with an abduction pillow. A Polar Care system also was applied to the shoulder. The patient was then transferred back to a hospital bed before being awakened, extubated, and returned to the recovery room in satisfactory condition after tolerating the procedure well.

## 2020-10-13 NOTE — Anesthesia Procedure Notes (Addendum)
Procedure Name: Intubation Date/Time: 10/13/2020 7:44 AM Performed by: Louann Sjogren, CRNA Pre-anesthesia Checklist: Patient identified, Patient being monitored, Timeout performed, Emergency Drugs available and Suction available Patient Re-evaluated:Patient Re-evaluated prior to induction Oxygen Delivery Method: Circle system utilized Preoxygenation: Pre-oxygenation with 100% oxygen Induction Type: IV induction Ventilation: Mask ventilation without difficulty Laryngoscope Size: Mac, 3 and 4 Grade View: Grade I Tube type: Oral Tube size: 7.0 mm Number of attempts: 1 Airway Equipment and Method: Stylet Placement Confirmation: ETT inserted through vocal cords under direct vision, positive ETCO2 and breath sounds checked- equal and bilateral Secured at: 21 cm Tube secured with: Tape Dental Injury: Teeth and Oropharynx as per pre-operative assessment
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
## 2020-10-13 NOTE — Transfer of Care (Signed)
Immediate Anesthesia Transfer of Care Note  Patient: Samantha Freeman  Procedure(s) Performed: REVERSE SHOULDER ARTHROPLASTY (Left: Shoulder)  Patient Location: PACU  Anesthesia Type:General  Level of Consciousness: awake and alert   Airway & Oxygen Therapy: Patient Spontanous Breathing  Post-op Assessment: Report given to RN and Post -op Vital signs reviewed and stable  Post vital signs: Reviewed and stable  Last Vitals:  Vitals Value Taken Time  BP    Temp    Pulse    Resp    SpO2      Last Pain:  Vitals:   10/13/20 0626  TempSrc: Temporal  PainSc: 0-No pain         Complications: No notable events documented.

## 2020-10-13 NOTE — Anesthesia Postprocedure Evaluation (Signed)
Anesthesia Post Note  Patient: Samantha Freeman  Procedure(s) Performed: REVERSE SHOULDER ARTHROPLASTY (Left: Shoulder)  Patient location during evaluation: PACU Anesthesia Type: General Level of consciousness: awake and alert, awake and oriented Pain management: pain level controlled Vital Signs Assessment: post-procedure vital signs reviewed and stable Respiratory status: spontaneous breathing, nonlabored ventilation and respiratory function stable Cardiovascular status: blood pressure returned to baseline and stable Postop Assessment: no apparent nausea or vomiting Anesthetic complications: no   No notable events documented.   Last Vitals:  Vitals:   10/13/20 1122 10/13/20 1139  BP:  (!) 126/58  Pulse: 77 74  Resp: 13 15  Temp:  36.5 C  SpO2: 92% 92%    Last Pain:  Vitals:   10/13/20 1139  TempSrc:   PainSc: 3                  Phill Mutter

## 2020-10-14 ENCOUNTER — Encounter: Payer: Self-pay | Admitting: Surgery

## 2020-10-14 LAB — BASIC METABOLIC PANEL
Anion gap: 6 (ref 5–15)
BUN: 20 mg/dL (ref 8–23)
CO2: 30 mmol/L (ref 22–32)
Calcium: 9 mg/dL (ref 8.9–10.3)
Chloride: 96 mmol/L — ABNORMAL LOW (ref 98–111)
Creatinine, Ser: 0.93 mg/dL (ref 0.44–1.00)
GFR, Estimated: >60 mL/min (ref >60)
Glucose, Bld: 296 mg/dL — ABNORMAL HIGH (ref 70–99)
Potassium: 4.1 mmol/L (ref 3.5–5.1)
Sodium: 132 mmol/L — ABNORMAL LOW (ref 135–145)

## 2020-10-14 LAB — CBC
HCT: 38.6 % (ref 36.0–46.0)
Hemoglobin: 12.8 g/dL (ref 12.0–15.0)
MCH: 30.6 pg (ref 26.0–34.0)
MCHC: 33.2 g/dL (ref 30.0–36.0)
MCV: 92.3 fL (ref 80.0–100.0)
Platelets: 246 10*3/uL (ref 150–400)
RBC: 4.18 MIL/uL (ref 3.87–5.11)
RDW: 12.7 % (ref 11.5–15.5)
WBC: 10.4 10*3/uL (ref 4.0–10.5)
nRBC: 0 % (ref 0.0–0.2)

## 2020-10-14 LAB — SURGICAL PATHOLOGY

## 2020-10-14 LAB — GLUCOSE, CAPILLARY: Glucose-Capillary: 330 mg/dL — ABNORMAL HIGH (ref 70–99)

## 2020-10-14 MED ORDER — OXYCODONE HCL 5 MG PO TABS: 5.0000 mg | ORAL_TABLET | ORAL | 0 refills | Status: DC | PRN

## 2020-10-14 MED ORDER — ENOXAPARIN SODIUM 40 MG/0.4ML IJ SOSY: 40.0000 mg | PREFILLED_SYRINGE | INTRAMUSCULAR | 0 refills | Status: DC

## 2020-10-14 MED ORDER — TRAMADOL HCL 50 MG PO TABS: 50.0000 mg | ORAL_TABLET | Freq: Four times a day (QID) | ORAL | 0 refills | Status: DC | PRN

## 2020-10-14 NOTE — Progress Notes (Signed)
VSS, IV cath. Removed site clean,dry and intact. Went over d/c packet with pt she verbalized understanding of information. Pt transported with all,belongings via w/c down to private car.

## 2020-10-14 NOTE — Discharge Summary (Signed)
Physician Discharge Summary  Subjective: 1 Day Post-Op Procedure(s) (LRB): REVERSE SHOULDER ARTHROPLASTY (Left) Patient reports pain as moderate.   Patient seen in rounds with Dr. Roland Rack. Patient is well, and has had no acute complaints or problems Patient is ready to go home after physical therapy  Physician Discharge Summary  Patient ID: Samantha Freeman MRN: [id number] DOB/AGE: 83/01/1936 83 y.o.  Admit date: 10/13/2020 Discharge date: 10/14/2020  Admission Diagnoses:  Discharge Diagnoses:  Active Problems:   Status post reverse arthroplasty of shoulder, left   Discharged Condition: fair  Hospital Course: The patient is postop day 1 from a reverse left total shoulder replacement done by Dr. Roland Rack.  She is doing well with her pain management.  Her vitals have remained stable.  She ambulated to the bathroom last night.  She is ready to go home today after physical therapy.  Treatments: surgery:  Reverse left total shoulder arthroplasty.   Surgeon:   Pascal Lux, MD   Assistant:   Waylan Boga, RNFA   Anesthesia:   GET   Findings:   As above.   Complications:   None   EBL:   150 cc   Fluids:   700 cc crystalloid   UOP:   None   TT:   None   Drains:   None   Closure:   Staples   Implants:   All press-fit Integra system with a 9 mm stem, a large metaphyseal body, a +3 mm humeral platform, a mini baseplate, and a 38 mm concentric +2 mm laterally offset glenosphere.    Discharge Exam: Blood pressure (!) 155/71, pulse 90, temperature 98.7 F (37.1 C), temperature source Oral, resp. rate 18, height _0  (1.626 m), weight 79.3 kg, SpO2 91 %.   Disposition: Discharge disposition: 01-Home or Self Care        Allergies as of 10/14/2020       Reactions   Propofol Anaphylaxis   Zolpidem Other (See Comments)   Hallucinations   Codeine Nausea Only   Niacin Rash        Medication List     TAKE these medications    ALPRAZolam 0.5 MG  tablet Commonly known as: XANAX Take 0.5 mg by mouth at bedtime as needed for sleep.   amLODipine 10 MG tablet Commonly known as: NORVASC Take 1 tablet (10 mg total) by mouth daily. What changed: how much to take   aspirin EC 81 MG tablet Take 81 mg by mouth every other day.   blood glucose meter kit and supplies Kit Dispense based on patient and insurance preference. Use up to four times daily as directed. (FOR ICD-9 250.00, 250.01). For QAC - HS accuchecks.   CALCIUM 600/VITAMIN D3 PO Take 1 tablet by mouth daily.   cholecalciferol 25 MCG (1000 UNIT) tablet Commonly known as: VITAMIN D3 Take 1,000 Units by mouth daily.   DULoxetine 30 MG capsule Commonly known as: CYMBALTA Take 60 mg by mouth daily.   enalapril 20 MG tablet Commonly known as: VASOTEC Take 20 mg by mouth 2 (two) times daily.   enoxaparin 40 MG/0.4ML injection Commonly known as: LOVENOX Inject 0.4 mLs (40 mg total) into the skin daily for 14 days.   fenofibrate 145 MG tablet Commonly known as: TRICOR Take 145 mg by mouth daily.   ferrous sulfate 325 (65 FE) MG tablet Take 325 mg by mouth daily with breakfast.   FREESTYLE LITE test strip Generic drug: glucose blood For glucose testing every before meals at  bedtime. Diagnosis E 11.65  Can substitute to any accepted brand   gabapentin 100 MG capsule Commonly known as: NEURONTIN Take 200 mg by mouth 3 (three) times daily.   glimepiride 4 MG tablet Commonly known as: AMARYL Take 4 mg by mouth 2 (two) times daily.   insulin lispro 100 UNIT/ML injection Commonly known as: HUMALOG Inject 7-19 Units into the skin 3 (three) times daily before meals. Blood Glucose level: 140-199 - 7 units, 200-250 - 9 units, 251-299 - 13 units,  300-349 - 17 units,  350 or above 19 units.   Insulin Syringe-Needle U-100 25G X 1" 1 ML Misc For 4 times a day insulin SQ, 1 month supply. Diagnosis E11.65   magnesium gluconate 500 MG tablet Commonly known as:  MAGONATE Take 500 mg by mouth in the morning, at noon, and at bedtime.   melatonin 5 MG Tabs Take 2.5-5 mg by mouth at bedtime as needed (for sleep).   multivitamin tablet Take 1 tablet by mouth daily. Women's Daily Multivitamin   omeprazole 20 MG capsule Commonly known as: PRILOSEC Take 20 mg by mouth daily.   oxyCODONE 5 MG immediate release tablet Commonly known as: Roxicodone Take 1 tablet (5 mg total) by mouth every 4 (four) hours as needed for moderate pain or severe pain. What changed: how much to take   prednisoLONE acetate 1 % ophthalmic suspension Commonly known as: PRED FORTE Place 1 drop into both eyes at bedtime.   Semaglutide 14 MG Tabs Take 14 mg by mouth at bedtime. This is part of a clinical trial. The patient is either taking Semaglutide 14mg  tab daily OR a Placebo.   traMADol 50 MG tablet Commonly known as: ULTRAM Take 1 tablet (50 mg total) by mouth every 6 (six) hours as needed for moderate pain.   vitamin B-12 1000 MCG tablet Commonly known as: CYANOCOBALAMIN Take 1,000 mcg by mouth daily.       ASK your doctor about these medications    insulin aspart 100 UNIT/ML injection Commonly known as: NovoLOG Can switch to any approved brand if needed.  Before each meal 3 times a day , 140-199 - 2 units, 200-250 - 4 units, 251-299 - 8 units,  300-349 - 12 units,  350 or above 14 units.  Insulin PEN if approved, provide syringes and needles if needed.   insulin glargine 100 UNIT/ML injection Commonly known as: Lantus Inject 0.25 mLs (25 Units total) into the skin at bedtime. Dispense insulin pen if approved, if not dispense as needed syringes and needles for 1 month supply. Can switch to Levemir. Diagnosis E 11.65.   methylPREDNISolone 4 MG Tbpk tablet Commonly known as: MEDROL DOSEPAK follow package directions        Follow-up Information     Poggi, Marshall Cork, MD. Go in 2 week(s).   Specialty: Orthopedic Surgery Why: For staple removal Contact  information: [address] [place] [address] [phone]                 Signed: Prescott Parma, Abagale Boulos 10/14/2020, 6:34 AM   Objective: Vital signs in last 24 hours: Temp:  [97.2 F (36.2 C)-98.9 F (37.2 C)] 98.7 F (37.1 C) (06/24 0500) Pulse Rate:  [65-96] 90 (06/24 0500) Resp:  [13-20] 18 (06/24 0500) BP: (122-165)/(56-79) 155/71 (06/24 0500) SpO2:  [91 %-100 %] 91 % (06/24 0500)  Intake/Output from previous day:  Intake/Output Summary (Last 24 hours) at 10/14/2020 0634 Last data filed at 10/13/2020 1012  Gross per 24 hour  Intake 700 ml  Output 150 ml  Net 550 ml    Intake/Output this shift: No intake/output data recorded.  Labs: Recent Labs    10/13/20 0637 10/14/20 0430  HGB 13.6 12.8   Recent Labs    10/13/20 0637 10/14/20 0430  WBC  --  10.4  RBC  --  4.18  HCT 40.0 38.6  PLT  --  246   Recent Labs    10/13/20 0637 10/14/20 0430  NA 139 132*  K 4.0 4.1  CL 101 96*  CO2  --  30  BUN 23 20  CREATININE 1.00 0.93  GLUCOSE 276* 296*  CALCIUM  --  9.0   No results for input(s): LABPT, INR in the last 72 hours.  EXAM: General - Patient is Alert and Oriented Extremity - Neurovascular intact Sensation intact distally Dorsiflexion/Plantar flexion intact Incision - clean, dry, no drainage Motor Function -wrist dorsiflexion and volar flexion is intact.  Good grip strength.  Assessment/Plan: 1 Day Post-Op Procedure(s) (LRB): REVERSE SHOULDER ARTHROPLASTY (Left) Procedure(s) (LRB): REVERSE SHOULDER ARTHROPLASTY (Left) Past Medical History:  Diagnosis Date   Anemia    Anxiety    Aortic stenosis, mild    Arthritis    CKD (chronic kidney disease), stage III (HCC)    Complication of anesthesia    Propofol anaphylaxis   Cortical cataract    DDD (degenerative disc disease), lumbar    Depression    Dyspnea    GERD (gastroesophageal reflux disease)    Headache    Heart murmur    History of 2019 novel  coronavirus disease (COVID-19) 12/01/2018   HLD (hyperlipidemia)    Hypertension    Pneumonia    Schatzki's ring    Seasonal allergies    T2DM (type 2 diabetes mellitus) ([facility])    Valvular insufficiency    Active Problems:   Status post reverse arthroplasty of shoulder, left  Estimated body mass index is 30.01 kg/m as calculated from the following:   Height as of this encounter: _0  (1.626 m).   Weight as of this encounter: 79.3 kg. Advance diet Up with therapy D/C IV fluids Diet - Regular diet Follow up - in 2 weeks Activity - NWB Disposition - Home Condition Upon Discharge - Stable DVT Prophylaxis - Lovenox  Reche Dixon, PA-C Orthopaedic Surgery 10/14/2020, 6:34 AM

## 2020-10-14 NOTE — Progress Notes (Addendum)
Physical Therapy Treatment Patient Details Name: Samantha Freeman MRN: [id number] DOB: 1936/04/13 Today's Date: 10/14/2020    History of Present Illness Pt is a 85 y.o. F s/p L TSA on 10/13/20. PMH includes aortic stenosis, valvular insufficiency, HLD, CKD, DM, HTN, GERD, anxiety.    PT Comments    Pt continues to demonstrate good control of sit<>stand with unilateral UE for support, min-guard for safety. 3 L oxygen, nasal canula with transfers due to low SpO2 79% at rest. Ambulated 25 ft with hand held assist, 40 ft with [facility] min-guard/min assist intermittently due to decreased pt stability. Pt able to correct LOB with ankle strategy. Oxygen required 4-6 L with ambulation to maintain oxygen saturation of 90%. Pt left on 2L at rest for 92% SpO2. Pt indicated symptoms of fatigue and instability with exertion. Nursing and physician notified of low SpO2 at rest and exertion. Current discharge recommendations remain, the patient will have family for support as necessary. The patient would benefit from further skilled PT intervention to return to [facility] and maximize safety.   Follow Up Recommendations  Home health PT;Supervision - Intermittent     Equipment Recommendations  None recommended by PT    Recommendations for Other Services       Precautions / Restrictions Precautions Precautions: Shoulder Shoulder Interventions: Shoulder sling/immobilizer Required Braces or Orthoses: Sling Restrictions Weight Bearing Restrictions: Yes LUE Weight Bearing: Non weight bearing    Mobility  Bed Mobility               General bed mobility comments: seated in recliner chair upon entering the room    Transfers Overall transfer level: Needs assistance Equipment used: None Transfers: Sit to/from Stand Sit to Stand: Min guard Stand pivot transfers: Min guard       General transfer comment: utilizes LUE  Ambulation/Gait Ambulation/Gait assistance: Min guard;Min Web designer  (Feet): 40 Feet Assistive device: Straight cane Gait Pattern/deviations: Step-to pattern     General Gait Details: Decreased stability min-gaurd > min assist for support;   Chief Strategy Officer    Modified Rankin (Stroke Patients Only)       Balance Overall balance assessment: Needs assistance Sitting-balance support: Feet supported;Single extremity supported Sitting balance-Leahy Scale: Good     Standing balance support: During functional activity;Single extremity supported Standing balance-Leahy Scale: Poor Standing balance comment: Close min-gaurd/min assist intermittently, decreased stability with dyanamic movement                            Cognition Arousal/Alertness: Awake/alert Behavior During Therapy: WFL for tasks assessed/performed Overall Cognitive Status: Within Functional Limits for tasks assessed                                        Exercises      General Comments        Pertinent Vitals/Pain Pain Assessment: Faces Pain Score: 6  Faces Pain Scale: Hurts even more Pain Location: L shoulder Pain Descriptors / Indicators: Aching;Sore Pain Intervention(s): Limited activity within patient's tolerance;Monitored during session;Repositioned;Ice applied;Relaxation    Home Living Family/patient expects to be discharged to:: Private residence Living Arrangements: Alone Available Help at Discharge: Family;Available 24 hours/day Type of Home: House Home Access: Level entry   Home Layout: One level Home Equipment: Walker - 2 wheels;Cane -  single point;Shower seat      Prior Function Level of Independence: Independent      Comments: Pt drives and grocery shops. Her son and daughter in law are going to assist her at discharge   PT Goals (current goals can now be found in the care plan section) Acute Rehab PT Goals Patient Stated Goal: decrease pain and go home PT Goal Formulation: With  patient Time For Goal Achievement: 10/27/20 Potential to Achieve Goals: Good Progress towards PT goals: Progressing toward goals    Frequency    BID      PT Plan Current plan remains appropriate    Co-evaluation              AM-PAC PT "6 Clicks" Mobility   Outcome Measure  Help needed turning from your back to your side while in a flat bed without using bedrails?: A Little Help needed moving from lying on your back to sitting on the side of a flat bed without using bedrails?: A Little Help needed moving to and from a bed to a chair (including a wheelchair)?: A Lot Help needed standing up from a chair using your arms (e.g., wheelchair or bedside chair)?: A Lot Help needed to walk in hospital room?: A Little Help needed climbing 3-5 steps with a railing? : A Lot 6 Click Score: 15    End of Session Equipment Utilized During Treatment: Gait belt;Oxygen (2 L at rest, up to 6 L w/amb) Activity Tolerance: Patient tolerated treatment well Patient left: in chair;with call bell/phone within reach;with chair alarm set;with family/visitor present;with SCD's reapplied Nurse Communication: Mobility status PT Visit Diagnosis: Unsteadiness on feet (R26.81);Muscle weakness (generalized) (M62.81);Difficulty in walking, not elsewhere classified (R26.2);Pain Pain - Right/Left: Left Pain - part of body: Shoulder     Time: 6962-9528 PT Time Calculation (min) (ACUTE ONLY): 36 min  Charges:                        [employer], SPT

## 2020-10-14 NOTE — Discharge Instructions (Signed)
Samantha H. Patel, MD  [facility]  Phone: [phone]  Fax: [phone]   Discharge Instructions after Reverse Shoulder Replacement    1. Activity/Sling: You are to be non-weight bearing on operative extremity. A sling/shoulder immobilizer has been provided for you. Only remove the sling to perform elbow, wrist, and hand RoM exercises and hygiene/dressing. Active reaching and lifting are not permitted. You will be given further instructions on sling use at your first physical therapy visit and postoperative visit with Dr. Patel.   2. Dressings: Dressing may be removed at 1st physical therapy visit (~3-4 days after surgery). Afterwards, you may either leave open to air (if no drainage) or cover with dry, sterile dressing. If you have steri-strips on your wound, please do not remove them. They will fall off on their own. You may shower 5 days after surgery. Please pat incision dry. Do not rub or place any shear forces across incision. If there is drainage or any opening of incision after 5 days, please notify our offices immediately.    3. Driving:  Plan on not driving for six weeks. Please note that you are advised NOT to drive while taking narcotic pain medications as you may be impaired and unsafe to drive.   4. Medications:  - You have been provided a prescription for narcotic pain medicine (usually oxycodone). After surgery, take 1-2 narcotic tablets every 4 hours if needed for severe pain. Please start this as soon as you begin to start having pain (if you received a nerve block, start taking as soon as this wears off).  - A prescription for anti-nausea medication will be provided in case the narcotic medicine causes nausea - take 1 tablet every 6 hours only if nauseated.  - Take enteric coated aspirin 325 mg once daily for 6 weeks to prevent blood clots. Do not take aspirin if you have an aspirin sensitivity/allergy or asthma or are on an anticoagulant (blood thinner) already. If so, then  your home anticoagulant will be resume and managed - do not take aspirin. -Take tylenol 1000mg (2 Extra strength or 3 regular strength tablets) every 8 hours for pain. This will reduce the amount of narcotic medication needed. May stop tylenol when you are having minimal pain. - Take a stool softener (Colace, Dulcolax or Senakot) if you are using narcotic pain medications to help with constipation that is associated with narcotic use. - DO NOT take ANY nonsteroidal anti-inflammatory pain medications: Advil, Motrin, Ibuprofen, Aleve, Naproxen, or Naprosyn.   If you are taking prescription medication for anxiety, depression, insomnia, muscle spasm, chronic pain, or for attention deficit disorder you are advised that you are at a higher risk of adverse effects with use of narcotics post-op, including narcotic addiction/dependence, depressed breathing, death. If you use non-prescribed substances: alcohol, marijuana, cocaine, heroin, methamphetamines, etc., you are at a higher risk of adverse effects with use of narcotics post-op, including narcotic addiction/dependence, depressed breathing, death. You are advised that taking > 50 morphine milligram equivalents (MME) of narcotic pain medication per day results in twice the risk of overdose or death. For your prescription provided: oxycodone 5 mg - taking more than 6 tablets per day after the first few days of surgery.   5. Physical Therapy: 1-2 times per week for ~12 weeks. Therapy typically starts on post operative Day 3 or 4. You have been provided an order for physical therapy. The therapist will provide home exercises. Please contact our offices if this appointment has not been scheduled.      6. Work: May do light duty/desk job in approximately 2 weeks when off of narcotics, pain is well-controlled, and swelling has decreased if able to function with one arm in sling. Full work may take 6 weeks if light motions and function of both arms is required.  Lifting jobs may require 12 weeks.   7. Post-Op Appointments: Your first post-op appointment will be with Dr. Posey Pronto in approximately 2 weeks time.    If you find that they have not been scheduled please call the Orthopaedic Appointment front desk at [phone].                               Lenoria Chime, MD [facility] Phone: [phone] Fax: [phone]   REVERSE SHOULDER ARTHROPLASTY REHAB GUIDELINES   These guidelines should be tailored to individual patients based on their rehab goals, age, precautions, quality of repair, etc.  Progression should be based on patient progress and approval by the referring physician.  PHASE 1 - Day 1 through Week 2  GENERAL GUIDELINES AND PRECAUTIONS Sling wear 24/7 except during grooming and home exercises (3 to 5 times daily) Avoid shoulder extension such that the arm is posterior the frontal plane.  When patients recline, a pillow should be placed behind the upper arm and sling should be on.  They should be advised to always be able to see the elbow Avoid combined IR/ADD/EXT, such as hand behind back to prevent dislocation Avoid combined IR and ADD such as reaching across the chest to prevent dislocation No AROM No submersion in pool/water for 4 weeks No weight bearing through operative arm (as in transfers, walker use, etc.)  GOALS Maintain integrity of joint replacement; protect soft tissue healing Increase PROM for elevation to 120 and ER to 30 (will remain the goal for first 6 weeks) Optimize distal UE circulation and muscle activity (elbow, wrist and hand) Instruct in use of sling for proper fit, polar care device for ice application after HEP, signs/symptoms of infection  EXERCISES Active elbow, wrist and hand Passive forward elevation in scapular plane to 90-120 max motion; ER in scapular plane to 30 Active scapular retraction with arms resting in neutral position  CRITERIA TO PROGRESS TO PHASE  2 Low pain (less than 3/10) with shoulder PROM Healing of incision without signs of infection Clearance by MD to advance after 2 week MD check up  PHASE 2 - 2 weeks - 6 weeks  GENERAL GUIDELINES AND PRECAUTIONS Sling may be removed while at home; worn in community without abduction pillow May use arm for light activities of daily living (such as feeding, brushing teeth, dressing.) with elbow near  the side of the body  and arm in front of the body- no active lifting of the arm May submerge in water (tub, pool, [place], etc.) after 4 weeks Continue to avoid WBing through the operative arm Continue to avoid combined IR/EXT/ADD (hand behind the back) and IR/ADD  (reaching across chest) for dislocation precautions  GOALS  Achieve passive elevation to 120 and ER to 30  Low (less than 3/10) to no pain  Ability to fire all heads of the deltoid  EXERCISES May discontinue grip, and active elbow and wrist exercises since using the arm in ADL's  with sling removed around the home Continue passive elevation to 120 and ER to 30, both in scapular plane with arm supported on table top Add submaximal isometrics, pain free effort, for all functional  heads of deltoid (anterior, posterior, middle)  Ensure that with posterior deltoid isometric the shoulder does not move into extension and the arm remains anterior the frontal plane At 4 weeks:  begin to place arm in balanced position of 90 deg elevation in supine; when patient able to hold this position with ease, may begin reverse pendulums clockwise and counterclockwise  CRITERIA TO PROGRESS TO PHASE 3 Passive forward elevation in scapular plane to 120; passive ER in scapular plane to 30 Ability to fire isometrically all heads of the deltoid muscle without pain Ability to place and hold the arm in balanced position (90 deg elevation in supine)  PHASE 3 - 6 weeks to 3 months  GENERAL GUIDELINES AND PRECAUTIONS Discontinue use of sling Avoid forcing end  range motion in any direction to prevent dislocation  May advance use of the arm actively in ADL's without being restricted to arm by the side of the body, however, avoid heavy lifting and sports (forever!) May initiate functional IR behind the back gently NO UPPER BODY ERGOMETER   GOALS Optimize PROM for elevation and ER in scapular plane with realistic expectation that max  mobility for elevation is usually around 145-160 passively; ER 40 to 50 passively; functional IR to L1 Recover AROM to approach as close to PROM available as possible; may expect 135-150 deg active elevation; 30 deg active ER; active functional IR to L1 Establish dynamic stability of the shoulder with deltoid and periscapular muscle gradual strengthening  EXERCISES Forward elevation in scapular plane active progression: supine to incline, to vertical; short to long lever arm Balanced position long lever arm AROM Active ER/IR with arm at side Scapular retraction with light band resistance Functional IR with hand slide up back - very gentle and gradual NO UPPER BODY ERGOMETER     CRITERIA TO PROGRESS TO PHASE 4  AROM equals/approaches PROM with good mechanics for elevation   No pain  Higher level demand on shoulder than ADL functions   PHASE 4 12 months and beyond  GENERAL GUIDELINES AND PRECAUTIONS No heavy lifting and no overhead sports No heavy pushing activity Gradually increase strength of deltoid and scapular stabilizers; also the rotator cuff if present with weights not to exceed 5 lbs NO UPPER BODY ERGOMETER   GOALS  Optimize functional use of the operative UE to meet the desired demands  Gradual increase in deltoid, scapular muscle, and rotator cuff strength  Pain free functional activities   EXERCISES Add light hand weights for deltoid up to and not to exceed 3 lbs for anterior and posterior with long arm lift against gravity; elbow bent to 90 deg for abduction in scapular plane Theraband  progression for extension to hip with scapular depression/retraction Theraband progression for serratus anterior punches in supine; avoid wall, incline or prone pressups for serratus anterior End range stretching gently without forceful overpressure in all planes (elevation in scapular plane, ER in scapular plane, functional IR) with stretching done for life as part of a daily routine NO UPPER BODY ERGOMETER     CRITERIA FOR DISCHARGE FROM SKILLED PHYSICAL THERAPY  Pain free AROM for shoulder elevation (expect around 135-150)  Functional strength for all ADL's, work tasks, and hobbies approved by Psychologist, sport and exercise  Independence with home maintenance program   NOTES: 1. With proper exercise, motion, strength, and function continue to improve even after one year. 2. The complication rate after surgery is 5 - 8%. Complications include infection, fracture, heterotopic bone formation, nerve injury, instability, rotator cuff tear,  and tuberosity nonunion. Please look for clinical signs, unusual symptoms, or lack of progress with therapy and report those to Dr. Posey Pronto. Prefer more communication than less.  3. The therapy plan above only serves as a guide. Please be aware of specific individualized patient instructions as written on the prescription or through discussions with the surgeon. 4. Please call Dr. Roland Rack if you have any specific questions or concerns [phone]

## 2020-10-14 NOTE — Progress Notes (Signed)
Oxygen Qualification Note  SaO2 on room air at rest = 80% SaO2 on 4 liters of O2 while ambulating = 90%   Lieutenant Diego PT, DPT 10:10 AM,10/14/20

## 2020-10-14 NOTE — Evaluation (Signed)
Occupational Therapy Evaluation Patient Details Name: Samantha Freeman MRN: [id number] DOB: 09-18-1935 Today's Date: 10/14/2020    History of Present Illness Pt is a 85 y.o. F s/p L TSA on 10/13/20. PMH includes aortic stenosis, valvular insufficiency, HLD, CKD, DM, HTN, GERD, anxiety.   Clinical Impression   Upon entering the room, pt seated in recliner chair with son present in the room. Pt with 6/10 pain in L shoulder but agreeable to OT intervention. OT reviewed NWB precautions, sling use, polar care, and assisted pt with dressing this session. Pt's son returning demonstrations and provided handouts for home use. Pt performing 5 -10 reps of hand/wrist/elbow exercises with increased pain during elbow flexion/extension. Pt needing mod A this session for UB self care and min A for LB self care. Pt lives alone but will have family to assist at discharge. Pt transitioned to PT session and pt with no further questions at this time. All needs within reach.     Follow Up Recommendations  Follow surgeon's recommendation for DC plan and follow-up therapies    Equipment Recommendations  None recommended by OT       Precautions / Restrictions Precautions Precautions: Shoulder Shoulder Interventions: Shoulder sling/immobilizer Required Braces or Orthoses: Sling Restrictions Weight Bearing Restrictions: Yes LUE Weight Bearing: Non weight bearing      Mobility Bed Mobility               General bed mobility comments: seated in recliner chair upon entering the room    Transfers Overall transfer level: Needs assistance Equipment used: None Transfers: Sit to/from Stand;Stand Pivot Transfers Sit to Stand: Min guard Stand pivot transfers: Min guard       General transfer comment: min A for safety and balance    Balance Overall balance assessment: Needs assistance Sitting-balance support: Feet supported;Single extremity supported Sitting balance-Leahy Scale: Good     Standing  balance support: During functional activity;Single extremity supported Standing balance-Leahy Scale: Fair                             ADL either performed or assessed with clinical judgement   ADL Overall ADL's : Needs assistance/impaired Eating/Feeding: Set up;Sitting Eating/Feeding Details (indicate cue type and reason): to open containers and cut food             Upper Body Dressing : Moderate assistance;Sitting Upper Body Dressing Details (indicate cue type and reason): polar care, shirt, and sling Lower Body Dressing: Minimal assistance Lower Body Dressing Details (indicate cue type and reason): to thread pants onto B feet Toilet Transfer: Min Psychiatric nurse Details (indicate cue type and reason): simulated         Functional mobility during ADLs: Min guard       Vision Patient Visual Report: No change from baseline              Pertinent Vitals/Pain Pain Assessment: Faces Faces Pain Scale: Hurts even more Pain Location: L shoulder Pain Descriptors / Indicators: Aching;Sore Pain Intervention(s): Limited activity within patient's tolerance;Monitored during session;Patient requesting pain meds-RN notified;Repositioned     Hand Dominance Right   Extremity/Trunk Assessment Upper Extremity Assessment Upper Extremity Assessment: Generalized weakness RUE Deficits / Details: no formal assessment completed secondary to surgical precautions. WFLs for hand/wrist/elbow.   Lower Extremity Assessment Lower Extremity Assessment: Overall WFL for tasks assessed       Communication Communication Communication: No difficulties   Cognition Arousal/Alertness: Awake/alert Behavior During Therapy:  WFL for tasks assessed/performed Overall Cognitive Status: Within Functional Limits for tasks assessed                                                Home Living Family/patient expects to be discharged to:: Private residence Living  Arrangements: Alone Available Help at Discharge: Family;Available 24 hours/day Type of Home: House Home Access: Level entry     Home Layout: One level     Bathroom Shower/Tub: Occupational psychologist: Handicapped height     Home Equipment: Environmental consultant - 2 wheels;Cane - single point;Shower seat          Prior Functioning/Environment Level of Independence: Independent        Comments: Pt drives and grocery shops. Her son and daughter in law are going to assist her at discharge        OT Problem List: Decreased strength;Decreased activity tolerance;Impaired balance (sitting and/or standing);Decreased safety awareness;Pain;Impaired sensation;Impaired UE functional use;Decreased knowledge of precautions;Decreased knowledge of use of DME or AE         OT Goals(Current goals can be found in the care plan section) Acute Rehab OT Goals Patient Stated Goal: decrease pain and go home OT Goal Formulation: With patient/family Time For Goal Achievement: 10/28/20 Potential to Achieve Goals: Fair ADL Goals Pt Will Perform Grooming: with modified independence;standing Pt Will Perform Upper Body Dressing: with min assist;sitting Pt Will Transfer to Toilet: ambulating;with supervision Pt Will Perform Toileting - Clothing Manipulation and hygiene: sit to/from stand;with supervision   AM-PAC OT "6 Clicks" Daily Activity     Outcome Measure Help from another person eating meals?: A Little Help from another person taking care of personal grooming?: A Little Help from another person toileting, which includes using toliet, bedpan, or urinal?: A Little Help from another person bathing (including washing, rinsing, drying)?: A Little Help from another person to put on and taking off regular upper body clothing?: A Lot Help from another person to put on and taking off regular lower body clothing?: A Little 6 Click Score: 17   End of Session Equipment Utilized During Treatment: Other  (comment) (L UE sling) Nurse Communication: Mobility status  Activity Tolerance: Patient tolerated treatment well Patient left: in chair;with call bell/phone within reach;with family/visitor present;Other (comment) (PT arriving for session)  OT Visit Diagnosis: Unsteadiness on feet (R26.81);Muscle weakness (generalized) (M62.81)                Time: 8413-2440 OT Time Calculation (min): 36 min Charges:  OT General Charges $OT Visit: 1 Visit OT Evaluation $OT Eval Moderate Complexity: 1 Mod OT Treatments $Self Care/Home Management : 23-37 mins Darleen Crocker, MS, OTR/L , CBIS ascom [phone]  10/14/20, 9:51 AM

## 2020-10-14 NOTE — Progress Notes (Signed)
  Subjective: 1 Day Post-Op Procedure(s) (LRB): REVERSE SHOULDER ARTHROPLASTY (Left) Patient reports pain as mild.   Patient is well, and has had no acute complaints or problems Plan is to go Home after hospital stay. Negative for chest pain and shortness of breath Fever: no Gastrointestinal:Negative for nausea and vomiting  Objective: Vital signs in last 24 hours: Temp:  [97.2 F (36.2 C)-98.9 F (37.2 C)] 98.7 F (37.1 C) (06/24 0500) Pulse Rate:  [65-96] 90 (06/24 0500) Resp:  [13-20] 18 (06/24 0500) BP: (122-165)/(56-79) 155/71 (06/24 0500) SpO2:  [91 %-100 %] 91 % (06/24 0500)  Intake/Output from previous day:  Intake/Output Summary (Last 24 hours) at 10/14/2020 0627 Last data filed at 10/13/2020 1012 Gross per 24 hour  Intake 700 ml  Output 150 ml  Net 550 ml    Intake/Output this shift: No intake/output data recorded.  Labs: Recent Labs    10/13/20 0637 10/14/20 0430  HGB 13.6 12.8   Recent Labs    10/13/20 0637 10/14/20 0430  WBC  --  10.4  RBC  --  4.18  HCT 40.0 38.6  PLT  --  246   Recent Labs    10/13/20 0637 10/14/20 0430  NA 139 132*  K 4.0 4.1  CL 101 96*  CO2  --  30  BUN 23 20  CREATININE 1.00 0.93  GLUCOSE 276* 296*  CALCIUM  --  9.0   No results for input(s): LABPT, INR in the last 72 hours.   EXAM General - Patient is Alert and Oriented Extremity - Neurovascular intact Sensation intact distally Dorsiflexion/Plantar flexion intact Dressing/Incision - clean, dry, no drainage Motor Function - intact, moving fingers and wrist well on exam.   Past Medical History:  Diagnosis Date   Anemia    Anxiety    Aortic stenosis, mild    Arthritis    CKD (chronic kidney disease), stage III (HCC)    Complication of anesthesia    Propofol anaphylaxis   Cortical cataract    DDD (degenerative disc disease), lumbar    Depression    Dyspnea    GERD (gastroesophageal reflux disease)    Headache    Heart murmur    History of 2019  novel coronavirus disease (COVID-19) 12/01/2018   HLD (hyperlipidemia)    Hypertension    Pneumonia    Schatzki's ring    Seasonal allergies    T2DM (type 2 diabetes mellitus) (HCC)    Valvular insufficiency     Assessment/Plan: 1 Day Post-Op Procedure(s) (LRB): REVERSE SHOULDER ARTHROPLASTY (Left) Active Problems:   Status post reverse arthroplasty of shoulder, left  Estimated body mass index is 30.01 kg/m as calculated from the following:   Height as of this encounter: 5\' 4"  (1.626 m).   Weight as of this encounter: 79.3 kg. Advance diet Up with therapy D/C IV fluids Discharge home  DVT Prophylaxis - Lovenox Non Weight-Bearing to Left shoulder  Reche Dixon, PA-C Orthopaedic Surgery 10/14/2020, 6:27 AM

## 2020-10-14 NOTE — TOC Progression Note (Addendum)
Transition of Care [facility]) - Progression Note    Patient Details  Name: Samantha Freeman MRN: [id number] Date of Birth: Aug 28, 1935  Transition of Care [facility]) CM/SW Keddie, RN Phone Number: 10/14/2020, 10:00 AM  Clinical Narrative:     Met with the patient to discuss DC plan and needs She lives at home with her husband She has transportation with her husband and can afford her medicaitons She is set up with [place] She has a cane and a walker at home and has a tall toilet, she does not want a 3 in 1,  She needs Home O2 at 4 liters and [employer] will bring to the room prior to DC She has no additional needs at this time       Expected Discharge Plan and Services           Expected Discharge Date: 10/14/20                                     Social Determinants of Health (SDOH) Interventions    Readmission Risk Interventions No flowsheet data found.

## 2021-07-11 ENCOUNTER — Other Ambulatory Visit: Payer: Self-pay | Admitting: Internal Medicine

## 2021-07-11 DIAGNOSIS — Z1231 Encounter for screening mammogram for malignant neoplasm of breast: Secondary | ICD-10-CM

## 2021-08-04 ENCOUNTER — Ambulatory Visit
Admission: RE | Admit: 2021-08-04 | Discharge: 2021-08-04 | Disposition: A | Discharge status: Home / Self Care | Payer: Medicare Other | Source: Ambulatory Visit | Attending: Internal Medicine | Admitting: Internal Medicine

## 2021-08-04 DIAGNOSIS — Z1231 Encounter for screening mammogram for malignant neoplasm of breast: Secondary | ICD-10-CM

## 2021-08-07 ENCOUNTER — Other Ambulatory Visit: Payer: Self-pay | Admitting: Internal Medicine

## 2021-08-07 DIAGNOSIS — R928 Other abnormal and inconclusive findings on diagnostic imaging of breast: Secondary | ICD-10-CM

## 2021-08-31 ENCOUNTER — Ambulatory Visit
Admission: RE | Admit: 2021-08-31 | Discharge: 2021-08-31 | Disposition: A | Discharge status: Home / Self Care | Payer: Medicare Other | Source: Ambulatory Visit | Attending: Internal Medicine | Admitting: Internal Medicine

## 2021-08-31 DIAGNOSIS — R928 Other abnormal and inconclusive findings on diagnostic imaging of breast: Secondary | ICD-10-CM

## 2021-09-28 ENCOUNTER — Other Ambulatory Visit: Payer: Self-pay | Admitting: Internal Medicine

## 2021-09-28 ENCOUNTER — Other Ambulatory Visit (HOSPITAL_COMMUNITY): Payer: Self-pay | Admitting: Internal Medicine

## 2021-09-28 DIAGNOSIS — M25562 Pain in left knee: Secondary | ICD-10-CM

## 2022-03-05 ENCOUNTER — Ambulatory Visit: Payer: Medicare Other | Admitting: Dermatology

## 2022-03-05 DIAGNOSIS — D229 Melanocytic nevi, unspecified: Secondary | ICD-10-CM

## 2022-03-05 DIAGNOSIS — I8393 Asymptomatic varicose veins of bilateral lower extremities: Secondary | ICD-10-CM

## 2022-03-05 DIAGNOSIS — Z1283 Encounter for screening for malignant neoplasm of skin: Secondary | ICD-10-CM | POA: Diagnosis not present

## 2022-03-05 DIAGNOSIS — L821 Other seborrheic keratosis: Secondary | ICD-10-CM

## 2022-03-05 DIAGNOSIS — L570 Actinic keratosis: Secondary | ICD-10-CM | POA: Diagnosis not present

## 2022-03-05 DIAGNOSIS — L578 Other skin changes due to chronic exposure to nonionizing radiation: Secondary | ICD-10-CM | POA: Diagnosis not present

## 2022-03-05 DIAGNOSIS — L814 Other melanin hyperpigmentation: Secondary | ICD-10-CM

## 2022-03-05 DIAGNOSIS — I781 Nevus, non-neoplastic: Secondary | ICD-10-CM

## 2022-03-05 NOTE — Patient Instructions (Addendum)
Seborrheic Keratosis  What causes seborrheic keratoses? Seborrheic keratoses are harmless, common skin growths that first appear during adult life.  As time goes by, more growths appear.  Some people may develop a large number of them.  Seborrheic keratoses appear on both covered and uncovered body parts.  They are not caused by sunlight.  The tendency to develop seborrheic keratoses can be inherited.  They vary in color from skin-colored to gray, brown, or even black.  They can be either smooth or have a rough, warty surface.   Seborrheic keratoses are superficial and look as if they were stuck on the skin.  Under the microscope this type of keratosis looks like layers upon layers of skin.  That is why at times the top layer may seem to fall off, but the rest of the growth remains and re-grows.    Treatment Seborrheic keratoses do not need to be treated, but can easily be removed in the office.  Seborrheic keratoses often cause symptoms when they rub on clothing or jewelry.  Lesions can be in the way of shaving.  If they become inflamed, they can cause itching, soreness, or burning.  Removal of a seborrheic keratosis can be accomplished by freezing, burning, or surgery. If any spot bleeds, scabs, or grows rapidly, please return to have it checked, as these can be an indication of a skin cancer.   Cryotherapy Aftercare  Wash gently with soap and water everyday.   Apply Vaseline and Band-Aid daily until healed.    Melanoma ABCDEs  Melanoma is the most dangerous type of skin cancer, and is the leading cause of death from skin disease.  You are more likely to develop melanoma if you: Have light-colored skin, light-colored eyes, or red or blond hair Spend a lot of time in the sun Tan regularly, either outdoors or in a tanning bed Have had blistering sunburns, especially during childhood Have a close family member who has had a melanoma Have atypical moles or large birthmarks  Early detection  of melanoma is key since treatment is typically straightforward and cure rates are extremely high if we catch it early.   The first sign of melanoma is often a change in a mole or a new dark spot.  The ABCDE system is a way of remembering the signs of melanoma.  A for asymmetry:  The two halves do not match. B for border:  The edges of the growth are irregular. C for color:  A mixture of colors are present instead of an even brown color. D for diameter:  Melanomas are usually (but not always) greater than 74m - the size of a pencil eraser. E for evolution:  The spot keeps changing in size, shape, and color.  Please check your skin once per month between visits. You can use a small mirror in front and a large mirror behind you to keep an eye on the back side or your body.   If you see any new or changing lesions before your next follow-up, please call to schedule a visit.  Please continue daily skin protection including broad spectrum sunscreen SPF 30+ to sun-exposed areas, reapplying every 2 hours as needed when you're outdoors.    Due to recent changes in healthcare laws, you may see results of your pathology and/or laboratory studies on MyChart before the doctors have had a chance to review them. We understand that in some cases there may be results that are confusing or concerning to you. Please understand  that not all results are received at the same time and often the doctors may need to interpret multiple results in order to provide you with the best plan of care or course of treatment. Therefore, we ask that you please give [place] 2 business days to thoroughly review all your results before contacting the office for clarification. Should we see a critical lab result, you will be contacted sooner.   If You Need Anything After Your Visit  If you have any questions or concerns for your doctor, please call our main line at [phone] and press option 4 to reach your doctor's medical assistant.  If no one answers, please leave a voicemail as directed and we will return your call as soon as possible. Messages left after 4 pm will be answered the following business day.   You may also send [place] a message via [place]. We typically respond to MyChart messages within 1-2 business days.  For prescription refills, please ask your pharmacy to contact our office. Our fax number is [phone].  If you have an urgent issue when the clinic is closed that cannot wait until the next business day, you can page your doctor at the number below.    Please note that while we do our best to be available for urgent issues outside of office hours, we are not available 24/7.   If you have an urgent issue and are unable to reach [place], you may choose to seek medical care at your doctor's office, retail clinic, urgent care center, or emergency room.  If you have a medical emergency, please immediately call 911 or go to the emergency department.  Pager Numbers  - Dr. Nehemiah Massed: [phone]  - Dr. Laurence Ferrari: [phone]  - Dr. Nicole Kindred: [phone]  In the event of inclement weather, please call our main line at [phone] for an update on the status of any delays or closures.  Dermatology Medication Tips: Please keep the boxes that topical medications come in in order to help keep track of the instructions about where and how to use these. Pharmacies typically print the medication instructions only on the boxes and not directly on the medication tubes.   If your medication is too expensive, please contact our office at [phone] option 4 or send [place] a message through [place].   We are unable to tell what your co-pay for medications will be in advance as this is different depending on your insurance coverage. However, we may be able to find a substitute medication at lower cost or fill out paperwork to get insurance to cover a needed medication.   If a prior authorization is required to get your medication  covered by your insurance company, please allow [place] 1-2 business days to complete this process.  Drug prices often vary depending on where the prescription is filled and some pharmacies may offer cheaper prices.  The website www.goodrx.com contains coupons for medications through different pharmacies. The prices here do not account for what the cost may be with help from insurance (it may be cheaper with your insurance), but the website can give you the price if you did not use any insurance.  - You can print the associated coupon and take it with your prescription to the pharmacy.  - You may also stop by our office during regular business hours and pick up a GoodRx coupon card.  - If you need your prescription sent electronically to a different pharmacy, notify our office through [facility] or by  phone at [phone] option 4.     Si Usted Necesita Algo Despus de Su Visita  Tambin puede enviarnos un mensaje a travs de Pharmacist, community. Por lo general respondemos a los mensajes de MyChart en el transcurso de 1 a 2 das hbiles.  Para renovar recetas, por favor pida a su farmacia que se ponga en contacto con nuestra oficina. Harland Dingwall de fax es [place] [phone].  Si tiene un asunto urgente cuando la clnica est cerrada y que no puede esperar hasta el siguiente da hbil, puede llamar/localizar a su doctor(a) al nmero que aparece a continuacin.   Por favor, tenga en cuenta que aunque hacemos todo lo posible para estar disponibles para asuntos urgentes fuera del horario de [place], no estamos disponibles las 24 horas del da, los 7 das de la [place].   Si tiene un problema urgente y no puede comunicarse con nosotros, puede optar por buscar atencin mdica  en el consultorio de su doctor(a), en una clnica privada, en un centro de atencin urgente o en una sala de emergencias.  Si tiene Engineering geologist, por favor llame inmediatamente al 911 o vaya a la sala de  emergencias.  Nmeros de bper  - Dr. Nehemiah Massed: [phone]  - Dra. Moye: [phone]  - Dra. Nicole Kindred: [phone]  En caso de inclemencias del [place], por favor llame a Johnsie Kindred principal al [phone] para una actualizacin sobre el [place] de cualquier retraso o cierre.  Consejos para la medicacin en dermatologa: Por favor, guarde las cajas en las que vienen los medicamentos de uso tpico para ayudarle a seguir las instrucciones sobre dnde y cmo usarlos. Las farmacias generalmente imprimen las instrucciones del medicamento slo en las cajas y no directamente en los tubos del [place].   Si su medicamento es muy caro, por favor, pngase en contacto con Zigmund Daniel llamando al [phone] y presione la opcin 4 o envenos un mensaje a travs de Pharmacist, community.   No podemos decirle cul ser su copago por los medicamentos por adelantado ya que esto es diferente dependiendo de la cobertura de su seguro. Sin embargo, es posible que podamos encontrar un medicamento sustituto a Electrical engineer un formulario para que el seguro cubra el medicamento que se considera necesario.   Si se requiere una autorizacin previa para que su compaa de seguros [place] su medicamento, por favor permtanos de 1 a 2 das hbiles para completar este proceso.  Los precios de los medicamentos varan con frecuencia dependiendo del Environmental consultant de dnde se surte la receta y alguna farmacias pueden ofrecer precios ms baratos.  El sitio web www.goodrx.com tiene cupones para medicamentos de Airline pilot. Los precios aqu no tienen en cuenta lo que podra costar con la ayuda del seguro (puede ser ms barato con su seguro), pero el sitio web puede darle el precio si no utiliz Research scientist (physical sciences).  - Puede imprimir el cupn correspondiente y llevarlo con su receta a la farmacia.  - Tambin puede pasar por nuestra oficina durante el horario de atencin regular y Charity fundraiser una tarjeta de cupones de GoodRx.  - Si  necesita que su receta se enve electrnicamente a una farmacia diferente, informe a nuestra oficina a travs de MyChart de  o por telfono llamando al [phone] y presione la opcin 4.

## 2022-03-05 NOTE — Progress Notes (Signed)
New Patient Visit  Subjective  Samantha Freeman is a 86 y.o. female who presents for the following: Annual Exam (No hx of skin cancer. Patient advises she did have something removed from left knee by Dr. Phillip Heal, previous dermatologist. ).  Patient does have a spot at inframammary that is raised.   The following portions of the chart were reviewed this encounter and updated as appropriate:       Review of Systems:  No other skin or systemic complaints except as noted in HPI or Assessment and Plan.  Objective  Well appearing patient in no apparent distress; mood and affect are within normal limits.  A full examination was performed including scalp, head, eyes, ears, nose, lips, neck, chest, axillae, abdomen, back, buttocks, bilateral upper extremities, bilateral lower extremities, hands, feet, fingers, toes, fingernails, and toenails. All findings within normal limits unless otherwise noted below.  nasal tip x 1 Erythematous thin papules/macules with gritty scale.   face Broken blood vessels    Assessment & Plan  AK (actinic keratosis) nasal tip x 1  Actinic keratoses are precancerous spots that appear secondary to cumulative UV radiation exposure/sun exposure over time. They are chronic with expected duration over 1 year. A portion of actinic keratoses will progress to squamous cell carcinoma of the skin. It is not possible to reliably predict which spots will progress to skin cancer and so treatment is recommended to prevent development of skin cancer.  Recommend daily broad spectrum sunscreen SPF 30+ to sun-exposed areas, reapply every 2 hours as needed.  Recommend staying in the shade or wearing long sleeves, sun glasses (UVA+UVB protection) and wide brim hats (4-inch brim around the entire circumference of the hat). Call for new or changing lesions.  Destruction of lesion - nasal tip x 1  Destruction method: cryotherapy   Informed consent: discussed and consent obtained    Lesion destroyed using liquid nitrogen: Yes   Region frozen until ice ball extended beyond lesion: Yes   Outcome: patient tolerated procedure well with no complications   Post-procedure details: wound care instructions given   Additional details:  Prior to procedure, discussed risks of blister formation, small wound, skin dyspigmentation, or rare scar following cryotherapy. Recommend Vaseline ointment to treated areas while healing.   Telangiectasia face  Likely due to actinic damage. Benign-appearing.  Observation.  Call clinic for new or changing lesions.  Recommend daily use of broad spectrum spf 30+ sunscreen to sun-exposed areas.     Lentigines - Scattered tan macules - Due to sun exposure - Benign-appearing, observe - Recommend daily broad spectrum sunscreen SPF 30+ to sun-exposed areas, reapply every 2 hours as needed. - Call for any changes  Seborrheic Keratoses - Stuck-on, waxy, tan-brown papules and/or plaques, left medial canthus, R inframammary/abdomen - Benign-appearing - Discussed benign etiology and prognosis. - Observe - Call for any changes  Melanocytic Nevi - Tan-brown and/or pink-flesh-colored symmetric macules and papules - Benign appearing on exam today - Observation - Call clinic for new or changing moles - Recommend daily use of broad spectrum spf 30+ sunscreen to sun-exposed areas.   Hemangiomas - Red papules - Discussed benign nature - Observe - Call for any changes  Actinic Damage - Chronic condition, secondary to cumulative UV/sun exposure - diffuse scaly erythematous macules with underlying dyspigmentation - Recommend daily broad spectrum sunscreen SPF 30+ to sun-exposed areas, reapply every 2 hours as needed.  - Staying in the shade or wearing long sleeves, sun glasses (UVA+UVB protection) and wide brim  hats (4-inch brim around the entire circumference of the hat) are also recommended for sun protection.  - Call for new or changing  lesions.  Skin cancer screening performed today.  Varicose Veins/Spider Veins - Dilated blue, purple or red veins at the lower extremities - Reassured - Smaller vessels can be treated by sclerotherapy (a procedure to inject a medicine into the veins to make them disappear) if desired, but the treatment is not covered by insurance. Larger vessels may be covered if symptomatic and we would refer to vascular surgeon if treatment desired.  Return in about 1 year (around 03/06/2023) for TBSE.   Graciella Belton, RMA, am acting as scribe for Brendolyn Patty, MD .  Documentation: I have reviewed the above documentation for accuracy and completeness, and I agree with the above.  Brendolyn Patty MD

## 2022-05-10 ENCOUNTER — Other Ambulatory Visit: Payer: Self-pay | Admitting: Physical Medicine and Rehabilitation

## 2022-05-10 DIAGNOSIS — M5412 Radiculopathy, cervical region: Secondary | ICD-10-CM

## 2022-05-17 ENCOUNTER — Ambulatory Visit
Admission: RE | Admit: 2022-05-17 | Discharge: 2022-05-17 | Disposition: A | Discharge status: Home / Self Care | Payer: Medicare Other | Source: Ambulatory Visit | Attending: Physical Medicine and Rehabilitation | Admitting: Physical Medicine and Rehabilitation

## 2022-05-17 ENCOUNTER — Ambulatory Visit: Payer: Medicare Other

## 2022-05-17 DIAGNOSIS — M5412 Radiculopathy, cervical region: Secondary | ICD-10-CM | POA: Diagnosis not present

## 2022-05-17 MED ORDER — GADOBUTROL 1 MMOL/ML IV SOLN: 7.5000 mL | Freq: Once | INTRAVENOUS | Status: DC | PRN

## 2023-01-30 ENCOUNTER — Inpatient Hospital Stay
Admit: 2023-01-30 | Discharge: 2023-01-30 | Disposition: A | Discharge status: Home / Self Care | Payer: Medicare Other | Attending: Internal Medicine | Admitting: Internal Medicine

## 2023-01-30 ENCOUNTER — Emergency Department: Payer: Medicare Other

## 2023-01-30 ENCOUNTER — Inpatient Hospital Stay
Admission: EM | Admit: 2023-01-30 | Discharge: 2023-02-04 | DRG: 308 | Disposition: A | Discharge status: Skilled Nursing Facility | Payer: Medicare Other | Attending: Internal Medicine | Admitting: Internal Medicine

## 2023-01-30 ENCOUNTER — Other Ambulatory Visit: Payer: Self-pay

## 2023-01-30 DIAGNOSIS — J209 Acute bronchitis, unspecified: Secondary | ICD-10-CM | POA: Diagnosis present

## 2023-01-30 DIAGNOSIS — N1831 Chronic kidney disease, stage 3a: Secondary | ICD-10-CM | POA: Diagnosis present

## 2023-01-30 DIAGNOSIS — R0902 Hypoxemia: Principal | ICD-10-CM

## 2023-01-30 DIAGNOSIS — Z79899 Other long term (current) drug therapy: Secondary | ICD-10-CM | POA: Diagnosis not present

## 2023-01-30 DIAGNOSIS — Z947 Corneal transplant status: Secondary | ICD-10-CM

## 2023-01-30 DIAGNOSIS — Z66 Do not resuscitate: Secondary | ICD-10-CM | POA: Diagnosis present

## 2023-01-30 DIAGNOSIS — E66811 Body mass index (BMI) 33.0-33.9, adult: Secondary | ICD-10-CM

## 2023-01-30 DIAGNOSIS — E785 Hyperlipidemia, unspecified: Secondary | ICD-10-CM | POA: Diagnosis present

## 2023-01-30 DIAGNOSIS — K219 Gastro-esophageal reflux disease without esophagitis: Secondary | ICD-10-CM | POA: Diagnosis present

## 2023-01-30 DIAGNOSIS — E1165 Type 2 diabetes mellitus with hyperglycemia: Secondary | ICD-10-CM | POA: Diagnosis present

## 2023-01-30 DIAGNOSIS — J9600 Acute respiratory failure, unspecified whether with hypoxia or hypercapnia: Secondary | ICD-10-CM | POA: Diagnosis present

## 2023-01-30 DIAGNOSIS — Z6833 Body mass index (BMI) 33.0-33.9, adult: Secondary | ICD-10-CM | POA: Diagnosis not present

## 2023-01-30 DIAGNOSIS — E8770 Fluid overload, unspecified: Secondary | ICD-10-CM | POA: Diagnosis present

## 2023-01-30 DIAGNOSIS — F32A Depression, unspecified: Secondary | ICD-10-CM | POA: Diagnosis present

## 2023-01-30 DIAGNOSIS — I060 Rheumatic aortic stenosis: Secondary | ICD-10-CM | POA: Diagnosis present

## 2023-01-30 DIAGNOSIS — I10 Essential (primary) hypertension: Secondary | ICD-10-CM | POA: Diagnosis present

## 2023-01-30 DIAGNOSIS — Z8616 Personal history of COVID-19: Secondary | ICD-10-CM | POA: Diagnosis not present

## 2023-01-30 DIAGNOSIS — E0781 Sick-euthyroid syndrome: Secondary | ICD-10-CM | POA: Diagnosis present

## 2023-01-30 DIAGNOSIS — Z96612 Presence of left artificial shoulder joint: Secondary | ICD-10-CM | POA: Diagnosis present

## 2023-01-30 DIAGNOSIS — I129 Hypertensive chronic kidney disease with stage 1 through stage 4 chronic kidney disease, or unspecified chronic kidney disease: Secondary | ICD-10-CM | POA: Diagnosis present

## 2023-01-30 DIAGNOSIS — Z885 Allergy status to narcotic agent status: Secondary | ICD-10-CM

## 2023-01-30 DIAGNOSIS — E7800 Pure hypercholesterolemia, unspecified: Secondary | ICD-10-CM | POA: Diagnosis present

## 2023-01-30 DIAGNOSIS — Z152 Genetic susceptibility to obesity: Secondary | ICD-10-CM

## 2023-01-30 DIAGNOSIS — Z23 Encounter for immunization: Secondary | ICD-10-CM

## 2023-01-30 DIAGNOSIS — N179 Acute kidney failure, unspecified: Secondary | ICD-10-CM | POA: Diagnosis present

## 2023-01-30 DIAGNOSIS — E1122 Type 2 diabetes mellitus with diabetic chronic kidney disease: Secondary | ICD-10-CM | POA: Diagnosis present

## 2023-01-30 DIAGNOSIS — E119 Type 2 diabetes mellitus without complications: Secondary | ICD-10-CM

## 2023-01-30 DIAGNOSIS — I4891 Unspecified atrial fibrillation: Secondary | ICD-10-CM | POA: Diagnosis present

## 2023-01-30 DIAGNOSIS — N189 Chronic kidney disease, unspecified: Secondary | ICD-10-CM | POA: Diagnosis present

## 2023-01-30 DIAGNOSIS — J9601 Acute respiratory failure with hypoxia: Secondary | ICD-10-CM | POA: Diagnosis present

## 2023-01-30 DIAGNOSIS — R011 Cardiac murmur, unspecified: Secondary | ICD-10-CM | POA: Diagnosis present

## 2023-01-30 DIAGNOSIS — E8882 Obesity due to disruption of MC4R pathway: Secondary | ICD-10-CM | POA: Diagnosis present

## 2023-01-30 DIAGNOSIS — R0602 Shortness of breath: Secondary | ICD-10-CM | POA: Diagnosis present

## 2023-01-30 DIAGNOSIS — Z794 Long term (current) use of insulin: Secondary | ICD-10-CM | POA: Diagnosis not present

## 2023-01-30 DIAGNOSIS — T380X5A Adverse effect of glucocorticoids and synthetic analogues, initial encounter: Secondary | ICD-10-CM | POA: Diagnosis present

## 2023-01-30 DIAGNOSIS — M51369 Other intervertebral disc degeneration, lumbar region without mention of lumbar back pain or lower extremity pain: Secondary | ICD-10-CM | POA: Diagnosis present

## 2023-01-30 DIAGNOSIS — Z96651 Presence of right artificial knee joint: Secondary | ICD-10-CM | POA: Diagnosis present

## 2023-01-30 DIAGNOSIS — I4819 Other persistent atrial fibrillation: Secondary | ICD-10-CM | POA: Diagnosis present

## 2023-01-30 DIAGNOSIS — Z888 Allergy status to other drugs, medicaments and biological substances status: Secondary | ICD-10-CM

## 2023-01-30 DIAGNOSIS — Z90710 Acquired absence of both cervix and uterus: Secondary | ICD-10-CM

## 2023-01-30 DIAGNOSIS — Z7984 Long term (current) use of oral hypoglycemic drugs: Secondary | ICD-10-CM | POA: Diagnosis not present

## 2023-01-30 DIAGNOSIS — I482 Chronic atrial fibrillation, unspecified: Secondary | ICD-10-CM | POA: Diagnosis present

## 2023-01-30 DIAGNOSIS — Z884 Allergy status to anesthetic agent status: Secondary | ICD-10-CM

## 2023-01-30 DIAGNOSIS — M1990 Unspecified osteoarthritis, unspecified site: Secondary | ICD-10-CM | POA: Diagnosis present

## 2023-01-30 DIAGNOSIS — Z8701 Personal history of pneumonia (recurrent): Secondary | ICD-10-CM

## 2023-01-30 DIAGNOSIS — F419 Anxiety disorder, unspecified: Secondary | ICD-10-CM | POA: Diagnosis present

## 2023-01-30 DIAGNOSIS — Z803 Family history of malignant neoplasm of breast: Secondary | ICD-10-CM

## 2023-01-30 DIAGNOSIS — Z7982 Long term (current) use of aspirin: Secondary | ICD-10-CM

## 2023-01-30 LAB — PROTIME-INR
INR: 1.1 (ref 0.8–1.2)
Prothrombin Time: 14.3 s (ref 11.4–15.2)

## 2023-01-30 LAB — COMPREHENSIVE METABOLIC PANEL
ALT: 19 U/L (ref 0–44)
AST: 24 U/L (ref 15–41)
Albumin: 3.9 g/dL (ref 3.5–5.0)
Alkaline Phosphatase: 52 U/L (ref 38–126)
Anion gap: 13 (ref 5–15)
BUN: 20 mg/dL (ref 8–23)
CO2: 23 mmol/L (ref 22–32)
Calcium: 9.1 mg/dL (ref 8.9–10.3)
Chloride: 103 mmol/L (ref 98–111)
Creatinine, Ser: 1.16 mg/dL — ABNORMAL HIGH (ref 0.44–1.00)
GFR, Estimated: 46 mL/min — ABNORMAL LOW (ref >60)
Glucose, Bld: 195 mg/dL — ABNORMAL HIGH (ref 70–99)
Potassium: 4.2 mmol/L (ref 3.5–5.1)
Sodium: 139 mmol/L (ref 135–145)
Total Bilirubin: 1 mg/dL (ref 0.3–1.2)
Total Protein: 7.5 g/dL (ref 6.5–8.1)

## 2023-01-30 LAB — CBC
HCT: 43.8 % (ref 36.0–46.0)
Hemoglobin: 14.2 g/dL (ref 12.0–15.0)
MCH: 29 pg (ref 26.0–34.0)
MCHC: 32.4 g/dL (ref 30.0–36.0)
MCV: 89.4 fL (ref 80.0–100.0)
Platelets: 248 10*3/uL (ref 150–400)
RBC: 4.9 MIL/uL (ref 3.87–5.11)
RDW: 14.3 % (ref 11.5–15.5)
WBC: 10.3 10*3/uL (ref 4.0–10.5)
nRBC: 0 % (ref 0.0–0.2)

## 2023-01-30 LAB — BRAIN NATRIURETIC PEPTIDE: B Natriuretic Peptide: 289.9 pg/mL — ABNORMAL HIGH (ref 0.0–100.0)

## 2023-01-30 LAB — APTT: aPTT: 30 s (ref 24–36)

## 2023-01-30 LAB — GLUCOSE, CAPILLARY
Glucose-Capillary: 312 mg/dL — ABNORMAL HIGH (ref 70–99)
Glucose-Capillary: 448 mg/dL — ABNORMAL HIGH (ref 70–99)

## 2023-01-30 LAB — TROPONIN I (HIGH SENSITIVITY)
Troponin I (High Sensitivity): 8 ng/L (ref <18)
Troponin I (High Sensitivity): 7 ng/L (ref <18)
Troponin I (High Sensitivity): 7 ng/L (ref <18)

## 2023-01-30 LAB — TSH: TSH: 4.844 u[IU]/mL — ABNORMAL HIGH (ref 0.350–4.500)

## 2023-01-30 LAB — PROCALCITONIN: Procalcitonin: <0.1 ng/mL

## 2023-01-30 MED ORDER — INSULIN ASPART 100 UNIT/ML IJ SOLN
0.0000 [IU] | Freq: Every day | INTRAMUSCULAR | Status: DC
  Administered 2023-01-31: 5 [IU] via SUBCUTANEOUS
  Administered 2023-02-01 – 2023-02-02 (×2): 2 [IU] via SUBCUTANEOUS
  Filled 2023-01-30 (×4): qty 1

## 2023-01-30 MED ORDER — ENOXAPARIN SODIUM 40 MG/0.4ML IJ SOSY: 40.0000 mg | PREFILLED_SYRINGE | INTRAMUSCULAR | Status: DC

## 2023-01-30 MED ORDER — METHYLPREDNISOLONE SODIUM SUCC 40 MG IJ SOLR
40.0000 mg | Freq: Every day | INTRAMUSCULAR | Status: DC
  Administered 2023-01-31: 40 mg via INTRAVENOUS
  Filled 2023-01-30: qty 1

## 2023-01-30 MED ORDER — GUAIFENESIN ER 600 MG PO TB12
600.0000 mg | ORAL_TABLET | Freq: Two times a day (BID) | ORAL | Status: DC
  Administered 2023-01-30 – 2023-02-01 (×5): 600 mg via ORAL
  Filled 2023-01-30 (×5): qty 1

## 2023-01-30 MED ORDER — ALPRAZOLAM 0.5 MG PO TABS
0.5000 mg | ORAL_TABLET | Freq: Every evening | ORAL | Status: DC | PRN
  Administered 2023-01-30 – 2023-02-03 (×5): 0.5 mg via ORAL
  Filled 2023-01-30 (×5): qty 1

## 2023-01-30 MED ORDER — POLYVINYL ALCOHOL 1.4 % OP SOLN
2.0000 [drp] | OPHTHALMIC | Status: DC | PRN
  Administered 2023-01-30: 2 [drp] via OPHTHALMIC
  Filled 2023-01-30: qty 15

## 2023-01-30 MED ORDER — INSULIN ASPART 100 UNIT/ML IJ SOLN
0.0000 [IU] | Freq: Three times a day (TID) | INTRAMUSCULAR | Status: DC
  Administered 2023-01-30: 7 [IU] via SUBCUTANEOUS
  Administered 2023-01-31: 15 [IU] via SUBCUTANEOUS
  Administered 2023-01-31: 9 [IU] via SUBCUTANEOUS
  Filled 2023-01-30 (×3): qty 1

## 2023-01-30 MED ORDER — AMLODIPINE BESYLATE 10 MG PO TABS: 10.0000 mg | ORAL_TABLET | Freq: Every day | ORAL | Status: DC

## 2023-01-30 MED ORDER — DULOXETINE HCL 30 MG PO CPEP
60.0000 mg | ORAL_CAPSULE | Freq: Every day | ORAL | Status: DC
  Administered 2023-01-31: 60 mg via ORAL
  Filled 2023-01-30: qty 2

## 2023-01-30 MED ORDER — METHYLPREDNISOLONE SODIUM SUCC 40 MG IJ SOLR: 40.0000 mg | Freq: Two times a day (BID) | INTRAMUSCULAR | Status: DC

## 2023-01-30 MED ORDER — INSULIN GLARGINE-YFGN 100 UNIT/ML ~~LOC~~ SOLN
20.0000 [IU] | Freq: Every day | SUBCUTANEOUS | Status: DC
  Administered 2023-01-30: 20 [IU] via SUBCUTANEOUS
  Filled 2023-01-30 (×2): qty 0.2

## 2023-01-30 MED ORDER — DILTIAZEM HCL-DEXTROSE 125-5 MG/125ML-% IV SOLN (PREMIX)
5.0000 mg/h | INTRAVENOUS | Status: DC
  Administered 2023-01-30 – 2023-01-31 (×2): 5 mg/h via INTRAVENOUS
  Filled 2023-01-30 (×2): qty 125

## 2023-01-30 MED ORDER — ALBUTEROL SULFATE (2.5 MG/3ML) 0.083% IN NEBU: 2.5000 mg | INHALATION_SOLUTION | Freq: Four times a day (QID) | RESPIRATORY_TRACT | Status: DC | PRN

## 2023-01-30 MED ORDER — ACETAMINOPHEN 325 MG PO TABS
650.0000 mg | ORAL_TABLET | Freq: Four times a day (QID) | ORAL | Status: DC | PRN
  Administered 2023-01-30 – 2023-02-03 (×6): 650 mg via ORAL
  Filled 2023-01-30 (×6): qty 2

## 2023-01-30 MED ORDER — DILTIAZEM LOAD VIA INFUSION
15.0000 mg | Freq: Once | INTRAVENOUS | Status: AC
  Administered 2023-01-30: 15 mg via INTRAVENOUS
  Filled 2023-01-30: qty 15

## 2023-01-30 MED ORDER — FUROSEMIDE 10 MG/ML IJ SOLN
40.0000 mg | Freq: Once | INTRAMUSCULAR | Status: AC
  Administered 2023-01-30: 40 mg via INTRAVENOUS
  Filled 2023-01-30: qty 4

## 2023-01-30 MED ORDER — ACETAMINOPHEN 650 MG RE SUPP: 650.0000 mg | Freq: Four times a day (QID) | RECTAL | Status: DC | PRN

## 2023-01-30 MED ORDER — INSULIN ASPART 100 UNIT/ML IJ SOLN
10.0000 [IU] | Freq: Once | INTRAMUSCULAR | Status: AC
  Administered 2023-01-30: 10 [IU] via SUBCUTANEOUS
  Filled 2023-01-30: qty 1

## 2023-01-30 MED ORDER — IOHEXOL 350 MG/ML SOLN
75.0000 mL | Freq: Once | INTRAVENOUS | Status: AC | PRN
  Administered 2023-01-30: 75 mL via INTRAVENOUS

## 2023-01-30 MED ORDER — PREDNISONE 20 MG PO TABS
40.0000 mg | ORAL_TABLET | Freq: Once | ORAL | Status: DC
  Filled 2023-01-30: qty 2

## 2023-01-30 MED ORDER — ENOXAPARIN SODIUM 100 MG/ML IJ SOSY
1.0000 mg/kg | PREFILLED_SYRINGE | Freq: Two times a day (BID) | INTRAMUSCULAR | Status: DC
  Administered 2023-01-30: 82.5 mg via SUBCUTANEOUS
  Filled 2023-01-30 (×3): qty 1

## 2023-01-30 MED ORDER — METHYLPREDNISOLONE SODIUM SUCC 125 MG IJ SOLR
125.0000 mg | INTRAMUSCULAR | Status: AC
  Administered 2023-01-30: 125 mg via INTRAVENOUS
  Filled 2023-01-30: qty 2

## 2023-01-30 MED ORDER — IPRATROPIUM-ALBUTEROL 0.5-2.5 (3) MG/3ML IN SOLN
3.0000 mL | Freq: Once | RESPIRATORY_TRACT | Status: AC
  Administered 2023-01-30: 3 mL via RESPIRATORY_TRACT
  Filled 2023-01-30: qty 3

## 2023-01-30 MED ORDER — HYDRALAZINE HCL 20 MG/ML IJ SOLN: 10.0000 mg | Freq: Four times a day (QID) | INTRAMUSCULAR | Status: DC | PRN

## 2023-01-30 MED ORDER — ASPIRIN 81 MG PO TBEC: 81.0000 mg | DELAYED_RELEASE_TABLET | ORAL | Status: DC

## 2023-01-30 NOTE — Progress Notes (Signed)
SLP Cancellation Note  Patient Details Name: Katarina Riebe MRN: [id number] DOB: Feb 02, 1936   Cancelled treatment:       Reason Eval/Treat Not Completed: Other (comment) (SLP consult received and appreciated. Pt currently in ED. No H&P in chart. Will defer clinical swallowing evaluation pending further medical work up.)   Woodroe Chen 01/30/2023, 1:48 PM

## 2023-01-30 NOTE — ED Notes (Signed)
Pt up to restroom from EMS stretcher, upon return to bed pt O2 at 83%.

## 2023-01-30 NOTE — ED Notes (Signed)
Pt ambulated to the restroom with SBA. Steady gait. Pt back to bed O2 sats dropped to 85%. Pt back up to 91% on 2L.

## 2023-01-30 NOTE — ED Triage Notes (Addendum)
Pt BIB [facility] for SOB. EMS reports pt O2 in the 60's at home, fire department placed on NRB, O2 up to 97%. EMS placed pt on [place] with O2 sats at 93%. EMS reports lung sounds clear, pt reports no pain only hard to breathe on exertion. EMS vitals: BP 133/104, 30rr, CO2 28, temp 98.4, CBG 224 is diabetic. Pt states she had a virus 6 weeks ago and was diagnosed with Covid 3 weeks ago and since then has felt weak and SOB.

## 2023-01-30 NOTE — Consult Note (Signed)
Pharmacy Consult Note - Anticoagulation  Pharmacy Consult for enoxaparin Indication: atrial fibrillation  PATIENT MEASUREMENTS: Height: 5\' 4"  (162.6 cm) Weight: 83.5 kg (184 lb) IBW/kg (Calculated) : 54.7 HEPARIN DW (KG): 72.9  VITAL SIGNS: Temp: 98.2 F (36.8 C) (10/09 1618) Temp Source: Oral (10/09 1243) BP: 129/100 (10/09 1618) Pulse Rate: 126 (10/09 1618)  Recent Labs    01/30/23 0837 01/30/23 1506  HGB 14.2  --   HCT 43.8  --   PLT 248  --   CREATININE 1.16*  --   TROPONINIHS 8 7    Estimated Creatinine Clearance: 37.1 mL/min (A) (by C-G formula based on SCr of 1.16 mg/dL (H)).  PAST MEDICAL HISTORY: Past Medical History:  Diagnosis Date   Anemia    Anxiety    Aortic stenosis, mild    Arthritis    CKD (chronic kidney disease), stage III (HCC)    Complication of anesthesia    Propofol anaphylaxis   Cortical cataract    DDD (degenerative disc disease), lumbar    Depression    Dyspnea    GERD (gastroesophageal reflux disease)    Headache    Heart murmur    History of 2019 novel coronavirus disease (COVID-19) 12/01/2018   HLD (hyperlipidemia)    Hypertension    Pneumonia    Schatzki's ring    Seasonal allergies    T2DM (type 2 diabetes mellitus) (HCC)    Valvular insufficiency     ASSESSMENT: 87 y.o. female with PMH including obesity is presenting with atrial fibrillation. Initially presented with SOB and then provider was called by RN due to patient experiencing episodes of Afib with RVR. HR currently 126. Patient is not on chronic anticoagulation per chart review. Pharmacy has been consulted to initiate and manage subcutaneous enoxaparin.  Pertinent medications: No chronic AC PTA per chart review  Goal(s) of therapy: Heparin level 0.3 - 0.7 units/mL Monitor platelets by anticoagulation protocol: Yes   Baseline anticoagulation labs: Recent Labs    01/30/23 0837  HGB 14.2  PLT 248       PLAN: Initiate enoxaparin 1 mg/kg subQ q12H Will  order Scr for tomorrow CBC at least every 72 hours for patients on enoxaparin   Will M. Dareen Piano, PharmD Clinical Pharmacist 01/30/2023 5:01 PM

## 2023-01-30 NOTE — Significant Event (Signed)
Called by RN because patient complained of chest pain and rhythm on the monitor read A-fib EKG earlier in the day had shown sinus tachycardia in 130s. Repeat EKG at this time showed a flutter with variable block in 130s.  I evaluated the patient.  Chest pain has resolved Troponin was normal this morning and normal now Patient reports intermittent episodes of palpitations in the last several days.  Close probably were episodes of paroxysmal A-fib with RVR. Her heart rate has been persistently elevated for the whole day today.  Started Cardizem bolus and drip Lovenox full dose Stop aspirin and amlodipine Transfer to progressive unit. Prior to discharge, patient wants to discuss with her cardiologist Dr. Darrold Junker who seems to be on-call tomorrow.

## 2023-01-30 NOTE — ED Provider Notes (Signed)
[facility] Provider Note    Event Date/Time   First MD Initiated Contact with Patient 01/30/23 0801     (approximate)   History   Shortness of Breath   HPI  Rache Klimaszewski is a 87 y.o. female chronic kidney disease, diabetes, patient reports recent COVID infection a few weeks ago   Attempted to review [employer] records, not loading at present  Patient reports she has been slowly worsening for about a month.  She saw Dr. Judithann Sheen and was diagnosed with "too much fluid" which improved, but then about 3 weeks ago developed COVID.  Fevers chills cough have improved.  However she has had increasing shortness of breath especially when she tries to exert herself or walk.  She is not swollen in her legs and has not gained weight.  She is not coughing.  She feels short of breath mostly when she exerts herself.  There is no pain including no chest pain  She has intermittently noticed that her heart will be racing for the last several days off and on  Physical Exam   Triage Vital Signs: ED Triage Vitals  Encounter Vitals Group     BP 01/30/23 0810 (!) 141/102     Systolic BP Percentile --      Diastolic BP Percentile --      Pulse Rate 01/30/23 0804 (!) 134     Resp 01/30/23 0804 (!) 25     Temp 01/30/23 0810 98.6 F (37 C)     Temp Source 01/30/23 0810 Oral     SpO2 01/30/23 0804 96 %     Weight 01/30/23 0805 184 lb (83.5 kg)     Height 01/30/23 0805 5\' 4"  (1.626 m)     Head Circumference --      Peak Flow --      Pain Score 01/30/23 0805 0     Pain Loc --      Pain Education --      Exclude from Growth Chart --     Most recent vital signs: Vitals:   01/30/23 1928 01/30/23 2000  BP: (!) 141/73 116/86  Pulse: (!) 123   Resp: 20 18  Temp: 98 F (36.7 C)   SpO2: 95%      General: Awake, no distress.  CV:  Good peripheral perfusion.  Tachycardia, questionable slight diastolic murmur Resp:  Normal effort.  Normal work of breathing except  very mild tachypnea with slight accessory muscle use.  Her lung sounds are clear bilaterally however.  She speaks in full sentences on 2 L nasal cannula.  She has noted to have mild hypoxia when walking back and forth to the bathroom oxygen saturation into the mid 80s but rebounded to normal after sitting in bed Abd:  No distention.  Soft nontender nondistended Other:  Lower extremity edema venous cords or congestion no calf tenderness   ED Results / Procedures / Treatments   Labs (all labs ordered are listed, but only abnormal results are displayed) Labs Reviewed  COMPREHENSIVE METABOLIC PANEL - Abnormal; Notable for the following components:      Result Value   Glucose, Bld 195 (*)    Creatinine, Ser 1.16 (*)    GFR, Estimated 46 (*)    All other components within normal limits  BRAIN NATRIURETIC PEPTIDE - Abnormal; Notable for the following components:   B Natriuretic Peptide 289.9 (*)    All other components within normal limits  TSH - Abnormal; Notable for  the following components:   TSH 4.844 (*)    All other components within normal limits  GLUCOSE, CAPILLARY - Abnormal; Notable for the following components:   Glucose-Capillary 312 (*)    All other components within normal limits  GLUCOSE, CAPILLARY - Abnormal; Notable for the following components:   Glucose-Capillary 448 (*)    All other components within normal limits  CBC  PROCALCITONIN  APTT  PROTIME-INR  HEMOGLOBIN A1C  BASIC METABOLIC PANEL  CBC  BRAIN NATRIURETIC PEPTIDE  T4, FREE  TROPONIN I (HIGH SENSITIVITY)  TROPONIN I (HIGH SENSITIVITY)  TROPONIN I (HIGH SENSITIVITY)     EKG  He interpreted by me at 835 heart rate 130 QRS 80 QTc 430 Suspect atrial fibrillation with rapid ventricular response, though there are some areas more I question if P waves are present.  Overall though, suspect A-fib RVR.   RADIOLOGY  CT Angio Chest PE W and/or Wo Contrast  Result Date: 01/30/2023 CLINICAL DATA:  Pulmonary  embolism (PE) suspected, high prob. Dyspnea. Shortness of breath. EXAM: CT ANGIOGRAPHY CHEST WITH CONTRAST TECHNIQUE: Multidetector CT imaging of the chest was performed using the standard protocol during bolus administration of intravenous contrast. Multiplanar CT image reconstructions and MIPs were obtained to evaluate the vascular anatomy. RADIATION DOSE REDUCTION: This exam was performed according to the departmental dose-optimization program which includes automated exposure control, adjustment of the mA and/or kV according to patient size and/or use of iterative reconstruction technique. CONTRAST:  75mL OMNIPAQUE IOHEXOL 350 MG/ML SOLN COMPARISON:  None Available. FINDINGS: Cardiovascular: No evidence of embolism to the proximal subsegmental pulmonary artery level. Mild cardiomegaly. Physiological pericardial effusion. No aortic aneurysm. There are coronary artery calcifications, in keeping with coronary artery disease. There are also mild peripheral atherosclerotic vascular calcifications of thoracic aorta and its major branches. Mediastinum/Nodes: Visualized thyroid gland appears grossly unremarkable. No solid / cystic mediastinal masses. The esophagus is nondistended precluding optimal assessment. There are few mildly prominent mediastinal and hilar lymph nodes, which do not meet the size criteria for lymphadenopathy and though indeterminate most likely benign in etiology. No axillary lymphadenopathy by size criteria. Lungs/Pleura: The central tracheo-bronchial tree is patent. There is mild, smooth, circumferential thickening of the segmental and subsegmental bronchial walls, throughout bilateral lungs, which is nonspecific. Findings are most commonly seen with bronchitis or reactive airway disease, such as asthma. There also occlusive and nonocclusive filling defects mainly in the bilateral lower lobe subsegmental bronchi, likely due to mucous secretions or aspiration. There are patchy areas of linear,  plate-like atelectasis and/or scarring throughout bilateral lungs. No mass or consolidation. No pleural effusion or pneumothorax. No suspicious lung nodules. Upper Abdomen: There is reflux of contrast in the intrahepatic IVC and central intrahepatic veins, favoring right heart strain/failure. Visualized upper abdominal viscera within normal limits. Musculoskeletal: The visualized soft tissues of the chest wall are grossly unremarkable. No suspicious osseous lesions. There are mild multilevel degenerative changes in the visualized spine. Note is made of left shoulder arthroplasty. Review of the MIP images confirms the above findings. IMPRESSION: 1. No evidence of pulmonary embolism to the proximal subsegmental pulmonary artery level. 2. Mild, smooth, circumferential thickening of the segmental and subsegmental bronchial walls, throughout bilateral lungs, which is nonspecific. Findings are most commonly seen with bronchitis or reactive airway disease, such as asthma. 3. Occlusive and nonocclusive filling defects mainly in the bilateral lower lobe subsegmental bronchi, likely due to mucous secretions or aspiration. 4. Reflux of contrast in the intrahepatic IVC and central intrahepatic veins, favoring  right heart strain/failure. 5. Multiple other nonacute observations, as described above. Aortic Atherosclerosis (ICD10-I70.0). Electronically Signed   By: Jules Schick M.D.   On: 01/30/2023 11:17      PROCEDURES:  Critical Care performed: Yes, see critical care procedure note(s)  CRITICAL CARE Performed by: Sharyn Creamer   Total critical care time: 30 minutes  Critical care time was exclusive of separately billable procedures and treating other patients.  Critical care was necessary to treat or prevent imminent or life-threatening deterioration.  Critical care was time spent personally by me on the following activities: development of treatment plan with patient and/or surrogate as well as nursing,  discussions with consultants, evaluation of patient's response to treatment, examination of patient, obtaining history from patient or surrogate, ordering and performing treatments and interventions, ordering and review of laboratory studies, ordering and review of radiographic studies, pulse oximetry and re-evaluation of patient's condition.   Procedures   MEDICATIONS ORDERED IN ED: Medications  insulin aspart (novoLOG) injection 0-9 Units (7 Units Subcutaneous Given 01/30/23 1712)  insulin aspart (novoLOG) injection 0-5 Units ( Subcutaneous Not Given 01/30/23 2108)  acetaminophen (TYLENOL) tablet 650 mg (650 mg Oral Given 01/30/23 2133)    Or  acetaminophen (TYLENOL) suppository 650 mg ( Rectal See Alternative 01/30/23 2133)  albuterol (PROVENTIL) (2.5 MG/3ML) 0.083% nebulizer solution 2.5 mg (has no administration in time range)  hydrALAZINE (APRESOLINE) injection 10 mg (has no administration in time range)  guaiFENesin (MUCINEX) 12 hr tablet 600 mg (600 mg Oral Given 01/30/23 2133)  methylPREDNISolone sodium succinate (SOLU-MEDROL) 40 mg/mL injection 40 mg (has no administration in time range)  ALPRAZolam (XANAX) tablet 0.5 mg (0.5 mg Oral Given 01/30/23 2133)  DULoxetine (CYMBALTA) DR capsule 60 mg (has no administration in time range)  insulin glargine-yfgn (SEMGLEE) injection 20 Units (has no administration in time range)  enoxaparin (LOVENOX) injection 82.5 mg (82.5 mg Subcutaneous Given 01/30/23 2100)  diltiazem (CARDIZEM) 125 mg in dextrose 5% 125 mL (1 mg/mL) infusion (10 mg/hr Intravenous Rate/Dose Change 01/30/23 1925)  iohexol (OMNIPAQUE) 350 MG/ML injection 75 mL (75 mLs Intravenous Contrast Given 01/30/23 0944)  ipratropium-albuterol (DUONEB) 0.5-2.5 (3) MG/3ML nebulizer solution 3 mL (3 mLs Nebulization Given 01/30/23 1225)  methylPREDNISolone sodium succinate (SOLU-MEDROL) 125 mg/2 mL injection 125 mg (125 mg Intravenous Given 01/30/23 1233)  furosemide (LASIX) injection 40 mg (40 mg  Intravenous Given 01/30/23 1350)  diltiazem (CARDIZEM) 1 mg/mL load via infusion 15 mg (15 mg Intravenous Bolus from Bag 01/30/23 1827)  insulin aspart (novoLOG) injection 10 Units (10 Units Subcutaneous Given 01/30/23 2134)     IMPRESSION / MDM / ASSESSMENT AND PLAN / ED COURSE  I reviewed the triage vital signs and the nursing notes.                              Differential diagnosis includes, but is not limited to, atrial dysrhythmia, volume overload, pneumonia, CHF, pulmonary embolism, ACS etc.  No associated chest pains having some palpitations though.  She is quite tachycardic recent COVID infection lung sounds primarily clear on examination.  Will proceed with CT imaging of the chest to further evaluate as to causation  Patient's presentation is most consistent with acute complicated illness / injury requiring diagnostic workup.   The patient is on the cardiac monitor to evaluate for evidence of arrhythmia and/or significant heart rate changes.  ----------------------------------------- 12:08 PM on 01/30/2023 ----------------------------------------- Patient reports to me after we discussed her results  that she just finished a 7-day course of Augmentin.  She is resting comfortably at this time still 2 L oxygen requirement.  Looking through her CT imaging and her workup, there is no clear indication of ongoing infection her procalcitonin is low white count normal afebrile.  Her work of breathing is still just slightly increased and a mild oxygen requirement no pulmonary embolism and I now question if there may be mucous plugging that could be responsible for some of her hypoxia as well.  Will give a DuoNeb and started on prednisone.  Discussed with the patient given the ongoing mild oxygen requirement she will require admission to the hospital and further treatment is anticipated in internal medicine service for diagnostic workup etc.  The patient is agreeable with this plan  Consulted  with and patient accepted to hospitalist by Dr. Pola Corn     FINAL CLINICAL IMPRESSION(S) / ED DIAGNOSES   Final diagnoses:  Hypoxia  Acute hypoxemic respiratory failure (HCC)     Rx / DC Orders   ED Discharge Orders     None        Note:  This document was prepared using Dragon voice recognition software and may include unintentional dictation errors.   Sharyn Creamer, MD 01/30/23 2135

## 2023-01-30 NOTE — Progress Notes (Signed)
PT Cancellation Note  Patient Details Name: Quatisha Zylka MRN: [id number] DOB: 1935/09/11   Cancelled Treatment:    Reason Eval/Treat Not Completed: Medical issues which prohibited therapy PT consult received, patient has HR in 120s currently. Will evaluate as appropriate at later date/time.   Azel Gumina 01/30/2023, 3:40 PM

## 2023-01-30 NOTE — ED Notes (Signed)
Pt returned from CT °

## 2023-01-30 NOTE — H&P (Addendum)
[facility] History and Physical  Samantha Freeman ZOX:[id number] DOB: 1936-02-19 DOA: 01/30/2023 PCP: Marguarite Arbour, MD  Presented from: Home Chief Complaint: Worsening shortness of breath  History of Present Illness: Samantha Freeman is a 87 y.o. female with PMH significant for obesity, DM2, HTN, HLD, sinus bradycardia, CKD, GERD/Schatzki's ring, anemia, anxiety/depression, vascular insufficiency, COVID pneumonia. Patient presented to the ED today with complaint of progressively worsening shortness of breath for last several days. Patient reports that she was very sick with COVID in 2020, was hospitalized but did not require intubation.  She thinks he never really recovered after that and always had some limitation in her exertion.  No formal history of CHF but used to be on Lasix in the past for bilateral pedal edema as prescribed by her cardiologist Dr. Darrold Junker.  Lately not on Lasix. 2 months ago, she developed difficulty breathing, tested positive for COVID, improved with supportive care.  3 weeks later, she was short of breath with another viral infection and again improved with supportive care.  3 weeks after that, she had rebound of respiratory symptoms and was treated with a course of amoxicillin and prednisone which she completed.  After the patient on course, patient felt that her symptoms have returned again.  For the last few days, patient is short of breath even on minimal exertion to the bathroom.  No fever.  Has cough with whitish phlegm.  Feels palpitation at times.  In the ED, patient was afebrile, heart rate 134, O2 sat 83% on room air required 4 L oxygen by nasal cannula.  Blood pressure 130s Labs with unremarkable CBC, unremarkable BMP except for glucose 195 and creatinine 1.16, BNP elevated to 290, procalcitonin level not elevated, TSH 4.8 EKG with sinus tachycardia at 131 BPM, QTc 431 ms CT angio chest findings: 1. No evidence of PE 2. Mild, smooth, circumferential  thickening of the segmental and subsegmental bronchial walls, throughout bilateral lungs, which is nonspecific. Findings are most commonly seen with bronchitis or reactive airway disease, such as asthma. 3. Occlusive and nonocclusive filling defects mainly in the bilateral lower lobe subsegmental bronchi, likely due to mucous secretions or aspiration. 4. Reflux of contrast in the intrahepatic IVC and central intrahepatic veins, favoring right heart strain/failure. 5. Multiple other nonacute observations, as described above.  Patient was given IV Solu-Medrol Hospitalist service was consulted for further workup and management  At the time of my evaluation, patient was propped up in bed.  On 4 L oxygen by nasal cannula.  Not in distress at rest.  Her cousin sister was at bedside.  History reviewed and detailed as above. Patient confirmed DNR/DNI status  Review of Systems:  All systems were reviewed and were negative unless otherwise mentioned in the HPI   Past medical history: Past Medical History:  Diagnosis Date   Anemia    Anxiety    Aortic stenosis, mild    Arthritis    CKD (chronic kidney disease), stage III (HCC)    Complication of anesthesia    Propofol anaphylaxis   Cortical cataract    DDD (degenerative disc disease), lumbar    Depression    Dyspnea    GERD (gastroesophageal reflux disease)    Headache    Heart murmur    History of 2019 novel coronavirus disease (COVID-19) 12/01/2018   HLD (hyperlipidemia)    Hypertension    Pneumonia    Schatzki's ring    Seasonal allergies    T2DM (type 2 diabetes mellitus) (HCC)  Valvular insufficiency     Past surgical history: Past Surgical History:  Procedure Laterality Date   ABDOMINAL HYSTERECTOMY     APPENDECTOMY     BICEPT TENODESIS Right 05/13/2018   Procedure: BICEPS TENODESIS;  Surgeon: Christena Flake, MD;  Location: [facility] ORS;  Service: Orthopedics;  Laterality: Right;   CARDIAC CATHETERIZATION     COLONOSCOPY      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   EYE SURGERY Right 09/2013   Corneal transplant   JOINT REPLACEMENT Right 2015   knee   REVERSE SHOULDER ARTHROPLASTY Left 10/13/2020   Procedure: REVERSE SHOULDER ARTHROPLASTY;  Surgeon: Christena Flake, MD;  Location: [facility] OR[facility];  Service: Orthopedics;  Laterality: Left;   SHOULDER ARTHROSCOPY WITH ROTATOR CUFF REPAIR Right 05/13/2018   Procedure: SHOULDER ARTHROSCOPY WITH DEBRIDEMENT, DECOMPRESSION AND ROTATOR CUFF REPAIR;  Surgeon: Christena Flake, MD;  Location: [facility] OR[facility];  Service: Orthopedics;  Laterality: Right;   TONSILLECTOMY      Social History:  reports that she has never smoked. She has never used smokeless tobacco. She reports that she does not drink alcohol and does not use drugs.  Allergies:  Allergies  Allergen Reactions   Propofol Anaphylaxis   Zolpidem Other (See Comments)    Hallucinations   Codeine Nausea Only   Niacin Rash   Propofol, Zolpidem, Codeine, and Niacin   Family history:  Family History  Problem Relation Age of Onset   Breast cancer Paternal Aunt      Physical Exam: Vitals:   01/30/23 0930 01/30/23 1000 01/30/23 1030 01/30/23 1243  BP: 116/82 119/73 119/86 (!) 132/96  Pulse: (!) 121 (!) 111 (!) 105 (!) 113  Resp: 20  18 18   Temp:    97.6 F (36.4 C)  TempSrc:    Oral  SpO2: 93% 95% 95% 95%  Weight:      Height:       Wt Readings from Last 3 Encounters:  01/30/23 83.5 kg  10/13/20 79.3 kg  10/05/20 79.3 kg   Body mass index is 31.58 kg/m.  General exam: Pleasant, elderly Caucasian female.  Not in distress at rest Skin: No rashes, lesions or ulcers. HEENT: Atraumatic, normocephalic, no obvious bleeding Lungs: Bilateral crackles present.  No wheezing CVS: Regular rate and rhythm, no murmur GI/Abd soft, nontender, nondistended, bowel sound present CNS: Alert, awake, oriented x 3 Psychiatry: Mood appropriate Extremities: Trace bilateral pedal  edema   ------------------------------------------------------------------------------------------------------ Assessment/Plan: Principal Problem:   Acute respiratory failure (HCC) Active Problems:   CKD (chronic kidney disease)   DM (diabetes mellitus) (HCC)   HTN (hypertension)  Acute respiratory failure with hypoxia Presented with several days of progressively worsening shortness of breath, not responding to Augmentin and prednisone as an outpatient Patient reports her symptoms started this time after her diagnosis of COVID 2 months ago. Noted to be hypoxic on ambulation requiring 4 L oxygen. On exam has bilateral crackles, no wheezing No fever, WBC count normal, procalcitonin level normal, elevated BNP CT chest findings as above suggestive of acute bronchitis, mucous plugs.  However also mentions right heart strain/failure. At this time, patient's symptoms could be secondary to a combination of reactive airway disease as well as CHF. See management of individual issues below Wean down supplemental oxygen.  Check ambulatory oxygen requirement.  Does not use oxygen at home.  Acute bronchitis Symptoms started after COVID 3 weeks ago. CT chest with findings of bronchitis and mucous plugs.  Unsure if she has any component of  chronic bronchitis secondary to severe COVID infection in 2020. Given 1 dose of IV Solu-Medrol in the ED.  I have ordered for Solu-Medrol IV 40 mg daily starting tomorrow. Also ordered for IS, flutter valve, bronchodilators as needed, Mucinex  Possible CHF exacerbation Although no prior h/o of CHF, patient reports she used to be on Lasix in the past for pedal edema. Has tachycardia, bilateral crackles on auscultation and BNP is elevated CT chest shows reflux of contrast in the intrahepatic IVC and central intrahepatic veins favoring right heart strain/failure. I ordered for 1 dose of Lasix IV 40 mg Update echocardiogram Follows up with cardiologist Dr.  Darrold Junker  Essential hypertension Continue amlodipine.  Monitor blood pressure on diuretics  Type 2 diabetes mellitus A1c 9 in 2020.  Update A1c PTA meds-Lantus, Premeal insulin, glimepiride Start Lantus 20 units tonight Start SSI/Accu-Cheks  HLD Start fenofibrate  Abnormal TSH TSH slightly elevated to 4.8.  Obtain free T4  CKD Follow-up creatinine level while on diuretics Recent Labs    01/30/23 0837  BUN 20  CREATININE 1.16*   GERD/Schatzki's ring PPI  anxiety/depression Cymbalta, as needed Ativan  Home med list has not been obtained/updated by [facility] at the time of admission.  Please double check admission reconciliation.    Mobility: Feels weak.  PT eval ordered  Goals of care   Code Status: Limited: Do not attempt resuscitation (DNR) -DNR-LIMITED -Do Not Intubate/DNI .  Confirmed with patient   DVT prophylaxis:  enoxaparin (LOVENOX) injection 40 mg Start: 01/30/23 2200   Antimicrobials: None Fluid: None Consultants: None Family Communication: Cousin sister at bedside  Dispo: The patient is from: Home              Anticipated d/c is to: Home in 2 to 3 days  Diet: Diet Order             Diet heart healthy/carb modified Room service appropriate? Yes; Fluid consistency: Thin  Diet effective now                    ------------------------------------------------------------------------------------- Severity of Illness: The appropriate patient status for this patient is INPATIENT. Inpatient status is judged to be reasonable and necessary in order to provide the required intensity of service to ensure the patient's safety. The patient's presenting symptoms, physical exam findings, and initial radiographic and laboratory data in the context of their chronic comorbidities is felt to place them at high risk for further clinical deterioration. Furthermore, it is not anticipated that the patient will be medically stable for discharge from the hospital  within 2 midnights of admission.   * I certify that at the point of admission it is my clinical judgment that the patient will require inpatient hospital care spanning beyond 2 midnights from the point of admission due to high intensity of service, high risk for further deterioration and high frequency of surveillance required.* ------------------------------------------------------------------------------------- Home Meds: Prior to Admission medications   Medication Sig Start Date End Date Taking? Authorizing Provider  ALPRAZolam Prudy Feeler) 0.5 MG tablet Take 0.5 mg by mouth at bedtime as needed for sleep.  02/04/18   [provider]  amLODipine (NORVASC) 10 MG tablet Take 1 tablet (10 mg total) by mouth daily. 12/11/18   Leroy Sea, MD  aspirin EC 81 MG tablet Take 81 mg by mouth every other day.    [provider]  blood glucose meter kit and supplies KIT Dispense based on patient and insurance preference. Use up to four times  daily as directed. (FOR ICD-9 250.00, 250.01). For QAC - HS accuchecks. 12/11/18   Leroy Sea, MD  Calcium Carb-Cholecalciferol (CALCIUM 600/VITAMIN D3 PO) Take 1 tablet by mouth daily.    [provider]  cholecalciferol (VITAMIN D3) 25 MCG (1000 UT) tablet Take 1,000 Units by mouth daily.    [provider]  DULoxetine (CYMBALTA) 30 MG capsule Take 60 mg by mouth daily.    [provider]  enalapril (VASOTEC) 20 MG tablet Take 20 mg by mouth 2 (two) times daily.    [provider]  enoxaparin (LOVENOX) 40 MG/0.4ML injection Inject 0.4 mLs (40 mg total) into the skin daily for 14 days. 10/14/20 10/28/20  Dedra Skeens, PA-C  fenofibrate (TRICOR) 145 MG tablet Take 145 mg by mouth daily.    [provider]  ferrous sulfate 325 (65 FE) MG tablet Take 325 mg by mouth daily with breakfast.    [provider]  gabapentin (NEURONTIN) 100 MG capsule Take 200 mg by mouth 3 (three) times daily.     [provider]  glimepiride (AMARYL) 4 MG tablet Take 4 mg by mouth 2 (two) times daily.    [provider]  glucose blood (FREESTYLE LITE) test strip For glucose testing every before meals at bedtime. Diagnosis E 11.65  Can substitute to any accepted brand 12/11/18   Leroy Sea, MD  insulin aspart (NOVOLOG) 100 UNIT/ML injection Can switch to any approved brand if needed.  Before each meal 3 times a day , 140-199 - 2 units, 200-250 - 4 units, 251-299 - 8 units,  300-349 - 12 units,  350 or above 14 units.  Insulin PEN if approved, provide syringes and needles if needed. Patient not taking: No sig reported 12/11/18   Leroy Sea, MD  insulin glargine (LANTUS) 100 UNIT/ML injection Inject 0.25 mLs (25 Units total) into the skin at bedtime. Dispense insulin pen if approved, if not dispense as needed syringes and needles for 1 month supply. Can switch to Levemir. Diagnosis E 11.65. Patient taking differently: Inject 45 Units into the skin at bedtime. 12/11/18   Leroy Sea, MD  insulin lispro (HUMALOG) 100 UNIT/ML injection Inject 7-19 Units into the skin 3 (three) times daily before meals. Blood Glucose level: 140-199 - 7 units, 200-250 - 9 units, 251-299 - 13 units,  300-349 - 17 units,  350 or above 19 units.    [provider]  Insulin Syringe-Needle U-100 25G X 1" 1 ML MISC For 4 times a day insulin SQ, 1 month supply. Diagnosis E11.65 12/11/18   Leroy Sea, MD  magnesium gluconate (MAGONATE) 500 MG tablet Take 500 mg by mouth in the morning, at noon, and at bedtime.    [provider]  melatonin 5 MG TABS Take 2.5-5 mg by mouth at bedtime as needed (for sleep). Patient not taking: Reported on 03/05/2022    [provider]  methylPREDNISolone (MEDROL DOSEPAK) 4 MG TBPK tablet follow package directions Patient not taking: Reported on 09/28/2020 12/11/18   Leroy Sea, MD  Multiple Vitamin (MULTIVITAMIN) tablet Take 1 tablet by  mouth daily. Women's Daily Multivitamin    [provider]  omeprazole (PRILOSEC) 20 MG capsule Take 20 mg by mouth daily. 03/06/18   [provider]  oxyCODONE (ROXICODONE) 5 MG immediate release tablet Take 1 tablet (5 mg total) by mouth every 4 (four) hours as needed for moderate pain or severe pain. Patient not taking: Reported on 03/05/2022  10/14/20   Dedra Skeens, PA-C  prednisoLONE acetate (PRED FORTE) 1 % ophthalmic suspension Place 1 drop into both eyes at bedtime. 10/04/17   [provider]  Semaglutide 14 MG TABS Take 14 mg by mouth at bedtime. This is part of a clinical trial. The patient is either taking Semaglutide 14mg  tab daily OR a Placebo.    [provider]  traMADol (ULTRAM) 50 MG tablet Take 1 tablet (50 mg total) by mouth every 6 (six) hours as needed for moderate pain. 10/14/20   Dedra Skeens, PA-C  vitamin B-12 (CYANOCOBALAMIN) 1000 MCG tablet Take 1,000 mcg by mouth daily.    [provider]    Labs on Admission:   CBC: Recent Labs  Lab 01/30/23 0837  WBC 10.3  HGB 14.2  HCT 43.8  MCV 89.4  PLT 248    Basic Metabolic Panel: Recent Labs  Lab 01/30/23 0837  NA 139  K 4.2  CL 103  CO2 23  GLUCOSE 195*  BUN 20  CREATININE 1.16*  CALCIUM 9.1    Liver Function Tests: Recent Labs  Lab 01/30/23 0837  AST 24  ALT 19  ALKPHOS 52  BILITOT 1.0  PROT 7.5  ALBUMIN 3.9   No results for input(s): "LIPASE", "AMYLASE" in the last 168 hours. No results for input(s): "AMMONIA" in the last 168 hours.  Cardiac Enzymes: No results for input(s): "CKTOTAL", "CKMB", "CKMBINDEX", "TROPONINI" in the last 168 hours.  BNP (last 3 results) Recent Labs    01/30/23 0837  BNP 289.9*    ProBNP (last 3 results) No results for input(s): "PROBNP" in the last 8760 hours.  CBG: No results for input(s): "GLUCAP" in the last 168 hours.  Lipase  No results found for: "LIPASE"   Urinalysis    Component Value Date/Time    COLORURINE STRAW (A) 10/05/2020 1309   APPEARANCEUR CLEAR (A) 10/05/2020 1309   APPEARANCEUR Hazy 01/28/2012 0923   LABSPEC 1.005 10/05/2020 1309   LABSPEC 1.017 01/28/2012 0923   PHURINE 6.0 10/05/2020 1309   GLUCOSEU NEGATIVE 10/05/2020 1309   GLUCOSEU >=500 01/28/2012 0923   HGBUR NEGATIVE 10/05/2020 1309   BILIRUBINUR NEGATIVE 10/05/2020 1309   KETONESUR NEGATIVE 10/05/2020 1309   PROTEINUR NEGATIVE 10/05/2020 1309   NITRITE NEGATIVE 10/05/2020 1309   LEUKOCYTESUR NEGATIVE 10/05/2020 1309   LEUKOCYTESUR Trace 01/28/2012 0923     Drugs of Abuse  No results found for: "LABOPIA", "COCAINSCRNUR", "LABBENZ", "AMPHETMU", "THCU", "LABBARB"    Radiological Exams on Admission: CT Angio Chest PE W and/or Wo Contrast  Result Date: 01/30/2023 CLINICAL DATA:  Pulmonary embolism (PE) suspected, high prob. Dyspnea. Shortness of breath. EXAM: CT ANGIOGRAPHY CHEST WITH CONTRAST TECHNIQUE: Multidetector CT imaging of the chest was performed using the standard protocol during bolus administration of intravenous contrast. Multiplanar CT image reconstructions and MIPs were obtained to evaluate the vascular anatomy. RADIATION DOSE REDUCTION: This exam was performed according to the departmental dose-optimization program which includes automated exposure control, adjustment of the mA and/or kV according to patient size and/or use of iterative reconstruction technique. CONTRAST:  75mL OMNIPAQUE IOHEXOL 350 MG/ML SOLN COMPARISON:  None Available. FINDINGS: Cardiovascular: No evidence of embolism to the proximal subsegmental pulmonary artery level. Mild cardiomegaly. Physiological pericardial effusion. No aortic aneurysm. There are coronary artery calcifications, in keeping with coronary artery disease. There are also mild peripheral atherosclerotic vascular calcifications of thoracic aorta and its major branches. Mediastinum/Nodes: Visualized thyroid gland appears grossly unremarkable. No solid / cystic  mediastinal masses. The esophagus  is nondistended precluding optimal assessment. There are few mildly prominent mediastinal and hilar lymph nodes, which do not meet the size criteria for lymphadenopathy and though indeterminate most likely benign in etiology. No axillary lymphadenopathy by size criteria. Lungs/Pleura: The central tracheo-bronchial tree is patent. There is mild, smooth, circumferential thickening of the segmental and subsegmental bronchial walls, throughout bilateral lungs, which is nonspecific. Findings are most commonly seen with bronchitis or reactive airway disease, such as asthma. There also occlusive and nonocclusive filling defects mainly in the bilateral lower lobe subsegmental bronchi, likely due to mucous secretions or aspiration. There are patchy areas of linear, plate-like atelectasis and/or scarring throughout bilateral lungs. No mass or consolidation. No pleural effusion or pneumothorax. No suspicious lung nodules. Upper Abdomen: There is reflux of contrast in the intrahepatic IVC and central intrahepatic veins, favoring right heart strain/failure. Visualized upper abdominal viscera within normal limits. Musculoskeletal: The visualized soft tissues of the chest wall are grossly unremarkable. No suspicious osseous lesions. There are mild multilevel degenerative changes in the visualized spine. Note is made of left shoulder arthroplasty. Review of the MIP images confirms the above findings. IMPRESSION: 1. No evidence of pulmonary embolism to the proximal subsegmental pulmonary artery level. 2. Mild, smooth, circumferential thickening of the segmental and subsegmental bronchial walls, throughout bilateral lungs, which is nonspecific. Findings are most commonly seen with bronchitis or reactive airway disease, such as asthma. 3. Occlusive and nonocclusive filling defects mainly in the bilateral lower lobe subsegmental bronchi, likely due to mucous secretions or aspiration. 4. Reflux of  contrast in the intrahepatic IVC and central intrahepatic veins, favoring right heart strain/failure. 5. Multiple other nonacute observations, as described above. Aortic Atherosclerosis (ICD10-I70.0). Electronically Signed   By: Jules Schick M.D.   On: 01/30/2023 11:17     Signed, Lorin Glass, MD [facility] 01/30/2023

## 2023-01-30 NOTE — ED Notes (Signed)
Pt taken to CT.

## 2023-01-31 ENCOUNTER — Other Ambulatory Visit (HOSPITAL_COMMUNITY): Payer: Self-pay

## 2023-01-31 DIAGNOSIS — J9601 Acute respiratory failure with hypoxia: Secondary | ICD-10-CM

## 2023-01-31 LAB — BASIC METABOLIC PANEL
Anion gap: 15 (ref 5–15)
BUN: 41 mg/dL — ABNORMAL HIGH (ref 8–23)
CO2: 20 mmol/L — ABNORMAL LOW (ref 22–32)
Calcium: 9 mg/dL (ref 8.9–10.3)
Chloride: 94 mmol/L — ABNORMAL LOW (ref 98–111)
Creatinine, Ser: 1.63 mg/dL — ABNORMAL HIGH (ref 0.44–1.00)
GFR, Estimated: 31 mL/min — ABNORMAL LOW (ref >60)
Glucose, Bld: 382 mg/dL — ABNORMAL HIGH (ref 70–99)
Potassium: 4 mmol/L (ref 3.5–5.1)
Sodium: 130 mmol/L — ABNORMAL LOW (ref 135–145)

## 2023-01-31 LAB — HEMOGLOBIN A1C
Hgb A1c MFr Bld: 7.6 % — ABNORMAL HIGH (ref 4.8–5.6)
Mean Plasma Glucose: 171.42 mg/dL

## 2023-01-31 LAB — CBC
HCT: 43.5 % (ref 36.0–46.0)
Hemoglobin: 14.4 g/dL (ref 12.0–15.0)
MCH: 29 pg (ref 26.0–34.0)
MCHC: 33.1 g/dL (ref 30.0–36.0)
MCV: 87.7 fL (ref 80.0–100.0)
Platelets: 248 10*3/uL (ref 150–400)
RBC: 4.96 MIL/uL (ref 3.87–5.11)
RDW: 13.9 % (ref 11.5–15.5)
WBC: 12.4 10*3/uL — ABNORMAL HIGH (ref 4.0–10.5)
nRBC: 0 % (ref 0.0–0.2)

## 2023-01-31 LAB — ECHOCARDIOGRAM COMPLETE
AR max vel: 1.26 cm2
AV Area VTI: 1.82 cm2
AV Area mean vel: 1.34 cm2
AV Mean grad: 10 mm[Hg]
AV Peak grad: 17.6 mm[Hg]
Ao pk vel: 2.1 m/s
Height: 64 in
S' Lateral: 2.5 cm
Weight: 2944 [oz_av]

## 2023-01-31 LAB — BRAIN NATRIURETIC PEPTIDE: B Natriuretic Peptide: 456.1 pg/mL — ABNORMAL HIGH (ref 0.0–100.0)

## 2023-01-31 LAB — T4, FREE: Free T4: 0.96 ng/dL (ref 0.61–1.12)

## 2023-01-31 LAB — GLUCOSE, CAPILLARY
Glucose-Capillary: 385 mg/dL — ABNORMAL HIGH (ref 70–99)
Glucose-Capillary: 435 mg/dL — ABNORMAL HIGH (ref 70–99)
Glucose-Capillary: 418 mg/dL — ABNORMAL HIGH (ref 70–99)
Glucose-Capillary: 407 mg/dL — ABNORMAL HIGH (ref 70–99)

## 2023-01-31 MED ORDER — FENOFIBRATE 54 MG PO TABS
54.0000 mg | ORAL_TABLET | Freq: Every day | ORAL | Status: DC
  Administered 2023-02-01 – 2023-02-04 (×4): 54 mg via ORAL
  Filled 2023-01-31 (×4): qty 1

## 2023-01-31 MED ORDER — PANTOPRAZOLE SODIUM 40 MG PO TBEC
40.0000 mg | DELAYED_RELEASE_TABLET | Freq: Every day | ORAL | Status: DC
  Administered 2023-01-31 – 2023-02-04 (×5): 40 mg via ORAL
  Filled 2023-01-31 (×5): qty 1

## 2023-01-31 MED ORDER — INSULIN ASPART 100 UNIT/ML IJ SOLN
0.0000 [IU] | Freq: Three times a day (TID) | INTRAMUSCULAR | Status: DC
  Administered 2023-01-31 – 2023-02-01 (×2): 15 [IU] via SUBCUTANEOUS
  Administered 2023-02-01: 8 [IU] via SUBCUTANEOUS
  Administered 2023-02-02: 5 [IU] via SUBCUTANEOUS
  Administered 2023-02-02: 2 [IU] via SUBCUTANEOUS
  Administered 2023-02-02: 5 [IU] via SUBCUTANEOUS
  Administered 2023-02-03 (×3): 2 [IU] via SUBCUTANEOUS
  Administered 2023-02-04: 3 [IU] via SUBCUTANEOUS
  Administered 2023-02-04: 2 [IU] via SUBCUTANEOUS
  Filled 2023-01-31 (×11): qty 1

## 2023-01-31 MED ORDER — INSULIN GLARGINE-YFGN 100 UNIT/ML ~~LOC~~ SOLN
20.0000 [IU] | Freq: Once | SUBCUTANEOUS | Status: AC
  Administered 2023-01-31: 20 [IU] via SUBCUTANEOUS
  Filled 2023-01-31: qty 0.2

## 2023-01-31 MED ORDER — HYDROXYZINE HCL 25 MG PO TABS
25.0000 mg | ORAL_TABLET | Freq: Two times a day (BID) | ORAL | Status: DC
  Administered 2023-01-31 – 2023-02-04 (×8): 25 mg via ORAL
  Filled 2023-01-31 (×8): qty 1

## 2023-01-31 MED ORDER — GABAPENTIN 100 MG PO CAPS
200.0000 mg | ORAL_CAPSULE | Freq: Three times a day (TID) | ORAL | Status: DC
  Administered 2023-01-31 – 2023-02-04 (×11): 200 mg via ORAL
  Filled 2023-01-31 (×11): qty 2

## 2023-01-31 MED ORDER — PREDNISOLONE ACETATE 1 % OP SUSP
1.0000 [drp] | Freq: Every day | OPHTHALMIC | Status: DC
  Administered 2023-01-31 – 2023-02-03 (×4): 1 [drp] via OPHTHALMIC
  Filled 2023-01-31: qty 1

## 2023-01-31 MED ORDER — ALPRAZOLAM 0.5 MG PO TABS
0.5000 mg | ORAL_TABLET | Freq: Once | ORAL | Status: DC
  Filled 2023-01-31: qty 1

## 2023-01-31 MED ORDER — ENOXAPARIN SODIUM 100 MG/ML IJ SOSY
1.0000 mg/kg | PREFILLED_SYRINGE | Freq: Two times a day (BID) | INTRAMUSCULAR | Status: DC
  Filled 2023-01-31: qty 1

## 2023-01-31 MED ORDER — LOPERAMIDE HCL 2 MG PO CAPS
4.0000 mg | ORAL_CAPSULE | Freq: Two times a day (BID) | ORAL | Status: DC | PRN
  Administered 2023-02-04: 4 mg via ORAL
  Filled 2023-01-31 (×2): qty 2

## 2023-01-31 MED ORDER — INSULIN GLARGINE-YFGN 100 UNIT/ML ~~LOC~~ SOLN
40.0000 [IU] | Freq: Every day | SUBCUTANEOUS | Status: DC
  Administered 2023-01-31 – 2023-02-03 (×4): 40 [IU] via SUBCUTANEOUS
  Filled 2023-01-31 (×6): qty 0.4

## 2023-01-31 MED ORDER — METOPROLOL TARTRATE 25 MG PO TABS
25.0000 mg | ORAL_TABLET | Freq: Two times a day (BID) | ORAL | Status: DC
  Administered 2023-01-31 – 2023-02-04 (×8): 25 mg via ORAL
  Filled 2023-01-31 (×8): qty 1

## 2023-01-31 MED ORDER — INSULIN ASPART 100 UNIT/ML IJ SOLN
5.0000 [IU] | Freq: Three times a day (TID) | INTRAMUSCULAR | Status: DC
  Administered 2023-01-31 – 2023-02-01 (×2): 5 [IU] via SUBCUTANEOUS
  Filled 2023-01-31 (×2): qty 1

## 2023-01-31 MED ORDER — ROSUVASTATIN CALCIUM 10 MG PO TABS
10.0000 mg | ORAL_TABLET | Freq: Every day | ORAL | Status: DC
  Administered 2023-01-31 – 2023-02-04 (×5): 10 mg via ORAL
  Filled 2023-01-31 (×5): qty 1

## 2023-01-31 MED ORDER — ENOXAPARIN SODIUM 100 MG/ML IJ SOSY
1.0000 mg/kg | PREFILLED_SYRINGE | INTRAMUSCULAR | Status: DC
  Administered 2023-01-31: 82.5 mg via SUBCUTANEOUS
  Filled 2023-01-31: qty 1

## 2023-01-31 MED ORDER — TRAZODONE HCL 50 MG PO TABS
50.0000 mg | ORAL_TABLET | Freq: Every day | ORAL | Status: DC
  Administered 2023-02-01 – 2023-02-03 (×3): 50 mg via ORAL
  Filled 2023-01-31 (×4): qty 1

## 2023-01-31 MED ORDER — DILTIAZEM HCL 30 MG PO TABS
60.0000 mg | ORAL_TABLET | Freq: Four times a day (QID) | ORAL | Status: DC
  Administered 2023-01-31 – 2023-02-01 (×3): 60 mg via ORAL
  Filled 2023-01-31 (×3): qty 2

## 2023-01-31 NOTE — Evaluation (Signed)
Physical Therapy Evaluation Patient Details Name: Samantha Freeman MRN: [id number] DOB: 11-24-1935 Today's Date: 01/31/2023  History of Present Illness  Patient presented to the ED 01/30/23 with complaint of progressively worsening shortness of breath for last several days.  PMH significant for obesity, DM2, HTN, HLD, sinus bradycardia, CKD, GERD/Schatzki's ring, anemia, anxiety/depression, vascular insufficiency, COVID pneumonia.  Clinical Impression  Patient is very pleasant and agrees to PT evaluation. She continues to have A fib and RVR, RN cleared patient to work with therapy. She is mod I with bed mobility, requiring increased time and effort. Patient is received on room air with saturations at 93% at rest. She performed sit to stand with min A and ambulated 25 feet in room holding to IV pole. Patient is weak requiring increased time for ambulation, and O2 saturations down to 86% after mobility. She will continue to benefit from skilled PT to improve functional independence, safety and strength.           If plan is discharge home, recommend the following: A little help with walking and/or transfers;A little help with bathing/dressing/bathroom;Assistance with cooking/housework;Assist for transportation   Can travel by private vehicle    yes    Equipment Recommendations None recommended by PT  Recommendations for Other Services       Functional Status Assessment Patient has had a recent decline in their functional status and demonstrates the ability to make significant improvements in function in a reasonable and predictable amount of time.     Precautions / Restrictions Precautions Precautions: Fall Restrictions Weight Bearing Restrictions: No      Mobility  Bed Mobility Overal bed mobility: Modified Independent             General bed mobility comments: increased time needed    Transfers Overall transfer level: Needs assistance Equipment used: None Transfers: Sit  to/from Stand Sit to Stand: Min assist                Ambulation/Gait Ambulation/Gait assistance: Contact guard assist Gait Distance (Feet): 25 Feet Assistive device: IV Pole Gait Pattern/deviations: Step-through pattern, Decreased step length - right, Decreased step length - left, Decreased stride length Gait velocity: decreased     General Gait Details: patient is weak with mobility. increased time needed for ambulation and UE support. O2 sats down to 86% on room air with mobility. HR up to 140s briefly.   Stairs            Wheelchair Mobility     Tilt Bed    Modified Rankin (Stroke Patients Only)       Balance Overall balance assessment: Needs assistance Sitting-balance support: Feet supported Sitting balance-Leahy Scale: Good     Standing balance support: Bilateral upper extremity supported, During functional activity, Reliant on assistive device for balance Standing balance-Leahy Scale: Fair Standing balance comment: requires at least single UE support for balance                             Pertinent Vitals/Pain Pain Assessment Pain Assessment: No/denies pain    Home Living Family/patient expects to be discharged to:: Private residence Living Arrangements: Alone Available Help at Discharge: Family;Available PRN/intermittently Type of Home: House Home Access: Level entry       Home Layout: One level Home Equipment: Agricultural consultant (2 wheels);Cane - single point;Shower seat      Prior Function Prior Level of Function : Independent/Modified Independent  Mobility Comments: Had recently started using Cane due to weakness ADLs Comments: mod I     Extremity/Trunk Assessment   Upper Extremity Assessment Upper Extremity Assessment: Generalized weakness    Lower Extremity Assessment Lower Extremity Assessment: Generalized weakness    Cervical / Trunk Assessment Cervical / Trunk Assessment: Normal  Communication    Communication Communication: No apparent difficulties Cueing Techniques: Verbal cues  Cognition Arousal: Alert Behavior During Therapy: WFL for tasks assessed/performed Overall Cognitive Status: Within Functional Limits for tasks assessed                                          General Comments      Exercises     Assessment/Plan    PT Assessment Patient needs continued PT services  PT Problem List Decreased strength;Decreased activity tolerance;Decreased balance;Decreased mobility;Cardiopulmonary status limiting activity       PT Treatment Interventions DME instruction;Gait training;Functional mobility training;Therapeutic activities;Therapeutic exercise;Patient/family education    PT Goals (Current goals can be found in the Care Plan section)  Acute Rehab PT Goals Patient Stated Goal: to return home, improve strength PT Goal Formulation: With patient Time For Goal Achievement: 02/14/23 Potential to Achieve Goals: Good    Frequency Min 1X/week     Co-evaluation               AM-PAC PT "6 Clicks" Mobility  Outcome Measure Help needed turning from your back to your side while in a flat bed without using bedrails?: A Little Help needed moving from lying on your back to sitting on the side of a flat bed without using bedrails?: A Little Help needed moving to and from a bed to a chair (including a wheelchair)?: A Little Help needed standing up from a chair using your arms (e.g., wheelchair or bedside chair)?: A Little Help needed to walk in hospital room?: A Little Help needed climbing 3-5 steps with a railing? : A Lot 6 Click Score: 17    End of Session Equipment Utilized During Treatment: Gait belt;Oxygen Activity Tolerance: Patient limited by fatigue Patient left: in chair;with call bell/phone within reach Nurse Communication: Mobility status PT Visit Diagnosis: Unsteadiness on feet (R26.81);Muscle weakness (generalized) (M62.81);Difficulty  in walking, not elsewhere classified (R26.2)    Time: 1610-9604 PT Time Calculation (min) (ACUTE ONLY): 36 min   Charges:   PT Evaluation $PT Eval Moderate Complexity: 1 Mod PT Treatments $Gait Training: 8-22 mins PT General Charges $$ ACUTE PT VISIT: 1 Visit         Kathrynn Backstrom, PT, GCS 01/31/23,9:51 AM

## 2023-01-31 NOTE — Progress Notes (Signed)
Progress Note   Patient: Samantha Freeman WNU:[id number] DOB: May 31, 1935 DOA: 01/30/2023     1 DOS: the patient was seen and examined on 01/31/2023   Brief hospital course: 87yo with h/o class 1 obesity, DM, HTN, HLD, possible long COVID syndrome, and CKD who presented on 10/9 with SOB. She tested positive for COVID with symptoms 2 months ago and then had 2 subsequent URIs about 3 weeks apart.  She was treated with amoxil and steroids.   O2 83% on RA, placed on 4L [place] O2.  CTA with bronchitis.  Patient is DNR/DNI.  She was treated with steroids, bronchodilators, Mucinex, and 1 dose of Lasix.  She was subsequently noted to be in afib, which was thought to be related to her symptoms.  She was started on Cardizem drip and full-dose Lovenox.  Assessment and Plan:  Acute respiratory failure with hypoxia Presented with several days of progressively worsening shortness of breath, not responding to Augmentin and prednisone as an outpatient Patient reports her symptoms started this time after her diagnosis of COVID 2 months ago. Noted to be hypoxic on ambulation requiring 4 L oxygen. On presenting exam had bilateral crackles, no wheezing No fever, WBC count normal, procalcitonin level normal, elevated BNP CT chest findings as above suggestive of acute bronchitis, mucous plugs.  However also mentions right heart strain/failure. At this time, patient's symptoms could be secondary to a combination of reactive airway disease as well as CHF. Does not use oxygen at home She was subsequently found to be in afib with RVR yesterday and was given a dose of Lasix with rate control She had subsequently had resolution in respiratory symptoms and is now comfortable on room air CT chest with findings of bronchitis and mucous plugs Given 1 dose of IV Solu-Medrol in the ED and Solu-Medrol IV 40 mg daily  However, given resolution of symptoms, will stop steroids at this time (having significant hyperglycemia)  New onset  afib Symptoms above appear to be related to this issue Since the afib onset is unknown, will focus on rate control and searching for the underlying cause at this time. Admitted to progressive for Diltiazem drip as per protocol with plan to transition to PO Diltiazem once heart rate is controlled (resting HR 110 or lower) Will request Echocardiogram for further evaluation  Will consult cardiology  CHA2DS2-VASc Score is >2 and so patient would benefit from oral anticoagulation. There is evidence of net benefit even in the extremely elderly population. She is adamantly opposed to Coumadin, but reports that she will have difficulty affording Eliquis and is likely to need medication assistance  Possible CHF exacerbation Although no prior h/o of CHF, patient reports she used to be on Lasix in the past for pedal edema. Had tachycardia, bilateral crackles on auscultation and BNP elevated on presentation CT chest showed reflux of contrast in the intrahepatic IVC and central intrahepatic veins favoring right heart strain/failure. Given 1 dose of Lasix IV 40 mg Update echocardiogram Follows up with cardiologist Dr. Darrold Junker, who has been consulted Symptoms have resolved so anticipate that volume overload was related to afib with RVR rather than to heart failure   Essential hypertension Stop amlodipine due to need for diltiazem Hold enalapril, Propranolol   Type 2 diabetes mellitus A1c is 7.6 Significant hyperglycemia thus far during hospitalization, likely due to steroids received Continue Lantus - started on 20 units but takes 40 at home Hold Farxiga Continue Neurontin   HLD Continue fenofibrate, rosuvastatin   Abnormal TSH TSH  slightly elevated to 4.8, normal free T4 - likely sick euthyroid   AKI on stage 3a CKD Appears to have worsened creatinine that is likely associated with diuresis as well as diminished renal perfusion in the setting of RVR Hold diuresis and recheck in AM    Anxiety/depression Continue Ativan, hydroxyzine  Class 1 obesity -Body mass index is 31.58 kg/m..  -Weight loss should be encouraged -Outpatient PCP/bariatric medicine/bariatric surgery f/u encouraged     Consultants: Cardiology  Procedures: Echocardiogram 10/9  Antibiotics: None  30 Day Unplanned Readmission Risk Score    Flowsheet Row ED to Hosp-Admission (Current) from 01/30/2023 in [facility]  30 Day Unplanned Readmission Risk Score (%) 15.71 Filed at 01/31/2023 0801       This score is the patient's risk of an unplanned readmission within 30 days of being discharged (0 -100%). The score is based on dignosis, age, lab data, medications, orders, and past utilization.   Low:  0-14.9   Medium: 15-21.9   High: 22-29.9   Extreme: 30 and above           Subjective: Feeling better - off O2 and breathing is significantly improved.  Still feeling fatigued.   Objective: Vitals:   01/31/23 0700 01/31/23 0839  BP: (!) 114/90   Pulse: (!) 115 (!) 110  Resp:    Temp: (!) 96.3 F (35.7 C) (!) 96.3 F (35.7 C)  SpO2: 90% 91%    Intake/Output Summary (Last 24 hours) at 01/31/2023 0912 Last data filed at 01/31/2023 0500 Gross per 24 hour  Intake 1076.69 ml  Output 500 ml  Net 576.69 ml   Filed Weights   01/30/23 0805  Weight: 83.5 kg    Exam:  General:  Appears calm and comfortable and is in NAD, in bedside chair, on RA Eyes:  EOMI, normal lids, iris ENT:  grossly normal hearing, lips & tongue, mmm Neck:  no LAD, masses or thyromegaly Cardiovascular:  Irregularly rhythm with rate in low 100s, no m/r/g. No LE edema.  Respiratory:   CTA bilaterally with no wheezes/rales/rhonchi.  Normal respiratory effort. Abdomen:  soft, NT, ND Skin:  no rash or induration seen on limited exam Musculoskeletal:  grossly normal tone BUE/BLE, good ROM, no bony abnormality Psychiatric:  grossly normal mood and affect, speech fluent and appropriate,  AOx3 Neurologic:  CN 2-12 grossly intact, moves all extremities in coordinated fashion   Data Reviewed: I have reviewed the patient's lab results since admission.  Pertinent labs for today include:   Na++ 130, down from 139 Glucose 382 BUN 41/Creatinine 1.63/GFR 31; 20/1.16 BNP 456.1 WBC 12.4     Family Communication: None present; I spoke with her son by telephone  Disposition: Status is: Inpatient Remains inpatient appropriate because: ongoing management     Time spent: 50 minutes  Unresulted Labs (From admission, onward)     Start     Ordered   01/31/23 0500  Hemoglobin A1c  Tomorrow morning,   R       Comments: To assess prior glycemic control    01/30/23 1237             Author: Jonah Blue, MD 01/31/2023 9:12 AM  For on call review [email or web address].

## 2023-01-31 NOTE — Evaluation (Signed)
Clinical/Bedside Swallow Evaluation Patient Details  Name: Samantha Freeman MRN: [id number] Date of Birth: [birth year]  Today's Date: 01/31/2023 Time: SLP Start Time (ACUTE ONLY): 1358 SLP Stop Time (ACUTE ONLY): 1445 SLP Time Calculation (min) (ACUTE ONLY): 47 min  Past Medical History:  Past Medical History:  Diagnosis Date   Anemia    Anxiety    Aortic stenosis, mild    Arthritis    CKD (chronic kidney disease), stage III (HCC)    Complication of anesthesia    Propofol anaphylaxis   Cortical cataract    DDD (degenerative disc disease), lumbar    Depression    Dyspnea    GERD (gastroesophageal reflux disease)    Headache    Heart murmur    History of 2019 novel coronavirus disease (COVID-19) 12/01/2018   HLD (hyperlipidemia)    Hypertension    Pneumonia    Schatzki's ring    Seasonal allergies    T2DM (type 2 diabetes mellitus) (HCC)    Valvular insufficiency    Past Surgical History:  Past Surgical History:  Procedure Laterality Date   ABDOMINAL HYSTERECTOMY     APPENDECTOMY     BICEPT TENODESIS Right 05/13/2018   Procedure: BICEPS TENODESIS;  Surgeon: Christena Flake, MD;  Location: [facility] ORS;  Service: Orthopedics;  Laterality: Right;   CARDIAC CATHETERIZATION     COLONOSCOPY     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 female  with a past medical history of mild aortic stenosis, bradycardia, type 2 diabetes,  hypercholesterolemia who presented to the ED on 01/30/2023 for SOB. Also noted to be in new onset atrial fibrillation.  Cardiology was consulted for further evaluation.  Per note, "patient reports that she has been experiencing progressively worsening shortness of breath over the last few weeks.  States that she was diagnosed with COVID about 3 weeks ago and was treated with antibiotics and steroids.  She has persistently felt fatigued and short of breath since her diagnosis.  She also reports episodes of palpitations and "fluttering" over the last few weeks.  She denies any chest pain.  Yesterday morning she reports that while walking to her bathroom and back to her bedroom she became extremely short of breath and thus called EMS, she was brought to the ED for further evaluation.  She was started on diltiazem infusion for rate control.  She has been weaned to room air.  She also endorses some recent lower extremity edema, she states that she has had issues with leg swelling for many years and takes lasix as needed at home.".  Chest CT: The central tracheo-bronchial tree is patent. There is  mild, smooth, circumferential thickening of the segmental and  subsegmental bronchial walls, throughout bilateral lungs, which is  nonspecific. Findings are most commonly seen with bronchitis or  reactive airway disease, such as asthma. There also occlusive and  nonocclusive filling defects mainly in the bilateral lower  lobe  subsegmental bronchi, likely due to mucous secretions or aspiration.  There are patchy areas of linear, plate-like atelectasis and/or  scarring throughout bilateral lungs. No mass or consolidation. No  pleural effusion.   Per Imaging in 2009: prominent B-ring with a small hiatal hernia per DG Esophagus. Pt endorsed having her Esophagus "stretched".    Assessment / Plan / Recommendation  Clinical Impression   Pt seen for BSE. Pt awake, verbal and engaged easily w/ this SLP. Talkative about many topics.  Followed all instructions. Drinking water often during visit w/out difficulty noted.    Pt appears to present w/ functional oropharyngeal phase swallow w/ No overt oropharyngeal phase dysphagia noted, No neuromuscular deficits noted. Pt consumed po trials w/ No overt, clinical s/s of aspiration during po trials.  Pt appears at reduced risk for aspiration following general aspiration precautions. However, pt does have challenging factors that could impact her oropharyngeal swallowing to include deconditioning/weakness and cardiopulmonary decline, Cardiology following and advanced age. These factors can increase risk for aspiration, dysphagia as well as decreased oral intake overall.  During po trials, pt consumed all consistencies w/ no overt coughing, decline in vocal quality, or change in respiratory presentation during/post trials. Oral phase appeared grossly [facility] w/ timely bolus management, mastication, and control of bolus propulsion for A-P transfer for swallowing. Oral clearing achieved w/ all trial consistencies -- moistened, soft foods given.  OM Exam appeared [facility] w/ no unilateral weakness noted. Speech Clear. Pt fed self w/ setup support.  Pt endorsed "not paying attention" and "talking sometimes when I am eating and drinking" which may have resulted in coughing w/ tea at a recent meal. Pt denied this being a regular occurrence. This description could be noted in setting of pt's age.  Recommend continue a Regular consistency diet w/ well-Cut meats, moistened foods; Thin liquids -- carefully monitor straw use. Recommend general aspiration precautions, reduce distractions and talking during meals/po intake. Pills WHOLE in Puree for safer, easier swallowing.  Education given on Pills in Puree; food consistencies and easy to eat options; general aspiration precautions to pt and NSG. MD/NSG to reconsult if any new needs arise. MD/NSG updated, agreed. Recommend GI f/u if any Esophageal phase Dysmotility  per pt report d/t pt's h/o dysmotility, dilation. Recommend Dietician f/u for support. SLP Visit Diagnosis: Dysphagia, unspecified (R13.10) (baseline cardiopulmonary issues)    Aspiration Risk   (reduced following general aspiration precautions)    Diet Recommendation   Thin;Age appropriate regular (cut, moistened foods) = continue a Regular consistency diet w/ well-Cut meats, moistened foods; Thin liquids -- carefully monitor straw use. Recommend general aspiration precautions, reduce distractions and talking during meals/po intake.   Medication Administration: Whole meds with puree (for ease of swallow)    Other  Recommendations Recommended Consults: Consider GI evaluation;Consider esophageal assessment (Dietician f/u -- all if needed) Oral Care Recommendations: Oral care BID;Patient independent with oral care    Recommendations for follow up therapy are one component of a multi-disciplinary discharge planning process, led by the attending physician.  Recommendations may be updated based on patient status, additional functional criteria and insurance authorization.  Follow up Recommendations No SLP follow up      Assistance Recommended at Discharge  PRN  Functional Status Assessment Patient has had a recent decline in their functional status and demonstrates the ability to make significant improvements in function in a reasonable and predictable amount of time.  Frequency and Duration  (n/a)   (n/a)  Prognosis Prognosis for improved oropharyngeal function: Fair (-Good) Barriers to Reach Goals: Time post onset;Severity of deficits      Swallow Study   General Date of Onset: 01/30/23 HPI: Pt is a 87 y.o. female  with a past medical history of mild aortic stenosis, bradycardia, type 2 diabetes, hypercholesterolemia who presented to the ED on 01/30/2023 for SOB. Also noted to be in new onset atrial fibrillation.  Cardiology was consulted for further evaluation.  Per note, "patient  reports that she has been experiencing progressively worsening shortness of breath over the last few weeks.  States that she was diagnosed with COVID about 3 weeks ago and was treated with antibiotics and steroids.  She has persistently felt fatigued and short of breath since her diagnosis.  She also reports episodes of palpitations and "fluttering" over the last few weeks.  She denies any chest pain.  Yesterday morning she reports that while walking to her bathroom and back to her bedroom she became extremely short of breath and thus called EMS, she was brought to the ED for further evaluation.  She was started on diltiazem infusion for rate control.  She has been weaned to room air.  She also endorses some recent lower extremity edema, she states that she has had issues with leg swelling for many years and takes lasix as needed at home.".  Chest CT: The central tracheo-bronchial tree is patent. There is  mild, smooth, circumferential thickening of the segmental and  subsegmental bronchial walls, throughout bilateral lungs, which is  nonspecific. Findings are most commonly seen with bronchitis or  reactive airway disease, such as asthma. There also occlusive and  nonocclusive filling defects mainly in the bilateral lower lobe  subsegmental bronchi, likely due to mucous secretions or aspiration.  There are patchy areas of linear, plate-like atelectasis and/or  scarring throughout bilateral lungs. No mass or consolidation. No  pleural effusion.   Per Imaging in 2009: prominent B-ring with a small hiatal hernia per DG Esophagus. Pt endorsed having her Esophagus "stretched". Type of Study: Bedside Swallow Evaluation Previous Swallow Assessment: none Diet Prior to this Study: Regular;Thin liquids (Level 0) Temperature Spikes Noted: No Respiratory Status: Room air History of Recent Intubation: No Behavior/Cognition: Alert;Cooperative;Pleasant mood;Distractible;Requires cueing (min) Oral Cavity Assessment: Within  Functional Limits Oral Care Completed by SLP: Recent completion by staff Oral Cavity - Dentition:  (adequate) Vision: Functional for self-feeding Self-Feeding Abilities: Able to feed self;Needs set up Patient Positioning: Upright in chair Baseline Vocal Quality: Normal Volitional Cough: Strong Volitional Swallow: Able to elicit    Oral/Motor/Sensory Function Overall Oral Motor/Sensory Function: Within functional limits   Ice Chips Ice chips: Not tested   Thin Liquid Thin Liquid: Within functional limits Presentation: Cup;Self Fed;Straw (3-4 trials via each)    Nectar Thick Nectar Thick Liquid: Not tested   Honey Thick Honey Thick Liquid: Not tested   Puree Puree: Not tested   Solid     Solid: Within functional limits Presentation: Self Fed;Spoon (baked potato; lunch meal)        Jerilynn Som, MS, CCC-SLP Speech Language Pathologist Rehab Services; [facility] [place] [phone] (ascom) Juwana Thoreson 01/31/2023,5:23 PM

## 2023-01-31 NOTE — TOC Benefit Eligibility Note (Signed)
Patient Product/process development scientist completed.    The patient is insured through [facility]. Patient has Medicare and is not eligible for a copay card, but may be able to apply for patient assistance, if available.    Ran test claim for Eliquis 5 mg and the current 30 day co-pay is $149.53 due to being in Coverage Gap (donut hole).  Ran test claim for Xarelto 20 mg and the current 30 day co-pay is $143.30 due to being in Coverage Gap (donut hole).  This test claim was processed through [facility]- copay amounts may vary at other pharmacies due to pharmacy/plan contracts, or as the patient moves through the different stages of their insurance plan.     Roland Earl, CPHT Pharmacy Technician III Certified Patient Advocate [facility] Patient Advocate Team Direct Number: [phone]  Fax: [phone]

## 2023-01-31 NOTE — Inpatient Diabetes Management (Signed)
Inpatient Diabetes Program Recommendations  AACE/ADA: New Consensus Statement on Inpatient Glycemic Control   Target Ranges:  Prepandial:   less than 140 mg/dL      Peak postprandial:   less than 180 mg/dL (1-2 hours)      Critically ill patients:  140 - 180 mg/dL    Latest Reference Range & Units 01/30/23 16:17 01/30/23 20:55 01/31/23 08:33  Glucose-Capillary 70 - 99 mg/dL 161 (H) 096 (H) 045 (H)    Latest Reference Range & Units 01/30/23 08:37 01/31/23 05:06  Glucose 70 - 99 mg/dL 409 (H) 811 (H)    Review of Glycemic Control  Diabetes history: DM2 Outpatient Diabetes medications: Lantus 40 units at bedtime, Humalog 10-30 units TID with, Farxiga 10 mg daily Current orders for Inpatient glycemic control: Semglee 20 units at bedtime, Novolog 0-9 units TID, Novolog 0-5 units at bedtime; Solumedrol 40 mg daily  Inpatient Diabetes Program Recommendations:    Insulin: If steroids continued as ordered, please consider increasing Semglee to 30 units at bedtime and adding Novolog 5 units TID with meals for meal coverage if patient eats at least 50% of meals.  NOTE: Patient admitted with respiratory failure, acute bronchitis, and possible CHF. Spoke with patient over the phone to confirm outpatient DM medications. Patient confirms that she is taking Lantus 40 units at bedtime, Humalog 10-30 units with meals, and Farxiga 10 mg daily. Patient notes that she recently got a letter regarding the [name] and she has to reapply for their medication assistance to be able to get the medication (can not afford). Encouraged patient to reach out to PCP or Endocrinologist to assist with re-applying. Patient states that she takes the Humalog based on a sliding scale and she thinks the least she takes is 10 units and the most she has ever taken is 30 units. Patient notes that she is frequently having hypoglycemia between 10-11 am after taking Humalog with breakfast so she feels that the morning dose needs to be  decreased and she has taken the liberty of decreasing it herself to prevent hypoglycemia. Noted Rybelsus on home medication list and patient states she is not taking Rybelsus; was in a research program (per note on home med list pt was taking Rybelsus or  placebo) but program has ended. Patient notes that she frequently eats snacks at bedtime if CBG is on the low side before bed to prevent hypoglycemia during the night. Patient is using [employer] Libre3 CGM for glucose monitoring. Discussed current insulin orders and that steroids have been given and currently ordered which are contributing to hyperglycemia. Patient notes that she has an appointment to establish care with a new Endocrinologist (could not continue to drive to [facility] for appointments). Encouraged patient to make new Endocrinologist aware if she is experiencing any hypoglycemia at home and if she is having to eat extra food to prevent hypoglycemia as insulin may need to be adjusted. Patient states she has everything she needs at home for DM and verbalized understanding of information discussed.   Thanks, Orlando Penner, RN, MSN, CDCES Diabetes Coordinator Inpatient Diabetes Program [phone] (Team Pager from 8am to 5pm)

## 2023-01-31 NOTE — Hospital Course (Signed)
87yo with h/o class 1 obesity, DM, HTN, HLD, possible long COVID syndrome, and CKD who presented on 10/9 with SOB. She tested positive for COVID with symptoms 2 months ago and then had 2 subsequent URIs about 3 weeks apart.  She was treated with amoxil and steroids.   O2 83% on RA, placed on 4L [place] O2.  CTA with bronchitis.  Patient is DNR/DNI.  She was treated with steroids, bronchodilators, Mucinex, and 1 dose of Lasix.  She was subsequently noted to be in afib, which was thought to be related to her symptoms.  She was started on Cardizem drip and full-dose Lovenox.

## 2023-01-31 NOTE — Consult Note (Signed)
Irregularly irregular, fast rate. Normal S1 and S2 without gallops or murmurs.  Abdomen: Non-distended appearing.  Msk: Normal strength and tone for age. Extremities: Warm and well perfused. No clubbing, cyanosis. No edema.  Neuro: Alert and oriented X 3. Psych: Answers questions  appropriately.   Labs: Basic Metabolic Panel: Recent Labs    01/30/23 0837 01/31/23 0506  NA 139 130*  K 4.2 4.0  CL 103 94*  CO2 23 20*  GLUCOSE 195* 382*  BUN 20 41*  CREATININE 1.16* 1.63*  CALCIUM 9.1 9.0   Liver Function Tests: Recent Labs    01/30/23 0837  AST 24  ALT 19  ALKPHOS 52  BILITOT 1.0  PROT 7.5  ALBUMIN 3.9   No results for input(s): "LIPASE", "AMYLASE" in the last 72 hours. CBC: Recent Labs    01/30/23 0837 01/31/23 0506  WBC 10.3 12.4*  HGB 14.2 14.4  HCT 43.8 43.5  MCV 89.4 87.7  PLT 248 248   Cardiac Enzymes: Recent Labs    01/30/23 0837 01/30/23 1506 01/30/23 1646  TROPONINIHS 8 7 7    BNP: Recent Labs    01/30/23 0837 01/31/23 0506  BNP 289.9* 456.1*   D-Dimer: No results for input(s): "DDIMER" in the last 72 hours. Hemoglobin A1C: Recent Labs    01/31/23 0506  HGBA1C 7.6*   Fasting Lipid Panel: No results for input(s): "CHOL", "HDL", "LDLCALC", "TRIG", "CHOLHDL", "LDLDIRECT" in the last 72 hours. Thyroid Function Tests: Recent Labs    01/30/23 0837  TSH 4.844*   Anemia Panel: No results for input(s): "VITAMINB12", "FOLATE", "FERRITIN", "TIBC", "IRON", "RETICCTPCT" in the last 72 hours.   Radiology: ECHOCARDIOGRAM COMPLETE  Result Date: 01/31/2023    ECHOCARDIOGRAM REPORT   Patient Name:   Samantha Freeman Date of Exam: 01/30/2023 Medical Rec #:  [id number]       Height:       64.0 in Accession #:    [id number]      Weight:       184.0 lb Date of Birth:  1936-04-05        BSA:          1.888 m Patient Age:    87 years        BP:           131/90 mmHg Patient Gender: F               HR:           133 bpm. Exam Location:  [facility] Procedure: 2D Echo, Cardiac Doppler and Color Doppler Indications:     R91.31 Abnormal EKG  History:         Patient has no prior history of Echocardiogram examinations.                  Signs/Symptoms:Dyspnea and Murmur; Risk Factors:Hypertension,                  Diabetes and Dyslipidemia. COVID-19.  Pneumonia.  Sonographer:     Daphine Deutscher RDCS Referring Phys:  [id number] [facility]HAL Diagnosing Phys: Marcina Millard MD IMPRESSIONS  1. Left ventricular ejection fraction, by estimation, is 60 to 65%. The left ventricle has normal function. The left ventricle has no regional wall motion abnormalities. There is moderate left ventricular hypertrophy. Indeterminate diastolic filling due  to E-A fusion.  2. Right ventricular systolic function is normal. The right ventricular size is normal.  3. Left atrial size was moderately dilated.  4. Right atrial size was mildly  [facility] CARDIOLOGY CONSULT NOTE       Patient ID: Samantha Freeman MRN: [id number] DOB/AGE: 09/28/1935 87 y.o.  Admit date: 01/30/2023 Referring Physician Dr. Jonah Blue Primary Physician Dr. Judithann Sheen Primary Cardiologist Dr. Darrold Junker Reason for Consultation atrial fibrillation RVR  HPI: Samantha Freeman is a 87 y.o. female  with a past medical history of mild aortic stenosis, bradycardia, type 2 diabetes, hypercholesterolemia who presented to the ED on 01/30/2023 for SOB. Also noted to be in new onset atrial fibrillation. Cardiology was consulted for further evaluation.   Patient reports that she has been experiencing progressively worsening shortness of breath over the last few weeks.  States that she was diagnosed with COVID about 3 weeks ago and was treated with antibiotics and steroids.  She has persistently felt fatigued and short of breath since her diagnosis.  She also reports episodes of palpitations and "fluttering" over the last few weeks.  She denies any chest pain.  Yesterday morning she reports that while walking to her bathroom and back to her bedroom she became extremely short of breath and thus called EMS, she was brought to the ED for further evaluation.  Workup in the ED notable for creatinine 1.16, potassium 4.2, hemoglobin 14.2.  BNP 289.  Troponins trended 8 > 7 > 7.  EKG demonstrated atrial fibrillation RVR.  CT chest concerning for bronchitis with likely mucous secretions in bilateral lower lobe bronchi.  She was given IV diuretics in the ED and started on supplemental O2.  At the time of my evaluation this afternoon the patient is resting comfortably in bedside chair.  States that her shortness of breath is mildly improved.  Overall feels that her palpitation symptoms are improved.  She was started on diltiazem infusion for rate control.  She has been weaned to room air.  She also endorses some recent lower extremity edema, she states that she has had issues with leg  swelling for many years and takes lasix as needed at home.  Review of systems complete and found to be negative unless listed above    Past Medical History:  Diagnosis Date   Anemia    Anxiety    Aortic stenosis, mild    Arthritis    CKD (chronic kidney disease), stage III (HCC)    Complication of anesthesia    Propofol anaphylaxis   Cortical cataract    DDD (degenerative disc disease), lumbar    Depression    Dyspnea    GERD (gastroesophageal reflux disease)    Headache    Heart murmur    History of 2019 novel coronavirus disease (COVID-19) 12/01/2018   HLD (hyperlipidemia)    Hypertension    Pneumonia    Schatzki's ring    Seasonal allergies    T2DM (type 2 diabetes mellitus) (HCC)    Valvular insufficiency     Past Surgical History:  Procedure Laterality Date   ABDOMINAL HYSTERECTOMY     APPENDECTOMY     BICEPT TENODESIS Right 05/13/2018   Procedure: BICEPS TENODESIS;  Surgeon: Christena Flake, MD;  Location: [facility] ORS;  Service: Orthopedics;  Laterality: Right;   CARDIAC CATHETERIZATION     COLONOSCOPY     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   EYE SURGERY Right 09/2013   Corneal transplant   JOINT REPLACEMENT Right 2015   knee   REVERSE SHOULDER ARTHROPLASTY Left 10/13/2020   Procedure: REVERSE SHOULDER ARTHROPLASTY;  Surgeon: Christena Flake, MD;  Location: [facility] ORS;  Service: Orthopedics;  [facility] CARDIOLOGY CONSULT NOTE       Patient ID: Samantha Freeman MRN: [id number] DOB/AGE: 09/28/1935 87 y.o.  Admit date: 01/30/2023 Referring Physician Dr. Jonah Blue Primary Physician Dr. Judithann Sheen Primary Cardiologist Dr. Darrold Junker Reason for Consultation atrial fibrillation RVR  HPI: Samantha Freeman is a 87 y.o. female  with a past medical history of mild aortic stenosis, bradycardia, type 2 diabetes, hypercholesterolemia who presented to the ED on 01/30/2023 for SOB. Also noted to be in new onset atrial fibrillation. Cardiology was consulted for further evaluation.   Patient reports that she has been experiencing progressively worsening shortness of breath over the last few weeks.  States that she was diagnosed with COVID about 3 weeks ago and was treated with antibiotics and steroids.  She has persistently felt fatigued and short of breath since her diagnosis.  She also reports episodes of palpitations and "fluttering" over the last few weeks.  She denies any chest pain.  Yesterday morning she reports that while walking to her bathroom and back to her bedroom she became extremely short of breath and thus called EMS, she was brought to the ED for further evaluation.  Workup in the ED notable for creatinine 1.16, potassium 4.2, hemoglobin 14.2.  BNP 289.  Troponins trended 8 > 7 > 7.  EKG demonstrated atrial fibrillation RVR.  CT chest concerning for bronchitis with likely mucous secretions in bilateral lower lobe bronchi.  She was given IV diuretics in the ED and started on supplemental O2.  At the time of my evaluation this afternoon the patient is resting comfortably in bedside chair.  States that her shortness of breath is mildly improved.  Overall feels that her palpitation symptoms are improved.  She was started on diltiazem infusion for rate control.  She has been weaned to room air.  She also endorses some recent lower extremity edema, she states that she has had issues with leg  swelling for many years and takes lasix as needed at home.  Review of systems complete and found to be negative unless listed above    Past Medical History:  Diagnosis Date   Anemia    Anxiety    Aortic stenosis, mild    Arthritis    CKD (chronic kidney disease), stage III (HCC)    Complication of anesthesia    Propofol anaphylaxis   Cortical cataract    DDD (degenerative disc disease), lumbar    Depression    Dyspnea    GERD (gastroesophageal reflux disease)    Headache    Heart murmur    History of 2019 novel coronavirus disease (COVID-19) 12/01/2018   HLD (hyperlipidemia)    Hypertension    Pneumonia    Schatzki's ring    Seasonal allergies    T2DM (type 2 diabetes mellitus) (HCC)    Valvular insufficiency     Past Surgical History:  Procedure Laterality Date   ABDOMINAL HYSTERECTOMY     APPENDECTOMY     BICEPT TENODESIS Right 05/13/2018   Procedure: BICEPS TENODESIS;  Surgeon: Christena Flake, MD;  Location: [facility] ORS;  Service: Orthopedics;  Laterality: Right;   CARDIAC CATHETERIZATION     COLONOSCOPY     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   EYE SURGERY Right 09/2013   Corneal transplant   JOINT REPLACEMENT Right 2015   knee   REVERSE SHOULDER ARTHROPLASTY Left 10/13/2020   Procedure: REVERSE SHOULDER ARTHROPLASTY;  Surgeon: Christena Flake, MD;  Location: [facility] ORS;  Service: Orthopedics;  [facility] CARDIOLOGY CONSULT NOTE       Patient ID: Samantha Freeman MRN: [id number] DOB/AGE: 09/28/1935 87 y.o.  Admit date: 01/30/2023 Referring Physician Dr. Jonah Blue Primary Physician Dr. Judithann Sheen Primary Cardiologist Dr. Darrold Junker Reason for Consultation atrial fibrillation RVR  HPI: Samantha Freeman is a 87 y.o. female  with a past medical history of mild aortic stenosis, bradycardia, type 2 diabetes, hypercholesterolemia who presented to the ED on 01/30/2023 for SOB. Also noted to be in new onset atrial fibrillation. Cardiology was consulted for further evaluation.   Patient reports that she has been experiencing progressively worsening shortness of breath over the last few weeks.  States that she was diagnosed with COVID about 3 weeks ago and was treated with antibiotics and steroids.  She has persistently felt fatigued and short of breath since her diagnosis.  She also reports episodes of palpitations and "fluttering" over the last few weeks.  She denies any chest pain.  Yesterday morning she reports that while walking to her bathroom and back to her bedroom she became extremely short of breath and thus called EMS, she was brought to the ED for further evaluation.  Workup in the ED notable for creatinine 1.16, potassium 4.2, hemoglobin 14.2.  BNP 289.  Troponins trended 8 > 7 > 7.  EKG demonstrated atrial fibrillation RVR.  CT chest concerning for bronchitis with likely mucous secretions in bilateral lower lobe bronchi.  She was given IV diuretics in the ED and started on supplemental O2.  At the time of my evaluation this afternoon the patient is resting comfortably in bedside chair.  States that her shortness of breath is mildly improved.  Overall feels that her palpitation symptoms are improved.  She was started on diltiazem infusion for rate control.  She has been weaned to room air.  She also endorses some recent lower extremity edema, she states that she has had issues with leg  swelling for many years and takes lasix as needed at home.  Review of systems complete and found to be negative unless listed above    Past Medical History:  Diagnosis Date   Anemia    Anxiety    Aortic stenosis, mild    Arthritis    CKD (chronic kidney disease), stage III (HCC)    Complication of anesthesia    Propofol anaphylaxis   Cortical cataract    DDD (degenerative disc disease), lumbar    Depression    Dyspnea    GERD (gastroesophageal reflux disease)    Headache    Heart murmur    History of 2019 novel coronavirus disease (COVID-19) 12/01/2018   HLD (hyperlipidemia)    Hypertension    Pneumonia    Schatzki's ring    Seasonal allergies    T2DM (type 2 diabetes mellitus) (HCC)    Valvular insufficiency     Past Surgical History:  Procedure Laterality Date   ABDOMINAL HYSTERECTOMY     APPENDECTOMY     BICEPT TENODESIS Right 05/13/2018   Procedure: BICEPS TENODESIS;  Surgeon: Christena Flake, MD;  Location: [facility] ORS;  Service: Orthopedics;  Laterality: Right;   CARDIAC CATHETERIZATION     COLONOSCOPY     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   EYE SURGERY Right 09/2013   Corneal transplant   JOINT REPLACEMENT Right 2015   knee   REVERSE SHOULDER ARTHROPLASTY Left 10/13/2020   Procedure: REVERSE SHOULDER ARTHROPLASTY;  Surgeon: Christena Flake, MD;  Location: [facility] ORS;  Service: Orthopedics;  Irregularly irregular, fast rate. Normal S1 and S2 without gallops or murmurs.  Abdomen: Non-distended appearing.  Msk: Normal strength and tone for age. Extremities: Warm and well perfused. No clubbing, cyanosis. No edema.  Neuro: Alert and oriented X 3. Psych: Answers questions  appropriately.   Labs: Basic Metabolic Panel: Recent Labs    01/30/23 0837 01/31/23 0506  NA 139 130*  K 4.2 4.0  CL 103 94*  CO2 23 20*  GLUCOSE 195* 382*  BUN 20 41*  CREATININE 1.16* 1.63*  CALCIUM 9.1 9.0   Liver Function Tests: Recent Labs    01/30/23 0837  AST 24  ALT 19  ALKPHOS 52  BILITOT 1.0  PROT 7.5  ALBUMIN 3.9   No results for input(s): "LIPASE", "AMYLASE" in the last 72 hours. CBC: Recent Labs    01/30/23 0837 01/31/23 0506  WBC 10.3 12.4*  HGB 14.2 14.4  HCT 43.8 43.5  MCV 89.4 87.7  PLT 248 248   Cardiac Enzymes: Recent Labs    01/30/23 0837 01/30/23 1506 01/30/23 1646  TROPONINIHS 8 7 7    BNP: Recent Labs    01/30/23 0837 01/31/23 0506  BNP 289.9* 456.1*   D-Dimer: No results for input(s): "DDIMER" in the last 72 hours. Hemoglobin A1C: Recent Labs    01/31/23 0506  HGBA1C 7.6*   Fasting Lipid Panel: No results for input(s): "CHOL", "HDL", "LDLCALC", "TRIG", "CHOLHDL", "LDLDIRECT" in the last 72 hours. Thyroid Function Tests: Recent Labs    01/30/23 0837  TSH 4.844*   Anemia Panel: No results for input(s): "VITAMINB12", "FOLATE", "FERRITIN", "TIBC", "IRON", "RETICCTPCT" in the last 72 hours.   Radiology: ECHOCARDIOGRAM COMPLETE  Result Date: 01/31/2023    ECHOCARDIOGRAM REPORT   Patient Name:   Samantha Freeman Date of Exam: 01/30/2023 Medical Rec #:  [id number]       Height:       64.0 in Accession #:    [id number]      Weight:       184.0 lb Date of Birth:  1936-04-05        BSA:          1.888 m Patient Age:    87 years        BP:           131/90 mmHg Patient Gender: F               HR:           133 bpm. Exam Location:  [facility] Procedure: 2D Echo, Cardiac Doppler and Color Doppler Indications:     R91.31 Abnormal EKG  History:         Patient has no prior history of Echocardiogram examinations.                  Signs/Symptoms:Dyspnea and Murmur; Risk Factors:Hypertension,                  Diabetes and Dyslipidemia. COVID-19.  Pneumonia.  Sonographer:     Daphine Deutscher RDCS Referring Phys:  [id number] [facility] Diagnosing Phys: Marcina Millard MD IMPRESSIONS  1. Left ventricular ejection fraction, by estimation, is 60 to 65%. The left ventricle has normal function. The left ventricle has no regional wall motion abnormalities. There is moderate left ventricular hypertrophy. Indeterminate diastolic filling due  to E-A fusion.  2. Right ventricular systolic function is normal. The right ventricular size is normal.  3. Left atrial size was moderately dilated.  4. Right atrial size was mildly  Irregularly irregular, fast rate. Normal S1 and S2 without gallops or murmurs.  Abdomen: Non-distended appearing.  Msk: Normal strength and tone for age. Extremities: Warm and well perfused. No clubbing, cyanosis. No edema.  Neuro: Alert and oriented X 3. Psych: Answers questions  appropriately.   Labs: Basic Metabolic Panel: Recent Labs    01/30/23 0837 01/31/23 0506  NA 139 130*  K 4.2 4.0  CL 103 94*  CO2 23 20*  GLUCOSE 195* 382*  BUN 20 41*  CREATININE 1.16* 1.63*  CALCIUM 9.1 9.0   Liver Function Tests: Recent Labs    01/30/23 0837  AST 24  ALT 19  ALKPHOS 52  BILITOT 1.0  PROT 7.5  ALBUMIN 3.9   No results for input(s): "LIPASE", "AMYLASE" in the last 72 hours. CBC: Recent Labs    01/30/23 0837 01/31/23 0506  WBC 10.3 12.4*  HGB 14.2 14.4  HCT 43.8 43.5  MCV 89.4 87.7  PLT 248 248   Cardiac Enzymes: Recent Labs    01/30/23 0837 01/30/23 1506 01/30/23 1646  TROPONINIHS 8 7 7    BNP: Recent Labs    01/30/23 0837 01/31/23 0506  BNP 289.9* 456.1*   D-Dimer: No results for input(s): "DDIMER" in the last 72 hours. Hemoglobin A1C: Recent Labs    01/31/23 0506  HGBA1C 7.6*   Fasting Lipid Panel: No results for input(s): "CHOL", "HDL", "LDLCALC", "TRIG", "CHOLHDL", "LDLDIRECT" in the last 72 hours. Thyroid Function Tests: Recent Labs    01/30/23 0837  TSH 4.844*   Anemia Panel: No results for input(s): "VITAMINB12", "FOLATE", "FERRITIN", "TIBC", "IRON", "RETICCTPCT" in the last 72 hours.   Radiology: ECHOCARDIOGRAM COMPLETE  Result Date: 01/31/2023    ECHOCARDIOGRAM REPORT   Patient Name:   Samantha Freeman Date of Exam: 01/30/2023 Medical Rec #:  [id number]       Height:       64.0 in Accession #:    [id number]      Weight:       184.0 lb Date of Birth:  1936-04-05        BSA:          1.888 m Patient Age:    87 years        BP:           131/90 mmHg Patient Gender: F               HR:           133 bpm. Exam Location:  [facility] Procedure: 2D Echo, Cardiac Doppler and Color Doppler Indications:     R91.31 Abnormal EKG  History:         Patient has no prior history of Echocardiogram examinations.                  Signs/Symptoms:Dyspnea and Murmur; Risk Factors:Hypertension,                  Diabetes and Dyslipidemia. COVID-19.  Pneumonia.  Sonographer:     Daphine Deutscher RDCS Referring Phys:  [id number] [facility] Diagnosing Phys: Marcina Millard MD IMPRESSIONS  1. Left ventricular ejection fraction, by estimation, is 60 to 65%. The left ventricle has normal function. The left ventricle has no regional wall motion abnormalities. There is moderate left ventricular hypertrophy. Indeterminate diastolic filling due  to E-A fusion.  2. Right ventricular systolic function is normal. The right ventricular size is normal.  3. Left atrial size was moderately dilated.  4. Right atrial size was mildly  [facility]INIC CARDIOLOGY CONSULT NOTE       Patient ID: Samantha Freeman MRN: [id number] DOB/AGE: 09/28/1935 87 y.o.  Admit date: 01/30/2023 Referring Physician Dr. Jonah Blue Primary Physician Dr. Judithann Sheen Primary Cardiologist Dr. Darrold Junker Reason for Consultation atrial fibrillation RVR  HPI: Samantha Freeman is a 87 y.o. female  with a past medical history of mild aortic stenosis, bradycardia, type 2 diabetes, hypercholesterolemia who presented to the ED on 01/30/2023 for SOB. Also noted to be in new onset atrial fibrillation. Cardiology was consulted for further evaluation.   Patient reports that she has been experiencing progressively worsening shortness of breath over the last few weeks.  States that she was diagnosed with COVID about 3 weeks ago and was treated with antibiotics and steroids.  She has persistently felt fatigued and short of breath since her diagnosis.  She also reports episodes of palpitations and "fluttering" over the last few weeks.  She denies any chest pain.  Yesterday morning she reports that while walking to her bathroom and back to her bedroom she became extremely short of breath and thus called EMS, she was brought to the ED for further evaluation.  Workup in the ED notable for creatinine 1.16, potassium 4.2, hemoglobin 14.2.  BNP 289.  Troponins trended 8 > 7 > 7.  EKG demonstrated atrial fibrillation RVR.  CT chest concerning for bronchitis with likely mucous secretions in bilateral lower lobe bronchi.  She was given IV diuretics in the ED and started on supplemental O2.  At the time of my evaluation this afternoon the patient is resting comfortably in bedside chair.  States that her shortness of breath is mildly improved.  Overall feels that her palpitation symptoms are improved.  She was started on diltiazem infusion for rate control.  She has been weaned to room air.  She also endorses some recent lower extremity edema, she states that she has had issues with leg  swelling for many years and takes lasix as needed at home.  Review of systems complete and found to be negative unless listed above    Past Medical History:  Diagnosis Date   Anemia    Anxiety    Aortic stenosis, mild    Arthritis    CKD (chronic kidney disease), stage III (HCC)    Complication of anesthesia    Propofol anaphylaxis   Cortical cataract    DDD (degenerative disc disease), lumbar    Depression    Dyspnea    GERD (gastroesophageal reflux disease)    Headache    Heart murmur    History of 2019 novel coronavirus disease (COVID-19) 12/01/2018   HLD (hyperlipidemia)    Hypertension    Pneumonia    Schatzki's ring    Seasonal allergies    T2DM (type 2 diabetes mellitus) (HCC)    Valvular insufficiency     Past Surgical History:  Procedure Laterality Date   ABDOMINAL HYSTERECTOMY     APPENDECTOMY     BICEPT TENODESIS Right 05/13/2018   Procedure: BICEPS TENODESIS;  Surgeon: Christena Flake, MD;  Location: [facility] ORS;  Service: Orthopedics;  Laterality: Right;   CARDIAC CATHETERIZATION     COLONOSCOPY     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   EYE SURGERY Right 09/2013   Corneal transplant   JOINT REPLACEMENT Right 2015   knee   REVERSE SHOULDER ARTHROPLASTY Left 10/13/2020   Procedure: REVERSE SHOULDER ARTHROPLASTY;  Surgeon: Christena Flake, MD;  Location: [facility] ORS;  Service: Orthopedics;

## 2023-02-01 ENCOUNTER — Other Ambulatory Visit: Payer: Self-pay

## 2023-02-01 DIAGNOSIS — J9601 Acute respiratory failure with hypoxia: Secondary | ICD-10-CM | POA: Diagnosis not present

## 2023-02-01 LAB — BASIC METABOLIC PANEL
Anion gap: 14 (ref 5–15)
BUN: 59 mg/dL — ABNORMAL HIGH (ref 8–23)
CO2: 22 mmol/L (ref 22–32)
Calcium: 9.4 mg/dL (ref 8.9–10.3)
Chloride: 92 mmol/L — ABNORMAL LOW (ref 98–111)
Creatinine, Ser: 1.4 mg/dL — ABNORMAL HIGH (ref 0.44–1.00)
GFR, Estimated: 37 mL/min — ABNORMAL LOW (ref >60)
Glucose, Bld: 429 mg/dL — ABNORMAL HIGH (ref 70–99)
Potassium: 4.6 mmol/L (ref 3.5–5.1)
Sodium: 128 mmol/L — ABNORMAL LOW (ref 135–145)

## 2023-02-01 LAB — GLUCOSE, CAPILLARY
Glucose-Capillary: 489 mg/dL — ABNORMAL HIGH (ref 70–99)
Glucose-Capillary: 470 mg/dL — ABNORMAL HIGH (ref 70–99)
Glucose-Capillary: 275 mg/dL — ABNORMAL HIGH (ref 70–99)
Glucose-Capillary: 217 mg/dL — ABNORMAL HIGH (ref 70–99)

## 2023-02-01 LAB — CBC WITH DIFFERENTIAL/PLATELET
Abs Immature Granulocytes: 0.14 10*3/uL — ABNORMAL HIGH (ref 0.00–0.07)
Basophils Absolute: 0 10*3/uL (ref 0.0–0.1)
Basophils Relative: 0 %
Eosinophils Absolute: 0 10*3/uL (ref 0.0–0.5)
Eosinophils Relative: 0 %
HCT: 39.9 % (ref 36.0–46.0)
Hemoglobin: 13.7 g/dL (ref 12.0–15.0)
Immature Granulocytes: 1 %
Lymphocytes Relative: 7 %
Lymphs Abs: 1 10*3/uL (ref 0.7–4.0)
MCH: 29.5 pg (ref 26.0–34.0)
MCHC: 34.3 g/dL (ref 30.0–36.0)
MCV: 86 fL (ref 80.0–100.0)
Monocytes Absolute: 0.6 10*3/uL (ref 0.1–1.0)
Monocytes Relative: 5 %
Neutro Abs: 12.6 10*3/uL — ABNORMAL HIGH (ref 1.7–7.7)
Neutrophils Relative %: 87 %
Platelets: 241 10*3/uL (ref 150–400)
RBC: 4.64 MIL/uL (ref 3.87–5.11)
RDW: 13.7 % (ref 11.5–15.5)
WBC: 14.4 10*3/uL — ABNORMAL HIGH (ref 4.0–10.5)
nRBC: 0 % (ref 0.0–0.2)

## 2023-02-01 MED ORDER — INSULIN ASPART 100 UNIT/ML IJ SOLN
20.0000 [IU] | Freq: Once | INTRAMUSCULAR | Status: AC
  Administered 2023-02-01: 20 [IU] via SUBCUTANEOUS
  Filled 2023-02-01: qty 1

## 2023-02-01 MED ORDER — INSULIN ASPART 100 UNIT/ML IJ SOLN
20.0000 [IU] | Freq: Three times a day (TID) | INTRAMUSCULAR | Status: DC
  Administered 2023-02-01 – 2023-02-04 (×10): 20 [IU] via SUBCUTANEOUS
  Filled 2023-02-01 (×10): qty 1

## 2023-02-01 MED ORDER — APIXABAN 5 MG PO TABS
5.0000 mg | ORAL_TABLET | Freq: Two times a day (BID) | ORAL | Status: DC
  Administered 2023-02-01 – 2023-02-04 (×7): 5 mg via ORAL
  Filled 2023-02-01 (×7): qty 1

## 2023-02-01 MED ORDER — ENALAPRIL MALEATE 20 MG PO TABS
20.0000 mg | ORAL_TABLET | Freq: Every day | ORAL | Status: DC
  Filled 2023-02-01 (×3): qty 1

## 2023-02-01 MED ORDER — INSULIN ASPART 100 UNIT/ML IJ SOLN: 20.0000 [IU] | Freq: Three times a day (TID) | INTRAMUSCULAR | Status: DC

## 2023-02-01 MED ORDER — ELIQUIS 5 MG PO TABS
5.0000 mg | ORAL_TABLET | Freq: Two times a day (BID) | ORAL | 0 refills | Status: DC
  Filled 2023-02-01: qty 60, 30d supply, fill #0

## 2023-02-01 MED ORDER — DILTIAZEM HCL ER COATED BEADS 180 MG PO CP24
180.0000 mg | ORAL_CAPSULE | Freq: Every day | ORAL | Status: DC
  Administered 2023-02-01 – 2023-02-04 (×4): 180 mg via ORAL
  Filled 2023-02-01 (×4): qty 1

## 2023-02-01 NOTE — Inpatient Diabetes Management (Signed)
Inpatient Diabetes Program Recommendations  AACE/ADA: New Consensus Statement on Inpatient Glycemic Control (2015)  Target Ranges:  Prepandial:   less than 140 mg/dL      Peak postprandial:   less than 180 mg/dL (1-2 hours)      Critically ill patients:  140 - 180 mg/dL   Lab Results  Component Value Date   GLUCAP 489 (H) 02/01/2023   HGBA1C 7.6 (H) 01/31/2023    Latest Reference Range & Units 01/31/23 08:33 01/31/23 12:05 01/31/23 16:44 01/31/23 20:11 02/01/23 08:10  Glucose-Capillary 70 - 99 mg/dL 098 (H) 119 (H) 147 (H) 407 (H) 489 (H)  (H): Data is abnormally high  Review of Glycemic Control  Diabetes history: DM2 Outpatient Diabetes medications: Lantus 40 units at bedtime, Humalog 10-30 units TID with, Farxiga 10 mg daily Current orders for Inpatient glycemic control: Semglee 40 units at bedtime, Novolog 0-15 units TID, Novolog 0-5 units at bedtime  Inpatient Diabetes Program Recommendations:   Noted last dose of steroids yesterday @ 0959 am. Please consider: -Increase Novolog meal coverage to 10 units tid if eats 50% meals  Thank you, Darel Hong E. Liona Wengert, RN, MSN, CDE  Diabetes Coordinator Inpatient Glycemic Control Team Team Pager [phone] (8am-5pm) 02/01/2023 10:21 AM

## 2023-02-01 NOTE — Progress Notes (Signed)
Progress Note   Patient: Samantha Freeman NWG:[id number] DOB: 05/20/1935 DOA: 01/30/2023     2 DOS: the patient was seen and examined on 02/01/2023   Brief hospital course: 87yo with h/o class 1 obesity, DM, HTN, HLD, possible long COVID syndrome, and CKD who presented on 10/9 with SOB. She tested positive for COVID with symptoms 2 months ago and then had 2 subsequent URIs about 3 weeks apart.  She was treated with amoxil and steroids.   O2 83% on RA, placed on 4L [place] O2.  CTA with bronchitis.  Patient is DNR/DNI.  She was treated with steroids, bronchodilators, Mucinex, and 1 dose of Lasix.  She was subsequently noted to be in afib, which was thought to be related to her symptoms.  She was started on Cardizem drip and full-dose Lovenox.  Assessment and Plan:  Acute respiratory failure with hypoxia, resolved Presented with several days of progressively worsening shortness of breath, not responding to Augmentin and prednisone as an outpatient Patient reports her symptoms started after her diagnosis of COVID 2 months ago. Noted to be hypoxic on ambulation requiring 4 L oxygen. On presenting exam had bilateral crackles, no wheezing No fever, WBC count normal, procalcitonin level normal, elevated BNP CT chest findings suggestive of acute bronchitis, mucous plugs as well as right heart strain/failure. Patient's symptoms were thought to be secondary to a combination of reactive airway disease as well as CHF. Given 1 dose of IV Solu-Medrol in the ED and Solu-Medrol IV 40 mg daily  Does not use oxygen at home She was subsequently found to be in afib with RVR and was given a dose of Lasix with rate control She had subsequently had resolution in respiratory symptoms and has remained comfortable on room air Given resolution of symptoms, steroids were stopped   New onset afib Symptoms above appear to be related to this issue Since the afib onset is unknown, will focus on rate control and searching for the  underlying cause at this time. Admitted to progressive for Diltiazem drip as per protocol and has transitioned to PO Diltiazem with sustained heart rate control Echocardiogram with preserved EF, indeterminate diastolic filling Cardiology consulting CHA2DS2-VASc Score is >2 and so patient would benefit from oral anticoagulation. There is evidence of net benefit even in the extremely elderly population. She is adamantly opposed to Coumadin, but reports that she will have difficulty affording Eliquis and is likely to need medication assistance Eliquis voucher provided for 30 day supply and cardiology/pharmacy are working with her on medication assistance paperwork   Possible CHF exacerbation Although no prior h/o of CHF, patient reports she used to be on Lasix in the past for pedal edema. Had tachycardia, bilateral crackles on auscultation and BNP elevated on presentation CT chest showed reflux of contrast in the intrahepatic IVC and central intrahepatic veins favoring right heart strain/failure. Given 1 dose of Lasix IV 40 mg Updated echocardiogram without frank systolic or diastolic CHF Symptoms have resolved so anticipate that volume overload was related to afib with RVR rather than to heart failure Follows up with cardiologist Dr. Darrold Junker, who has been consulting   Essential hypertension Stop amlodipine due to need for diltiazem Enalapril, Propranolol were resumed as needed with increasing BP   Type 2 diabetes mellitus A1c is 7.6 Significant hyperglycemia thus far during hospitalization, likely due to steroids received Resumed glargine 40 units daily Covering with increasing doses of mealtime insulin as well as moderate-scale SSI Hold Farxiga Continue Neurontin   HLD Continue fenofibrate,  rosuvastatin   Abnormal TSH TSH slightly elevated to 4.8, normal free T4 - likely sick euthyroid   AKI on stage 3a CKD Appears to have worsened creatinine that is likely associated with  diuresis as well as diminished renal perfusion in the setting of RVR Holding diuresis with improvement Recheck in AM   Anxiety/depression Continue Ativan, hydroxyzine   Class 1 obesity Body mass index is 31.58 kg/m.Marland Kitchen  Weight loss should be encouraged Outpatient PCP/bariatric medicine/bariatric surgery f/u encouraged   Generalized weakness Patient lives alone and is concerned about her ability to be successful at home  She acknowledges probable need for SNF rehab PT/OT consults She is now medically stable and awaiting placement      Consultants: Cardiology   Procedures: Echocardiogram 10/9   Antibiotics: None    30 Day Unplanned Readmission Risk Score    Flowsheet Row ED to Hosp-Admission (Current) from 01/30/2023 in [facility]  30 Day Unplanned Readmission Risk Score (%) 15.56 Filed at 02/01/2023 0801       This score is the patient's risk of an unplanned readmission within 30 days of being discharged (0 -100%). The score is based on dignosis, age, lab data, medications, orders, and past utilization.   Low:  0-14.9   Medium: 15-21.9   High: 22-29.9   Extreme: 30 and above           Subjective: Feels generally weak without additional complaints.   Objective: Vitals:   02/01/23 0808 02/01/23 1201  BP: 132/79 111/77  Pulse: 99 81  Resp:    Temp: 98 F (36.7 C) 98 F (36.7 C)  SpO2: 94% 93%    Intake/Output Summary (Last 24 hours) at 02/01/2023 1458 Last data filed at 02/01/2023 1417 Gross per 24 hour  Intake 720 ml  Output 750 ml  Net -30 ml   Filed Weights   01/30/23 0805 02/01/23 0419  Weight: 83.5 kg 88.7 kg    Exam:  General:  Appears calm and comfortable and is in NAD, in bedside chair, on RA Eyes:  EOMI, normal lids, iris ENT:  grossly normal hearing, lips & tongue, mmm Neck:  no LAD, masses or thyromegaly Cardiovascular:  Irregularly rhythm with rate in 80s. No LE edema.  Respiratory:   CTA bilaterally with no  wheezes/rales/rhonchi.  Normal respiratory effort. Abdomen:  soft, NT, ND Skin:  no rash or induration seen on limited exam Musculoskeletal:  grossly normal tone BUE/BLE, good ROM, no bony abnormality Psychiatric:  grossly normal mood and affect, speech fluent and appropriate, AOx3 Neurologic:  CN 2-12 grossly intact, moves all extremities in coordinated fashion  Data Reviewed: I have reviewed the patient's lab results since admission.  Pertinent labs for today include:   Na++ 128 Glucose 429 BUN 59/Creatinine 1.4/GFR 37 - improved WBC 14.4     Family Communication: None present  Disposition: Status is: Inpatient Remains inpatient appropriate because: unsafe disposition     Time spent: 50 minutes  Unresulted Labs (From admission, onward)     Start     Ordered   02/02/23 0500  CBC with Differential/Platelet  Tomorrow morning,   R       Question:  Specimen collection method  Answer:  Lab=Lab collect   02/01/23 1458   02/02/23 0500  Basic metabolic panel  Tomorrow morning,   R       Question:  Specimen collection method  Answer:  Lab=Lab collect   02/01/23 1458  Author: Jonah Blue, MD 02/01/2023 2:58 PM  For on call review [email or web address].

## 2023-02-01 NOTE — Consult Note (Signed)
Apixaban filled at [facility] and delivered to the patient.    Paschal Dopp, PharmD, BCPS

## 2023-02-01 NOTE — Progress Notes (Signed)
Physical Therapy Treatment Patient Details Name: Samantha Freeman MRN: [id number] DOB: Mar 14, 1936 Today's Date: 02/01/2023   History of Present Illness Patient presented to the ED 01/30/23 with complaint of progressively worsening shortness of breath for last several days.  PMH significant for obesity, DM2, HTN, HLD, sinus bradycardia, CKD, GERD/Schatzki's ring, anemia, anxiety/depression, vascular insufficiency, COVID pneumonia.    PT Comments  Patient sitting in recliner on arrival and agreeable to PT session. Patient able to come into standing with CGA with minor LOB upon standing. Ambulated 20' with HHAx1 and minA for balance. Introduced use of Rw for last 25' with CGA and improved balance. SpO2 down to 89% with 2 standing rest breaks. Updated discharge plan as patient unable to safely be home alone and requires assistance and frequent supervision for safety due to current functional level.     If plan is discharge home, recommend the following: A little help with walking and/or transfers;A little help with bathing/dressing/bathroom;Assistance with cooking/housework;Assist for transportation   Can travel by private vehicle     Yes  Equipment Recommendations  None recommended by PT    Recommendations for Other Services       Precautions / Restrictions Precautions Precautions: Fall Restrictions Weight Bearing Restrictions: No     Mobility  Bed Mobility               General bed mobility comments: in recliner on arrival and at end of session    Transfers Overall transfer level: Needs assistance Equipment used: None Transfers: Sit to/from Stand Sit to Stand: Contact guard assist           General transfer comment: CGA for safety. minor LOB upon standing    Ambulation/Gait Ambulation/Gait assistance: Contact guard assist, Min assist Gait Distance (Feet): 90 Feet Assistive device: 1 person hand held assist, Rolling Skie Vitrano (2 wheels) Gait Pattern/deviations:  Step-through pattern, Decreased step length - right, Decreased step length - left, Decreased stride length Gait velocity: decreased     General Gait Details: minA required at times for patient with HHAx1. Introduced RW with patient requiring CGA for mobility. 2 standing rest breaks required with spO2 89%   Stairs             Wheelchair Mobility     Tilt Bed    Modified Rankin (Stroke Patients Only)       Balance Overall balance assessment: Needs assistance Sitting-balance support: Feet supported Sitting balance-Leahy Scale: Good     Standing balance support: Bilateral upper extremity supported, During functional activity, Reliant on assistive device for balance Standing balance-Leahy Scale: Fair                              Cognition Arousal: Alert Behavior During Therapy: WFL for tasks assessed/performed Overall Cognitive Status: Within Functional Limits for tasks assessed                                          Exercises      General Comments General comments (skin integrity, edema, etc.): SpO2 drops from mid 90's to 89-90% on room air within ~19min      Pertinent Vitals/Pain Pain Assessment Pain Assessment: No/denies pain    Home Living Family/patient expects to be discharged to:: Private residence Living Arrangements: Alone Available Help at Discharge: Family;Available PRN/intermittently Type of Home: House Home Access: Level  entry       Home Layout: One level Home Equipment: Cane - single point;Standard Environmental consultant;Shower seat - built in;Grab bars - tub/shower;Hand held shower head      Prior Function            PT Goals (current goals can now be found in the care plan section) Acute Rehab PT Goals Patient Stated Goal: to return home, improve strength PT Goal Formulation: With patient Time For Goal Achievement: 02/14/23 Potential to Achieve Goals: Good Progress towards PT goals: Progressing toward goals     Frequency    Min 1X/week      PT Plan      Co-evaluation              AM-PAC PT "6 Clicks" Mobility   Outcome Measure  Help needed turning from your back to your side while in a flat bed without using bedrails?: A Little Help needed moving from lying on your back to sitting on the side of a flat bed without using bedrails?: A Little Help needed moving to and from a bed to a chair (including a wheelchair)?: A Little Help needed standing up from a chair using your arms (e.g., wheelchair or bedside chair)?: A Little Help needed to walk in hospital room?: A Little Help needed climbing 3-5 steps with a railing? : A Lot 6 Click Score: 17    End of Session   Activity Tolerance: Patient limited by fatigue Patient left: in chair;with call bell/phone within reach Nurse Communication: Mobility status PT Visit Diagnosis: Unsteadiness on feet (R26.81);Muscle weakness (generalized) (M62.81);Difficulty in walking, not elsewhere classified (R26.2)     Time: 3664-4034 PT Time Calculation (min) (ACUTE ONLY): 12 min  Charges:    $Therapeutic Activity: 8-22 mins PT General Charges $$ ACUTE PT VISIT: 1 Visit                     Maylon Peppers, PT, DPT Physical Therapist - [facility]  [facility]    Samantha Freeman A Samantha Freeman 02/01/2023, 12:00 PM

## 2023-02-01 NOTE — Progress Notes (Signed)
[facility] CLINIC CARDIOLOGY CONSULT NOTE       Patient ID: Samantha Freeman MRN: [id number] DOB/AGE: 1936-02-23 87 y.o.  Admit date: 01/30/2023 Referring Physician Dr. Jonah Blue Primary Physician Dr. Judithann Sheen Primary Cardiologist Dr. Darrold Junker Reason for Consultation atrial fibrillation RVR  HPI: Samantha Freeman is a 87 y.o. female  with a past medical history of mild aortic stenosis, bradycardia, type 2 diabetes, hypercholesterolemia who presented to the ED on 01/30/2023 for SOB. Also noted to be in new onset atrial fibrillation. Cardiology was consulted for further evaluation.   Interval history: -HR much better controlled this AM. Denies any chest pain or palpitations. -Patient reports feeling better overall. -Continues to report weakness, worked with PT yesterday.   Review of systems complete and found to be negative unless listed above    Past Medical History:  Diagnosis Date   Anemia    Anxiety    Aortic stenosis, mild    Arthritis    CKD (chronic kidney disease), stage III (HCC)    Complication of anesthesia    Propofol anaphylaxis   Cortical cataract    DDD (degenerative disc disease), lumbar    Depression    Dyspnea    GERD (gastroesophageal reflux disease)    Headache    Heart murmur    History of 2019 novel coronavirus disease (COVID-19) 12/01/2018   HLD (hyperlipidemia)    Hypertension    Pneumonia    Schatzki's ring    Seasonal allergies    T2DM (type 2 diabetes mellitus) (HCC)    Valvular insufficiency     Past Surgical History:  Procedure Laterality Date   ABDOMINAL HYSTERECTOMY     APPENDECTOMY     BICEPT TENODESIS Right 05/13/2018   Procedure: BICEPS TENODESIS;  Surgeon: Christena Flake, MD;  Location: [facility] ORS;  Service: Orthopedics;  Laterality: Right;   CARDIAC CATHETERIZATION     COLONOSCOPY     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   EYE SURGERY Right 09/2013   Corneal transplant   JOINT REPLACEMENT Right 2015   knee   REVERSE  SHOULDER ARTHROPLASTY Left 10/13/2020   Procedure: REVERSE SHOULDER ARTHROPLASTY;  Surgeon: Christena Flake, MD;  Location: [facility] ORS;  Service: Orthopedics;  Laterality: Left;   SHOULDER ARTHROSCOPY WITH ROTATOR CUFF REPAIR Right 05/13/2018   Procedure: SHOULDER ARTHROSCOPY WITH DEBRIDEMENT, DECOMPRESSION AND ROTATOR CUFF REPAIR;  Surgeon: Christena Flake, MD;  Location: [facility] ORS;  Service: Orthopedics;  Laterality: Right;   TONSILLECTOMY      Medications Prior to Admission  Medication Sig Dispense Refill Last Dose   ALPRAZolam (XANAX) 0.5 MG tablet Take 0.5 mg by mouth at bedtime as needed for sleep.    01/29/2023   amLODipine (NORVASC) 10 MG tablet Take 1 tablet (10 mg total) by mouth daily. 30 tablet 0 01/29/2023   aspirin EC 81 MG tablet Take 81 mg by mouth every other day.   01/29/2023   Calcium Carb-Cholecalciferol (CALCIUM 600/VITAMIN D3 PO) Take 1 tablet by mouth daily.   01/29/2023   Coenzyme Q10 10 MG capsule Take 10 mg by mouth every morning.   01/29/2023   dapagliflozin propanediol (FARXIGA) 10 MG TABS tablet Take 10 mg by mouth daily.   01/29/2023   enalapril (VASOTEC) 20 MG tablet Take 20 mg by mouth 2 (two) times daily.   01/29/2023   fenofibrate (TRICOR) 48 MG tablet Take 1 tablet by mouth daily.   01/29/2023   ferrous sulfate 325 (65 FE) MG tablet Take 325 mg  of Food in the Last Year: Never true    Ran Out of Food in the Last Year: Never true  Transportation Needs: No Transportation Needs (01/30/2023)   PRAPARE - Administrator, Civil Service (Medical): No    Lack of Transportation (Non-Medical): No  Physical Activity: Not on file  Stress: Not on file  Social Connections: Not on file  Intimate Partner Violence: Not At Risk (01/30/2023)   Humiliation, Afraid, Rape, and Kick questionnaire    Fear of Current or Ex-Partner: No    Emotionally Abused: No    Physically Abused: No    Sexually Abused: No    Family History  Problem Relation Age of Onset   Breast cancer Paternal Aunt      Vitals:   02/01/23 0111 02/01/23 0400 02/01/23 0419 02/01/23 0808  BP: (!) 120/94 111/65  132/79  Pulse:    99  Resp: 18 20    Temp:    98 F (36.7 C)  TempSrc:    Oral  SpO2: 97%   94%  Weight:   88.7 kg   Height:        PHYSICAL EXAM General: Well appearing elderly female, well nourished, in no acute distress. HEENT: Normocephalic and atraumatic. Neck: No JVD.  Lungs: Normal respiratory effort on room air. Clear bilaterally to auscultation. No wheezes, crackles, rhonchi.  Heart: Irregularly irregular, controlled rate. Normal S1 and S2 without gallops or murmurs.  Abdomen: Non-distended appearing.  Msk: Normal strength and tone for age. Extremities: Warm and well perfused. No clubbing, cyanosis. No edema.  Neuro: Alert and oriented X 3. Psych: Answers questions  appropriately.   Labs: Basic Metabolic Panel: Recent Labs    01/31/23 0506 02/01/23 0404  NA 130* 128*  K 4.0 4.6  CL 94* 92*  CO2 20* 22  GLUCOSE 382* 429*  BUN 41* 59*  CREATININE 1.63* 1.40*  CALCIUM 9.0 9.4   Liver Function Tests: Recent Labs    01/30/23 0837  AST 24  ALT 19  ALKPHOS 52  BILITOT 1.0  PROT 7.5  ALBUMIN 3.9   No results for input(s): "LIPASE", "AMYLASE" in the last 72 hours. CBC: Recent Labs    01/31/23 0506 02/01/23 0404  WBC 12.4* 14.4*  NEUTROABS  --  12.6*  HGB 14.4 13.7  HCT 43.5 39.9  MCV 87.7 86.0  PLT 248 241   Cardiac Enzymes: Recent Labs    01/30/23 0837 01/30/23 1506 01/30/23 1646  TROPONINIHS 8 7 7    BNP: Recent Labs    01/30/23 0837 01/31/23 0506  BNP 289.9* 456.1*   D-Dimer: No results for input(s): "DDIMER" in the last 72 hours. Hemoglobin A1C: Recent Labs    01/31/23 0506  HGBA1C 7.6*   Fasting Lipid Panel: No results for input(s): "CHOL", "HDL", "LDLCALC", "TRIG", "CHOLHDL", "LDLDIRECT" in the last 72 hours. Thyroid Function Tests: Recent Labs    01/30/23 0837  TSH 4.844*   Anemia Panel: No results for input(s): "VITAMINB12", "FOLATE", "FERRITIN", "TIBC", "IRON", "RETICCTPCT" in the last 72 hours.   Radiology: ECHOCARDIOGRAM COMPLETE  Result Date: 01/31/2023    ECHOCARDIOGRAM REPORT   Patient Name:   Samantha Freeman Date of Exam: 01/30/2023 Medical Rec #:  [id number]       Height:       64.0 in Accession #:    [id number]      Weight:       184.0 lb Date of Birth:  Sep 17, 1935        BSA:  [facility] CLINIC CARDIOLOGY CONSULT NOTE       Patient ID: Samantha Freeman MRN: [id number] DOB/AGE: 1936-02-23 87 y.o.  Admit date: 01/30/2023 Referring Physician Dr. Jonah Blue Primary Physician Dr. Judithann Sheen Primary Cardiologist Dr. Darrold Junker Reason for Consultation atrial fibrillation RVR  HPI: Samantha Freeman is a 87 y.o. female  with a past medical history of mild aortic stenosis, bradycardia, type 2 diabetes, hypercholesterolemia who presented to the ED on 01/30/2023 for SOB. Also noted to be in new onset atrial fibrillation. Cardiology was consulted for further evaluation.   Interval history: -HR much better controlled this AM. Denies any chest pain or palpitations. -Patient reports feeling better overall. -Continues to report weakness, worked with PT yesterday.   Review of systems complete and found to be negative unless listed above    Past Medical History:  Diagnosis Date   Anemia    Anxiety    Aortic stenosis, mild    Arthritis    CKD (chronic kidney disease), stage III (HCC)    Complication of anesthesia    Propofol anaphylaxis   Cortical cataract    DDD (degenerative disc disease), lumbar    Depression    Dyspnea    GERD (gastroesophageal reflux disease)    Headache    Heart murmur    History of 2019 novel coronavirus disease (COVID-19) 12/01/2018   HLD (hyperlipidemia)    Hypertension    Pneumonia    Schatzki's ring    Seasonal allergies    T2DM (type 2 diabetes mellitus) (HCC)    Valvular insufficiency     Past Surgical History:  Procedure Laterality Date   ABDOMINAL HYSTERECTOMY     APPENDECTOMY     BICEPT TENODESIS Right 05/13/2018   Procedure: BICEPS TENODESIS;  Surgeon: Christena Flake, MD;  Location: [facility];  Service: Orthopedics;  Laterality: Right;   CARDIAC CATHETERIZATION     COLONOSCOPY     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   EYE SURGERY Right 09/2013   Corneal transplant   JOINT REPLACEMENT Right 2015   knee   REVERSE  SHOULDER ARTHROPLASTY Left 10/13/2020   Procedure: REVERSE SHOULDER ARTHROPLASTY;  Surgeon: Christena Flake, MD;  Location: [facility];  Service: Orthopedics;  Laterality: Left;   SHOULDER ARTHROSCOPY WITH ROTATOR CUFF REPAIR Right 05/13/2018   Procedure: SHOULDER ARTHROSCOPY WITH DEBRIDEMENT, DECOMPRESSION AND ROTATOR CUFF REPAIR;  Surgeon: Christena Flake, MD;  Location: [facility];  Service: Orthopedics;  Laterality: Right;   TONSILLECTOMY      Medications Prior to Admission  Medication Sig Dispense Refill Last Dose   ALPRAZolam (XANAX) 0.5 MG tablet Take 0.5 mg by mouth at bedtime as needed for sleep.    01/29/2023   amLODipine (NORVASC) 10 MG tablet Take 1 tablet (10 mg total) by mouth daily. 30 tablet 0 01/29/2023   aspirin EC 81 MG tablet Take 81 mg by mouth every other day.   01/29/2023   Calcium Carb-Cholecalciferol (CALCIUM 600/VITAMIN D3 PO) Take 1 tablet by mouth daily.   01/29/2023   Coenzyme Q10 10 MG capsule Take 10 mg by mouth every morning.   01/29/2023   dapagliflozin propanediol (FARXIGA) 10 MG TABS tablet Take 10 mg by mouth daily.   01/29/2023   enalapril (VASOTEC) 20 MG tablet Take 20 mg by mouth 2 (two) times daily.   01/29/2023   fenofibrate (TRICOR) 48 MG tablet Take 1 tablet by mouth daily.   01/29/2023   ferrous sulfate 325 (65 FE) MG tablet Take 325 mg  [facility] CLINIC CARDIOLOGY CONSULT NOTE       Patient ID: Samantha Freeman MRN: [id number] DOB/AGE: 1936-02-23 87 y.o.  Admit date: 01/30/2023 Referring Physician Dr. Jonah Blue Primary Physician Dr. Judithann Sheen Primary Cardiologist Dr. Darrold Junker Reason for Consultation atrial fibrillation RVR  HPI: Samantha Freeman is a 87 y.o. female  with a past medical history of mild aortic stenosis, bradycardia, type 2 diabetes, hypercholesterolemia who presented to the ED on 01/30/2023 for SOB. Also noted to be in new onset atrial fibrillation. Cardiology was consulted for further evaluation.   Interval history: -HR much better controlled this AM. Denies any chest pain or palpitations. -Patient reports feeling better overall. -Continues to report weakness, worked with PT yesterday.   Review of systems complete and found to be negative unless listed above    Past Medical History:  Diagnosis Date   Anemia    Anxiety    Aortic stenosis, mild    Arthritis    CKD (chronic kidney disease), stage III (HCC)    Complication of anesthesia    Propofol anaphylaxis   Cortical cataract    DDD (degenerative disc disease), lumbar    Depression    Dyspnea    GERD (gastroesophageal reflux disease)    Headache    Heart murmur    History of 2019 novel coronavirus disease (COVID-19) 12/01/2018   HLD (hyperlipidemia)    Hypertension    Pneumonia    Schatzki's ring    Seasonal allergies    T2DM (type 2 diabetes mellitus) (HCC)    Valvular insufficiency     Past Surgical History:  Procedure Laterality Date   ABDOMINAL HYSTERECTOMY     APPENDECTOMY     BICEPT TENODESIS Right 05/13/2018   Procedure: BICEPS TENODESIS;  Surgeon: Christena Flake, MD;  Location: [facility];  Service: Orthopedics;  Laterality: Right;   CARDIAC CATHETERIZATION     COLONOSCOPY     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   EYE SURGERY Right 09/2013   Corneal transplant   JOINT REPLACEMENT Right 2015   knee   REVERSE  SHOULDER ARTHROPLASTY Left 10/13/2020   Procedure: REVERSE SHOULDER ARTHROPLASTY;  Surgeon: Christena Flake, MD;  Location: [facility];  Service: Orthopedics;  Laterality: Left;   SHOULDER ARTHROSCOPY WITH ROTATOR CUFF REPAIR Right 05/13/2018   Procedure: SHOULDER ARTHROSCOPY WITH DEBRIDEMENT, DECOMPRESSION AND ROTATOR CUFF REPAIR;  Surgeon: Christena Flake, MD;  Location: [facility];  Service: Orthopedics;  Laterality: Right;   TONSILLECTOMY      Medications Prior to Admission  Medication Sig Dispense Refill Last Dose   ALPRAZolam (XANAX) 0.5 MG tablet Take 0.5 mg by mouth at bedtime as needed for sleep.    01/29/2023   amLODipine (NORVASC) 10 MG tablet Take 1 tablet (10 mg total) by mouth daily. 30 tablet 0 01/29/2023   aspirin EC 81 MG tablet Take 81 mg by mouth every other day.   01/29/2023   Calcium Carb-Cholecalciferol (CALCIUM 600/VITAMIN D3 PO) Take 1 tablet by mouth daily.   01/29/2023   Coenzyme Q10 10 MG capsule Take 10 mg by mouth every morning.   01/29/2023   dapagliflozin propanediol (FARXIGA) 10 MG TABS tablet Take 10 mg by mouth daily.   01/29/2023   enalapril (VASOTEC) 20 MG tablet Take 20 mg by mouth 2 (two) times daily.   01/29/2023   fenofibrate (TRICOR) 48 MG tablet Take 1 tablet by mouth daily.   01/29/2023   ferrous sulfate 325 (65 FE) MG tablet Take 325 mg  [facility] CLINIC CARDIOLOGY CONSULT NOTE       Patient ID: Samantha Freeman MRN: [id number] DOB/AGE: 1936-02-23 87 y.o.  Admit date: 01/30/2023 Referring Physician Dr. Jonah Blue Primary Physician Dr. Judithann Sheen Primary Cardiologist Dr. Darrold Junker Reason for Consultation atrial fibrillation RVR  HPI: Samantha Freeman is a 87 y.o. female  with a past medical history of mild aortic stenosis, bradycardia, type 2 diabetes, hypercholesterolemia who presented to the ED on 01/30/2023 for SOB. Also noted to be in new onset atrial fibrillation. Cardiology was consulted for further evaluation.   Interval history: -HR much better controlled this AM. Denies any chest pain or palpitations. -Patient reports feeling better overall. -Continues to report weakness, worked with PT yesterday.   Review of systems complete and found to be negative unless listed above    Past Medical History:  Diagnosis Date   Anemia    Anxiety    Aortic stenosis, mild    Arthritis    CKD (chronic kidney disease), stage III (HCC)    Complication of anesthesia    Propofol anaphylaxis   Cortical cataract    DDD (degenerative disc disease), lumbar    Depression    Dyspnea    GERD (gastroesophageal reflux disease)    Headache    Heart murmur    History of 2019 novel coronavirus disease (COVID-19) 12/01/2018   HLD (hyperlipidemia)    Hypertension    Pneumonia    Schatzki's ring    Seasonal allergies    T2DM (type 2 diabetes mellitus) (HCC)    Valvular insufficiency     Past Surgical History:  Procedure Laterality Date   ABDOMINAL HYSTERECTOMY     APPENDECTOMY     BICEPT TENODESIS Right 05/13/2018   Procedure: BICEPS TENODESIS;  Surgeon: Christena Flake, MD;  Location: [facility];  Service: Orthopedics;  Laterality: Right;   CARDIAC CATHETERIZATION     COLONOSCOPY     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   EYE SURGERY Right 09/2013   Corneal transplant   JOINT REPLACEMENT Right 2015   knee   REVERSE  SHOULDER ARTHROPLASTY Left 10/13/2020   Procedure: REVERSE SHOULDER ARTHROPLASTY;  Surgeon: Christena Flake, MD;  Location: [facility];  Service: Orthopedics;  Laterality: Left;   SHOULDER ARTHROSCOPY WITH ROTATOR CUFF REPAIR Right 05/13/2018   Procedure: SHOULDER ARTHROSCOPY WITH DEBRIDEMENT, DECOMPRESSION AND ROTATOR CUFF REPAIR;  Surgeon: Christena Flake, MD;  Location: [facility];  Service: Orthopedics;  Laterality: Right;   TONSILLECTOMY      Medications Prior to Admission  Medication Sig Dispense Refill Last Dose   ALPRAZolam (XANAX) 0.5 MG tablet Take 0.5 mg by mouth at bedtime as needed for sleep.    01/29/2023   amLODipine (NORVASC) 10 MG tablet Take 1 tablet (10 mg total) by mouth daily. 30 tablet 0 01/29/2023   aspirin EC 81 MG tablet Take 81 mg by mouth every other day.   01/29/2023   Calcium Carb-Cholecalciferol (CALCIUM 600/VITAMIN D3 PO) Take 1 tablet by mouth daily.   01/29/2023   Coenzyme Q10 10 MG capsule Take 10 mg by mouth every morning.   01/29/2023   dapagliflozin propanediol (FARXIGA) 10 MG TABS tablet Take 10 mg by mouth daily.   01/29/2023   enalapril (VASOTEC) 20 MG tablet Take 20 mg by mouth 2 (two) times daily.   01/29/2023   fenofibrate (TRICOR) 48 MG tablet Take 1 tablet by mouth daily.   01/29/2023   ferrous sulfate 325 (65 FE) MG tablet Take 325 mg  of Food in the Last Year: Never true    Ran Out of Food in the Last Year: Never true  Transportation Needs: No Transportation Needs (01/30/2023)   PRAPARE - Administrator, Civil Service (Medical): No    Lack of Transportation (Non-Medical): No  Physical Activity: Not on file  Stress: Not on file  Social Connections: Not on file  Intimate Partner Violence: Not At Risk (01/30/2023)   Humiliation, Afraid, Rape, and Kick questionnaire    Fear of Current or Ex-Partner: No    Emotionally Abused: No    Physically Abused: No    Sexually Abused: No    Family History  Problem Relation Age of Onset   Breast cancer Paternal Aunt      Vitals:   02/01/23 0111 02/01/23 0400 02/01/23 0419 02/01/23 0808  BP: (!) 120/94 111/65  132/79  Pulse:    99  Resp: 18 20    Temp:    98 F (36.7 C)  TempSrc:    Oral  SpO2: 97%   94%  Weight:   88.7 kg   Height:        PHYSICAL EXAM General: Well appearing elderly female, well nourished, in no acute distress. HEENT: Normocephalic and atraumatic. Neck: No JVD.  Lungs: Normal respiratory effort on room air. Clear bilaterally to auscultation. No wheezes, crackles, rhonchi.  Heart: Irregularly irregular, controlled rate. Normal S1 and S2 without gallops or murmurs.  Abdomen: Non-distended appearing.  Msk: Normal strength and tone for age. Extremities: Warm and well perfused. No clubbing, cyanosis. No edema.  Neuro: Alert and oriented X 3. Psych: Answers questions  appropriately.   Labs: Basic Metabolic Panel: Recent Labs    01/31/23 0506 02/01/23 0404  NA 130* 128*  K 4.0 4.6  CL 94* 92*  CO2 20* 22  GLUCOSE 382* 429*  BUN 41* 59*  CREATININE 1.63* 1.40*  CALCIUM 9.0 9.4   Liver Function Tests: Recent Labs    01/30/23 0837  AST 24  ALT 19  ALKPHOS 52  BILITOT 1.0  PROT 7.5  ALBUMIN 3.9   No results for input(s): "LIPASE", "AMYLASE" in the last 72 hours. CBC: Recent Labs    01/31/23 0506 02/01/23 0404  WBC 12.4* 14.4*  NEUTROABS  --  12.6*  HGB 14.4 13.7  HCT 43.5 39.9  MCV 87.7 86.0  PLT 248 241   Cardiac Enzymes: Recent Labs    01/30/23 0837 01/30/23 1506 01/30/23 1646  TROPONINIHS 8 7 7    BNP: Recent Labs    01/30/23 0837 01/31/23 0506  BNP 289.9* 456.1*   D-Dimer: No results for input(s): "DDIMER" in the last 72 hours. Hemoglobin A1C: Recent Labs    01/31/23 0506  HGBA1C 7.6*   Fasting Lipid Panel: No results for input(s): "CHOL", "HDL", "LDLCALC", "TRIG", "CHOLHDL", "LDLDIRECT" in the last 72 hours. Thyroid Function Tests: Recent Labs    01/30/23 0837  TSH 4.844*   Anemia Panel: No results for input(s): "VITAMINB12", "FOLATE", "FERRITIN", "TIBC", "IRON", "RETICCTPCT" in the last 72 hours.   Radiology: ECHOCARDIOGRAM COMPLETE  Result Date: 01/31/2023    ECHOCARDIOGRAM REPORT   Patient Name:   Samantha Freeman Date of Exam: 01/30/2023 Medical Rec #:  [id number]       Height:       64.0 in Accession #:    [id number]      Weight:       184.0 lb Date of Birth:  Sep 17, 1935        BSA:  [facility] CLINIC CARDIOLOGY CONSULT NOTE       Patient ID: Samantha Freeman MRN: [id number] DOB/AGE: 1936-02-23 87 y.o.  Admit date: 01/30/2023 Referring Physician Dr. Jonah Blue Primary Physician Dr. Judithann Sheen Primary Cardiologist Dr. Darrold Junker Reason for Consultation atrial fibrillation RVR  HPI: Samantha Freeman is a 87 y.o. female  with a past medical history of mild aortic stenosis, bradycardia, type 2 diabetes, hypercholesterolemia who presented to the ED on 01/30/2023 for SOB. Also noted to be in new onset atrial fibrillation. Cardiology was consulted for further evaluation.   Interval history: -HR much better controlled this AM. Denies any chest pain or palpitations. -Patient reports feeling better overall. -Continues to report weakness, worked with PT yesterday.   Review of systems complete and found to be negative unless listed above    Past Medical History:  Diagnosis Date   Anemia    Anxiety    Aortic stenosis, mild    Arthritis    CKD (chronic kidney disease), stage III (HCC)    Complication of anesthesia    Propofol anaphylaxis   Cortical cataract    DDD (degenerative disc disease), lumbar    Depression    Dyspnea    GERD (gastroesophageal reflux disease)    Headache    Heart murmur    History of 2019 novel coronavirus disease (COVID-19) 12/01/2018   HLD (hyperlipidemia)    Hypertension    Pneumonia    Schatzki's ring    Seasonal allergies    T2DM (type 2 diabetes mellitus) (HCC)    Valvular insufficiency     Past Surgical History:  Procedure Laterality Date   ABDOMINAL HYSTERECTOMY     APPENDECTOMY     BICEPT TENODESIS Right 05/13/2018   Procedure: BICEPS TENODESIS;  Surgeon: Christena Flake, MD;  Location: [facility];  Service: Orthopedics;  Laterality: Right;   CARDIAC CATHETERIZATION     COLONOSCOPY     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   EYE SURGERY Right 09/2013   Corneal transplant   JOINT REPLACEMENT Right 2015   knee   REVERSE  SHOULDER ARTHROPLASTY Left 10/13/2020   Procedure: REVERSE SHOULDER ARTHROPLASTY;  Surgeon: Christena Flake, MD;  Location: [facility];  Service: Orthopedics;  Laterality: Left;   SHOULDER ARTHROSCOPY WITH ROTATOR CUFF REPAIR Right 05/13/2018   Procedure: SHOULDER ARTHROSCOPY WITH DEBRIDEMENT, DECOMPRESSION AND ROTATOR CUFF REPAIR;  Surgeon: Christena Flake, MD;  Location: [facility];  Service: Orthopedics;  Laterality: Right;   TONSILLECTOMY      Medications Prior to Admission  Medication Sig Dispense Refill Last Dose   ALPRAZolam (XANAX) 0.5 MG tablet Take 0.5 mg by mouth at bedtime as needed for sleep.    01/29/2023   amLODipine (NORVASC) 10 MG tablet Take 1 tablet (10 mg total) by mouth daily. 30 tablet 0 01/29/2023   aspirin EC 81 MG tablet Take 81 mg by mouth every other day.   01/29/2023   Calcium Carb-Cholecalciferol (CALCIUM 600/VITAMIN D3 PO) Take 1 tablet by mouth daily.   01/29/2023   Coenzyme Q10 10 MG capsule Take 10 mg by mouth every morning.   01/29/2023   dapagliflozin propanediol (FARXIGA) 10 MG TABS tablet Take 10 mg by mouth daily.   01/29/2023   enalapril (VASOTEC) 20 MG tablet Take 20 mg by mouth 2 (two) times daily.   01/29/2023   fenofibrate (TRICOR) 48 MG tablet Take 1 tablet by mouth daily.   01/29/2023   ferrous sulfate 325 (65 FE) MG tablet Take 325 mg

## 2023-02-01 NOTE — TOC Initial Note (Addendum)
Transition of Care [facility]) - Initial/Assessment Note    Patient Details  Name: Samantha Freeman MRN: [id number] Date of Birth: October 07, 1935  Transition of Care [facility]) CM/SW Contact:    Darolyn Rua, LCSW Phone Number: 02/01/2023, 2:05 PM  Clinical Narrative:                  CSW met with patient at bedside to review snf bed offers, she chooses [facility] as it's close to her home.   CSW has let Tammy and Gena at [facility] know of choice, insurance Berkley Harvey has been started. Pending auth at this time.  Expected Discharge Plan: Skilled Nursing Facility     Patient Goals and CMS Choice Patient states their goals for this hospitalization and ongoing recovery are:: to go home CMS Medicare.gov Compare Post Acute Care list provided to:: Patient Choice offered to / list presented to : Patient      Expected Discharge Plan and Services                                              Prior Living Arrangements/Services                       Activities of Daily Living   ADL Screening (condition at time of admission) Independently performs ADLs?: Yes (appropriate for developmental age) Is the patient deaf or have difficulty hearing?: No Does the patient have difficulty seeing, even when wearing glasses/contacts?: No Does the patient have difficulty concentrating, remembering, or making decisions?: No  Permission Sought/Granted                  Emotional Assessment              Admission diagnosis:  Acute respiratory failure (HCC) [J96.00] Hypoxia [R09.02] Patient Active Problem List   Diagnosis Date Noted   Acute respiratory failure (HCC) 01/30/2023   Status post reverse arthroplasty of shoulder, left 10/13/2020   Acute respiratory disease due to COVID-19 virus 12/06/2018   Pneumonia due to COVID-19 virus 12/06/2018   CKD (chronic kidney disease) 12/06/2018   DM (diabetes mellitus) (HCC) 12/06/2018   HTN (hypertension) 12/06/2018   PNA (pneumonia) 12/06/2018    Rotator cuff tear 05/13/2018   PCP:  Marguarite Arbour, MD Pharmacy:   [facility] [phone] - [place], [place] - [address] [place] [address] Phone: [phone] Fax: [phone]     Social Determinants of Health (SDOH) Social History: SDOH Screenings   Food Insecurity: No Food Insecurity (01/30/2023)  Housing: Low Risk  (01/30/2023)  Transportation Needs: No Transportation Needs (01/30/2023)  Utilities: Not At Risk (01/30/2023)  Tobacco Use: Low Risk  (01/16/2023)   Received from [facility]   SDOH Interventions:     Readmission Risk Interventions     No data to display

## 2023-02-01 NOTE — Evaluation (Signed)
Occupational Therapy Evaluation Patient Details Name: Samantha Freeman MRN: [id number] DOB: 01-29-1936 Today's Date: 02/01/2023   History of Present Illness Patient presented to the ED 01/30/23 with complaint of progressively worsening shortness of breath for last several days.  PMH significant for obesity, DM2, HTN, HLD, sinus bradycardia, CKD, GERD/Schatzki's ring, anemia, anxiety/depression, vascular insufficiency, COVID pneumonia.   Clinical Impression   Pt was seen for OT evaluation this date. Prior to hospital admission, pt was mod indep however was experiencing increased difficulty completing ADL and mobility 2/2 fatigue. Pt presents to acute OT demonstrating impaired ADL performance and functional mobility 2/2 decreased balance, activity tolerance, strength, and cardiopulmonary status (See OT problem list for additional functional deficits). Pt currently requires PRN MIN A for LB ADL tasks, increased time/effort to complete limited exertional activities, and CGA for ADL mobility using RW. Pt educated in ECS to support daily ADL/IADL performance and safety requiring additional cues to support comprehension. Pt demonstrates slightly questionable awareness of deficits. SpO2 drops with exertional activity as well. Pt would benefit from skilled OT services to address noted impairments and functional limitations (see below for any additional details) in order to maximize safety and independence while minimizing falls risk and caregiver burden.     If plan is discharge home, recommend the following: A little help with walking and/or transfers;A little help with bathing/dressing/bathroom;Assistance with cooking/housework;Assist for transportation;Help with stairs or ramp for entrance    Functional Status Assessment  Patient has had a recent decline in their functional status and demonstrates the ability to make significant improvements in function in a reasonable and predictable amount of time.   Equipment Recommendations  Other (comment) (2ww)    Recommendations for Other Services       Precautions / Restrictions Precautions Precautions: Fall Restrictions Weight Bearing Restrictions: No      Mobility Bed Mobility               General bed mobility comments: in recliner on arrival and at end of session    Transfers Overall transfer level: Needs assistance Equipment used: None, Rolling walker (2 wheels) Transfers: Sit to/from Stand Sit to Stand: Supervision           General transfer comment: good hand placement, Supv for safety      Balance Overall balance assessment: Needs assistance Sitting-balance support: Feet supported Sitting balance-Leahy Scale: Good     Standing balance support: Bilateral upper extremity supported, During functional activity, Reliant on assistive device for balance Standing balance-Leahy Scale: Fair                             ADL either performed or assessed with clinical judgement   ADL Overall ADL's : Needs assistance/impaired                                       General ADL Comments: Pt requires PRN MIN A for LB ADL tasks, demonstrates poor tolerance for standing ADL tasks with SpO2 dropping over time. CGA for mobility using RW     Vision         Perception         Praxis         Pertinent Vitals/Pain Pain Assessment Pain Assessment: No/denies pain     Extremity/Trunk Assessment Upper Extremity Assessment Upper Extremity Assessment: Generalized weakness   Lower Extremity Assessment Lower  Extremity Assessment: Generalized weakness   Cervical / Trunk Assessment Cervical / Trunk Assessment: Normal   Communication     Cognition Arousal: Alert Behavior During Therapy: WFL for tasks assessed/performed Overall Cognitive Status: Within Functional Limits for tasks assessed                                       General Comments  SpO2 drops from mid 90's  to 89-90% on room air within ~23min    Exercises Other Exercises Other Exercises: Pt educated in energy conservation strategies including activity pacing, work simplification, falls prevention, home/routines modifications, AE/DME, and PLB.   Shoulder Instructions      Home Living Family/patient expects to be discharged to:: Private residence Living Arrangements: Alone Available Help at Discharge: Family;Available PRN/intermittently Type of Home: House Home Access: Level entry     Home Layout: One level     Bathroom Shower/Tub: Producer, television/film/video: Handicapped height Bathroom Accessibility: Yes   Home Equipment: Cane - single point;Standard Environmental consultant;Shower seat - built in;Grab bars - tub/shower;Hand held shower head          Prior Functioning/Environment Prior Level of Function : Independent/Modified Independent             Mobility Comments: Had recently started using Cane due to weakness ADLs Comments: mod I, uses weekly pill box for medications, light meal prep, typically stands for shower        OT Problem List: Decreased activity tolerance;Impaired balance (sitting and/or standing);Decreased knowledge of use of DME or AE;Decreased safety awareness;Cardiopulmonary status limiting activity;Decreased strength      OT Treatment/Interventions: Self-care/ADL training;Therapeutic exercise;Therapeutic activities;DME and/or AE instruction;Energy conservation;Patient/family education;Balance training    OT Goals(Current goals can be found in the care plan section) Acute Rehab OT Goals Patient Stated Goal: go home OT Goal Formulation: With patient Time For Goal Achievement: 02/15/23 Potential to Achieve Goals: Good ADL Goals Pt Will Perform Lower Body Dressing: with modified independence;sit to/from stand Pt Will Transfer to Toilet: with modified independence;ambulating (LRAD, SpO2 >90%) Additional ADL Goal #1: Pt will verbalize plan to implement at least 1  learned energy conservation strategy to maximize safety/indep with ADL.  OT Frequency: Min 1X/week    Co-evaluation              AM-PAC OT "6 Clicks" Daily Activity     Outcome Measure Help from another person eating meals?: None Help from another person taking care of personal grooming?: A Little Help from another person toileting, which includes using toliet, bedpan, or urinal?: A Little Help from another person bathing (including washing, rinsing, drying)?: A Little Help from another person to put on and taking off regular upper body clothing?: None Help from another person to put on and taking off regular lower body clothing?: A Little 6 Click Score: 20   End of Session Equipment Utilized During Treatment: Rolling walker (2 wheels)  Activity Tolerance: Patient tolerated treatment well Patient left: in chair;with call bell/phone within reach  OT Visit Diagnosis: Other abnormalities of gait and mobility (R26.89)                Time: 5621-3086 OT Time Calculation (min): 28 min Charges:  OT General Charges $OT Visit: 1 Visit OT Evaluation $OT Eval Low Complexity: 1 Low OT Treatments $Self Care/Home Management : 8-22 mins  Arman Filter., MPH, MS, OTR/L ascom [phone] 02/01/23, 12:04 PM

## 2023-02-01 NOTE — Care Management Important Message (Signed)
Important Message  Patient Details  Name: Samantha Freeman MRN: [id number] Date of Birth: 1935-10-07   Important Message Given:  Yes - Medicare IM     [name] A Lorynn Moeser 02/01/2023, 3:29 PM

## 2023-02-01 NOTE — NC FL2 (Signed)
[place] MEDICAID FL2 LEVEL OF CARE FORM     IDENTIFICATION  Patient Name: Samantha Freeman Birthdate: [birth year] Sex: female Admission Date (Current Location): 01/30/2023  [place] and [place] Number:  Chiropodist and Address:  [facility], [address] [place], [place] [address]      Provider Number: [id number]  Attending Physician Name and Address:  Jonah Blue, MD  Relative Name and Phone Number:  son Casimiro Needle  [phone]    Current Level of Care: Hospital Recommended Level of Care: Skilled Nursing Facility Prior Approval Number:    Date Approved/Denied:   PASRR Number: [id number]  Discharge Plan: SNF    Current Diagnoses: Patient Active Problem List   Diagnosis Date Noted   Acute respiratory failure (HCC) 01/30/2023   Status post reverse arthroplasty of shoulder, left 10/13/2020   Acute respiratory disease due to COVID-19 virus 12/06/2018   Pneumonia due to COVID-19 virus 12/06/2018   CKD (chronic kidney disease) 12/06/2018   DM (diabetes mellitus) (HCC) 12/06/2018   HTN (hypertension) 12/06/2018   PNA (pneumonia) 12/06/2018   Rotator cuff tear 05/13/2018    Orientation RESPIRATION BLADDER Height & Weight     Self, Situation, Place, Time  Normal Continent Weight: 195 lb 8.8 oz (88.7 kg) Height:  5\' 4"  (162.6 cm)  BEHAVIORAL SYMPTOMS/MOOD NEUROLOGICAL BOWEL NUTRITION STATUS      Continent Diet (see discharge summary)  AMBULATORY STATUS COMMUNICATION OF NEEDS Skin   Limited Assist Verbally Normal                       Personal Care Assistance Level of Assistance  Bathing, Feeding, Dressing, Total care Bathing Assistance: Limited assistance Feeding assistance: Independent Dressing Assistance: Limited assistance Total Care Assistance: Limited assistance   Functional Limitations Info  Sight, Speech, Hearing Sight Info: Impaired (glasses) Hearing Info: Adequate Speech Info: Adequate    SPECIAL CARE  FACTORS FREQUENCY  PT (By licensed PT), OT (By licensed OT)     PT Frequency: min 4x weekly OT Frequency: min 4x weekly            Contractures Contractures Info: Not present    Additional Factors Info  Code Status, Allergies Code Status Info: dnr - limited Allergies Info: Buspirone  Propofol  Zolpidem  Cholestyramine  Codeine  Hydrochlorothiazide  Loratadine  Niacin           Current Medications (02/01/2023):  This is the current hospital active medication list Current Facility-Administered Medications  Medication Dose Route Frequency Provider Last Rate Last Admin   acetaminophen (TYLENOL) tablet 650 mg  650 mg Oral Q6H PRN Dahal, Melina Schools, MD   650 mg at 01/31/23 2311   Or   acetaminophen (TYLENOL) suppository 650 mg  650 mg Rectal Q6H PRN Dahal, Melina Schools, MD       albuterol (PROVENTIL) (2.5 MG/3ML) 0.083% nebulizer solution 2.5 mg  2.5 mg Nebulization Q6H PRN Dahal, Melina Schools, MD       ALPRAZolam Prudy Feeler) tablet 0.5 mg  0.5 mg Oral QHS PRN Dahal, Binaya, MD   0.5 mg at 01/31/23 2207   apixaban (ELIQUIS) tablet 5 mg  5 mg Oral BID Jonah Blue, MD   5 mg at 02/01/23 1139   diltiazem (CARDIZEM CD) 24 hr capsule 180 mg  180 mg Oral Daily Hudson, Leach, PA-C       [START ON 02/02/2023] enalapril (VASOTEC) tablet 20 mg  20 mg Oral Daily Hudson, Caralyn, PA-C       fenofibrate  tablet 54 mg  54 mg Oral Daily Jonah Blue, MD   54 mg at 02/01/23 0911   gabapentin (NEURONTIN) capsule 200 mg  200 mg Oral TID Jonah Blue, MD   200 mg at 02/01/23 0911   guaiFENesin (MUCINEX) 12 hr tablet 600 mg  600 mg Oral BID Lorin Glass, MD   600 mg at 02/01/23 0911   hydrALAZINE (APRESOLINE) injection 10 mg  10 mg Intravenous Q6H PRN Dahal, Melina Schools, MD       hydrOXYzine (ATARAX) tablet 25 mg  25 mg Oral BID Jonah Blue, MD   25 mg at 02/01/23 0911   insulin aspart (novoLOG) injection 0-15 Units  0-15 Units Subcutaneous TID WC Jonah Blue, MD   15 Units at 01/31/23 1655   insulin aspart  (novoLOG) injection 0-5 Units  0-5 Units Subcutaneous QHS Dahal, Melina Schools, MD   5 Units at 01/31/23 2050   insulin aspart (novoLOG) injection 5 Units  5 Units Subcutaneous TID WC Jonah Blue, MD   5 Units at 02/01/23 0900   insulin glargine-yfgn (SEMGLEE) injection 40 Units  40 Units Subcutaneous QHS Jonah Blue, MD   40 Units at 01/31/23 2051   loperamide (IMODIUM) capsule 4 mg  4 mg Oral BID PRN Jonah Blue, MD       metoprolol tartrate (LOPRESSOR) tablet 25 mg  25 mg Oral BID Hudson, Caralyn, PA-C   25 mg at 02/01/23 0910   pantoprazole (PROTONIX) EC tablet 40 mg  40 mg Oral Daily Jonah Blue, MD   40 mg at 02/01/23 0910   polyvinyl alcohol (LIQUIFILM TEARS) 1.4 % ophthalmic solution 2 drop  2 drop Both Eyes PRN Dahal, Melina Schools, MD   2 drop at 01/30/23 2330   prednisoLONE acetate (PRED FORTE) 1 % ophthalmic suspension 1 drop  1 drop Both Eyes QHS Jonah Blue, MD   1 drop at 01/31/23 2214   rosuvastatin (CRESTOR) tablet 10 mg  10 mg Oral Daily Hudson, Caralyn, PA-C   10 mg at 02/01/23 0911   traZODone (DESYREL) tablet 50 mg  50 mg Oral Noemi Chapel, MD         Discharge Medications: Please see discharge summary for a list of discharge medications.  Relevant Imaging Results:  Relevant Lab Results:   Additional Information SSN: [id number]  Darolyn Rua, LCSW

## 2023-02-02 DIAGNOSIS — I4891 Unspecified atrial fibrillation: Secondary | ICD-10-CM | POA: Diagnosis not present

## 2023-02-02 DIAGNOSIS — E785 Hyperlipidemia, unspecified: Secondary | ICD-10-CM | POA: Diagnosis present

## 2023-02-02 DIAGNOSIS — Z152 Genetic susceptibility to obesity: Secondary | ICD-10-CM

## 2023-02-02 DIAGNOSIS — I482 Chronic atrial fibrillation, unspecified: Secondary | ICD-10-CM | POA: Diagnosis present

## 2023-02-02 DIAGNOSIS — F419 Anxiety disorder, unspecified: Secondary | ICD-10-CM | POA: Diagnosis present

## 2023-02-02 LAB — CBC WITH DIFFERENTIAL/PLATELET
Abs Immature Granulocytes: 0.03 10*3/uL (ref 0.00–0.07)
Basophils Absolute: 0 10*3/uL (ref 0.0–0.1)
Basophils Relative: 0 %
Eosinophils Absolute: 0.1 10*3/uL (ref 0.0–0.5)
Eosinophils Relative: 1 %
HCT: 41 % (ref 36.0–46.0)
Hemoglobin: 13.6 g/dL (ref 12.0–15.0)
Immature Granulocytes: 0 %
Lymphocytes Relative: 21 %
Lymphs Abs: 2 10*3/uL (ref 0.7–4.0)
MCH: 29.1 pg (ref 26.0–34.0)
MCHC: 33.2 g/dL (ref 30.0–36.0)
MCV: 87.6 fL (ref 80.0–100.0)
Monocytes Absolute: 0.9 10*3/uL (ref 0.1–1.0)
Monocytes Relative: 10 %
Neutro Abs: 6.3 10*3/uL (ref 1.7–7.7)
Neutrophils Relative %: 68 %
Platelets: 236 10*3/uL (ref 150–400)
RBC: 4.68 MIL/uL (ref 3.87–5.11)
RDW: 14 % (ref 11.5–15.5)
WBC: 9.3 10*3/uL (ref 4.0–10.5)
nRBC: 0 % (ref 0.0–0.2)

## 2023-02-02 LAB — BASIC METABOLIC PANEL
Anion gap: 9 (ref 5–15)
BUN: 57 mg/dL — ABNORMAL HIGH (ref 8–23)
CO2: 26 mmol/L (ref 22–32)
Calcium: 9.3 mg/dL (ref 8.9–10.3)
Chloride: 103 mmol/L (ref 98–111)
Creatinine, Ser: 1.15 mg/dL — ABNORMAL HIGH (ref 0.44–1.00)
GFR, Estimated: 47 mL/min — ABNORMAL LOW (ref >60)
Glucose, Bld: 179 mg/dL — ABNORMAL HIGH (ref 70–99)
Potassium: 4.5 mmol/L (ref 3.5–5.1)
Sodium: 138 mmol/L (ref 135–145)

## 2023-02-02 LAB — GLUCOSE, CAPILLARY
Glucose-Capillary: 150 mg/dL — ABNORMAL HIGH (ref 70–99)
Glucose-Capillary: 212 mg/dL — ABNORMAL HIGH (ref 70–99)
Glucose-Capillary: 204 mg/dL — ABNORMAL HIGH (ref 70–99)
Glucose-Capillary: 240 mg/dL — ABNORMAL HIGH (ref 70–99)

## 2023-02-02 MED ORDER — ENALAPRIL MALEATE 10 MG PO TABS
20.0000 mg | ORAL_TABLET | Freq: Every day | ORAL | Status: DC
  Administered 2023-02-02 – 2023-02-04 (×3): 20 mg via ORAL
  Filled 2023-02-02 (×3): qty 2

## 2023-02-02 NOTE — TOC Progression Note (Addendum)
Transition of Care [facility]) - Progression Note    Patient Details  Name: Samantha Freeman MRN: [id number] Date of Birth: February 21, 1936  Transition of Care [facility]) CM/SW Contact  Bing Quarry, RN Phone Number: 02/02/2023, 1:55 PM  Clinical Narrative:  10/12: Authorization obtained for patient who accepted bed offer at [facility]. Contacted AC at [facility] to see if can be admitted today since after 12 noon for pharmacy needs. Pending a response.  Auth ID #[id number]. 02/01/23 to 02/05/23.   Gabriel Cirri MSN RN CM  Transitions of Care Department [facility] [phone] Weekends Only   UPDATE: 350 pm.  [facility] is unable to take till Monday as RX is closed all day Sunday. Updated provider.  Gabriel Cirri MSN RN CM  Transitions of Care Department [facility] [phone] Weekends Only   Expected Discharge Plan: Skilled Nursing Facility    Expected Discharge Plan and Services                                               Social Determinants of Health (SDOH) Interventions SDOH Screenings   Food Insecurity: No Food Insecurity (01/30/2023)  Housing: Low Risk  (01/30/2023)  Transportation Needs: No Transportation Needs (01/30/2023)  Utilities: Not At Risk (01/30/2023)  Tobacco Use: Low Risk  (01/16/2023)   Received from [facility]    Readmission Risk Interventions     No data to display

## 2023-02-02 NOTE — Progress Notes (Signed)
[facility] Cardiology  SUBJECTIVE: Patient laying in bed, off telemetry monitor, awaiting placement   Vitals:   02/02/23 0005 02/02/23 0008 02/02/23 0308 02/02/23 0809  BP: (!) 94/57 109/64 107/67 111/65  Pulse: 67 84 69 72  Resp: 18 18 18 20   Temp: 98.5 F (36.9 C) 98.4 F (36.9 C) (!) 97.5 F (36.4 C) 97.6 F (36.4 C)  TempSrc:  Oral    SpO2: (!) 87% 95% 91% 94%  Weight:      Height:         Intake/Output Summary (Last 24 hours) at 02/02/2023 1157 Last data filed at 02/01/2023 2300 Gross per 24 hour  Intake 840 ml  Output 200 ml  Net 640 ml      PHYSICAL EXAM  General: Well developed, well nourished, in no acute distress HEENT:  Normocephalic and atramatic Neck:  No JVD.  Lungs: Clear bilaterally to auscultation and percussion. Heart: HRRR . Normal S1 and S2 without gallops or murmurs.  Abdomen: Bowel sounds are positive, abdomen soft and non-tender  Msk:  Back normal, normal gait. Normal strength and tone for age. Extremities: No clubbing, cyanosis or edema.   Neuro: Alert and oriented X 3. Psych:  Good affect, responds appropriately   LABS: Basic Metabolic Panel: Recent Labs    02/01/23 0404 02/02/23 0322  NA 128* 138  K 4.6 4.5  CL 92* 103  CO2 22 26  GLUCOSE 429* 179*  BUN 59* 57*  CREATININE 1.40* 1.15*  CALCIUM 9.4 9.3   Liver Function Tests:  CBC: Recent Labs    02/01/23 0404 02/02/23 0322  WBC 14.4* 9.3  NEUTROABS 12.6* 6.3  HGB 13.7 13.6  HCT 39.9 41.0  MCV 86.0 87.6  PLT 241 236   Cardiac Enzymes:  Hemoglobin A1C: Recent Labs    01/31/23 0506  HGBA1C 7.6*   Fasting Lipid Panel:   No results found.   Echo LVEF 60-65% 01/30/2023 not on telemetry  TELEMETRY: Not on monitor:  ASSESSMENT AND PLAN:  Principal Problem:   Acute respiratory failure (HCC) Active Problems:   CKD (chronic kidney disease)   DM (diabetes mellitus) (HCC)   HTN (hypertension)    1.  Persistent atrial fibrillation, new onset, on Eliquis for  stroke prevention, rate controlled on Cardizem CD 180 mg daily, and metoprolol tartrate 25 mg twice daily for rate control 2.  Essential hypertension, blood pressure adequately controlled on current BP medications 3.  Bronchitis/acute respiratory failure with hypoxia/recent COVID infection, currently clinically stable, oxygen saturation 94% on room air  Recommendations  1.  Agree with current therapy 2.  Continue Eliquis for stroke prevention 3.  Continue Cardizem CD and metoprolol to tartrate for rate control 4.  Awaiting placement 5.  Follow-up with me 1 to 2 weeks  Sign off for now, please call with any questions   Marcina Millard, MD, PhD, [facility] 02/02/2023 11:57 AM

## 2023-02-02 NOTE — Progress Notes (Addendum)
Progress Note   Patient: Samantha Freeman GMW:[id number] DOB: [birth year] DOA: 01/30/2023     3 DOS: the patient was seen and examined on 02/02/2023   Brief hospital course: 87yo with h/o class 1 obesity, DM, HTN, HLD, possible long COVID syndrome, and CKD who presented on 10/9 with SOB. She tested positive for COVID with symptoms 2 months ago and then had 2 subsequent URIs about 3 weeks apart.  She was treated with amoxil and steroids.   O2 83% on RA, placed on 4L [place] O2.  CTA with bronchitis.  Patient is DNR/DNI.  She was treated with steroids, bronchodilators, Mucinex, and 1 dose of Lasix.  She was subsequently noted to be in afib, which was thought to be related to her symptoms.  She was started on Cardizem drip and full-dose Lovenox.  Assessment and Plan:  New onset afib Symptoms above appear to be related to this issue Since the afib onset is unknown, will focus on rate control and searching for the underlying cause at this time. Admitted to progressive for Diltiazem drip as per protocol and has transitioned to PO Diltiazem with sustained heart rate control Echocardiogram with preserved EF, indeterminate diastolic filling Cardiology consulting CHA2DS2-VASc Score is >2 and so patient would benefit from oral anticoagulation. There is evidence of net benefit even in the extremely elderly population. She is adamantly opposed to Coumadin, but reports that she will have difficulty affording Eliquis and is likely to need medication assistance Eliquis voucher provided for 30 day supply and cardiology/pharmacy are working with her on medication assistance paperwork   Acute respiratory failure with hypoxia, resolved Presented with several days of progressively worsening shortness of breath, not responding to Augmentin and prednisone as an outpatient Patient reports her symptoms started after her diagnosis of COVID 2 months ago. Noted to be hypoxic on ambulation requiring 4 L oxygen. On presenting  exam had bilateral crackles, no wheezing No fever, WBC count normal, procalcitonin level normal, elevated BNP CT chest findings suggestive of acute bronchitis, mucous plugs as well as right heart strain/failure. Patient's symptoms were thought to be secondary to a combination of reactive airway disease as well as CHF. Given 1 dose of IV Solu-Medrol in the ED and Solu-Medrol IV 40 mg daily  Does not use oxygen at home She was subsequently found to be in afib with RVR and was given a dose of Lasix with rate control She had subsequently had resolution in respiratory symptoms and has remained comfortable on room air Given resolution of symptoms, steroids were stopped   Possible CHF exacerbation Although no prior h/o of CHF, patient reports she used to be on Lasix in the past for pedal edema. Had tachycardia, bilateral crackles on auscultation and BNP elevated on presentation CT chest showed reflux of contrast in the intrahepatic IVC and central intrahepatic veins favoring right heart strain/failure. Given 1 dose of Lasix IV 40 mg Updated echocardiogram without frank systolic or diastolic CHF Symptoms have resolved so anticipate that volume overload was related to afib with RVR rather than to heart failure Follows up with cardiologist Dr. Darrold Junker, who has been consulting   Essential hypertension Stop amlodipine due to need for diltiazem Enalapril, Propranolol were resumed as needed with increasing BP   Type 2 diabetes mellitus A1c is 7.6 Significant hyperglycemia thus far during hospitalization, likely due to steroids received - improving Resumed glargine 40 units daily Covering with increasing doses of mealtime insulin as well as moderate-scale SSI Hold Farxiga Continue Neurontin   HLD  Continue fenofibrate, rosuvastatin   Abnormal TSH TSH slightly elevated to 4.8, normal free T4 - likely sick euthyroid   AKI on stage 3a CKD Appears to have worsened creatinine that is likely  associated with diuresis as well as diminished renal perfusion in the setting of RVR Holding diuresis with improvement AKI resolved, creatinine is back at baseline Recheck in AM   Anxiety/depression Continue Ativan, hydroxyzine   Class 1 obesity Body mass index is 31.58 kg/m.Marland Kitchen  Weight loss should be encouraged Outpatient PCP/bariatric medicine/bariatric surgery f/u encouraged    Generalized weakness Patient lives alone and is concerned about her ability to be successful at home  She acknowledges probable need for SNF rehab PT/OT consults She is now medically stable and awaiting placement - this is planned for Monday, 10/14       Consultants: Cardiology   Procedures: Echocardiogram 10/9   Antibiotics: None    30 Day Unplanned Readmission Risk Score    Flowsheet Row ED to Hosp-Admission (Current) from 01/30/2023 in [facility]U  30 Day Unplanned Readmission Risk Score (%) 15.83 Filed at 02/02/2023 1200       This score is the patient's risk of an unplanned readmission within 30 days of being discharged (0 -100%). The score is based on dignosis, age, lab data, medications, orders, and past utilization.   Low:  0-14.9   Medium: 15-21.9   High: 22-29.9   Extreme: 30 and above           Subjective: Feeling a little better.  No SNF bed available until Monday.   Objective: Vitals:   02/02/23 0809 02/02/23 1319  BP: 111/65 110/82  Pulse: 72 61  Resp: 20 20  Temp: 97.6 F (36.4 C) 97.7 F (36.5 C)  SpO2: 94% 92%    Intake/Output Summary (Last 24 hours) at 02/02/2023 1549 Last data filed at 02/02/2023 1417 Gross per 24 hour  Intake 1080 ml  Output --  Net 1080 ml   Filed Weights   01/30/23 0805 02/01/23 0419  Weight: 83.5 kg 88.7 kg    Exam:  General:  Appears calm and comfortable and is in NAD, in bedside chair, on RA Eyes:  EOMI, normal lids, iris ENT:  grossly normal hearing, lips & tongue, mmm Neck:  no LAD, masses or  thyromegaly Cardiovascular:  Irregularly rhythm with rate in 80s. No LE edema.  Respiratory:   CTA bilaterally with no wheezes/rales/rhonchi.  Normal respiratory effort. Abdomen:  soft, NT, ND Skin:  no rash or induration seen on limited exam Musculoskeletal:  grossly normal tone BUE/BLE, good ROM, no bony abnormality Psychiatric:  grossly normal mood and affect, speech fluent and appropriate, AOx3 Neurologic:  CN 2-12 grossly intact, moves all extremities in coordinated fashion  Data Reviewed: I have reviewed the patient's lab results since admission.  Pertinent labs for today include:   Glucose 179 BUN 57/Creatinine 1.15/GFR 47 - improved Normal CBC     Family Communication: None present  Disposition: Status is: Inpatient Remains inpatient appropriate because: awaiting SNF     Time spent: 35 minutes   Author: Jonah Blue, MD 02/02/2023 3:49 PM  For on call review [email or web address].

## 2023-02-03 DIAGNOSIS — I4891 Unspecified atrial fibrillation: Secondary | ICD-10-CM | POA: Diagnosis not present

## 2023-02-03 LAB — GLUCOSE, CAPILLARY
Glucose-Capillary: 160 mg/dL — ABNORMAL HIGH (ref 70–99)
Glucose-Capillary: 126 mg/dL — ABNORMAL HIGH (ref 70–99)
Glucose-Capillary: 133 mg/dL — ABNORMAL HIGH (ref 70–99)
Glucose-Capillary: 144 mg/dL — ABNORMAL HIGH (ref 70–99)
Glucose-Capillary: 170 mg/dL — ABNORMAL HIGH (ref 70–99)

## 2023-02-03 NOTE — Progress Notes (Signed)
Progress Note   Patient: Samantha Freeman NGE:[id number] DOB: [birth year] DOA: 01/30/2023     4 DOS: the patient was seen and examined on 02/03/2023   Brief hospital course: 87yo with h/o class 1 obesity, DM, HTN, HLD, possible long COVID syndrome, and CKD who presented on 10/9 with SOB. She tested positive for COVID with symptoms 2 months ago and then had 2 subsequent URIs about 3 weeks apart.  She was treated with amoxil and steroids.   O2 83% on RA, placed on 4L [place] O2.  CTA with bronchitis.  Patient is DNR/DNI.  She was treated with steroids, bronchodilators, Mucinex, and 1 dose of Lasix.  She was subsequently noted to be in afib, which was thought to be related to her symptoms.  She was started on Cardizem drip and full-dose Lovenox.  Assessment and Plan:  Note: Patient is medically ready for SNF rehab placement  New onset afib Symptoms above appear to be related to this issue Since the afib onset is unknown, will focus on rate control and searching for the underlying cause at this time. Admitted to progressive for Diltiazem drip as per protocol and has transitioned to PO Diltiazem with sustained heart rate control Echocardiogram with preserved EF, indeterminate diastolic filling Cardiology consulting CHA2DS2-VASc Score is >2 and so patient would benefit from oral anticoagulation. There is evidence of net benefit even in the extremely elderly population. She is adamantly opposed to Coumadin, but reports that she will have difficulty affording Eliquis and is likely to need medication assistance Eliquis voucher provided for 30 day supply and cardiology/pharmacy are working with her on medication assistance paperwork   Acute respiratory failure with hypoxia, resolved Presented with several days of progressively worsening shortness of breath, not responding to Augmentin and prednisone as an outpatient Patient reports her symptoms started after her diagnosis of COVID 2 months ago. Noted to be  hypoxic on ambulation requiring 4 L oxygen. On presenting exam had bilateral crackles, no wheezing No fever, WBC count normal, procalcitonin level normal, elevated BNP CT chest findings suggestive of acute bronchitis, mucous plugs as well as right heart strain/failure. Patient's symptoms were thought to be secondary to a combination of reactive airway disease as well as CHF. Given 1 dose of IV Solu-Medrol in the ED and Solu-Medrol IV 40 mg daily  Does not use oxygen at home She was subsequently found to be in afib with RVR and was given a dose of Lasix with rate control She had subsequently had resolution in respiratory symptoms and has remained comfortable on room air Given resolution of symptoms, steroids were stopped   Possible CHF exacerbation Although no prior h/o of CHF, patient reports she used to be on Lasix in the past for pedal edema. Had tachycardia, bilateral crackles on auscultation and BNP elevated on presentation CT chest showed reflux of contrast in the intrahepatic IVC and central intrahepatic veins favoring right heart strain/failure. Given 1 dose of Lasix IV 40 mg Updated echocardiogram without frank systolic or diastolic CHF Symptoms have resolved so anticipate that volume overload was related to afib with RVR rather than to heart failure Follows up with cardiologist Dr. Darrold Junker, who has been consulting; cardiology has signed off and recommends f/u in clinic on 1-2 weeks   Essential hypertension Stop amlodipine due to need for diltiazem Enalapril, Propranolol were resumed as needed with increasing BP   Type 2 diabetes mellitus A1c is 7.6 Significant hyperglycemia thus far during hospitalization, likely due to steroids received - improving Resumed glargine  40 units daily Covering with increasing doses of mealtime insulin as well as moderate-scale SSI Hold Farxiga Continue Neurontin   HLD Continue fenofibrate, rosuvastatin   Abnormal TSH TSH slightly elevated  to 4.8, normal free T4 - likely sick euthyroid   AKI on stage 3a CKD Appears to have worsened creatinine that is likely associated with diuresis as well as diminished renal perfusion in the setting of RVR Holding diuresis with improvement AKI resolved, creatinine is back at baseline   Anxiety/depression Continue Ativan, hydroxyzine   Class 1 obesity Body mass index is 31.58 kg/m.Marland Kitchen  Weight loss should be encouraged Outpatient PCP/bariatric medicine/bariatric surgery f/u encouraged    Generalized weakness Patient lives alone and is concerned about her ability to be successful at home  She acknowledges probable need for SNF rehab PT/OT consults She is now medically stable and awaiting placement - this is planned for Monday, 10/14       Consultants: Cardiology   Procedures: Echocardiogram 10/9   Antibiotics: None    30 Day Unplanned Readmission Risk Score    Flowsheet Row ED to Hosp-Admission (Current) from 01/30/2023 in [facility] PCU  30 Day Unplanned Readmission Risk Score (%) 15.07 Filed at 02/03/2023 0401       This score is the patient's risk of an unplanned readmission within 30 days of being discharged (0 -100%). The score is based on dignosis, age, lab data, medications, orders, and past utilization.   Low:  0-14.9   Medium: 15-21.9   High: 22-29.9   Extreme: 30 and above           Subjective: No new concerns, awaiting placement.   Objective: Vitals:   02/03/23 0400 02/03/23 0724  BP:  131/78  Pulse:  (!) 104  Resp:  18  Temp:  98.5 F (36.9 C)  SpO2: 91% 93%    Intake/Output Summary (Last 24 hours) at 02/03/2023 0752 Last data filed at 02/02/2023 2000 Gross per 24 hour  Intake 600 ml  Output --  Net 600 ml   Filed Weights   01/30/23 0805 02/01/23 0419  Weight: 83.5 kg 88.7 kg    Exam:  General:  Appears calm and comfortable and is in NAD, on RA Eyes:  EOMI, normal lids, iris ENT:  grossly normal hearing, lips &  tongue, mmm Neck:  no LAD, masses or thyromegaly Cardiovascular:  Irregularly rhythm with rate in 80s. No LE edema.  Respiratory:   CTA bilaterally with no wheezes/rales/rhonchi.  Normal respiratory effort. Abdomen:  soft, NT, ND Skin:  no rash or induration seen on limited exam Musculoskeletal:  grossly normal tone BUE/BLE, good ROM, no bony abnormality Psychiatric:  grossly normal mood and affect, speech fluent and appropriate, AOx3 Neurologic:  CN 2-12 grossly intact, moves all extremities in coordinated fashion  Data Reviewed: I have reviewed the patient's lab results since admission.  Pertinent labs for today include:   None today     Family Communication: None present  Disposition: Status is: Inpatient Remains inpatient appropriate because: awaiting placement     Time spent: 25 minutes  Unresulted Labs (From admission, onward)    None        Author: Jonah Blue, MD 02/03/2023 7:52 AM  For on call review [email or web address].

## 2023-02-03 NOTE — Progress Notes (Signed)
Physical Therapy Treatment Patient Details Name: Samantha Freeman MRN: [id number] DOB: 07/29/1935 Today's Date: 02/03/2023   History of Present Illness Patient presented to the ED 01/30/23 with complaint of progressively worsening shortness of breath for last several days.  PMH significant for obesity, DM2, HTN, HLD, sinus bradycardia, CKD, GERD/Schatzki's ring, anemia, anxiety/depression, vascular insufficiency, COVID pneumonia.    PT Comments  Pt ready for session.  Stands and is able to walk 130' with RW with slow and generally unsteady gait but needs no outside intervention.  She does have noted fatigue with some decrease in gait quality as she fatigues.  Remains up in chair after session.  Stated she is excited for rehab to be able to return home.  Stated she would struggle managing tasks at home at this time.     If plan is discharge home, recommend the following: A little help with walking and/or transfers;A little help with bathing/dressing/bathroom;Assistance with cooking/housework;Assist for transportation   Can travel by private vehicle        Equipment Recommendations  None recommended by PT    Recommendations for Other Services       Precautions / Restrictions Precautions Precautions: Fall Restrictions Weight Bearing Restrictions: No     Mobility  Bed Mobility               General bed mobility comments: in recliner on arrival and at end of session Patient Response: Cooperative  Transfers Overall transfer level: Needs assistance Equipment used: Rolling walker (2 wheels) Transfers: Sit to/from Stand Sit to Stand: Supervision           General transfer comment: good hand placement, Supv for safety    Ambulation/Gait Ambulation/Gait assistance: Contact guard assist, Min assist Gait Distance (Feet): 130 Feet Assistive device: Rolling walker (2 wheels) Gait Pattern/deviations: Step-through pattern, Decreased step length - right, Decreased step length -  left, Decreased stride length Gait velocity: decreased     General Gait Details: general imbalances during gait but she is able to recover with cga x 1.  fatigued with distances   Stairs             Wheelchair Mobility     Tilt Bed Tilt Bed Patient Response: Cooperative  Modified Rankin (Stroke Patients Only)       Balance Overall balance assessment: Needs assistance Sitting-balance support: Feet supported Sitting balance-Leahy Scale: Good     Standing balance support: Bilateral upper extremity supported, During functional activity, Reliant on assistive device for balance Standing balance-Leahy Scale: Fair Standing balance comment: requires at least single UE support for balance                            Cognition Arousal: Alert Behavior During Therapy: WFL for tasks assessed/performed Overall Cognitive Status: Within Functional Limits for tasks assessed                                          Exercises      General Comments        Pertinent Vitals/Pain Pain Assessment Pain Assessment: No/denies pain    Home Living                          Prior Function            PT Goals (current goals  can now be found in the care plan section) Progress towards PT goals: Progressing toward goals    Frequency    Min 1X/week      PT Plan      Co-evaluation              AM-PAC PT "6 Clicks" Mobility   Outcome Measure  Help needed turning from your back to your side while in a flat bed without using bedrails?: A Little Help needed moving from lying on your back to sitting on the side of a flat bed without using bedrails?: A Little Help needed moving to and from a bed to a chair (including a wheelchair)?: A Little Help needed standing up from a chair using your arms (e.g., wheelchair or bedside chair)?: A Little Help needed to walk in hospital room?: A Little Help needed climbing 3-5 steps with a railing?  : A Little 6 Click Score: 18    End of Session Equipment Utilized During Treatment: Gait belt Activity Tolerance: Patient tolerated treatment well;Patient limited by fatigue Patient left: in chair;with call bell/phone within reach Nurse Communication: Mobility status PT Visit Diagnosis: Unsteadiness on feet (R26.81);Muscle weakness (generalized) (M62.81);Difficulty in walking, not elsewhere classified (R26.2)     Time: 1141-1150 PT Time Calculation (min) (ACUTE ONLY): 9 min  Charges:    $Gait Training: 8-22 mins PT General Charges $$ ACUTE PT VISIT: 1 Visit                   Danielle Dess, PTA 02/03/23, 3:29 PM

## 2023-02-04 DIAGNOSIS — I4891 Unspecified atrial fibrillation: Secondary | ICD-10-CM | POA: Diagnosis not present

## 2023-02-04 LAB — GLUCOSE, CAPILLARY
Glucose-Capillary: 169 mg/dL — ABNORMAL HIGH (ref 70–99)
Glucose-Capillary: 141 mg/dL — ABNORMAL HIGH (ref 70–99)

## 2023-02-04 MED ORDER — METOPROLOL TARTRATE 25 MG PO TABS: 25.0000 mg | ORAL_TABLET | Freq: Two times a day (BID) | ORAL | Status: DC

## 2023-02-04 MED ORDER — PANTOPRAZOLE SODIUM 40 MG PO TBEC: 40.0000 mg | DELAYED_RELEASE_TABLET | Freq: Every day | ORAL | Status: DC

## 2023-02-04 MED ORDER — INSULIN GLARGINE 100 UNIT/ML ~~LOC~~ SOLN: 40.0000 [IU] | Freq: Every day | SUBCUTANEOUS | Status: DC

## 2023-02-04 MED ORDER — DILTIAZEM HCL ER COATED BEADS 180 MG PO CP24: 180.0000 mg | ORAL_CAPSULE | Freq: Every day | ORAL | Status: DC

## 2023-02-04 MED ORDER — INFLUENZA VAC A&B SURF ANT ADJ 0.5 ML IM SUSY
0.5000 mL | PREFILLED_SYRINGE | Freq: Once | INTRAMUSCULAR | Status: AC
  Administered 2023-02-04: 0.5 mL via INTRAMUSCULAR
  Filled 2023-02-04: qty 0.5

## 2023-02-04 NOTE — Progress Notes (Signed)
Patient discharging to [facility]. Report called. DC paperwork sent with pt. Patient left via stretcher with EMS with all personal belongings

## 2023-02-04 NOTE — Progress Notes (Signed)
Physical Therapy Treatment Patient Details Name: Samantha Freeman MRN: [id number] DOB: [birth year] Today's Date: 02/04/2023   History of Present Illness Patient presented to the ED 01/30/23 with complaint of progressively worsening shortness of breath for last several days.  PMH significant for obesity, DM2, HTN, HLD, sinus bradycardia, CKD, GERD/Schatzki's ring, anemia, anxiety/depression, vascular insufficiency, COVID pneumonia.    PT Comments  Pt in chair, ready for session, hoping for transition to rehab today. Stands with ease and is able to progress gait 130' then an additional 75; with RW and cga x 1.  Pt is left to self select gait distance and overestimates her abilities.  She is cued to stop for seated rest but remains highly motivated to increase strength and mobility.  She does have some decreased step length and height that increases as she fatigued which increases fall risk.  Overall progressing well towards goals.   If plan is discharge home, recommend the following: A little help with walking and/or transfers;A little help with bathing/dressing/bathroom;Assistance with cooking/housework;Assist for transportation   Can travel by private vehicle        Equipment Recommendations  None recommended by PT    Recommendations for Other Services       Precautions / Restrictions Precautions Precautions: Fall Restrictions Weight Bearing Restrictions: No     Mobility  Bed Mobility               General bed mobility comments: in chair Patient Response: Cooperative  Transfers Overall transfer level: Needs assistance Equipment used: Rolling walker (2 wheels) Transfers: Sit to/from Stand Sit to Stand: Supervision                Ambulation/Gait Ambulation/Gait assistance: Contact guard assist, Min assist Gait Distance (Feet): 130 Feet Assistive device: Rolling walker (2 wheels) Gait Pattern/deviations: Step-through pattern, Decreased step length - right, Decreased  step length - left, Decreased stride length Gait velocity: decreased     General Gait Details: 130' 70' - seated rest break required in hallway due to fatigue   Stairs             Wheelchair Mobility     Tilt Bed Tilt Bed Patient Response: Cooperative  Modified Rankin (Stroke Patients Only)       Balance Overall balance assessment: Needs assistance Sitting-balance support: Feet supported Sitting balance-Leahy Scale: Good     Standing balance support: Bilateral upper extremity supported, During functional activity, Reliant on assistive device for balance Standing balance-Leahy Scale: Fair Standing balance comment: decreaseds tep height and length, gait quality decreases as she fatigued with increased fall risk noted.                            Cognition Arousal: Alert Behavior During Therapy: WFL for tasks assessed/performed Overall Cognitive Status: Within Functional Limits for tasks assessed                                          Exercises      General Comments        Pertinent Vitals/Pain Pain Assessment Pain Assessment: No/denies pain    Home Living                          Prior Function            PT Goals (  current goals can now be found in the care plan section) Progress towards PT goals: Progressing toward goals    Frequency    Min 1X/week      PT Plan      Co-evaluation              AM-PAC PT "6 Clicks" Mobility   Outcome Measure  Help needed turning from your back to your side while in a flat bed without using bedrails?: A Little Help needed moving from lying on your back to sitting on the side of a flat bed without using bedrails?: A Little Help needed moving to and from a bed to a chair (including a wheelchair)?: A Little Help needed standing up from a chair using your arms (e.g., wheelchair or bedside chair)?: None Help needed to walk in hospital room?: A Little Help needed  climbing 3-5 steps with a railing? : A Little 6 Click Score: 19    End of Session Equipment Utilized During Treatment: Gait belt Activity Tolerance: Patient tolerated treatment well;Patient limited by fatigue Patient left: in chair;with call bell/phone within reach;with chair alarm set Nurse Communication: Mobility status PT Visit Diagnosis: Unsteadiness on feet (R26.81);Muscle weakness (generalized) (M62.81);Difficulty in walking, not elsewhere classified (R26.2)     Time: 4696-2952 PT Time Calculation (min) (ACUTE ONLY): 12 min  Charges:    $Gait Training: 8-22 mins PT General Charges $$ ACUTE PT VISIT: 1 Visit                    Danielle Dess, PTA 02/04/23, 10:11 AM

## 2023-02-04 NOTE — Care Management Important Message (Signed)
Important Message  Patient Details  Name: Samantha Freeman MRN: [id number] Date of Birth: 1935/12/03   Important Message Given:  Yes - Medicare IM     Samantha Freeman 02/04/2023, 12:18 PM

## 2023-02-04 NOTE — Discharge Summary (Signed)
Physician Discharge Summary   Patient: Samantha Freeman MRN: [id number] DOB: 03-30-1936  Admit date:     01/30/2023  Discharge date: 02/04/23  Discharge Physician: Jonah Blue   PCP: Marguarite Arbour, MD   Recommendations at discharge:   You are being discharged to [facility] for [facility] You have been started on Diltiazem and Metoprolol for heart rate control; stop amlodipine (Norvasc) and propranolol Take Eliquis twice daily Follow up with Dr. Darrold Junker in 1-2 weeks Follow with Dr. 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 She tested positive for COVID with symptoms 2 months ago and then had 2 subsequent URIs about 3 weeks apart.  She was treated with amoxil and steroids.   O2 83% on RA, placed on 4L  O2.  CTA with bronchitis.  Patient is DNR/DNI.  She was treated with steroids, bronchodilators, Mucinex, and 1 dose of Lasix.  She was subsequently noted to be in afib, which was thought to be related to her symptoms.  She was started on Cardizem drip and full-dose Lovenox.  Assessment and Plan:    New onset afib Symptoms above appear to be related to this issue Since the afib onset is unknown, will focus on rate control and searching for the underlying cause at this time. [facility] for Diltiazem drip as per protocol and has transitioned to PO Diltiazem with sustained heart rate control Echocardiogram with preserved EF, indeterminate diastolic filling Cardiology  consulting CHA2DS2-VASc Score is >2 and so patient would benefit from oral anticoagulation. There is evidence of net benefit even in the extremely elderly population. She is adamantly opposed to Coumadin, but reports that she will have difficulty affording Eliquis and is likely to need medication assistance Eliquis voucher provided for 30 day supply and cardiology/pharmacy are working with her on medication assistance paperwork   Acute respiratory failure with hypoxia, resolved Presented with several days of progressively worsening shortness of breath, not responding to Augmentin and prednisone as an outpatient Patient reports her symptoms started after her diagnosis of COVID 2 months ago. Noted to be hypoxic on ambulation requiring 4 L oxygen. On presenting exam had bilateral crackles, no wheezing No fever, WBC count normal, procalcitonin level normal, elevated BNP CT chest findings suggestive of acute bronchitis, mucous plugs as well as right heart strain/failure. Patient's symptoms were thought to be secondary to a combination of reactive airway disease as well as CHF. Given 1 dose of IV Solu-Medrol in the ED and Solu-Medrol IV 40 mg daily  Does not use oxygen at home She was subsequently found to be in afib with RVR and was given a dose of Lasix with rate control She had subsequently had resolution in respiratory symptoms and has remained comfortable on room air Given resolution of symptoms, steroids were stopped   Possible CHF exacerbation Although no prior h/o of CHF, patient reports she used to be on Lasix in the past for pedal edema. Had tachycardia, bilateral crackles on auscultation  Discharge Exam:   Subjective: Feeling ok, periodic palpitations.  Ready to go to [facility].   Objective: Vitals:   02/04/23 0830 02/04/23 1125  BP:  (!) 94/59  Pulse: 73 70  Resp: 18 18  Temp:  97.7 F (36.5 C)  SpO2: 95% 93%    Intake/Output Summary (Last 24 hours) at 02/04/2023 1144 Last data filed at 02/03/2023 1920 Gross per 24 hour  Intake 720 ml  Output --  Net 720 ml   Filed Weights   01/30/23 0805 02/01/23 0419  Weight: 83.5 kg 88.7 kg    Exam:  General:  Appears calm and comfortable and is in NAD, on RA Eyes:  EOMI, normal lids, iris ENT:  grossly normal hearing, lips & tongue, mmm Neck:  no LAD, masses or thyromegaly Cardiovascular:  Irregularly rhythm with rate in 80s. No LE edema.  Respiratory:   CTA bilaterally with no wheezes/rales/rhonchi.  Normal respiratory effort. Abdomen:  soft, NT, ND Skin:  no rash or induration seen on limited exam Musculoskeletal:  grossly normal tone BUE/BLE, good ROM, no bony abnormality Psychiatric:  grossly normal mood and affect, speech fluent and appropriate, AOx3 Neurologic:  CN 2-12 grossly intact, moves all extremities in coordinated fashion  Data Reviewed: I have reviewed the patient's lab results since admission.  Pertinent labs for today include:   None today    Condition at discharge: stable  The results of significant diagnostics from this hospitalization (including  imaging, microbiology, ancillary and laboratory) are listed below for reference.   Imaging Studies: ECHOCARDIOGRAM COMPLETE  Result Date: 01/31/2023    ECHOCARDIOGRAM REPORT   Patient Name:   Samantha Freeman Date of Exam: 01/30/2023 Medical Rec #:  [id number]       Height:       64.0 in Accession #:    [id number]      Weight:       184.0 lb Date of Birth:  02-07-1936        BSA:          1.888 m Patient Age:    87 years        BP:           131/90 mmHg Patient Gender: F               HR:           133 bpm. Exam Location:  [facility] Procedure: 2D Echo, Cardiac Doppler and Color Doppler Indications:     R91.31 Abnormal EKG  History:         Patient has no prior history of Echocardiogram examinations.                  Signs/Symptoms:Dyspnea and Murmur; Risk Factors:Hypertension,                  Diabetes and Dyslipidemia. COVID-19. Pneumonia.  Sonographer:     Daphine Deutscher RDCS Referring Phys:  [id number] [facility] Diagnosing Phys: Marcina Millard MD IMPRESSIONS  1. Left ventricular ejection fraction, by estimation, is 60 to 65%. The left ventricle has normal function. The left ventricle has no regional wall motion abnormalities. There is moderate left ventricular hypertrophy. Indeterminate diastolic filling due  to E-A fusion.  2. Right ventricular systolic function is normal. The right ventricular size is normal.  3. Left atrial size was moderately dilated.  4. Right atrial size was mildly dilated.  5. The mitral valve is normal in structure. Mild mitral valve regurgitation. No evidence of mitral stenosis.  Discharge Exam:   Subjective: Feeling ok, periodic palpitations.  Ready to go to [facility].   Objective: Vitals:   02/04/23 0830 02/04/23 1125  BP:  (!) 94/59  Pulse: 73 70  Resp: 18 18  Temp:  97.7 F (36.5 C)  SpO2: 95% 93%    Intake/Output Summary (Last 24 hours) at 02/04/2023 1144 Last data filed at 02/03/2023 1920 Gross per 24 hour  Intake 720 ml  Output --  Net 720 ml   Filed Weights   01/30/23 0805 02/01/23 0419  Weight: 83.5 kg 88.7 kg    Exam:  General:  Appears calm and comfortable and is in NAD, on RA Eyes:  EOMI, normal lids, iris ENT:  grossly normal hearing, lips & tongue, mmm Neck:  no LAD, masses or thyromegaly Cardiovascular:  Irregularly rhythm with rate in 80s. No LE edema.  Respiratory:   CTA bilaterally with no wheezes/rales/rhonchi.  Normal respiratory effort. Abdomen:  soft, NT, ND Skin:  no rash or induration seen on limited exam Musculoskeletal:  grossly normal tone BUE/BLE, good ROM, no bony abnormality Psychiatric:  grossly normal mood and affect, speech fluent and appropriate, AOx3 Neurologic:  CN 2-12 grossly intact, moves all extremities in coordinated fashion  Data Reviewed: I have reviewed the patient's lab results since admission.  Pertinent labs for today include:   None today    Condition at discharge: stable  The results of significant diagnostics from this hospitalization (including  imaging, microbiology, ancillary and laboratory) are listed below for reference.   Imaging Studies: ECHOCARDIOGRAM COMPLETE  Result Date: 01/31/2023    ECHOCARDIOGRAM REPORT   Patient Name:   Samantha Freeman Date of Exam: 01/30/2023 Medical Rec #:  [id number]       Height:       64.0 in Accession #:    [id number]      Weight:       184.0 lb Date of Birth:  02-07-1936        BSA:          1.888 m Patient Age:    87 years        BP:           131/90 mmHg Patient Gender: F               HR:           133 bpm. Exam Location:  [facility] Procedure: 2D Echo, Cardiac Doppler and Color Doppler Indications:     R91.31 Abnormal EKG  History:         Patient has no prior history of Echocardiogram examinations.                  Signs/Symptoms:Dyspnea and Murmur; Risk Factors:Hypertension,                  Diabetes and Dyslipidemia. COVID-19. Pneumonia.  Sonographer:     Daphine Deutscher RDCS Referring Phys:  [id number] [facility] Diagnosing Phys: Marcina Millard MD IMPRESSIONS  1. Left ventricular ejection fraction, by estimation, is 60 to 65%. The left ventricle has normal function. The left ventricle has no regional wall motion abnormalities. There is moderate left ventricular hypertrophy. Indeterminate diastolic filling due  to E-A fusion.  2. Right ventricular systolic function is normal. The right ventricular size is normal.  3. Left atrial size was moderately dilated.  4. Right atrial size was mildly dilated.  5. The mitral valve is normal in structure. Mild mitral valve regurgitation. No evidence of mitral stenosis.  Discharge Exam:   Subjective: Feeling ok, periodic palpitations.  Ready to go to [facility].   Objective: Vitals:   02/04/23 0830 02/04/23 1125  BP:  (!) 94/59  Pulse: 73 70  Resp: 18 18  Temp:  97.7 F (36.5 C)  SpO2: 95% 93%    Intake/Output Summary (Last 24 hours) at 02/04/2023 1144 Last data filed at 02/03/2023 1920 Gross per 24 hour  Intake 720 ml  Output --  Net 720 ml   Filed Weights   01/30/23 0805 02/01/23 0419  Weight: 83.5 kg 88.7 kg    Exam:  General:  Appears calm and comfortable and is in NAD, on RA Eyes:  EOMI, normal lids, iris ENT:  grossly normal hearing, lips & tongue, mmm Neck:  no LAD, masses or thyromegaly Cardiovascular:  Irregularly rhythm with rate in 80s. No LE edema.  Respiratory:   CTA bilaterally with no wheezes/rales/rhonchi.  Normal respiratory effort. Abdomen:  soft, NT, ND Skin:  no rash or induration seen on limited exam Musculoskeletal:  grossly normal tone BUE/BLE, good ROM, no bony abnormality Psychiatric:  grossly normal mood and affect, speech fluent and appropriate, AOx3 Neurologic:  CN 2-12 grossly intact, moves all extremities in coordinated fashion  Data Reviewed: I have reviewed the patient's lab results since admission.  Pertinent labs for today include:   None today    Condition at discharge: stable  The results of significant diagnostics from this hospitalization (including  imaging, microbiology, ancillary and laboratory) are listed below for reference.   Imaging Studies: ECHOCARDIOGRAM COMPLETE  Result Date: 01/31/2023    ECHOCARDIOGRAM REPORT   Patient Name:   Samantha Freeman Date of Exam: 01/30/2023 Medical Rec #:  [id number]       Height:       64.0 in Accession #:    [id number]      Weight:       184.0 lb Date of Birth:  02-07-1936        BSA:          1.888 m Patient Age:    87 years        BP:           131/90 mmHg Patient Gender: F               HR:           133 bpm. Exam Location:  [facility] Procedure: 2D Echo, Cardiac Doppler and Color Doppler Indications:     R91.31 Abnormal EKG  History:         Patient has no prior history of Echocardiogram examinations.                  Signs/Symptoms:Dyspnea and Murmur; Risk Factors:Hypertension,                  Diabetes and Dyslipidemia. COVID-19. Pneumonia.  Sonographer:     Daphine Deutscher RDCS Referring Phys:  [id number] [facility] Diagnosing Phys: Marcina Millard MD IMPRESSIONS  1. Left ventricular ejection fraction, by estimation, is 60 to 65%. The left ventricle has normal function. The left ventricle has no regional wall motion abnormalities. There is moderate left ventricular hypertrophy. Indeterminate diastolic filling due  to E-A fusion.  2. Right ventricular systolic function is normal. The right ventricular size is normal.  3. Left atrial size was moderately dilated.  4. Right atrial size was mildly dilated.  5. The mitral valve is normal in structure. Mild mitral valve regurgitation. No evidence of mitral stenosis.  Discharge Exam:   Subjective: Feeling ok, periodic palpitations.  Ready to go to [facility].   Objective: Vitals:   02/04/23 0830 02/04/23 1125  BP:  (!) 94/59  Pulse: 73 70  Resp: 18 18  Temp:  97.7 F (36.5 C)  SpO2: 95% 93%    Intake/Output Summary (Last 24 hours) at 02/04/2023 1144 Last data filed at 02/03/2023 1920 Gross per 24 hour  Intake 720 ml  Output --  Net 720 ml   Filed Weights   01/30/23 0805 02/01/23 0419  Weight: 83.5 kg 88.7 kg    Exam:  General:  Appears calm and comfortable and is in NAD, on RA Eyes:  EOMI, normal lids, iris ENT:  grossly normal hearing, lips & tongue, mmm Neck:  no LAD, masses or thyromegaly Cardiovascular:  Irregularly rhythm with rate in 80s. No LE edema.  Respiratory:   CTA bilaterally with no wheezes/rales/rhonchi.  Normal respiratory effort. Abdomen:  soft, NT, ND Skin:  no rash or induration seen on limited exam Musculoskeletal:  grossly normal tone BUE/BLE, good ROM, no bony abnormality Psychiatric:  grossly normal mood and affect, speech fluent and appropriate, AOx3 Neurologic:  CN 2-12 grossly intact, moves all extremities in coordinated fashion  Data Reviewed: I have reviewed the patient's lab results since admission.  Pertinent labs for today include:   None today    Condition at discharge: stable  The results of significant diagnostics from this hospitalization (including  imaging, microbiology, ancillary and laboratory) are listed below for reference.   Imaging Studies: ECHOCARDIOGRAM COMPLETE  Result Date: 01/31/2023    ECHOCARDIOGRAM REPORT   Patient Name:   Samantha Freeman Date of Exam: 01/30/2023 Medical Rec #:  [id number]       Height:       64.0 in Accession #:    [id number]      Weight:       184.0 lb Date of Birth:  02-07-1936        BSA:          1.888 m Patient Age:    87 years        BP:           131/90 mmHg Patient Gender: F               HR:           133 bpm. Exam Location:  [facility] Procedure: 2D Echo, Cardiac Doppler and Color Doppler Indications:     R91.31 Abnormal EKG  History:         Patient has no prior history of Echocardiogram examinations.                  Signs/Symptoms:Dyspnea and Murmur; Risk Factors:Hypertension,                  Diabetes and Dyslipidemia. COVID-19. Pneumonia.  Sonographer:     Daphine Deutscher RDCS Referring Phys:  [id number] [facility] Diagnosing Phys: Marcina Millard MD IMPRESSIONS  1. Left ventricular ejection fraction, by estimation, is 60 to 65%. The left ventricle has normal function. The left ventricle has no regional wall motion abnormalities. There is moderate left ventricular hypertrophy. Indeterminate diastolic filling due  to E-A fusion.  2. Right ventricular systolic function is normal. The right ventricular size is normal.  3. Left atrial size was moderately dilated.  4. Right atrial size was mildly dilated.  5. The mitral valve is normal in structure. Mild mitral valve regurgitation. No evidence of mitral stenosis.  Physician Discharge Summary   Patient: Samantha Freeman MRN: [id number] DOB: 03-30-1936  Admit date:     01/30/2023  Discharge date: 02/04/23  Discharge Physician: Jonah Blue   PCP: Marguarite Arbour, MD   Recommendations at discharge:   You are being discharged to [facility] for rehabilitation You have been started on Diltiazem and Metoprolol for heart rate control; stop amlodipine (Norvasc) and propranolol Take Eliquis twice daily Follow up with Dr. Darrold Junker in 1-2 weeks Follow with Dr. 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 She tested positive for COVID with symptoms 2 months ago and then had 2 subsequent URIs about 3 weeks apart.  She was treated with amoxil and steroids.   O2 83% on RA, placed on 4L  O2.  CTA with bronchitis.  Patient is DNR/DNI.  She was treated with steroids, bronchodilators, Mucinex, and 1 dose of Lasix.  She was subsequently noted to be in afib, which was thought to be related to her symptoms.  She was started on Cardizem drip and full-dose Lovenox.  Assessment and Plan:    New onset afib Symptoms above appear to be related to this issue Since the afib onset is unknown, will focus on rate control and searching for the underlying cause at this time. [facility]p as per protocol and has transitioned to PO Diltiazem with sustained heart rate control Echocardiogram with preserved EF, indeterminate diastolic filling Cardiology  consulting CHA2DS2-VASc Score is >2 and so patient would benefit from oral anticoagulation. There is evidence of net benefit even in the extremely elderly population. She is adamantly opposed to Coumadin, but reports that she will have difficulty affording Eliquis and is likely to need medication assistance Eliquis voucher provided for 30 day supply and cardiology/pharmacy are working with her on medication assistance paperwork   Acute respiratory failure with hypoxia, resolved Presented with several days of progressively worsening shortness of breath, not responding to Augmentin and prednisone as an outpatient Patient reports her symptoms started after her diagnosis of COVID 2 months ago. Noted to be hypoxic on ambulation requiring 4 L oxygen. On presenting exam had bilateral crackles, no wheezing No fever, WBC count normal, procalcitonin level normal, elevated BNP CT chest findings suggestive of acute bronchitis, mucous plugs as well as right heart strain/failure. Patient's symptoms were thought to be secondary to a combination of reactive airway disease as well as CHF. Given 1 dose of IV Solu-Medrol in the ED and Solu-Medrol IV 40 mg daily  Does not use oxygen at home She was subsequently found to be in afib with RVR and was given a dose of Lasix with rate control She had subsequently had resolution in respiratory symptoms and has remained comfortable on room air Given resolution of symptoms, steroids were stopped   Possible CHF exacerbation Although no prior h/o of CHF, patient reports she used to be on Lasix in the past for pedal edema. Had tachycardia, bilateral crackles on auscultation  Physician Discharge Summary   Patient: Samantha Freeman MRN: [id number] DOB: 03-30-1936  Admit date:     01/30/2023  Discharge date: 02/04/23  Discharge Physician: Jonah Blue   PCP: Marguarite Arbour, MD   Recommendations at discharge:   You are being discharged to [facility] for rehabilitation You have been started on Diltiazem and Metoprolol for heart rate control; stop amlodipine (Norvasc) and propranolol Take Eliquis twice daily Follow up with Dr. Darrold Junker in 1-2 weeks Follow with Dr. 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 She tested positive for COVID with symptoms 2 months ago and then had 2 subsequent URIs about 3 weeks apart.  She was treated with amoxil and steroids.   O2 83% on RA, placed on 4L  O2.  CTA with bronchitis.  Patient is DNR/DNI.  She was treated with steroids, bronchodilators, Mucinex, and 1 dose of Lasix.  She was subsequently noted to be in afib, which was thought to be related to her symptoms.  She was started on Cardizem drip and full-dose Lovenox.  Assessment and Plan:    New onset afib Symptoms above appear to be related to this issue Since the afib onset is unknown, will focus on rate control and searching for the underlying cause at this time. [facility]em drip as per protocol and has transitioned to PO Diltiazem with sustained heart rate control Echocardiogram with preserved EF, indeterminate diastolic filling Cardiology  consulting CHA2DS2-VASc Score is >2 and so patient would benefit from oral anticoagulation. There is evidence of net benefit even in the extremely elderly population. She is adamantly opposed to Coumadin, but reports that she will have difficulty affording Eliquis and is likely to need medication assistance Eliquis voucher provided for 30 day supply and cardiology/pharmacy are working with her on medication assistance paperwork   Acute respiratory failure with hypoxia, resolved Presented with several days of progressively worsening shortness of breath, not responding to Augmentin and prednisone as an outpatient Patient reports her symptoms started after her diagnosis of COVID 2 months ago. Noted to be hypoxic on ambulation requiring 4 L oxygen. On presenting exam had bilateral crackles, no wheezing No fever, WBC count normal, procalcitonin level normal, elevated BNP CT chest findings suggestive of acute bronchitis, mucous plugs as well as right heart strain/failure. Patient's symptoms were thought to be secondary to a combination of reactive airway disease as well as CHF. Given 1 dose of IV Solu-Medrol in the ED and Solu-Medrol IV 40 mg daily  Does not use oxygen at home She was subsequently found to be in afib with RVR and was given a dose of Lasix with rate control She had subsequently had resolution in respiratory symptoms and has remained comfortable on room air Given resolution of symptoms, steroids were stopped   Possible CHF exacerbation Although no prior h/o of CHF, patient reports she used to be on Lasix in the past for pedal edema. Had tachycardia, bilateral crackles on auscultation

## 2023-02-04 NOTE — TOC Transition Note (Signed)
Transition of Care [facility]) - CM/SW Discharge Note   Patient Details  Name: Samantha Freeman MRN: [id number] Date of Birth: Jan 03, 1936  Transition of Care [facility]) CM/SW Contact:  Darolyn Rua, LCSW Phone Number: 02/04/2023, 11:48 AM   Clinical Narrative:     Patient will DC to: [facility] Anticipated DC date: 10/14 Family notified:son Casimiro Needle Transport by: Wendie Simmer  Per MD patient ready for DC to [facility]  . RN, patient, patient's family, and facility notified of DC. Discharge Summary sent to facility. RN given number for report   [phone] Room 707. DC packet on chart. Ambulance transport requested for patient.  CSW signing off.    Final next level of care: Skilled Nursing Facility Barriers to Discharge: No Barriers Identified   Patient Goals and CMS Choice CMS Medicare.gov Compare Post Acute Care list provided to:: Patient Choice offered to / list presented to : Patient  Discharge Placement                         Discharge Plan and Services Additional resources added to the After Visit Summary for                                       Social Determinants of Health (SDOH) Interventions SDOH Screenings   Food Insecurity: No Food Insecurity (01/30/2023)  Housing: Low Risk  (01/30/2023)  Transportation Needs: No Transportation Needs (01/30/2023)  Utilities: Not At Risk (01/30/2023)  Tobacco Use: Low Risk  (01/16/2023)   Received from [facility]     Readmission Risk Interventions     No data to display

## 2023-02-25 ENCOUNTER — Emergency Department: Payer: Medicare Other

## 2023-02-25 ENCOUNTER — Inpatient Hospital Stay
Admission: EM | Admit: 2023-02-25 | Discharge: 2023-03-12 | DRG: 308 | Disposition: A | Discharge status: Skilled Nursing Facility | Payer: Medicare Other | Attending: Internal Medicine | Admitting: Internal Medicine

## 2023-02-25 ENCOUNTER — Other Ambulatory Visit: Payer: Self-pay

## 2023-02-25 DIAGNOSIS — J9601 Acute respiratory failure with hypoxia: Secondary | ICD-10-CM | POA: Diagnosis present

## 2023-02-25 DIAGNOSIS — Z794 Long term (current) use of insulin: Secondary | ICD-10-CM

## 2023-02-25 DIAGNOSIS — N1831 Chronic kidney disease, stage 3a: Secondary | ICD-10-CM

## 2023-02-25 DIAGNOSIS — E1122 Type 2 diabetes mellitus with diabetic chronic kidney disease: Secondary | ICD-10-CM

## 2023-02-25 DIAGNOSIS — E1169 Type 2 diabetes mellitus with other specified complication: Secondary | ICD-10-CM | POA: Diagnosis present

## 2023-02-25 DIAGNOSIS — Z79899 Other long term (current) drug therapy: Secondary | ICD-10-CM

## 2023-02-25 DIAGNOSIS — E876 Hypokalemia: Secondary | ICD-10-CM | POA: Diagnosis not present

## 2023-02-25 DIAGNOSIS — I10 Essential (primary) hypertension: Secondary | ICD-10-CM

## 2023-02-25 DIAGNOSIS — E669 Obesity, unspecified: Secondary | ICD-10-CM | POA: Diagnosis present

## 2023-02-25 DIAGNOSIS — E1165 Type 2 diabetes mellitus with hyperglycemia: Secondary | ICD-10-CM | POA: Diagnosis present

## 2023-02-25 DIAGNOSIS — I5031 Acute diastolic (congestive) heart failure: Secondary | ICD-10-CM

## 2023-02-25 DIAGNOSIS — E119 Type 2 diabetes mellitus without complications: Secondary | ICD-10-CM

## 2023-02-25 DIAGNOSIS — K219 Gastro-esophageal reflux disease without esophagitis: Secondary | ICD-10-CM | POA: Diagnosis present

## 2023-02-25 DIAGNOSIS — I350 Nonrheumatic aortic (valve) stenosis: Secondary | ICD-10-CM | POA: Diagnosis present

## 2023-02-25 DIAGNOSIS — R040 Epistaxis: Secondary | ICD-10-CM | POA: Diagnosis not present

## 2023-02-25 DIAGNOSIS — Z7901 Long term (current) use of anticoagulants: Secondary | ICD-10-CM | POA: Diagnosis not present

## 2023-02-25 DIAGNOSIS — Z96612 Presence of left artificial shoulder joint: Secondary | ICD-10-CM | POA: Diagnosis present

## 2023-02-25 DIAGNOSIS — Z6829 Body mass index (BMI) 29.0-29.9, adult: Secondary | ICD-10-CM

## 2023-02-25 DIAGNOSIS — Z96651 Presence of right artificial knee joint: Secondary | ICD-10-CM | POA: Diagnosis present

## 2023-02-25 DIAGNOSIS — N179 Acute kidney failure, unspecified: Secondary | ICD-10-CM | POA: Diagnosis not present

## 2023-02-25 DIAGNOSIS — R079 Chest pain, unspecified: Secondary | ICD-10-CM

## 2023-02-25 DIAGNOSIS — Z947 Corneal transplant status: Secondary | ICD-10-CM

## 2023-02-25 DIAGNOSIS — E7800 Pure hypercholesterolemia, unspecified: Secondary | ICD-10-CM | POA: Diagnosis present

## 2023-02-25 DIAGNOSIS — I130 Hypertensive heart and chronic kidney disease with heart failure and stage 1 through stage 4 chronic kidney disease, or unspecified chronic kidney disease: Secondary | ICD-10-CM | POA: Diagnosis present

## 2023-02-25 DIAGNOSIS — F32A Depression, unspecified: Secondary | ICD-10-CM | POA: Diagnosis present

## 2023-02-25 DIAGNOSIS — F419 Anxiety disorder, unspecified: Secondary | ICD-10-CM | POA: Diagnosis present

## 2023-02-25 DIAGNOSIS — Z8616 Personal history of COVID-19: Secondary | ICD-10-CM

## 2023-02-25 DIAGNOSIS — R531 Weakness: Secondary | ICD-10-CM | POA: Diagnosis present

## 2023-02-25 DIAGNOSIS — G8929 Other chronic pain: Secondary | ICD-10-CM | POA: Diagnosis present

## 2023-02-25 DIAGNOSIS — Z66 Do not resuscitate: Secondary | ICD-10-CM | POA: Diagnosis present

## 2023-02-25 DIAGNOSIS — J810 Acute pulmonary edema: Secondary | ICD-10-CM

## 2023-02-25 DIAGNOSIS — T502X5A Adverse effect of carbonic-anhydrase inhibitors, benzothiadiazides and other diuretics, initial encounter: Secondary | ICD-10-CM | POA: Diagnosis not present

## 2023-02-25 DIAGNOSIS — I482 Chronic atrial fibrillation, unspecified: Secondary | ICD-10-CM | POA: Diagnosis present

## 2023-02-25 DIAGNOSIS — I5033 Acute on chronic diastolic (congestive) heart failure: Secondary | ICD-10-CM | POA: Diagnosis present

## 2023-02-25 DIAGNOSIS — I4891 Unspecified atrial fibrillation: Secondary | ICD-10-CM | POA: Diagnosis not present

## 2023-02-25 DIAGNOSIS — Z888 Allergy status to other drugs, medicaments and biological substances status: Secondary | ICD-10-CM

## 2023-02-25 DIAGNOSIS — Z751 Person awaiting admission to adequate facility elsewhere: Secondary | ICD-10-CM

## 2023-02-25 DIAGNOSIS — E871 Hypo-osmolality and hyponatremia: Secondary | ICD-10-CM | POA: Diagnosis not present

## 2023-02-25 DIAGNOSIS — Z7982 Long term (current) use of aspirin: Secondary | ICD-10-CM | POA: Diagnosis not present

## 2023-02-25 DIAGNOSIS — Z885 Allergy status to narcotic agent status: Secondary | ICD-10-CM

## 2023-02-25 DIAGNOSIS — R0609 Other forms of dyspnea: Secondary | ICD-10-CM

## 2023-02-25 DIAGNOSIS — R5383 Other fatigue: Secondary | ICD-10-CM

## 2023-02-25 LAB — URINALYSIS, W/ REFLEX TO CULTURE (INFECTION SUSPECTED)
Bacteria, UA: NONE SEEN
Bilirubin Urine: NEGATIVE
Glucose, UA: >=500 mg/dL — AB
Hgb urine dipstick: NEGATIVE
Ketones, ur: NEGATIVE mg/dL
Nitrite: NEGATIVE
Protein, ur: NEGATIVE mg/dL
Specific Gravity, Urine: 1.02 (ref 1.005–1.030)
pH: 6 (ref 5.0–8.0)

## 2023-02-25 LAB — CBC
HCT: 44.2 % (ref 36.0–46.0)
Hemoglobin: 14.3 g/dL (ref 12.0–15.0)
MCH: 29.9 pg (ref 26.0–34.0)
MCHC: 32.4 g/dL (ref 30.0–36.0)
MCV: 92.5 fL (ref 80.0–100.0)
Platelets: 281 10*3/uL (ref 150–400)
RBC: 4.78 MIL/uL (ref 3.87–5.11)
RDW: 15.4 % (ref 11.5–15.5)
WBC: 11.1 10*3/uL — ABNORMAL HIGH (ref 4.0–10.5)
nRBC: 0 % (ref 0.0–0.2)

## 2023-02-25 LAB — BASIC METABOLIC PANEL
Anion gap: 10 (ref 5–15)
BUN: 28 mg/dL — ABNORMAL HIGH (ref 8–23)
CO2: 26 mmol/L (ref 22–32)
Calcium: 9.4 mg/dL (ref 8.9–10.3)
Chloride: 102 mmol/L (ref 98–111)
Creatinine, Ser: 0.99 mg/dL (ref 0.44–1.00)
GFR, Estimated: 56 mL/min — ABNORMAL LOW (ref >60)
Glucose, Bld: 86 mg/dL (ref 70–99)
Potassium: 3.8 mmol/L (ref 3.5–5.1)
Sodium: 138 mmol/L (ref 135–145)

## 2023-02-25 LAB — MAGNESIUM: Magnesium: 2.1 mg/dL (ref 1.7–2.4)

## 2023-02-25 LAB — TROPONIN I (HIGH SENSITIVITY)
Troponin I (High Sensitivity): 7 ng/L (ref <18)
Troponin I (High Sensitivity): 7 ng/L (ref <18)

## 2023-02-25 LAB — BRAIN NATRIURETIC PEPTIDE: B Natriuretic Peptide: 512.7 pg/mL — ABNORMAL HIGH (ref 0.0–100.0)

## 2023-02-25 LAB — GLUCOSE, CAPILLARY: Glucose-Capillary: 169 mg/dL — ABNORMAL HIGH (ref 70–99)

## 2023-02-25 LAB — CBG MONITORING, ED: Glucose-Capillary: 101 mg/dL — ABNORMAL HIGH (ref 70–99)

## 2023-02-25 MED ORDER — HYDROXYZINE HCL 25 MG PO TABS
25.0000 mg | ORAL_TABLET | Freq: Two times a day (BID) | ORAL | Status: DC
Start: 2023-02-25 — End: 2023-03-12
  Administered 2023-02-25 – 2023-03-12 (×30): 25 mg via ORAL
  Filled 2023-02-25 (×30): qty 1

## 2023-02-25 MED ORDER — DILTIAZEM HCL 25 MG/5ML IV SOLN
10.0000 mg | Freq: Once | INTRAVENOUS | Status: AC
  Administered 2023-02-25: 10 mg via INTRAVENOUS
  Filled 2023-02-25: qty 5

## 2023-02-25 MED ORDER — PREDNISOLONE ACETATE 1 % OP SUSP
1.0000 [drp] | Freq: Every day | OPHTHALMIC | Status: DC
Start: 2023-02-25 — End: 2023-03-12
  Administered 2023-02-25 – 2023-03-11 (×15): 1 [drp] via OPHTHALMIC
  Filled 2023-02-25: qty 1

## 2023-02-25 MED ORDER — ENALAPRIL MALEATE 10 MG PO TABS
20.0000 mg | ORAL_TABLET | Freq: Two times a day (BID) | ORAL | Status: DC
  Administered 2023-02-26 – 2023-02-27 (×3): 20 mg via ORAL
  Filled 2023-02-25 (×2): qty 2
  Filled 2023-02-25: qty 1
  Filled 2023-02-25: qty 2
  Filled 2023-02-25: qty 1

## 2023-02-25 MED ORDER — DAPAGLIFLOZIN PROPANEDIOL 10 MG PO TABS
10.0000 mg | ORAL_TABLET | Freq: Every day | ORAL | Status: DC
  Administered 2023-02-26 – 2023-03-12 (×15): 10 mg via ORAL
  Filled 2023-02-25 (×15): qty 1

## 2023-02-25 MED ORDER — INSULIN ASPART 100 UNIT/ML IJ SOLN
0.0000 [IU] | Freq: Three times a day (TID) | INTRAMUSCULAR | Status: DC
  Administered 2023-02-26: 2 [IU] via SUBCUTANEOUS
  Administered 2023-02-26: 3 [IU] via SUBCUTANEOUS
  Administered 2023-02-26 – 2023-02-27 (×3): 5 [IU] via SUBCUTANEOUS
  Administered 2023-02-27: 3 [IU] via SUBCUTANEOUS
  Administered 2023-02-28: 11 [IU] via SUBCUTANEOUS
  Administered 2023-02-28 (×2): 5 [IU] via SUBCUTANEOUS
  Administered 2023-03-01 (×2): 8 [IU] via SUBCUTANEOUS
  Administered 2023-03-01: 5 [IU] via SUBCUTANEOUS
  Administered 2023-03-02 – 2023-03-03 (×4): 8 [IU] via SUBCUTANEOUS
  Administered 2023-03-03 – 2023-03-04 (×2): 5 [IU] via SUBCUTANEOUS
  Administered 2023-03-04 (×2): 8 [IU] via SUBCUTANEOUS
  Administered 2023-03-05: 5 [IU] via SUBCUTANEOUS
  Administered 2023-03-05: 3 [IU] via SUBCUTANEOUS
  Administered 2023-03-05 – 2023-03-06 (×2): 5 [IU] via SUBCUTANEOUS
  Administered 2023-03-06: 15 [IU] via SUBCUTANEOUS
  Administered 2023-03-06: 8 [IU] via SUBCUTANEOUS
  Administered 2023-03-07: 5 [IU] via SUBCUTANEOUS
  Administered 2023-03-07 – 2023-03-08 (×3): 8 [IU] via SUBCUTANEOUS
  Administered 2023-03-08: 5 [IU] via SUBCUTANEOUS
  Administered 2023-03-09 (×2): 8 [IU] via SUBCUTANEOUS
  Administered 2023-03-09: 3 [IU] via SUBCUTANEOUS
  Administered 2023-03-10: 2 [IU] via SUBCUTANEOUS
  Administered 2023-03-10: 8 [IU] via SUBCUTANEOUS
  Administered 2023-03-11: 11 [IU] via SUBCUTANEOUS
  Administered 2023-03-11: 5 [IU] via SUBCUTANEOUS
  Administered 2023-03-11: 3 [IU] via SUBCUTANEOUS
  Administered 2023-03-12 (×2): 5 [IU] via SUBCUTANEOUS
  Filled 2023-02-25 (×39): qty 1

## 2023-02-25 MED ORDER — ALPRAZOLAM 0.5 MG PO TABS
0.5000 mg | ORAL_TABLET | Freq: Every day | ORAL | Status: DC
Start: 2023-02-25 — End: 2023-03-12
  Administered 2023-02-25 – 2023-03-11 (×15): 0.5 mg via ORAL
  Filled 2023-02-25 (×15): qty 1

## 2023-02-25 MED ORDER — ENOXAPARIN SODIUM 40 MG/0.4ML IJ SOSY
40.0000 mg | PREFILLED_SYRINGE | INTRAMUSCULAR | Status: DC
Start: 2023-02-25 — End: 2023-03-02
  Administered 2023-02-25 – 2023-03-01 (×5): 40 mg via SUBCUTANEOUS
  Filled 2023-02-25 (×5): qty 0.4

## 2023-02-25 MED ORDER — METOPROLOL TARTRATE 25 MG PO TABS
25.0000 mg | ORAL_TABLET | Freq: Two times a day (BID) | ORAL | Status: DC
  Administered 2023-02-25 – 2023-03-06 (×18): 25 mg via ORAL
  Filled 2023-02-25 (×18): qty 1

## 2023-02-25 MED ORDER — DILTIAZEM HCL 60 MG PO TABS
60.0000 mg | ORAL_TABLET | Freq: Once | ORAL | Status: AC
  Administered 2023-02-25: 60 mg via ORAL
  Filled 2023-02-25: qty 1

## 2023-02-25 MED ORDER — TRAZODONE HCL 50 MG PO TABS
50.0000 mg | ORAL_TABLET | Freq: Every evening | ORAL | Status: DC | PRN
  Administered 2023-03-01 – 2023-03-09 (×5): 50 mg via ORAL
  Filled 2023-02-25 (×6): qty 1

## 2023-02-25 MED ORDER — ROSUVASTATIN CALCIUM 10 MG PO TABS
10.0000 mg | ORAL_TABLET | Freq: Every day | ORAL | Status: DC
  Administered 2023-02-25 – 2023-03-11 (×15): 10 mg via ORAL
  Filled 2023-02-25 (×17): qty 1

## 2023-02-25 MED ORDER — VITAMIN B-12 1000 MCG PO TABS
1000.0000 ug | ORAL_TABLET | Freq: Every day | ORAL | Status: DC
  Administered 2023-02-26 – 2023-03-12 (×15): 1000 ug via ORAL
  Filled 2023-02-25 (×15): qty 1

## 2023-02-25 MED ORDER — ONDANSETRON HCL 4 MG/2ML IJ SOLN: 4.0000 mg | Freq: Four times a day (QID) | INTRAMUSCULAR | Status: DC | PRN

## 2023-02-25 MED ORDER — ACETAMINOPHEN 325 MG PO TABS
650.0000 mg | ORAL_TABLET | ORAL | Status: DC | PRN
Start: 2023-02-25 — End: 2023-03-12
  Administered 2023-02-27 – 2023-03-11 (×10): 650 mg via ORAL
  Filled 2023-02-25 (×10): qty 2

## 2023-02-25 MED ORDER — DILTIAZEM HCL-DEXTROSE 125-5 MG/125ML-% IV SOLN (PREMIX)
5.0000 mg/h | INTRAVENOUS | Status: DC
  Administered 2023-02-25: 5 mg/h via INTRAVENOUS
  Administered 2023-02-26: 10 mg/h via INTRAVENOUS
  Administered 2023-02-27 – 2023-02-28 (×2): 5 mg/h via INTRAVENOUS
  Filled 2023-02-25 (×3): qty 125

## 2023-02-25 MED ORDER — FENOFIBRATE 54 MG PO TABS
54.0000 mg | ORAL_TABLET | Freq: Every day | ORAL | Status: DC
  Administered 2023-02-26 – 2023-03-12 (×15): 54 mg via ORAL
  Filled 2023-02-25 (×15): qty 1

## 2023-02-25 MED ORDER — INSULIN GLARGINE-YFGN 100 UNIT/ML ~~LOC~~ SOLN
18.0000 [IU] | Freq: Every day | SUBCUTANEOUS | Status: DC
  Administered 2023-02-26 – 2023-02-27 (×2): 18 [IU] via SUBCUTANEOUS
  Filled 2023-02-25 (×4): qty 0.18

## 2023-02-25 MED ORDER — GABAPENTIN 100 MG PO CAPS
200.0000 mg | ORAL_CAPSULE | Freq: Three times a day (TID) | ORAL | Status: DC
  Administered 2023-02-25 – 2023-03-12 (×44): 200 mg via ORAL
  Filled 2023-02-25 (×44): qty 2

## 2023-02-25 MED ORDER — MAGNESIUM OXIDE -MG SUPPLEMENT 400 (240 MG) MG PO TABS
800.0000 mg | ORAL_TABLET | Freq: Three times a day (TID) | ORAL | Status: DC
  Administered 2023-02-25 – 2023-03-01 (×11): 800 mg via ORAL
  Filled 2023-02-25 (×11): qty 2

## 2023-02-25 MED ORDER — PANTOPRAZOLE SODIUM 40 MG PO TBEC
40.0000 mg | DELAYED_RELEASE_TABLET | Freq: Every day | ORAL | Status: DC
  Administered 2023-02-26 – 2023-03-12 (×15): 40 mg via ORAL
  Filled 2023-02-25 (×15): qty 1

## 2023-02-25 MED ORDER — IOHEXOL 350 MG/ML SOLN
75.0000 mL | Freq: Once | INTRAVENOUS | Status: AC | PRN
  Administered 2023-02-25: 75 mL via INTRAVENOUS

## 2023-02-25 MED ORDER — FUROSEMIDE 10 MG/ML IJ SOLN
40.0000 mg | Freq: Every day | INTRAMUSCULAR | Status: DC
  Administered 2023-02-26 – 2023-02-28 (×3): 40 mg via INTRAVENOUS
  Filled 2023-02-25 (×3): qty 4

## 2023-02-25 MED ORDER — FUROSEMIDE 10 MG/ML IJ SOLN
40.0000 mg | Freq: Once | INTRAMUSCULAR | Status: AC
  Administered 2023-02-25: 40 mg via INTRAVENOUS
  Filled 2023-02-25: qty 4

## 2023-02-25 NOTE — Assessment & Plan Note (Addendum)
-   Hold home amlodipine given currently on diltiazem - Continue home enalapril

## 2023-02-25 NOTE — ED Provider Notes (Signed)
[facility] Provider Note    Event Date/Time   First MD Initiated Contact with Patient 02/25/23 1051     (approximate)   History   Atrial Fibrillation   HPI  Samantha Freeman is a 87 y.o. female   Past medical history of atrial fibrillation on Eliquis, insulin-dependent diabetic type II, hypertension, anxiety and depression, here with ongoing generalized weakness and fatigue.  Has chest discomfort and shortness of breath that is longstanding and unchanged.  Feels slightly worse when she lays flat.  No significant weight changes or edema.  No respiratory infectious symptoms.  Infection feels about the same as she has in the last several weeks with ongoing generalized weakness, fatigue, that she had attributed to her recent hospitalizations and conditioning.  She went to her pain specialist today for her chronic pain and told them about her ongoing symptoms for which they advised that she come to the hospital for further evaluation.  Independent Historian contributed to assessment above: Her family members at bedside to corroborate information and past medical history as above.  External Medical Documents Reviewed: Cardiology note from 02/14/2023 noting recent hospitalization for shortness of breath with minimal exertion in October 2024, hypoxemia, with a negative CT angiogram for PE, treated with steroids, bronchodilators, Mucinex and Lasix, diagnosed with atrial fibrillation at that time and discharged on Eliquis.  At that cardiology appointment noted generalized weakness and fatigue, with a low blood sugar improved with p.o. intake, noted to have recent history of epistaxis requiring packing so decision was to hold Eliquis.  An internal medicine office visit from 02/22/2023 documents past medical history as well and at that time also notes generalized weakness and fatigue ongoing, also feeling tense/anxious, with chest feeling "tight ... Most of the time "       Physical Exam   Triage Vital Signs: ED Triage Vitals [02/25/23 1048]  Encounter Vitals Group     BP (!) 129/95     Systolic BP Percentile      Diastolic BP Percentile      Pulse Rate 81     Resp 19     Temp 98 F (36.7 C)     Temp src      SpO2 90 %     Weight      Height      Head Circumference      Peak Flow      Pain Score 0     Pain Loc      Pain Education      Exclude from Growth Chart     Most recent vital signs: Vitals:   02/25/23 1400 02/25/23 1449  BP: 130/81 119/78  Pulse: (!) 114 (!) 124  Resp: (!) 26 20  Temp:    SpO2: 94% 91%    General: Awake, no distress.  CV:  Good peripheral perfusion.  Resp:  Normal effort.  Abd:  No distention.  Other:  She is tachycardic, irregular rate ranging anywhere from 100-130s, normotensive.  Oxygen saturation is in the low 90s.  She is tachypneic.  Her lung sounds are clear without obvious focalities rales or wheezing.  She does not have any significant peripheral edema.   ED Results / Procedures / Treatments   Labs (all labs ordered are listed, but only abnormal results are displayed) Labs Reviewed  BASIC METABOLIC PANEL - Abnormal; Notable for the following components:      Result Value   BUN 28 (*)    GFR, Estimated  56 (*)    All other components within normal limits  CBC - Abnormal; Notable for the following components:   WBC 11.1 (*)    All other components within normal limits  BRAIN NATRIURETIC PEPTIDE - Abnormal; Notable for the following components:   B Natriuretic Peptide 512.7 (*)    All other components within normal limits  URINALYSIS, W/ REFLEX TO CULTURE (INFECTION SUSPECTED) - Abnormal; Notable for the following components:   Color, Urine YELLOW (*)    APPearance CLEAR (*)    Glucose, UA >=500 (*)    Leukocytes,Ua TRACE (*)    Non Squamous Epithelial PRESENT (*)    All other components within normal limits  MAGNESIUM  TROPONIN I (HIGH SENSITIVITY)  TROPONIN I (HIGH SENSITIVITY)      I ordered and reviewed the above labs they are notable for white blood cell count slightly elevated 11.1, H&H is normal.  EKG  ED ECG REPORT I, Pilar Jarvis, the attending physician, personally viewed and interpreted this ECG.   Date: 02/25/2023  EKG Time: 1046  Rate: 116  Rhythm: AF RVR  Axis: nl  Intervals:none  ST&T Change: no stemi    RADIOLOGY I independently reviewed and interpreted CT angiogram of the chest and see no obvious large saddle clot I also reviewed radiologist's formal read.   PROCEDURES:  Critical Care performed: Yes, see critical care procedure note(s)  .Critical Care  Performed by: Pilar Jarvis, MD Authorized by: Pilar Jarvis, MD   Critical care provider statement:    Critical care time (minutes):  30   Critical care was time spent personally by me on the following activities:  Development of treatment plan with patient or surrogate, discussions with consultants, evaluation of patient's response to treatment, examination of patient, ordering and review of laboratory studies, ordering and review of radiographic studies, ordering and performing treatments and interventions, pulse oximetry, re-evaluation of patient's condition and review of old charts    MEDICATIONS ORDERED IN ED: Medications  iohexol (OMNIPAQUE) 350 MG/ML injection 75 mL (75 mLs Intravenous Contrast Given 02/25/23 1210)  diltiazem (CARDIZEM) tablet 60 mg (60 mg Oral Given 02/25/23 1340)  diltiazem (CARDIZEM) injection 10 mg (10 mg Intravenous Given 02/25/23 1449)  furosemide (LASIX) injection 40 mg (40 mg Intravenous Given 02/25/23 1453)    External physician / consultants:  I spoke with hospitalist for admission and regarding care plan for this patient.   IMPRESSION / MDM / ASSESSMENT AND PLAN / ED COURSE  I reviewed the triage vital signs and the nursing notes.                                Patient's presentation is most consistent with acute presentation with potential  threat to life or bodily function.  Differential diagnosis includes, but is not limited to, atrial fibrillation with rapid ventricular response, PE, ACS, deconditioning, electrolyte derangements, urinary tract infection   The patient is on the cardiac monitor to evaluate for evidence of arrhythmia and/or significant heart rate changes.  MDM:     Chief complaint is ongoing generalized weakness and fatigue unchanged for several weeks after her recent hospitalization.  She does have ongoing chest pain and dyspnea on exertion though not much changed acutely, I did consider ACS or PE in the setting of these complaints but fortunately the EKG is nonischemic, will check troponins.  I considered PE given her nonanticoagulated state and will check a CT angiogram.  Considered heart failure but no evidence of fluid overload on clinical exam, check proBNP, check chest x-ray and chest imaging as above.  Check urinalysis for infection.  She is stable with her atrial fibrillation and mild rapid ventricular response -will give her an oral dose of her diltiazem, which she did not take this morning, and await the results of workup above for further management/dispo consideration.   --  CT angiogram is negative for pulmonary embolism.  CXR and CT shows signs of pulmonary edema, also with elevated BNP- will give IV Lasix, IV rate control.  After 10 mg of IV Lasix, heart rate now in the 90s to 100s, I anticipate that the p.o. dose will take effect and so we will hold on further IV treatments at this time.  Can give another dose or start infusion if she gets to be  tachycardic again.  Admission.      FINAL CLINICAL IMPRESSION(S) / ED DIAGNOSES   Final diagnoses:  Generalized weakness  Other fatigue  Exertional dyspnea  Atrial fibrillation with rapid ventricular response (HCC)  Nonspecific chest pain  Acute pulmonary edema (HCC)     Rx / DC Orders   ED Discharge Orders     None        Note:  This  document was prepared using Dragon voice recognition software and may include unintentional dictation errors.    Pilar Jarvis, MD 02/25/23 1501

## 2023-02-25 NOTE — Assessment & Plan Note (Addendum)
Patient presenting with ongoing weakness, fatigue, DOB with acute worsening over the last 1-2 days.  Telemetry with atrial fibrillation with RVR.  Patient stopped anticoagulation after developing epistaxis requiring packing.  - Telemetry monitoring - S/p diltiazem 60 mg oral followed by diltiazem 10 mg IV - Will plan to start a diltiazem infusion if no improvement in rates - Continue home metoprolol 25 mg twice daily - Hold off on restarting full anticoagulation pending further discussions with patient

## 2023-02-25 NOTE — Assessment & Plan Note (Signed)
Likely triggered by atrial fibrillation with RVR.  BNP is elevated at 512, higher than previous admission.  - Telemetry monitoring - Continue Lasix 40 mg IV daily - Strict in and out - Daily weights

## 2023-02-25 NOTE — Assessment & Plan Note (Signed)
Minimal hypoxia as low as 88% requiring 2 L, likely due to mild pulmonary edema.  CTA did not demonstrate any PE, but did note possible signs of aspiration.  This was evaluated on her last hospitalization with SLP noting low aspiration risk.  - Continue supplemental oxygen to maintain oxygen saturation above 88% - Wean as tolerated

## 2023-02-25 NOTE — Assessment & Plan Note (Addendum)
-   Hold home insulin products - Continue home Farxiga - Semglee 18 units at bedtime - SSI, moderate

## 2023-02-25 NOTE — ED Notes (Signed)
Receiving RN Toni Amend made aware (by her secretary on 2A) that handoff is in chart and pt is on her way up, she will accept TOC once pt has arrived to inpatient unit, all questions and concerns address. Pt being taken up by RN Dorian with cardiac monitoring.

## 2023-02-25 NOTE — H&P (Addendum)
History and Physical    Patient: Samantha Freeman VQQ:[id number] DOB: 13-Dec-1935 DOA: 02/25/2023 DOS: the patient was seen and examined on 02/25/2023 PCP: Marguarite Arbour, MD  Patient coming from: Home  Chief Complaint:  Chief Complaint  Patient presents with   Atrial Fibrillation   HPI: Samantha Freeman is a 87 y.o. female with medical history significant of recently diagnosed atrial fibrillation not on [facility], recently diagnosed CHF, type 2 diabetes, hypertension, hyperlipidemia, CKD stage IIIa, depression/anxiety, who presents to the ED due to atrial fibrillation.  Mrs. Scoggin states she has been experiencing generalized weakness, fatigue, DOE and orthopnea for the last few weeks, but her symptoms significantly worsened over the last 1-2 days.  She notes she occasionally experiences shortness of breath at rest but this comes and goes; when she experiences shortness of breath, it is sudden onset.  She endorses some chest rightness when shortness of breath starts but denies any at this time.  She denies any palpitations, nausea, vomiting, fever, chills.  She notes a poor appetite but denies any abdominal distention, weight loss or acute weight gain.  ED course: On arrival to the ED, patient was normotensive at 130/104 with heart rate 113.  She was saturating at 92% on room air but subsequently desaturated to 88% and required 2 L of supplemental oxygen with improvement to 91%.  She was afebrile 98.6.Initial workup demonstrates WBC of 11.1, BUN of 28, creatinine 0.99 with GFR 56.  BNP elevated at 512.  Urinalysis with trace leukocytes, glucosuria but no WBC or bacteria.  CTA was obtained that demonstrated no evidence of PE, however changes favoring congestive heart failure/pulmonary edema.  Patient started on diltiazem, Lasix and TRH contacted for admission.  Review of Systems: As mentioned in the history of present illness. All other systems reviewed and are negative.  Past Medical History:   Diagnosis Date   Anemia    Anxiety    Aortic stenosis, mild    Arthritis    CKD (chronic kidney disease), stage III (HCC)    Complication of anesthesia    Propofol anaphylaxis   Cortical cataract    DDD (degenerative disc disease), lumbar    Depression    Dyspnea    GERD (gastroesophageal reflux disease)    Headache    Heart murmur    History of 2019 novel coronavirus disease (COVID-19) 12/01/2018   HLD (hyperlipidemia)    Hypertension    Pneumonia    Schatzki's ring    Seasonal allergies    T2DM (type 2 diabetes mellitus) (HCC)    Valvular insufficiency    Past Surgical History:  Procedure Laterality Date   ABDOMINAL HYSTERECTOMY     APPENDECTOMY     BICEPT TENODESIS Right 05/13/2018   Procedure: BICEPS TENODESIS;  Surgeon: Christena Flake, MD;  Location: [facility] ORS;  Service: Orthopedics;  Laterality: Right;   CARDIAC CATHETERIZATION     COLONOSCOPY     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   EYE SURGERY Right 09/2013   Corneal transplant   JOINT REPLACEMENT Right 2015   knee   REVERSE SHOULDER ARTHROPLASTY Left 10/13/2020   Procedure: REVERSE SHOULDER ARTHROPLASTY;  Surgeon: Christena Flake, MD;  Location: [facility] ORS;  Service: Orthopedics;  Laterality: Left;   SHOULDER ARTHROSCOPY WITH ROTATOR CUFF REPAIR Right 05/13/2018   Procedure: SHOULDER ARTHROSCOPY WITH DEBRIDEMENT, DECOMPRESSION AND ROTATOR CUFF REPAIR;  Surgeon: Christena Flake, MD;  Location: [facility] ORS;  Service: Orthopedics;  Laterality: Right;   TONSILLECTOMY  Social History:  reports that she has never smoked. She has never used smokeless tobacco. She reports that she does not drink alcohol and does not use drugs.  Allergies  Allergen Reactions   Buspirone     Other Reaction(s): Dizziness   Propofol Anaphylaxis   Zolpidem Other (See Comments)    Hallucinations   Cholestyramine Itching and Rash   Codeine Nausea Only   Hydrochlorothiazide Other (See Comments)    PATIENT DOES NOT REMEMBER    Loratadine Other (See Comments)    PATIENT DOES NOT REMEMBER   Niacin Rash    Family History  Problem Relation Age of Onset   Breast cancer Paternal Aunt     Prior to Admission medications   Medication Sig Start Date End Date Taking? Authorizing Provider  LANTUS SOLOSTAR 100 UNIT/ML Solostar Pen Inject 36 Units into the skin at bedtime. 02/22/23  Yes [provider]  aspirin EC 81 MG tablet Take 81 mg by mouth every other day.    [provider]  blood glucose meter kit and supplies KIT Dispense based on patient and insurance preference. Use up to four times daily as directed. (FOR ICD-9 250.00, 250.01). For QAC - HS accuchecks. 12/11/18   Leroy Sea, MD  Calcium Carb-Cholecalciferol (CALCIUM 600/VITAMIN D3 PO) Take 1 tablet by mouth daily.    [provider]  Coenzyme Q10 10 MG capsule Take 10 mg by mouth every morning.    [provider]  dapagliflozin propanediol (FARXIGA) 10 MG TABS tablet Take 10 mg by mouth daily. 11/03/20   [provider]  diltiazem (CARDIZEM CD) 180 MG 24 hr capsule Take 1 capsule (180 mg total) by mouth daily. 02/05/23   Jonah Blue, MD  enalapril (VASOTEC) 20 MG tablet Take 20 mg by mouth 2 (two) times daily.    [provider]  fenofibrate (TRICOR) 48 MG tablet Take 1 tablet by mouth daily. 12/23/22   [provider]  ferrous sulfate 325 (65 FE) MG tablet Take 325 mg by mouth daily with breakfast.    [provider]  furosemide (LASIX) 20 MG tablet Take 20 mg by mouth daily as needed for fluid or edema. 05/22/22 12/31/23  [provider]  gabapentin (NEURONTIN) 100 MG capsule Take 200 mg by mouth 3 (three) times daily.    [provider]  glucose blood (FREESTYLE LITE) test strip For glucose testing every before meals at bedtime. Diagnosis E 11.65  Can substitute to any accepted brand 12/11/18   Leroy Sea, MD  hydrOXYzine (ATARAX) 25 MG tablet Take 1 tablet by  mouth 2 (two) times daily. 02/16/21   [provider]  insulin glargine (LANTUS) 100 UNIT/ML injection Inject 0.4 mLs (40 Units total) into the skin at bedtime. 02/04/23   Jonah Blue, MD  insulin lispro (HUMALOG) 100 UNIT/ML injection Inject 7-19 Units into the skin 3 (three) times daily before meals. Blood Glucose level: 140-199 - 7 units, 200-250 - 9 units, 251-299 - 13 units,  300-349 - 17 units,  350 or above 19 units.    [provider]  Insulin Syringe-Needle U-100 25G X 1" 1 ML MISC For 4 times a day insulin SQ, 1 month supply. Diagnosis E11.65 12/11/18   Leroy Sea, MD  magnesium oxide (MAG-OX) 400 MG tablet Take 800 mg by mouth 3 (three) times daily. 05/18/13   [provider]  metoprolol tartrate (LOPRESSOR) 25 MG tablet Take 1 tablet (25 mg total) by mouth 2 (two)  times daily. 02/04/23   Jonah Blue, MD  Multiple Vitamin (MULTIVITAMIN) tablet Take 1 tablet by mouth daily. Women's Daily Multivitamin    [provider]  pantoprazole (PROTONIX) 40 MG tablet Take 1 tablet (40 mg total) by mouth daily. 02/05/23   Jonah Blue, MD  prednisoLONE acetate (PRED FORTE) 1 % ophthalmic suspension Place 1 drop into both eyes at bedtime. 10/04/17   [provider]  rosuvastatin (CRESTOR) 10 MG tablet Take 10 mg by mouth daily.    [provider]  traZODone (DESYREL) 50 MG tablet Take 50 mg by mouth at bedtime.    [provider]  vitamin B-12 (CYANOCOBALAMIN) 1000 MCG tablet Take 1,000 mcg by mouth daily.    [provider]  apixaban (ELIQUIS) 5 MG TABS tablet Take 1 tablet (5 mg total) by mouth every 12 (twelve) hours. 02/01/23 02/22/23      Physical Exam: Vitals:   02/25/23 1715 02/25/23 1730 02/25/23 1800 02/25/23 1830  BP:   (!) 134/99 (!) 126/97  Pulse: (!) 123 (!) 122 (!) 114 (!) 128  Resp: (!) 28 (!) 25 (!) 23 (!) 26  Temp:  98.4 F (36.9 C)    TempSrc:  Axillary    SpO2: 91% 90% 93% 94%  Weight:        Physical Exam Vitals and nursing note reviewed.  Constitutional:      General: She is not in acute distress.    Appearance: She is obese. She is not toxic-appearing.  HENT:     Head: Normocephalic and atraumatic.     Mouth/Throat:     Mouth: Mucous membranes are moist.     Pharynx: Oropharynx is clear.  Eyes:     Conjunctiva/sclera: Conjunctivae normal.     Pupils: Pupils are equal, round, and reactive to light.  Cardiovascular:     Rate and Rhythm: Tachycardia present. Rhythm irregular.     Heart sounds: No murmur heard. Pulmonary:     Effort: Pulmonary effort is normal. No respiratory distress.     Breath sounds: Rales (Bibasilar rales) present.  Abdominal:     General: Bowel sounds are normal. There is no distension.     Palpations: Abdomen is soft.     Tenderness: There is no abdominal tenderness. There is no guarding.  Musculoskeletal:     Right lower leg: No edema.     Left lower leg: No edema.  Skin:    General: Skin is warm and dry.  Neurological:     Mental Status: She is alert and oriented to person, place, and time. Mental status is at baseline.  Psychiatric:        Mood and Affect: Mood normal.        Behavior: Behavior normal.    Data Reviewed: CBC with WBC of 11.1, hemoglobin of 14.3, MCV of 92.5 and platelets of 281 BMP with sodium of 138, potassium 3.8, bicarb 26, BUN 28, creatinine 0.99 with GFR 56 Magnesium 2.1 Troponin negative at 707 BNP 512 Urinalysis with glucosuria, leukocytes, but no bacteria, WBC or RBC.  EKG personally reviewed.  Irregular narrow complex rhythm consistent with atrial fibrillation with RVR with rates of 116.  CT Angio Chest PE W/Cm &/Or Wo Cm  Result Date: 02/25/2023 CLINICAL DATA:  Pulmonary embolism (PE) suspected, high prob. Weakness. Shortness of breath. EXAM: CT ANGIOGRAPHY CHEST WITH CONTRAST TECHNIQUE: Multidetector CT imaging of the chest was performed using the standard protocol during bolus administration of  intravenous contrast. Multiplanar CT image reconstructions  and MIPs were obtained to evaluate the vascular anatomy. RADIATION DOSE REDUCTION: This exam was performed according to the departmental dose-optimization program which includes automated exposure control, adjustment of the mA and/or kV according to patient size and/or use of iterative reconstruction technique. CONTRAST:  75mL OMNIPAQUE IOHEXOL 350 MG/ML SOLN COMPARISON:  CT angiography chest from 01/30/2023. FINDINGS: Cardiovascular: No evidence of embolism to the proximal subsegmental pulmonary artery level. Mild cardiomegaly. No pericardial effusion. No aortic aneurysm. There are coronary artery calcifications, in keeping with coronary artery disease. There are also mild-to-moderate peripheral atherosclerotic vascular calcifications of thoracic aorta and its major branches. There is dilation of the main pulmonary trunk measuring up to 3.3 cm, which is nonspecific but can be seen with pulmonary artery hypertension. Mediastinum/Nodes: Visualized thyroid gland appears grossly unremarkable. No solid / cystic mediastinal masses. The esophagus is nondistended precluding optimal assessment. There are few mildly prominent mediastinal and hilar lymph nodes, which do not meet the size criteria for lymphadenopathy and appear grossly similar to the prior study, favoring benign etiology. No axillary lymphadenopathy by size criteria. Lungs/Pleura: The central tracheo-bronchial tree is patent. There is mild, smooth, circumferential thickening of the segmental and subsegmental bronchial walls, throughout bilateral lungs, which is nonspecific. Findings are most commonly seen with pulmonary edema, bronchitis or reactive airway disease, such as asthma. Redemonstration of filling defects in the bilateral lower lobe subsegmental bronchi, likely due to mucus/secretions or aspiration. There are patchy areas of smooth interlobular septal thickening throughout bilateral lungs and  bilateral trace pleural effusions. There are patchy areas of linear, plate-like atelectasis and/or scarring throughout bilateral lungs. No mass or consolidation. No pneumothorax. No suspicious lung nodules. Upper Abdomen: Visualized upper abdominal viscera within normal limits. Musculoskeletal: The visualized soft tissues of the chest wall are grossly unremarkable. No suspicious osseous lesions. There are mild multilevel degenerative changes in the visualized spine. Review of the MIP images confirms the above findings. IMPRESSION: *No evidence of pulmonary embolism to the proximal subsegmental pulmonary artery level. *There is mild smooth interlobular septal thickening throughout bilateral lungs with bilateral trace pleural effusions favoring congestive heart failure/pulmonary edema. *Multiple other nonacute observations, as described above. Electronically Signed   By: Jules Schick M.D.   On: 02/25/2023 14:34   DG Chest Port 1 View  Result Date: 02/25/2023 CLINICAL DATA:  Shortness of breath. EXAM: PORTABLE CHEST 1 VIEW COMPARISON:  Sep 15, 2019. FINDINGS: Mild cardiomegaly is noted. Mild central pulmonary vascular congestion is noted. Minimal bibasilar pulmonary edema is noted. Status post left shoulder arthroplasty. IMPRESSION: Mild cardiomegaly with mild central pulmonary vascular congestion and minimal bibasilar pulmonary edema. Electronically Signed   By: Lupita Raider M.D.   On: 02/25/2023 13:22    There are no new results to review at this time.  Assessment and Plan:  * Atrial fibrillation with RVR (HCC) Patient presenting with ongoing weakness, fatigue, DOB with acute worsening over the last 1-2 days.  Telemetry with atrial fibrillation with RVR.  Patient stopped anticoagulation after developing epistaxis requiring packing.  - Telemetry monitoring - S/p diltiazem 60 mg oral followed by diltiazem 10 mg IV - Will plan to start a diltiazem infusion if no improvement in rates - Continue home  metoprolol 25 mg twice daily - Hold off on restarting full anticoagulation pending further discussions with patient  Acute hypoxic respiratory failure (HCC) Minimal hypoxia as low as 88% requiring 2 L, likely due to mild pulmonary edema.  CTA did not demonstrate any PE, but did note possible signs  of aspiration.  This was evaluated on her last hospitalization with SLP noting low aspiration risk.  - Continue supplemental oxygen to maintain oxygen saturation above 88% - Wean as tolerated  Acute heart failure with preserved ejection fraction (HFpEF) (HCC) Likely triggered by atrial fibrillation with RVR.  BNP is elevated at 512, higher than previous admission.  - Telemetry monitoring - Continue Lasix 40 mg IV daily - Strict in and out - Daily weights  Generalized weakness Likely due to multiple new comorbidities including atrial fibrillation with RVR and physical deconditioning.  - PT/OT  Anxiety and depression - Continue home Xanax and Atarax  HTN (hypertension) - Hold home amlodipine given currently on diltiazem - Continue home enalapril  DM (diabetes mellitus) (HCC) - Hold home insulin products - Continue home Farxiga - Semglee 18 units at bedtime - SSI, moderate  Chronic kidney disease, stage 3a (HCC) Renal function is currently improved compared to previous hospitalizations.  - Daily BMP  Advance Care Planning:   Code Status: Limited: Do not attempt resuscitation (DNR) -DNR-LIMITED -Do Not Intubate/DNI  Verified by patient  Consults: None  Family Communication: Patient's friend updated at bedside.   Severity of Illness: The appropriate patient status for this patient is INPATIENT. Inpatient status is judged to be reasonable and necessary in order to provide the required intensity of service to ensure the patient's safety. The patient's presenting symptoms, physical exam findings, and initial radiographic and laboratory data in the context of their chronic  comorbidities is felt to place them at high risk for further clinical deterioration. Furthermore, it is not anticipated that the patient will be medically stable for discharge from the hospital within 2 midnights of admission.   * I certify that at the point of admission it is my clinical judgment that the patient will require inpatient hospital care spanning beyond 2 midnights from the point of admission due to high intensity of service, high risk for further deterioration and high frequency of surveillance required.*  Author: Verdene Lennert, MD 02/25/2023 8:53 PM  For on call review [email or web address].

## 2023-02-25 NOTE — Assessment & Plan Note (Signed)
Renal function is currently improved compared to previous hospitalizations.  - Daily BMP

## 2023-02-25 NOTE — ED Triage Notes (Signed)
Pt comes with c/o afib. Pt stats increased weakness and no energy. Pt stats sob. Pt states new hx of afib. Pt not on thinner.

## 2023-02-25 NOTE — ED Notes (Signed)
ED TO INPATIENT HANDOFF REPORT  ED Nurse Name and Phone #: [phone] [facility]  S Name/Age/Gender Samantha Freeman 87 y.o. female Room/Bed: ED19A/ED19A  Code Status   Code Status: Limited: Do not attempt resuscitation (DNR) -DNR-LIMITED -Do Not Intubate/DNI   Home/SNF/Other Home Patient oriented to: self, place, time, and situation Is this baseline? Yes   Triage Complete: Triage complete  Chief Complaint Atrial fibrillation with RVR (HCC) [I48.91]  Triage Note Pt comes with c/o afib. Pt stats increased weakness and no energy. Pt stats sob. Pt states new hx of afib. Pt not on thinner.    Allergies Allergies  Allergen Reactions   Buspirone     Other Reaction(s): Dizziness   Propofol Anaphylaxis   Zolpidem Other (See Comments)    Hallucinations   Cholestyramine Itching and Rash   Codeine Nausea Only   Hydrochlorothiazide Other (See Comments)    PATIENT DOES NOT REMEMBER   Loratadine Other (See Comments)    PATIENT DOES NOT REMEMBER   Niacin Rash    Level of Care/Admitting Diagnosis ED Disposition     ED Disposition  Admit   Condition  --   Comment  Hospital Area: [facility] [[id number]]  Level of Care: Telemetry Cardiac [[id number]]  Covid Evaluation: Asymptomatic - no recent exposure (last 10 days) testing not required  Diagnosis: Atrial fibrillation with RVR [facility]) [[id number]]  Admitting Physician: Verdene Lennert [[id number]]  Attending Physician: Verdene Lennert [phone]  Certification:: I certify this patient will need inpatient services for at least 2 midnights  Expected Medical Readiness: 03/01/2023          B Medical/Surgery History Past Medical History:  Diagnosis Date   Anemia    Anxiety    Aortic stenosis, mild    Arthritis    CKD (chronic kidney disease), stage III (HCC)    Complication of anesthesia    Propofol anaphylaxis   Cortical cataract    DDD (degenerative disc disease), lumbar    Depression    Dyspnea    GERD  (gastroesophageal reflux disease)    Headache    Heart murmur    History of 2019 novel coronavirus disease (COVID-19) 12/01/2018   HLD (hyperlipidemia)    Hypertension    Pneumonia    Schatzki's ring    Seasonal allergies    T2DM (type 2 diabetes mellitus) (HCC)    Valvular insufficiency    Past Surgical History:  Procedure Laterality Date   ABDOMINAL HYSTERECTOMY     APPENDECTOMY     BICEPT TENODESIS Right 05/13/2018   Procedure: BICEPS TENODESIS;  Surgeon: Christena Flake, MD;  Location: [facility] ORS;  Service: Orthopedics;  Laterality: Right;   CARDIAC CATHETERIZATION     COLONOSCOPY     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   EYE SURGERY Right 09/2013   Corneal transplant   JOINT REPLACEMENT Right 2015   knee   REVERSE SHOULDER ARTHROPLASTY Left 10/13/2020   Procedure: REVERSE SHOULDER ARTHROPLASTY;  Surgeon: Christena Flake, MD;  Location: [facility] ORS;  Service: Orthopedics;  Laterality: Left;   SHOULDER ARTHROSCOPY WITH ROTATOR CUFF REPAIR Right 05/13/2018   Procedure: SHOULDER ARTHROSCOPY WITH DEBRIDEMENT, DECOMPRESSION AND ROTATOR CUFF REPAIR;  Surgeon: Christena Flake, MD;  Location: [facility] ORS;  Service: Orthopedics;  Laterality: Right;   TONSILLECTOMY       A IV Location/Drains/Wounds Patient Lines/Drains/Airways Status     Active Line/Drains/Airways     Name Placement date Placement time Site Days   Peripheral IV 02/25/23 20  G 1" Left Antecubital 02/25/23  1155  Antecubital  less than 1            Intake/Output Last 24 hours  Intake/Output Summary (Last 24 hours) at 02/25/2023 2109 Last data filed at 02/25/2023 2051 Gross per 24 hour  Intake --  Output 300 ml  Net -300 ml    Labs/Imaging Results for orders placed or performed during the hospital encounter of 02/25/23 (from the past 48 hour(s))  Basic metabolic panel     Status: Abnormal   Collection Time: 02/25/23 10:49 AM  Result Value Ref Range   Sodium 138 135 - 145 mmol/L   Potassium 3.8 3.5 - 5.1  mmol/L   Chloride 102 98 - 111 mmol/L   CO2 26 22 - 32 mmol/L   Glucose, Bld 86 70 - 99 mg/dL    Comment: Glucose reference range applies only to samples taken after fasting for at least 8 hours.   BUN 28 (H) 8 - 23 mg/dL   Creatinine, Ser 1.47 0.44 - 1.00 mg/dL   Calcium 9.4 8.9 - 82.9 mg/dL   GFR, Estimated 56 (L) >60 mL/min    Comment: (NOTE) Calculated using the CKD-EPI Creatinine Equation (2021)    Anion gap 10 5 - 15    Comment: Performed at [facility], [address] [place], [place] [address]  CBC     Status: Abnormal   Collection Time: 02/25/23 10:49 AM  Result Value Ref Range   WBC 11.1 (H) 4.0 - 10.5 K/uL   RBC 4.78 3.87 - 5.11 MIL/uL   Hemoglobin 14.3 12.0 - 15.0 g/dL   HCT 08.6 57.8 - 46.9 %   MCV 92.5 80.0 - 100.0 fL   MCH 29.9 26.0 - 34.0 pg   MCHC 32.4 30.0 - 36.0 g/dL   RDW 62.9 52.8 - 41.3 %   Platelets 281 150 - 400 K/uL   nRBC 0.0 0.0 - 0.2 %    Comment: Performed at [facility], [address] [place], [place] [address]  Brain natriuretic peptide     Status: Abnormal   Collection Time: 02/25/23 10:49 AM  Result Value Ref Range   B Natriuretic Peptide 512.7 (H) 0.0 - 100.0 pg/mL    Comment: Performed at [facility], [address] [place], [place] [address]  Troponin I (High Sensitivity)     Status: None   Collection Time: 02/25/23 10:50 AM  Result Value Ref Range   Troponin I (High Sensitivity) 7 <18 ng/L    Comment: (NOTE) Elevated high sensitivity troponin I (hsTnI) values and significant  changes across serial measurements may suggest ACS but many other  chronic and acute conditions are known to elevate hsTnI results.  Refer to the "Links" section for chest pain algorithms and additional  guidance. Performed at [facility], [address] [place], [place] [address]   Magnesium     Status: None   Collection Time: 02/25/23 10:50 AM  Result Value Ref Range   Magnesium 2.1 1.7 - 2.4 mg/dL     Comment: Performed at [facility], [address] [place], [place] [address]  Urinalysis, w/ Reflex to Culture (Infection Suspected) -Urine, Clean Catch     Status: Abnormal   Collection Time: 02/25/23 12:05 PM  Result Value Ref Range   Specimen Source URINE, CLEAN CATCH    Color, Urine YELLOW (A) YELLOW   APPearance CLEAR (A) CLEAR   Specific Gravity, Urine 1.020 1.005 - 1.030  pH 6.0 5.0 - 8.0   Glucose, UA >=500 (A) NEGATIVE mg/dL   Hgb urine dipstick NEGATIVE NEGATIVE   Bilirubin Urine NEGATIVE NEGATIVE   Ketones, ur NEGATIVE NEGATIVE mg/dL   Protein, ur NEGATIVE NEGATIVE mg/dL   Nitrite NEGATIVE NEGATIVE   Leukocytes,Ua TRACE (A) NEGATIVE   RBC / HPF 0-5 0 - 5 RBC/hpf   WBC, UA 0-5 0 - 5 WBC/hpf    Comment:        Reflex urine culture not performed if WBC <=10, OR if Squamous epithelial cells >5. If Squamous epithelial cells >5 suggest recollection.    Bacteria, UA NONE SEEN NONE SEEN   Squamous Epithelial / HPF 0-5 0 - 5 /HPF   Non Squamous Epithelial PRESENT (A) NONE SEEN    Comment: Performed at [facility], [address] [place], [place] [address]  Troponin I (High Sensitivity)     Status: None   Collection Time: 02/25/23  1:40 PM  Result Value Ref Range   Troponin I (High Sensitivity) 7 <18 ng/L    Comment: (NOTE) Elevated high sensitivity troponin I (hsTnI) values and significant  changes across serial measurements may suggest ACS but many other  chronic and acute conditions are known to elevate hsTnI results.  Refer to the "Links" section for chest pain algorithms and additional  guidance. Performed at [facility], [address] [place], [place] [address]   CBG monitoring, ED     Status: Abnormal   Collection Time: 02/25/23  4:51 PM  Result Value Ref Range   Glucose-Capillary 101 (H) 70 - 99 mg/dL    Comment: Glucose reference range applies only to samples taken after fasting for at least 8 hours.   CT Angio Chest PE  W/Cm &/Or Wo Cm  Result Date: 02/25/2023 CLINICAL DATA:  Pulmonary embolism (PE) suspected, high prob. Weakness. Shortness of breath. EXAM: CT ANGIOGRAPHY CHEST WITH CONTRAST TECHNIQUE: Multidetector CT imaging of the chest was performed using the standard protocol during bolus administration of intravenous contrast. Multiplanar CT image reconstructions and MIPs were obtained to evaluate the vascular anatomy. RADIATION DOSE REDUCTION: This exam was performed according to the departmental dose-optimization program which includes automated exposure control, adjustment of the mA and/or kV according to patient size and/or use of iterative reconstruction technique. CONTRAST:  75mL OMNIPAQUE IOHEXOL 350 MG/ML SOLN COMPARISON:  CT angiography chest from 01/30/2023. FINDINGS: Cardiovascular: No evidence of embolism to the proximal subsegmental pulmonary artery level. Mild cardiomegaly. No pericardial effusion. No aortic aneurysm. There are coronary artery calcifications, in keeping with coronary artery disease. There are also mild-to-moderate peripheral atherosclerotic vascular calcifications of thoracic aorta and its major branches. There is dilation of the main pulmonary trunk measuring up to 3.3 cm, which is nonspecific but can be seen with pulmonary artery hypertension. Mediastinum/Nodes: Visualized thyroid gland appears grossly unremarkable. No solid / cystic mediastinal masses. The esophagus is nondistended precluding optimal assessment. There are few mildly prominent mediastinal and hilar lymph nodes, which do not meet the size criteria for lymphadenopathy and appear grossly similar to the prior study, favoring benign etiology. No axillary lymphadenopathy by size criteria. Lungs/Pleura: The central tracheo-bronchial tree is patent. There is mild, smooth, circumferential thickening of the segmental and subsegmental bronchial walls, throughout bilateral lungs, which is nonspecific. Findings are most commonly seen  with pulmonary edema, bronchitis or reactive airway disease, such as asthma. Redemonstration of filling defects in the bilateral lower lobe subsegmental bronchi, likely due to mucus/secretions or aspiration. There are patchy areas  of smooth interlobular septal thickening throughout bilateral lungs and bilateral trace pleural effusions. There are patchy areas of linear, plate-like atelectasis and/or scarring throughout bilateral lungs. No mass or consolidation. No pneumothorax. No suspicious lung nodules. Upper Abdomen: Visualized upper abdominal viscera within normal limits. Musculoskeletal: The visualized soft tissues of the chest wall are grossly unremarkable. No suspicious osseous lesions. There are mild multilevel degenerative changes in the visualized spine. Review of the MIP images confirms the above findings. IMPRESSION: *No evidence of pulmonary embolism to the proximal subsegmental pulmonary artery level. *There is mild smooth interlobular septal thickening throughout bilateral lungs with bilateral trace pleural effusions favoring congestive heart failure/pulmonary edema. *Multiple other nonacute observations, as described above. Electronically Signed   By: Jules Schick M.D.   On: 02/25/2023 14:34   DG Chest Port 1 View  Result Date: 02/25/2023 CLINICAL DATA:  Shortness of breath. EXAM: PORTABLE CHEST 1 VIEW COMPARISON:  Sep 15, 2019. FINDINGS: Mild cardiomegaly is noted. Mild central pulmonary vascular congestion is noted. Minimal bibasilar pulmonary edema is noted. Status post left shoulder arthroplasty. IMPRESSION: Mild cardiomegaly with mild central pulmonary vascular congestion and minimal bibasilar pulmonary edema. Electronically Signed   By: Lupita Raider M.D.   On: 02/25/2023 13:22    Pending Labs Unresulted Labs (From admission, onward)     Start     Ordered   02/26/23 0500  CBC  Tomorrow morning,   R        02/25/23 1615   02/26/23 0500  Basic metabolic panel  Tomorrow morning,   R         02/25/23 1615   02/26/23 0500  Magnesium  Tomorrow morning,   R        02/25/23 1637            Vitals/Pain Today's Vitals   02/25/23 1900 02/25/23 1930 02/25/23 2000 02/25/23 2030  BP: 128/83 121/84 130/87 (!) 119/92  Pulse: 81 (!) 119 (!) 123 (!) 122  Resp: (!) 24 (!) 25 (!) 25 (!) 29  Temp:      TempSrc:      SpO2: 92% 92% 93% 91%  Weight:      PainSc:        Isolation Precautions No active isolations  Medications Medications  acetaminophen (TYLENOL) tablet 650 mg (has no administration in time range)  ondansetron (ZOFRAN) injection 4 mg (has no administration in time range)  enoxaparin (LOVENOX) injection 40 mg (has no administration in time range)  diltiazem (CARDIZEM) 125 mg in dextrose 5% 125 mL (1 mg/mL) infusion (12.5 mg/hr Intravenous Rate/Dose Change 02/25/23 1815)  furosemide (LASIX) injection 40 mg (has no administration in time range)  insulin aspart (novoLOG) injection 0-15 Units (0 Units Subcutaneous Hold 02/25/23 1654)  insulin glargine-yfgn (SEMGLEE) injection 18 Units (has no administration in time range)  ALPRAZolam (XANAX) tablet 0.5 mg (has no administration in time range)  dapagliflozin propanediol (FARXIGA) tablet 10 mg (has no administration in time range)  enalapril (VASOTEC) tablet 20 mg (has no administration in time range)  fenofibrate tablet 54 mg (has no administration in time range)  gabapentin (NEURONTIN) capsule 200 mg (has no administration in time range)  hydrOXYzine (ATARAX) tablet 25 mg (has no administration in time range)  magnesium oxide (MAG-OX) tablet 800 mg (has no administration in time range)  metoprolol tartrate (LOPRESSOR) tablet 25 mg (has no administration in time range)  pantoprazole (PROTONIX) EC tablet 40 mg (has no administration in time range)  prednisoLONE acetate (PRED FORTE)  1 % ophthalmic suspension 1 drop (has no administration in time range)  rosuvastatin (CRESTOR) tablet 10 mg (has no administration in  time range)  traZODone (DESYREL) tablet 50 mg (has no administration in time range)  cyanocobalamin (VITAMIN B12) tablet 1,000 mcg (has no administration in time range)  iohexol (OMNIPAQUE) 350 MG/ML injection 75 mL (75 mLs Intravenous Contrast Given 02/25/23 1210)  diltiazem (CARDIZEM) tablet 60 mg (60 mg Oral Given 02/25/23 1340)  diltiazem (CARDIZEM) injection 10 mg (10 mg Intravenous Given 02/25/23 1449)  furosemide (LASIX) injection 40 mg (40 mg Intravenous Given 02/25/23 1453)    Mobility walks with device     Focused Assessments Cardiac Assessment Handoff:  Cardiac Rhythm: Atrial fibrillation No results found for: "CKTOTAL", "CKMB", "CKMBINDEX", "TROPONINI" Lab Results  Component Value Date   DDIMER 0.78 (H) 12/11/2018   Does the Patient currently have chest pain? No    R Recommendations: See Admitting Provider Note  Report given to:   Additional Notes: A&O x4, pt has one IV, refused 2nd IV with previous RN and this RN, pt is on lasix, has used the bedpan, did try purwick, pt pt refused after about 30 mins of trial. No family present, pt is on cardizem drip at 12.62ml, HR 77-11 Afib. Denies CP at current. PT was placed on 2L Bier, d/t decrease in sats when ambulating per previous [employer]. Otherwise VSS, pt is pleasant.

## 2023-02-25 NOTE — Assessment & Plan Note (Signed)
Likely due to multiple new comorbidities including atrial fibrillation with RVR and physical deconditioning.  - PT/OT

## 2023-02-25 NOTE — Assessment & Plan Note (Signed)
-   Continue home Xanax and Atarax

## 2023-02-26 DIAGNOSIS — I4891 Unspecified atrial fibrillation: Secondary | ICD-10-CM | POA: Diagnosis not present

## 2023-02-26 LAB — CBC
HCT: 39.7 % (ref 36.0–46.0)
Hemoglobin: 13.1 g/dL (ref 12.0–15.0)
MCH: 29.8 pg (ref 26.0–34.0)
MCHC: 33 g/dL (ref 30.0–36.0)
MCV: 90.2 fL (ref 80.0–100.0)
Platelets: 247 10*3/uL (ref 150–400)
RBC: 4.4 MIL/uL (ref 3.87–5.11)
RDW: 15.4 % (ref 11.5–15.5)
WBC: 8.3 10*3/uL (ref 4.0–10.5)
nRBC: 0 % (ref 0.0–0.2)

## 2023-02-26 LAB — GLUCOSE, CAPILLARY
Glucose-Capillary: 132 mg/dL — ABNORMAL HIGH (ref 70–99)
Glucose-Capillary: 183 mg/dL — ABNORMAL HIGH (ref 70–99)
Glucose-Capillary: 237 mg/dL — ABNORMAL HIGH (ref 70–99)
Glucose-Capillary: 210 mg/dL — ABNORMAL HIGH (ref 70–99)

## 2023-02-26 LAB — BASIC METABOLIC PANEL
Anion gap: 11 (ref 5–15)
BUN: 29 mg/dL — ABNORMAL HIGH (ref 8–23)
CO2: 27 mmol/L (ref 22–32)
Calcium: 8.9 mg/dL (ref 8.9–10.3)
Chloride: 98 mmol/L (ref 98–111)
Creatinine, Ser: 1.09 mg/dL — ABNORMAL HIGH (ref 0.44–1.00)
GFR, Estimated: 50 mL/min — ABNORMAL LOW (ref >60)
Glucose, Bld: 150 mg/dL — ABNORMAL HIGH (ref 70–99)
Potassium: 3.5 mmol/L (ref 3.5–5.1)
Sodium: 136 mmol/L (ref 135–145)

## 2023-02-26 LAB — MAGNESIUM: Magnesium: 2 mg/dL (ref 1.7–2.4)

## 2023-02-26 MED ORDER — ADULT MULTIVITAMIN W/MINERALS CH
1.0000 | ORAL_TABLET | Freq: Every day | ORAL | Status: DC
  Administered 2023-02-26 – 2023-03-12 (×15): 1 via ORAL
  Filled 2023-02-26 (×16): qty 1

## 2023-02-26 MED ORDER — DILTIAZEM HCL 30 MG PO TABS
30.0000 mg | ORAL_TABLET | Freq: Four times a day (QID) | ORAL | Status: DC
  Administered 2023-02-26 – 2023-02-28 (×8): 30 mg via ORAL
  Filled 2023-02-26 (×8): qty 1

## 2023-02-26 MED ORDER — ENSURE ENLIVE PO LIQD
237.0000 mL | Freq: Three times a day (TID) | ORAL | Status: DC
  Administered 2023-02-26 – 2023-03-04 (×13): 237 mL via ORAL

## 2023-02-26 NOTE — Progress Notes (Signed)
PROGRESS NOTE    Samantha Freeman  LKG:[id number] DOB: [birth year] DOA: 02/25/2023 PCP: Marguarite Arbour, MD    Brief Narrative:  87 y.o. female with medical history significant of recently diagnosed atrial fibrillation not on [facility], recently diagnosed CHF, type 2 diabetes, hypertension, hyperlipidemia, CKD stage IIIa, depression/anxiety, who presents to the ED due to atrial fibrillation.   Samantha Freeman states she has been experiencing generalized weakness, fatigue, DOE and orthopnea for the last few weeks, but her symptoms significantly worsened over the last 1-2 days.  She notes she occasionally experiences shortness of breath at rest but this comes and goes; when she experiences shortness of breath, it is sudden onset.  She endorses some chest rightness when shortness of breath starts but denies any at this time.  She denies any palpitations, nausea, vomiting, fever, chills.  She notes a poor appetite but denies any abdominal distention, weight loss or acute weight gain.   Assessment & Plan:   Principal Problem:   Atrial fibrillation with RVR (HCC) Active Problems:   Acute hypoxic respiratory failure (HCC)   Acute heart failure with preserved ejection fraction (HFpEF) (HCC)   Chronic kidney disease, stage 3a (HCC)   DM (diabetes mellitus) (HCC)   HTN (hypertension)   Anxiety and depression   Generalized weakness  Atrial fibrillation with RVR (HCC) Patient presenting with ongoing weakness, fatigue, DOB with acute worsening over the last 1-2 days.  Telemetry with atrial fibrillation with RVR.  Patient stopped anticoagulation after developing epistaxis requiring packing. Plan: Start oral diltiazem 30 mg every 6 hours Wean diltiazem infusion as tolerated Continue home metoprolol 25 mg twice daily Hold anticoagulation now.  Will need further discussion regarding risk versus benefit   Acute hypoxic respiratory failure (HCC) Patient has mild pulmonary edema likely triggered by atrial  fibrillation with RVR.  CTA without PE.  Possible signs of aspiration.  No signs of infectious process. Plan: Lasix 40 mg IV daily Wean oxygen as tolerated    Acute heart failure with preserved ejection fraction (HFpEF) (HCC) Likely triggered by atrial fibrillation with RVR.  BNP is elevated at 512, higher than previous admission. Plan: Lasix 40 mg IV daily Strict ins and outs Daily weights   Generalized weakness Likely due to multiple new comorbidities including atrial fibrillation with RVR and physical deconditioning. PT and OT   Anxiety and depression Continue home Xanax and Atarax   HTN (hypertension) - Hold home amlodipine given currently on diltiazem - Continue home enalapril   DM (diabetes mellitus) (HCC) - Hold home insulin products - Continue home Farxiga - Semglee 18 units at bedtime - SSI, moderate   Chronic kidney disease, stage 3a (HCC) Renal function is currently improved compared to previous hospitalizations.   - Daily BMP   DVT prophylaxis: SQ Lovenox Code Status: DNR Family Communication: Disposition Plan: Status is: Inpatient Remains inpatient appropriate because: A-fib with RVR   Level of care: Telemetry Cardiac  Consultants:  None  Procedures:  None  Antimicrobials: None   Subjective: Seen and examined.  Resting in bed.  Does endorse some fatigue and palpitations.  Objective: Vitals:   02/26/23 0900 02/26/23 1000 02/26/23 1100 02/26/23 1200  BP:  112/82    Pulse: 72 (!) 114 99   Resp: (!) 28 (!) 23 16   Temp:    97.8 F (36.6 C)  TempSrc:    Oral  SpO2: 92% 94% 94%   Weight:        Intake/Output Summary (Last 24 hours) at 02/26/2023  1408 Last data filed at 02/26/2023 0357 Gross per 24 hour  Intake 612.11 ml  Output 300 ml  Net 312.11 ml   Filed Weights   02/25/23 1456  Weight: 86.1 kg    Examination:  General exam: Appears calm and comfortable  Respiratory system: Bibasilar crackles.  Normal work of breathing.  4  L Cardiovascular system: S1-S2, tachycardic, irregular rhythm, no murmurs, no pedal edema Gastrointestinal system: Soft, NT/ND, normal bowel sounds Central nervous system: Alert and oriented. No focal neurological deficits. Extremities: Symmetric 5 x 5 power. Skin: No rashes, lesions or ulcers Psychiatry: Judgement and insight appear normal. Mood & affect appropriate.     Data Reviewed: I have personally reviewed following labs and imaging studies  CBC: Recent Labs  Lab 02/25/23 1049 02/26/23 0629  WBC 11.1* 8.3  HGB 14.3 13.1  HCT 44.2 39.7  MCV 92.5 90.2  PLT 281 247   Basic Metabolic Panel: Recent Labs  Lab 02/25/23 1049 02/25/23 1050 02/26/23 0629  NA 138  --  136  K 3.8  --  3.5  CL 102  --  98  CO2 26  --  27  GLUCOSE 86  --  150*  BUN 28*  --  29*  CREATININE 0.99  --  1.09*  CALCIUM 9.4  --  8.9  MG  --  2.1 2.0   GFR: Estimated Creatinine Clearance: 40.1 mL/min (A) (by C-G formula based on SCr of 1.09 mg/dL (H)). Liver Function Tests: No results for input(s): "AST", "ALT", "ALKPHOS", "BILITOT", "PROT", "ALBUMIN" in the last 168 hours. No results for input(s): "LIPASE", "AMYLASE" in the last 168 hours. No results for input(s): "AMMONIA" in the last 168 hours. Coagulation Profile: No results for input(s): "INR", "PROTIME" in the last 168 hours. Cardiac Enzymes: No results for input(s): "CKTOTAL", "CKMB", "CKMBINDEX", "TROPONINI" in the last 168 hours. BNP (last 3 results) No results for input(s): "PROBNP" in the last 8760 hours. HbA1C: No results for input(s): "HGBA1C" in the last 72 hours. CBG: Recent Labs  Lab 02/25/23 1651 02/25/23 2308 02/26/23 0836 02/26/23 1235  GLUCAP 101* 169* 132* 183*   Lipid Profile: No results for input(s): "CHOL", "HDL", "LDLCALC", "TRIG", "CHOLHDL", "LDLDIRECT" in the last 72 hours. Thyroid Function Tests: No results for input(s): "TSH", "T4TOTAL", "FREET4", "T3FREE", "THYROIDAB" in the last 72 hours. Anemia  Panel: No results for input(s): "VITAMINB12", "FOLATE", "FERRITIN", "TIBC", "IRON", "RETICCTPCT" in the last 72 hours. Sepsis Labs: No results for input(s): "PROCALCITON", "LATICACIDVEN" in the last 168 hours.  No results found for this or any previous visit (from the past 240 hour(s)).       Radiology Studies: CT Angio Chest PE W/Cm &/Or Wo Cm  Result Date: 02/25/2023 CLINICAL DATA:  Pulmonary embolism (PE) suspected, high prob. Weakness. Shortness of breath. EXAM: CT ANGIOGRAPHY CHEST WITH CONTRAST TECHNIQUE: Multidetector CT imaging of the chest was performed using the standard protocol during bolus administration of intravenous contrast. Multiplanar CT image reconstructions and MIPs were obtained to evaluate the vascular anatomy. RADIATION DOSE REDUCTION: This exam was performed according to the departmental dose-optimization program which includes automated exposure control, adjustment of the mA and/or kV according to patient size and/or use of iterative reconstruction technique. CONTRAST:  75mL OMNIPAQUE IOHEXOL 350 MG/ML SOLN COMPARISON:  CT angiography chest from 01/30/2023. FINDINGS: Cardiovascular: No evidence of embolism to the proximal subsegmental pulmonary artery level. Mild cardiomegaly. No pericardial effusion. No aortic aneurysm. There are coronary artery calcifications, in keeping with coronary artery disease. There are also  mild-to-moderate peripheral atherosclerotic vascular calcifications of thoracic aorta and its major branches. There is dilation of the main pulmonary trunk measuring up to 3.3 cm, which is nonspecific but can be seen with pulmonary artery hypertension. Mediastinum/Nodes: Visualized thyroid gland appears grossly unremarkable. No solid / cystic mediastinal masses. The esophagus is nondistended precluding optimal assessment. There are few mildly prominent mediastinal and hilar lymph nodes, which do not meet the size criteria for lymphadenopathy and appear grossly  similar to the prior study, favoring benign etiology. No axillary lymphadenopathy by size criteria. Lungs/Pleura: The central tracheo-bronchial tree is patent. There is mild, smooth, circumferential thickening of the segmental and subsegmental bronchial walls, throughout bilateral lungs, which is nonspecific. Findings are most commonly seen with pulmonary edema, bronchitis or reactive airway disease, such as asthma. Redemonstration of filling defects in the bilateral lower lobe subsegmental bronchi, likely due to mucus/secretions or aspiration. There are patchy areas of smooth interlobular septal thickening throughout bilateral lungs and bilateral trace pleural effusions. There are patchy areas of linear, plate-like atelectasis and/or scarring throughout bilateral lungs. No mass or consolidation. No pneumothorax. No suspicious lung nodules. Upper Abdomen: Visualized upper abdominal viscera within normal limits. Musculoskeletal: The visualized soft tissues of the chest wall are grossly unremarkable. No suspicious osseous lesions. There are mild multilevel degenerative changes in the visualized spine. Review of the MIP images confirms the above findings. IMPRESSION: *No evidence of pulmonary embolism to the proximal subsegmental pulmonary artery level. *There is mild smooth interlobular septal thickening throughout bilateral lungs with bilateral trace pleural effusions favoring congestive heart failure/pulmonary edema. *Multiple other nonacute observations, as described above. Electronically Signed   By: Jules Schick M.D.   On: 02/25/2023 14:34   DG Chest Port 1 View  Result Date: 02/25/2023 CLINICAL DATA:  Shortness of breath. EXAM: PORTABLE CHEST 1 VIEW COMPARISON:  Sep 15, 2019. FINDINGS: Mild cardiomegaly is noted. Mild central pulmonary vascular congestion is noted. Minimal bibasilar pulmonary edema is noted. Status post left shoulder arthroplasty. IMPRESSION: Mild cardiomegaly with mild central pulmonary  vascular congestion and minimal bibasilar pulmonary edema. Electronically Signed   By: Lupita Raider M.D.   On: 02/25/2023 13:22        Scheduled Meds:  ALPRAZolam  0.5 mg Oral QHS   cyanocobalamin  1,000 mcg Oral Daily   dapagliflozin propanediol  10 mg Oral Daily   diltiazem  30 mg Oral Q6H   enalapril  20 mg Oral BID   enoxaparin (LOVENOX) injection  40 mg Subcutaneous Q24H   feeding supplement  237 mL Oral TID BM   fenofibrate  54 mg Oral Daily   furosemide  40 mg Intravenous Daily   gabapentin  200 mg Oral TID   hydrOXYzine  25 mg Oral BID   insulin aspart  0-15 Units Subcutaneous TID WC   insulin glargine-yfgn  18 Units Subcutaneous QHS   magnesium oxide  800 mg Oral TID   metoprolol tartrate  25 mg Oral BID   multivitamin with minerals  1 tablet Oral Daily   pantoprazole  40 mg Oral Daily   prednisoLONE acetate  1 drop Both Eyes QHS   rosuvastatin  10 mg Oral QHS   Continuous Infusions:  diltiazem (CARDIZEM) infusion 5 mg/hr (02/26/23 0531)     LOS: 1 day       Tresa Moore, MD [facility]   If 7PM-7AM, please contact night-coverage  02/26/2023, 2:08 PM

## 2023-02-26 NOTE — Evaluation (Signed)
Occupational Therapy Evaluation Patient Details Name: Samantha Freeman MRN: [id number] DOB: 01-14-1936 Today's Date: 02/26/2023   History of Present Illness Samantha Freeman is a 87 y.o. female with medical history significant of recently diagnosed atrial fibrillation not on [facility], recently diagnosed CHF, type 2 diabetes, hypertension, hyperlipidemia, CKD stage IIIa, depression/anxiety, who presents to the ED due to atrial fibrillation.   Clinical Impression   Pt was seen for OT evaluation this date. Prior to hospital admission, pt was Mod I for ADL's, uses a RW at baseline , and son and his wife help with groceries. Pt lives alone. Pt presents to acute OT demonstrating impaired ADL performance and functional mobility 2/2 (See OT problem list for additional functional deficits). Upon arrival to room, pt supine in bed, agreeable to Eval. Pt's RR and O2 monitored during session. Pts RR during functional mobility 21-25 range, in the 30s at rest. Pt completed supine<>sit with supervision.   Pt completed donning socks seated at the EOB with supervision and set up. Pt completed a stand pivot from bedside to [facility] with Min A+RW and VC for hand placement. Pt returned to bed and required Min A+RW and VC for safety from stand>sit at the EOB. Noted balance to be unsteady when returning to bed. Pt left supine in bed with call bell within reach and all needs met. Pt would benefit from skilled OT services to address noted impairments and functional limitations (see below for any additional details) in order to maximize safety and independence while minimizing falls risk and caregiver burden. Anticipate the need for follow up OT services upon acute hospital DC.        If plan is discharge home, recommend the following: A little help with walking and/or transfers;A little help with bathing/dressing/bathroom;Assistance with cooking/housework;Assist for transportation;Help with stairs or ramp for entrance    Functional  Status Assessment  Patient has had a recent decline in their functional status and demonstrates the ability to make significant improvements in function in a reasonable and predictable amount of time.  Equipment Recommendations  Other (comment) (Defer to next venue of care)    Recommendations for Other Services       Precautions / Restrictions Precautions Precautions: Fall Restrictions Weight Bearing Restrictions: No      Mobility Bed Mobility Overal bed mobility: Needs Assistance Bed Mobility: Supine to Sit, Sit to Supine     Supine to sit: Supervision Sit to supine: Supervision     Patient Response: Cooperative  Transfers Overall transfer level: Needs assistance Equipment used: Rolling walker (2 wheels) Transfers: Sit to/from Stand Sit to Stand: Min assist           General transfer comment: VC for hand placement and safety.      Balance Overall balance assessment: Needs assistance Sitting-balance support: Feet supported Sitting balance-Leahy Scale: Good     Standing balance support: Bilateral upper extremity supported, During functional activity, Reliant on assistive device for balance Standing balance-Leahy Scale: Fair                             ADL either performed or assessed with clinical judgement   ADL Overall ADL's : Needs assistance/impaired                                     Functional mobility during ADLs: Minimal assistance;Supervision/safety;Set up General ADL Comments: Pt completed  donning socks seated at the EOB with supervision and set up. Pt completed a stand pivot from bedside to [facility] with Min A and VC for hand placement.     Vision Patient Visual Report: No change from baseline       Perception         Praxis         Pertinent Vitals/Pain Pain Assessment Pain Assessment: No/denies pain     Extremity/Trunk Assessment Upper Extremity Assessment Upper Extremity Assessment: Generalized  weakness   Lower Extremity Assessment Lower Extremity Assessment: Generalized weakness   Cervical / Trunk Assessment Cervical / Trunk Assessment: Normal   Communication Communication Communication: No apparent difficulties Cueing Techniques: Verbal cues   Cognition Arousal: Alert Behavior During Therapy: WFL for tasks assessed/performed Overall Cognitive Status: Within Functional Limits for tasks assessed                                       General Comments       Exercises     Shoulder Instructions      Home Living Family/patient expects to be discharged to:: Private residence Living Arrangements: Alone Available Help at Discharge: Family;Available PRN/intermittently (had help from son and his wife for groceries but no one is able to help her currently.) Type of Home: House (Condo) Home Access: Level entry     Home Layout: One level     Bathroom Shower/Tub: Producer, television/film/video: Handicapped height Bathroom Accessibility: Yes   Home Equipment: Cane - single point;Standard Environmental consultant;Shower seat - built in;Grab bars - tub/shower;Hand held shower head          Prior Functioning/Environment Prior Level of Function : Independent/Modified Independent;Driving             Mobility Comments: Pt reports using a RW(4wheels) at baseline. Pt reports that she hasn't been going out into the community much d/t to fatigue. Pt reports son and his wife help her with groceries. Pt still drives but hasn't done much because of how she has been feeling. ADLs Comments: mod I, uses weekly pill box for medications, light meal prep.        OT Problem List: Decreased activity tolerance;Impaired balance (sitting and/or standing);Decreased knowledge of use of DME or AE;Decreased safety awareness;Cardiopulmonary status limiting activity;Decreased strength;Decreased coordination      OT Treatment/Interventions: Self-care/ADL training;Therapeutic  exercise;Therapeutic activities;DME and/or AE instruction;Energy conservation;Patient/family education;Balance training    OT Goals(Current goals can be found in the care plan section) Acute Rehab OT Goals Patient Stated Goal: to get better OT Goal Formulation: With patient Time For Goal Achievement: 03/12/23 Potential to Achieve Goals: Good ADL Goals Pt Will Perform Grooming: sitting;standing;with modified independence Pt Will Perform Lower Body Dressing: sit to/from stand;with modified independence Pt Will Transfer to Toilet: with supervision;ambulating;bedside commode  OT Frequency: Min 1X/week    Co-evaluation              AM-PAC OT "6 Clicks" Daily Activity     Outcome Measure Help from another person eating meals?: None Help from another person taking care of personal grooming?: A Little Help from another person toileting, which includes using toliet, bedpan, or urinal?: A Little Help from another person bathing (including washing, rinsing, drying)?: A Lot Help from another person to put on and taking off regular upper body clothing?: A Little Help from another person to put on and taking off regular  lower body clothing?: A Little 6 Click Score: 18   End of Session Equipment Utilized During Treatment: Rolling walker (2 wheels) Nurse Communication: Mobility status  Activity Tolerance: Patient tolerated treatment well Patient left: in bed;with call bell/phone within reach;with bed alarm set  OT Visit Diagnosis: Other abnormalities of gait and mobility (R26.89);Unsteadiness on feet (R26.81);Muscle weakness (generalized) (M62.81)                Time: 4166-0630 OT Time Calculation (min): 27 min Charges:  OT General Charges $OT Visit: 1 Visit OT Evaluation $OT Eval Low Complexity: 1 Low OT Treatments $Self Care/Home Management : 8-22 mins  Butch Penny, SOT

## 2023-02-26 NOTE — Evaluation (Addendum)
Physical Therapy Evaluation Patient Details Name: Samantha Freeman MRN: [id number] DOB: May 28, 1935 Today's Date: 02/26/2023  History of Present Illness  Samantha Freeman is a 87 y.o. female with medical history significant of recently diagnosed atrial fibrillation not on [facility], recently diagnosed CHF, type 2 diabetes, hypertension, hyperlipidemia, CKD stage IIIa, depression/anxiety, who presents to the ED on 02/25/23 due to atrial fibrillation.  Clinical Impression  Pt is received in bed, she is agreeable to PT session. At baseline, Pt reports household ambulation with use of RW, assist from son/family with IADLs, and no recent falls. Pt performs bed mobility supA, transfers and amb CGA for safety. VSS monitored throughout session with fluctuations of elevated HR and brief desat on 4L [place] to high 70s/low 80s, although improved with rest break. Pt able to amb approx 24 ft using RW requiring occasional cuing to maintain trunk closer to AD to promote safety. Pt would benefit from skilled PT to address above deficits and promote optimal return to PLOF.        If plan is discharge home, recommend the following: A little help with walking and/or transfers;A little help with bathing/dressing/bathroom;Assistance with cooking/housework;Assist for transportation   Can travel by private vehicle   Yes    Equipment Recommendations None recommended by PT  Recommendations for Other Services       Functional Status Assessment Patient has had a recent decline in their functional status and demonstrates the ability to make significant improvements in function in a reasonable and predictable amount of time.     Precautions / Restrictions Precautions Precautions: Fall Restrictions Weight Bearing Restrictions: No      Mobility  Bed Mobility Overal bed mobility: Needs Assistance Bed Mobility: Supine to Sit     Supine to sit: Supervision     General bed mobility comments: Pt able to perform bed mobility  supA with no cuing for hand placement or use of railings    Transfers Overall transfer level: Needs assistance Equipment used: Rolling walker (2 wheels) Transfers: Sit to/from Stand Sit to Stand: Contact guard assist           General transfer comment: Pt able to perform x2 STS from bed and BSC CGA for safety and with use of RW; no cuing needed to facilitate task    Ambulation/Gait Ambulation/Gait assistance: Contact guard assist Gait Distance (Feet): 24 Feet Assistive device: Rolling walker (2 wheels) Gait Pattern/deviations: Step-through pattern, Decreased step length - right, Decreased step length - left, Decreased stride length Gait velocity: Decreased     General Gait Details: Pt able to amb approx 24 ft using RW with occasional cuing to maintain trunk closer to AD; Pt defers walking outside room at this time  Stairs            Wheelchair Mobility     Tilt Bed    Modified Rankin (Stroke Patients Only)       Balance Overall balance assessment: Needs assistance Sitting-balance support: Feet supported Sitting balance-Leahy Scale: Normal Sitting balance - Comments: Pt able to maintain seated EOB balance during functional activities without LOB noted   Standing balance support: Bilateral upper extremity supported, During functional activity, Reliant on assistive device for balance Standing balance-Leahy Scale: Good Standing balance comment: Pt able to maintain standing balance with use of RW during functional activities                             Pertinent Vitals/Pain Pain Assessment Pain  Assessment: No/denies pain    Home Living Family/patient expects to be discharged to:: Private residence Living Arrangements: Alone Available Help at Discharge: Family;Available PRN/intermittently (son helps with groceries) Type of Home: House (condo) Home Access: Level entry       Home Layout: One level Home Equipment: Cane - single point;Standard  Environmental consultant;Shower seat - built in;Grab bars - tub/shower;Hand held Stage manager (4 wheels) Additional Comments: Pt reports friend recently lend her rollator    Prior Function Prior Level of Function : Independent/Modified Independent;Driving             Mobility Comments: Pt reports using a 4W RW at baseline. Pt reports that she hasn't been going out into the community much d/t to fatigue. Pt reports son and his wife help her with groceries. Pt still drives but hasn't done much because of how she has been feeling. ADLs Comments: mod I, uses weekly pill box for medications, light meal prep.     Extremity/Trunk Assessment   Upper Extremity Assessment Upper Extremity Assessment: Generalized weakness    Lower Extremity Assessment Lower Extremity Assessment: Generalized weakness    Cervical / Trunk Assessment Cervical / Trunk Assessment: Normal  Communication   Communication Communication: No apparent difficulties Cueing Techniques: Verbal cues  Cognition Arousal: Alert Behavior During Therapy: WFL for tasks assessed/performed Overall Cognitive Status: Within Functional Limits for tasks assessed                                 General Comments: AO x4; pleasant and motivated to work with PT        General Comments General comments (skin integrity, edema, etc.): VSS monitored throughout with frequent fluctuations of HR and brief desat into high 70s/low 80s on 4L [place] but able to recover to 94% on SpO2% 4L [place]    Exercises Other Exercises Other Exercises: Toileting; supA for peri-care for safety   Assessment/Plan    PT Assessment Patient needs continued PT services  PT Problem List Decreased strength;Decreased activity tolerance;Decreased balance;Decreased mobility;Cardiopulmonary status limiting activity       PT Treatment Interventions DME instruction;Gait training;Functional mobility training;Therapeutic activities;Therapeutic exercise    PT Goals  (Current goals can be found in the Care Plan section)  Acute Rehab PT Goals Patient Stated Goal: to go home PT Goal Formulation: With patient Time For Goal Achievement: 03/12/23 Potential to Achieve Goals: Good    Frequency Min 1X/week     Co-evaluation               AM-PAC PT "6 Clicks" Mobility  Outcome Measure Help needed turning from your back to your side while in a flat bed without using bedrails?: None Help needed moving from lying on your back to sitting on the side of a flat bed without using bedrails?: None Help needed moving to and from a bed to a chair (including a wheelchair)?: A Little Help needed standing up from a chair using your arms (e.g., wheelchair or bedside chair)?: A Little Help needed to walk in hospital room?: A Little Help needed climbing 3-5 steps with a railing? : A Lot 6 Click Score: 19    End of Session Equipment Utilized During Treatment: Gait belt Activity Tolerance: Patient tolerated treatment well;Patient limited by fatigue Patient left: in chair;with call bell/phone within reach;with chair alarm set Nurse Communication: Mobility status PT Visit Diagnosis: Unsteadiness on feet (R26.81);Muscle weakness (generalized) (M62.81);Difficulty in walking, not elsewhere classified (R26.2)  Time: 4132-4401 PT Time Calculation (min) (ACUTE ONLY): 25 min   Charges:   PT Evaluation $PT Eval Moderate Complexity: 1 Mod PT Treatments $Therapeutic Activity: 8-22 mins PT General Charges $$ ACUTE PT VISIT: 1 Visit         Elmon Else, SPT   Lamontae Ricardo 02/26/2023, 3:32 PM

## 2023-02-26 NOTE — Progress Notes (Signed)
Initial Nutrition Assessment  DOCUMENTATION CODES:   Obesity unspecified  INTERVENTION:   -Ensure Enlive po TID, each supplement provides 350 kcal and 20 grams of protein -MVI with minerals daily -Liberalize diet to 2 gram sodium for wider variety of meal selections  NUTRITION DIAGNOSIS:   Inadequate oral intake related to poor appetite as evidenced by per patient/family report, meal completion < 50%.  GOAL:   Patient will meet greater than or equal to 90% of their needs  MONITOR:   PO intake, Supplement acceptance  REASON FOR ASSESSMENT:   Consult Assessment of nutrition requirement/status, Poor PO  ASSESSMENT:   Pt with medical history significant of recently diagnosed atrial fibrillation not on [facility], recently diagnosed CHF, type 2 diabetes, hypertension, hyperlipidemia, CKD stage IIIa, depression/anxiety, who presents due to atrial fibrillation.  Pt admitted with atrial fibrillation with RVR.   Reviewed I/O's: +312 ml x 24 hours  UOP: 300 ml x 24 hours  Spoke with pt and friend at bedside. Pt was pleasant and in good spirits today and reports that she feels better since being admitted to the hospital. Pt reports a general decline in health over the past 2 weeks. She reports she had a very poor appetite and di not feel like eating because "everything would come back up" and had no desire to eat. Pt reports she was unable to keep anything down during this time period. Additionally, pt reports feeling more weak, however, no fall. She was able to perform all of her ADLs, but reports it was more difficult for her to do so.   Prior to acute illness, pt reports great appetite. She reports she usually eats 3 melas per day, consisting of a meat, starch, and vegetable.   Pt does not think she has lost weight, but reports she is unsure as she is unable to read her home scale. Reviewed wt hx; pt has experienced a 2.9% wt loss over the past month, which is not significant for time  frame.   Pt consumed a half a muffin and a few bites of eggs and reports "this is the most I've eaten in a week". Discussed importance of good meal and supplement intake to promote healing. Pt amenable to Ensure (was drinking Boost and Glucerna PTA). Will also liberalize diet for wider variety of meal selections, as pt currenlty on a heart healthy, carb modified diet. Pt complained about nausea, especially when eating hot, heavy foods; encouraged pt to consume colder foods to reduce food odors.   Medications reviewed and include vitamin B-12, cardizem, lovenox, lasix, neurontin, and magnesium oxide.   Lab Results  Component Value Date   HGBA1C 7.6 (H) 01/31/2023   PTA DM medications are 36 units insulin glargine daily and 7-13 units insulin lispro TID. Pt reports good blood sugar control at home, but expressed concern over higher blood sugars in hospital, despite poor oral intake. Discussed acute stress response and how this impacts blood sugars- reviewed inpatient vs outpatient DM management and how DM is typically managed in the hospital. Per ADA's Standards of Medical Care for Diabetes, glycemic targets for elderly patients who are otherwise healthy (few medical impairments) and cognitively intact should be less stringent (Hgb A1c <7.5).    Labs reviewed: CBGS: 101-169 (inpatient orders for glycemic control are 0-15 units insulin aspart TID with meals and 18 units insulin glargine-yfgn daily).    NUTRITION - FOCUSED PHYSICAL EXAM:  Flowsheet Row Most Recent Value  Orbital Region No depletion  Upper Arm Region Mild  depletion  Buccal Region No depletion  Temple Region No depletion  Clavicle Bone Region No depletion  Clavicle and Acromion Bone Region No depletion  Scapular Bone Region No depletion  Dorsal Hand No depletion  Patellar Region No depletion  Anterior Thigh Region No depletion  Posterior Calf Region No depletion  Edema (RD Assessment) Mild  Hair Reviewed  Eyes Reviewed  Mouth  Reviewed  Skin Reviewed  Nails Reviewed        Diet Order:   Diet Order             Diet 2 gram sodium Fluid consistency: Thin  Diet effective now                   EDUCATION NEEDS:   Education needs have been addressed  Skin:  Skin Assessment: Reviewed RN Assessment  Last BM:  03/03/23  Height:   Ht Readings from Last 1 Encounters:  01/30/23 5\' 4"  (1.626 m)    Weight:   Wt Readings from Last 1 Encounters:  02/25/23 86.1 kg    Ideal Body Weight:  54.5 kg  BMI:  Body mass index is 32.58 kg/m.  Estimated Nutritional Needs:   Kcal:  1650-1850  Protein:  80-95 grams  Fluid:  > 1.6 L    Levada Schilling, RD, LDN, CDCES Registered Dietitian III Certified Diabetes Care and Education Specialist Please refer to [facility] for RD and/or RD on-call/weekend/after hours pager

## 2023-02-27 DIAGNOSIS — I4891 Unspecified atrial fibrillation: Secondary | ICD-10-CM | POA: Diagnosis not present

## 2023-02-27 LAB — GLUCOSE, CAPILLARY
Glucose-Capillary: 179 mg/dL — ABNORMAL HIGH (ref 70–99)
Glucose-Capillary: 241 mg/dL — ABNORMAL HIGH (ref 70–99)
Glucose-Capillary: 232 mg/dL — ABNORMAL HIGH (ref 70–99)
Glucose-Capillary: 233 mg/dL — ABNORMAL HIGH (ref 70–99)

## 2023-02-27 NOTE — NC FL2 (Signed)
[place] MEDICAID FL2 LEVEL OF CARE FORM     IDENTIFICATION  Patient Name: Samantha Freeman Birthdate: [birth year] Sex: female Admission Date (Current Location): 02/25/2023  [facility] and [place] Number:  Chiropodist and Address:  [facility], [address], [place], [place] [address]      Provider Number: [id number]  Attending Physician Name and Address:  Darlin Priestly, MD  Relative Name and Phone Number:       Current Level of Care: Hospital Recommended Level of Care: Skilled Nursing Facility Prior Approval Number:    Date Approved/Denied:   PASRR Number: [id number]  Discharge Plan: SNF    Current Diagnoses: Patient Active Problem List   Diagnosis Date Noted   Hypercholesteremia 02/25/2023   Acute heart failure with preserved ejection fraction (HFpEF) (HCC) 02/25/2023   Generalized weakness 02/25/2023   Atrial fibrillation with RVR (HCC) 02/02/2023   Dyslipidemia 02/02/2023   Anxiety and depression 02/02/2023   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 with body mass index (BMI) of 33.0 to 33.9 in adult 02/02/2023   Acute hypoxic respiratory failure (HCC) 01/30/2023   Status post reverse arthroplasty of shoulder, left 10/13/2020   Mild aortic stenosis 05/23/2020   Moderate pulmonary valve regurgitation 05/23/2020   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   Polyneuropathy associated with underlying disease (HCC) 03/13/2019   Acute respiratory disease due to COVID-19 virus 12/06/2018   Pneumonia due to COVID-19 virus 12/06/2018   Chronic kidney disease, stage 3a (HCC) 12/06/2018   DM (diabetes mellitus) (HCC) 12/06/2018   HTN (hypertension) 12/06/2018   PNA (pneumonia) 12/06/2018   Rotator cuff tear 05/13/2018   Moderate mitral insufficiency 03/16/2015   Moderate tricuspid insufficiency 03/16/2015   GERD (gastroesophageal reflux disease) 09/22/2013   Fuchs' corneal dystrophy 08/19/2013     Orientation RESPIRATION BLADDER Height & Weight     Self, Time, Situation, Place  O2 (Nasal Cannula 4 L) Continent Weight: 190 lb 0.6 oz (86.2 kg) Height:     BEHAVIORAL SYMPTOMS/MOOD NEUROLOGICAL BOWEL NUTRITION STATUS   (None)   Continent Diet (2 gram sodium)  AMBULATORY STATUS COMMUNICATION OF NEEDS Skin   Limited Assist Verbally Normal                       Personal Care Assistance Level of Assistance  Bathing, Feeding, Dressing Bathing Assistance: Limited assistance Feeding assistance: Limited assistance Dressing Assistance: Limited assistance     Functional Limitations Info  Sight, Hearing, Speech Sight Info: Adequate Hearing Info: Adequate Speech Info: Adequate    SPECIAL CARE FACTORS FREQUENCY  PT (By licensed PT), OT (By licensed OT)     PT Frequency: 5 x week OT Frequency: 5 x week            Contractures Contractures Info: Not present    Additional Factors Info  Code Status, Allergies Code Status Info: DNR Allergies Info: Buspirone, Propofol, Zolpidem, Cholestyramine, Codeine, Hydrochlorothiazide, Loratadine, Niacin           Current Medications (02/27/2023):  This is the current hospital active medication list Current Facility-Administered Medications  Medication Dose Route Frequency Provider Last Rate Last Admin   acetaminophen (TYLENOL) tablet 650 mg  650 mg Oral Q4H PRN Verdene Lennert, MD       ALPRAZolam Prudy Feeler) tablet 0.5 mg  0.5 mg Oral QHS Verdene Lennert, MD   0.5 mg at 02/26/23 2125   cyanocobalamin (VITAMIN B12) tablet 1,000 mcg  1,000 mcg  Oral Daily Verdene Lennert, MD   1,000 mcg at 02/27/23 0846   dapagliflozin propanediol (FARXIGA) tablet 10 mg  10 mg Oral Daily Verdene Lennert, MD   10 mg at 02/27/23 0846   diltiazem (CARDIZEM) 125 mg in dextrose 5% 125 mL (1 mg/mL) infusion  5-15 mg/hr Intravenous Titrated Verdene Lennert, MD   Stopped at 02/26/23 1350   diltiazem (CARDIZEM) tablet 30 mg  30 mg Oral Q6H Sreenath, Sudheer B,  MD   30 mg at 02/27/23 0603   enalapril (VASOTEC) tablet 20 mg  20 mg Oral BID Verdene Lennert, MD   20 mg at 02/27/23 0846   enoxaparin (LOVENOX) injection 40 mg  40 mg Subcutaneous Q24H Verdene Lennert, MD   40 mg at 02/26/23 2125   feeding supplement (ENSURE ENLIVE / ENSURE PLUS) liquid 237 mL  237 mL Oral TID BM Lolita Patella B, MD   237 mL at 02/27/23 0846   fenofibrate tablet 54 mg  54 mg Oral Daily Verdene Lennert, MD   54 mg at 02/27/23 0846   furosemide (LASIX) injection 40 mg  40 mg Intravenous Daily Verdene Lennert, MD   40 mg at 02/27/23 0845   gabapentin (NEURONTIN) capsule 200 mg  200 mg Oral TID Verdene Lennert, MD   200 mg at 02/27/23 0845   hydrOXYzine (ATARAX) tablet 25 mg  25 mg Oral BID Verdene Lennert, MD   25 mg at 02/27/23 0845   insulin aspart (novoLOG) injection 0-15 Units  0-15 Units Subcutaneous TID WC Verdene Lennert, MD   3 Units at 02/27/23 0846   insulin glargine-yfgn (SEMGLEE) injection 18 Units  18 Units Subcutaneous QHS Verdene Lennert, MD   18 Units at 02/26/23 2125   magnesium oxide (MAG-OX) tablet 800 mg  800 mg Oral TID Verdene Lennert, MD   800 mg at 02/27/23 0846   metoprolol tartrate (LOPRESSOR) tablet 25 mg  25 mg Oral BID Verdene Lennert, MD   25 mg at 02/27/23 0846   multivitamin with minerals tablet 1 tablet  1 tablet Oral Daily Lolita Patella B, MD   1 tablet at 02/27/23 0846   ondansetron (ZOFRAN) injection 4 mg  4 mg Intravenous Q6H PRN Verdene Lennert, MD       pantoprazole (PROTONIX) EC tablet 40 mg  40 mg Oral Daily Verdene Lennert, MD   40 mg at 02/27/23 0846   prednisoLONE acetate (PRED FORTE) 1 % ophthalmic suspension 1 drop  1 drop Both Eyes QHS Verdene Lennert, MD   1 drop at 02/26/23 2130   rosuvastatin (CRESTOR) tablet 10 mg  10 mg Oral QHS Verdene Lennert, MD   10 mg at 02/26/23 2125   traZODone (DESYREL) tablet 50 mg  50 mg Oral QHS PRN Verdene Lennert, MD         Discharge Medications: Please see discharge summary for a list of  discharge medications.  Relevant Imaging Results:  Relevant Lab Results:   Additional Information SS#: [id number]. Went to [facility] 10/14-10/19 (5 days).  Margarito Liner, LCSW

## 2023-02-27 NOTE — Progress Notes (Signed)
Occupational Therapy Treatment Patient Details Name: Samantha Freeman MRN: [id number] DOB: [birth year] Today's Date: 02/27/2023   History of present illness Samantha Freeman is a 87 y.o. female with medical history significant of recently diagnosed atrial fibrillation not on [facility], recently diagnosed CHF, type 2 diabetes, hypertension, hyperlipidemia, CKD stage IIIa, depression/anxiety, who presents to the ED on 02/25/23 due to atrial fibrillation.   OT comments  Pt seen for OT treatment on this date. Upon arrival to room pt supine in bed, agreeable to tx. O2 via [place] off of pt upon entering, O2 monitored, VSS in the 90's and kept on RM air during session. Rn notified. Pt's HR elevated and fluctuated during session, monitored t/o, HR 120-135. Pt completed supine>sit at the EOB with supervision. Pt completed donning socks seated at the EOB with supervision and set up. Pt ambulated to the bathroom and completed a toilet t/f with Min G-Min A+RW. Pt completed hygiene with supervision with sitting/lateral leans. Pt required VC for hand placement and proper technique for RW. Pt handoff to PT. Pt making good progress toward goals, will continue to follow POC. Discharge recommendation remains appropriate.        If plan is discharge home, recommend the following:  A little help with walking and/or transfers;A little help with bathing/dressing/bathroom;Assistance with cooking/housework;Assist for transportation;Help with stairs or ramp for entrance   Equipment Recommendations  Other (comment)    Recommendations for Other Services      Precautions / Restrictions Precautions Precautions: Fall Restrictions Weight Bearing Restrictions: No       Mobility Bed Mobility Overal bed mobility: Needs Assistance Bed Mobility: Supine to Sit     Supine to sit: Supervision       Patient Response: Cooperative  Transfers Overall transfer level: Needs assistance Equipment used: Rolling walker (2  wheels) Transfers: Sit to/from Stand Sit to Stand: Contact guard assist, Min assist                 Balance Overall balance assessment: Needs assistance Sitting-balance support: Feet supported Sitting balance-Leahy Scale: Normal Sitting balance - Comments: Pt able to maintain seated EOB balance during functional activities without LOB noted   Standing balance support: Bilateral upper extremity supported, During functional activity, Reliant on assistive device for balance Standing balance-Leahy Scale: Good Standing balance comment: Pt able to maintain standing balance with use of RW during functional activities                           ADL either performed or assessed with clinical judgement   ADL Overall ADL's : Needs assistance/impaired                                 Tub/ Shower Transfer: Contact guard assist;Minimal assistance;Rolling walker (2 wheels);Cueing for safety     General ADL Comments: Pt completed donning socks seated at the EOB with supervision and set up. Pt ambulated to the bathroom and completed a toilet t/f with Min G-Min A+RW. Pt completed hygiene with supervision in sitting/lateral leans. Pt required some VC for hand placement.    Extremity/Trunk Assessment Upper Extremity Assessment Upper Extremity Assessment: Generalized weakness   Lower Extremity Assessment Lower Extremity Assessment: Generalized weakness   Cervical / Trunk Assessment Cervical / Trunk Assessment: Normal    Vision Patient Visual Report: No change from baseline     Perception     Praxis  Cognition Arousal: Alert Behavior During Therapy: WFL for tasks assessed/performed Overall Cognitive Status: Within Functional Limits for tasks assessed                                 General Comments: AO x4; pleasant and motivated to work with OT        Exercises      Shoulder Instructions       General Comments VSS monitored t/o with  frequent fluctuations of HR and on RM air during session, O2 in 90's.    Pertinent Vitals/ Pain       Pain Assessment Pain Assessment: No/denies pain  Home Living                                          Prior Functioning/Environment              Frequency  Min 1X/week        Progress Toward Goals  OT Goals(current goals can now be found in the care plan section)  Progress towards OT goals: Progressing toward goals     Plan      Co-evaluation                 AM-PAC OT "6 Clicks" Daily Activity     Outcome Measure   Help from another person eating meals?: None Help from another person taking care of personal grooming?: A Little Help from another person toileting, which includes using toliet, bedpan, or urinal?: A Little Help from another person bathing (including washing, rinsing, drying)?: A Lot Help from another person to put on and taking off regular upper body clothing?: A Little Help from another person to put on and taking off regular lower body clothing?: A Little 6 Click Score: 18    End of Session Equipment Utilized During Treatment: Rolling walker (2 wheels)  OT Visit Diagnosis: Other abnormalities of gait and mobility (R26.89);Unsteadiness on feet (R26.81);Muscle weakness (generalized) (M62.81)   Activity Tolerance Patient tolerated treatment well   Patient Left Other (comment) (PT hand off)   Nurse Communication Mobility status        Time: 7829-5621 OT Time Calculation (min): 18 min  Charges: OT General Charges $OT Visit: 1 Visit OT Treatments $Self Care/Home Management : 8-22 mins  Butch Penny, SOT

## 2023-02-27 NOTE — Progress Notes (Signed)
PROGRESS NOTE    Evamae Rowen  ZDG:[id number] DOB: 12-11-1935 DOA: 02/25/2023 PCP: Marguarite Arbour, MD  [id number]  LOS: 2 days   Brief hospital course:   Assessment & Plan: 87 y.o. female with medical history significant of recently diagnosed atrial fibrillation not on [facility], recently diagnosed CHF, type 2 diabetes, hypertension, hyperlipidemia, CKD stage IIIa, depression/anxiety, who presents to the ED due to atrial fibrillation.   Mrs. Neyer states she has been experiencing generalized weakness, fatigue, DOE and orthopnea for the last few weeks, but her symptoms significantly worsened over the last 1-2 days.  She notes she occasionally experiences shortness of breath at rest but this comes and goes; when she experiences shortness of breath, it is sudden onset.  She endorses some chest rightness when shortness of breath starts but denies any at this time.  She denies any palpitations, nausea, vomiting, fever, chills.  She notes a poor appetite but denies any abdominal distention, weight loss or acute weight gain.  Atrial fibrillation with RVR (HCC) Patient presenting with ongoing weakness, fatigue, DOB with acute worsening over the last 1-2 days.  Telemetry with atrial fibrillation with RVR.  Patient stopped anticoagulation after developing epistaxis requiring packing, and declined anticoagulation.   --started on dilt gtt and oral dilt Plan: --resume dilt gtt --cont oral dilt 30 mg every 6 hours --cont Lopressor 25 mg BID --cardio consult tomorrow   Acute hypoxic respiratory failure (HCC) Patient has mild pulmonary edema likely triggered by atrial fibrillation with RVR.  CTA without PE.  No signs of infectious process. --started IV lasix 40 daily --cont IV lasix 40 mg daily --Continue supplemental O2 to keep sats >=90%, wean as tolerated  Acute heart failure with preserved ejection fraction (HFpEF) (HCC) Likely triggered by atrial fibrillation with RVR.  BNP is elevated at 512,  higher than previous admission. Plan: --cont IV lasix 40 daily   Generalized weakness Likely due to multiple new comorbidities including atrial fibrillation with RVR and physical deconditioning. PT and OT --SNF rehab   Anxiety and depression Continue home Xanax and Atarax   HTN (hypertension) - Hold home amlodipine and enalapril due to soft BP   DM (diabetes mellitus) (HCC) - Hold home insulin products - Continue home Farxiga - Semglee 18 units at bedtime - SSI, moderate   Chronic kidney disease, stage 3a (HCC) Renal function is currently improved compared to previous hospitalizations. --monitor Cr while diuesing    DVT prophylaxis: Lovenox SQ Code Status: DNR  Family Communication:  Level of care: Telemetry Cardiac Dispo:   The patient is from: home Anticipated d/c is to: home Anticipated d/c date is: 2-3 days   Subjective and Interval History:  Pt reported dyspnea improved.  Also said her O2 sats typically runs low.  Reported good urine output.   Objective: Vitals:   02/27/23 0803 02/27/23 1138 02/27/23 1541 02/27/23 1614  BP:      Pulse:      Resp:      Temp: 97.7 F (36.5 C) 98.6 F (37 C)  98.3 F (36.8 C)  TempSrc:      SpO2:   95%   Weight:        Intake/Output Summary (Last 24 hours) at 02/27/2023 1813 Last data filed at 02/27/2023 0301 Gross per 24 hour  Intake 360 ml  Output --  Net 360 ml   Filed Weights   02/25/23 1456 02/27/23 0500  Weight: 86.1 kg 86.2 kg    Examination:   Constitutional: NAD, AAOx3 HEENT:  conjunctivae and lids normal, EOMI CV: No cyanosis.   RESP: normal respiratory effort Extremities: No effusions, edema in BLE SKIN: warm, dry Neuro: II - XII grossly intact.   Psych: Normal mood and affect.  Appropriate judgement and reason   Data Reviewed: I have personally reviewed labs and imaging studies  Time spent: 50 minutes  Darlin Priestly, MD [facility] If 7PM-7AM, please contact night-coverage 02/27/2023,  6:13 PM

## 2023-02-27 NOTE — TOC Initial Note (Signed)
Transition of Care [facility]) - Initial/Assessment Note    Patient Details  Name: Samantha Freeman MRN: [id number] Date of Birth: May 09, 1935  Transition of Care [facility]) CM/SW Contact:    Margarito Liner, LCSW Phone Number: 02/27/2023, 12:22 PM  Clinical Narrative:  CSW met with patient. No supports at bedside. CSW introduced role and explained that therapy recommendations would be discussed. Patient is agreeable to SNF placement. She went to [facility] from 10/14-10/19 (5 days) and does not want to return there. CSW sent referral to all other [facility] facilities. No further concerns. CSW encouraged patient to contact CSW as needed. CSW will continue to follow patient for support and facilitate discharge to SNF once medically stable.                Expected Discharge Plan: Skilled Nursing Facility Barriers to Discharge: Continued Medical Work up   Patient Goals and CMS Choice            Expected Discharge Plan and Services     Post Acute Care Choice: Skilled Nursing Facility Living arrangements for the past 2 months: Apartment                                      Prior Living Arrangements/Services Living arrangements for the past 2 months: Apartment Lives with:: Self Patient language and need for interpreter reviewed:: Yes Do you feel safe going back to the place where you live?: Yes      Need for Family Participation in Patient Care: Yes (Comment) Care giver support system in place?: Yes (comment)   Criminal Activity/Legal Involvement Pertinent to Current Situation/Hospitalization: No - Comment as needed  Activities of Daily Living   ADL Screening (condition at time of admission) Independently performs ADLs?: Yes (appropriate for developmental age) Is the patient deaf or have difficulty hearing?: No Does the patient have difficulty seeing, even when wearing glasses/contacts?: No Does the patient have difficulty concentrating, remembering, or making  decisions?: No  Permission Sought/Granted Permission sought to share information with : Facility Industrial/product designer granted to share information with : Yes, Verbal Permission Granted     Permission granted to share info w AGENCY: SNF's        Emotional Assessment Appearance:: Appears stated age Attitude/Demeanor/Rapport: Engaged, Gracious Affect (typically observed): Accepting, Appropriate, Calm, Pleasant Orientation: : Oriented to Self, Oriented to Place, Oriented to  Time, Oriented to Situation Alcohol / Substance Use: Not Applicable Psych Involvement: No (comment)  Admission diagnosis:  Acute pulmonary edema (HCC) [J81.0] Exertional dyspnea [R06.09] Atrial fibrillation with rapid ventricular response (HCC) [I48.91] Generalized weakness [R53.1] Atrial fibrillation with RVR (HCC) [I48.91] Nonspecific chest pain [R07.9] Other fatigue [R53.83] Patient Active Problem List   Diagnosis Date Noted   Hypercholesteremia 02/25/2023   Acute heart failure with preserved ejection fraction (HFpEF) (HCC) 02/25/2023   Generalized weakness 02/25/2023   Atrial fibrillation with RVR (HCC) 02/02/2023   Dyslipidemia 02/02/2023   Anxiety and depression 02/02/2023   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 with body mass index (BMI) of 33.0 to 33.9 in adult 02/02/2023   Acute hypoxic respiratory failure (HCC) 01/30/2023   Status post reverse arthroplasty of shoulder, left 10/13/2020   Mild aortic stenosis 05/23/2020   Moderate pulmonary valve regurgitation 05/23/2020   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   Polyneuropathy associated with underlying disease (HCC) 03/13/2019   Acute  respiratory disease due to COVID-19 virus 12/06/2018   Pneumonia due to COVID-19 virus 12/06/2018   Chronic kidney disease, stage 3a (HCC) 12/06/2018   DM (diabetes mellitus) (HCC) 12/06/2018   HTN (hypertension) 12/06/2018   PNA (pneumonia) 12/06/2018    Rotator cuff tear 05/13/2018   Moderate mitral insufficiency 03/16/2015   Moderate tricuspid insufficiency 03/16/2015   GERD (gastroesophageal reflux disease) 09/22/2013   Fuchs' corneal dystrophy 08/19/2013   PCP:  Marguarite Arbour, MD Pharmacy:   [facility] [phone] - [place], [place] - [address] AT [facility] OF [address] [place] [address] [place] [address] Phone: [phone] Fax: [phone]     Social Determinants of Health (SDOH) Social History: SDOH Screenings   Food Insecurity: No Food Insecurity (02/25/2023)  Housing: Low Risk  (02/25/2023)  Transportation Needs: No Transportation Needs (02/25/2023)  Utilities: Not At Risk (02/25/2023)  Tobacco Use: Low Risk  (02/25/2023)   Received from [facility]   SDOH Interventions:     Readmission Risk Interventions     No data to display

## 2023-02-27 NOTE — Progress Notes (Signed)
Physical Therapy Treatment Patient Details Name: Lakeisha Waldrop MRN: [id number] DOB: 01-03-1936 Today's Date: 02/27/2023   History of Present Illness Sherian Valenza is a 87 y.o. female with medical history significant of recently diagnosed atrial fibrillation not on [facility], recently diagnosed CHF, type 2 diabetes, hypertension, hyperlipidemia, CKD stage IIIa, depression/anxiety, who presents to the ED on 02/25/23 due to atrial fibrillation.    PT Comments  Patient received up with OT in bathroom. Patient reports she is doing well, does not want to keep coming back here. (To hospital). Patient ambulates with RW with cga. Slow pace. She is slightly unsteady. HR up to 140s with ambulation. O2 sats down below 88% on room air with activity. Supplemental O2 returned to patient once set up in recliner. She was able to perform B LE exercises in chair with cues only. Slight fatigue with these. Patient will continue to benefit from skilled PT to improve strength, endurance and independence with mobility.          If plan is discharge home, recommend the following: A little help with walking and/or transfers;A little help with bathing/dressing/bathroom;Assistance with cooking/housework;Assist for transportation   Can travel by private vehicle     Yes  Equipment Recommendations  None recommended by PT    Recommendations for Other Services       Precautions / Restrictions Precautions Precautions: Fall Restrictions Weight Bearing Restrictions: No     Mobility  Bed Mobility               General bed mobility comments: NT patient received up with OT in bathroom    Transfers Overall transfer level: Needs assistance Equipment used: Rolling walker (2 wheels) Transfers: Sit to/from Stand Sit to Stand: Contact guard assist                Ambulation/Gait Ambulation/Gait assistance: Contact guard assist Gait Distance (Feet): 15 Feet Assistive device: Rolling walker (2 wheels) Gait  Pattern/deviations: Step-through pattern, Decreased step length - right, Decreased step length - left, Decreased stride length Gait velocity: Decreased     General Gait Details: Patient ambulated from bathroom around bed to recliner. HR up to 140s intermittently. O2 sats down below 88%. (RN was concerned about HR, therefore mobility limited to room.)   Stairs             Wheelchair Mobility     Tilt Bed    Modified Rankin (Stroke Patients Only)       Balance Overall balance assessment: Modified Independent Sitting-balance support: Feet supported Sitting balance-Leahy Scale: Normal     Standing balance support: Bilateral upper extremity supported, During functional activity, Reliant on assistive device for balance Standing balance-Leahy Scale: Good Standing balance comment: Pt able to maintain standing balance with use of RW during functional activities                            Cognition Arousal: Alert Behavior During Therapy: WFL for tasks assessed/performed Overall Cognitive Status: Within Functional Limits for tasks assessed                                 General Comments: AO x4; pleasant and motivated        Exercises Other Exercises Other Exercises: B LE exercises to include: LAQ, heel slides, SLR, hip abd/add x 10 reps each    General Comments General comments (skin integrity, edema, etc.):  VSS monitored t/o with frequent fluctuations of HR and on RM air during session, O2 in 90's.      Pertinent Vitals/Pain Pain Assessment Pain Assessment: No/denies pain    Home Living                          Prior Function            PT Goals (current goals can now be found in the care plan section) Acute Rehab PT Goals Patient Stated Goal: to go home PT Goal Formulation: With patient Time For Goal Achievement: 03/12/23 Potential to Achieve Goals: Good Progress towards PT goals: Progressing toward goals     Frequency    Min 1X/week      PT Plan      Co-evaluation              AM-PAC PT "6 Clicks" Mobility   Outcome Measure  Help needed turning from your back to your side while in a flat bed without using bedrails?: None Help needed moving from lying on your back to sitting on the side of a flat bed without using bedrails?: None Help needed moving to and from a bed to a chair (including a wheelchair)?: A Little Help needed standing up from a chair using your arms (e.g., wheelchair or bedside chair)?: A Little Help needed to walk in hospital room?: A Little Help needed climbing 3-5 steps with a railing? : A Lot 6 Click Score: 19    End of Session Equipment Utilized During Treatment: Oxygen Activity Tolerance: Patient tolerated treatment well;Patient limited by fatigue;Treatment limited secondary to medical complications (Comment) (Increased HR) Patient left: in chair;with call bell/phone within reach;with chair alarm set Nurse Communication: Mobility status PT Visit Diagnosis: Muscle weakness (generalized) (M62.81);Difficulty in walking, not elsewhere classified (R26.2)     Time: 1610-9604 PT Time Calculation (min) (ACUTE ONLY): 12 min  Charges:    $Therapeutic Exercise: 8-22 mins PT General Charges $$ ACUTE PT VISIT: 1 Visit                     Presley Gora, PT, GCS 02/27/23,3:50 PM

## 2023-02-28 DIAGNOSIS — I4891 Unspecified atrial fibrillation: Secondary | ICD-10-CM | POA: Diagnosis not present

## 2023-02-28 LAB — CBC
HCT: 42.9 % (ref 36.0–46.0)
Hemoglobin: 14.2 g/dL (ref 12.0–15.0)
MCH: 29.3 pg (ref 26.0–34.0)
MCHC: 33.1 g/dL (ref 30.0–36.0)
MCV: 88.6 fL (ref 80.0–100.0)
Platelets: 266 10*3/uL (ref 150–400)
RBC: 4.84 MIL/uL (ref 3.87–5.11)
RDW: 15.2 % (ref 11.5–15.5)
WBC: 8 10*3/uL (ref 4.0–10.5)
nRBC: 0 % (ref 0.0–0.2)

## 2023-02-28 LAB — GLUCOSE, CAPILLARY
Glucose-Capillary: 239 mg/dL — ABNORMAL HIGH (ref 70–99)
Glucose-Capillary: 321 mg/dL — ABNORMAL HIGH (ref 70–99)
Glucose-Capillary: 245 mg/dL — ABNORMAL HIGH (ref 70–99)
Glucose-Capillary: 223 mg/dL — ABNORMAL HIGH (ref 70–99)

## 2023-02-28 LAB — BASIC METABOLIC PANEL
Anion gap: 12 (ref 5–15)
BUN: 44 mg/dL — ABNORMAL HIGH (ref 8–23)
CO2: 29 mmol/L (ref 22–32)
Calcium: 9.2 mg/dL (ref 8.9–10.3)
Chloride: 93 mmol/L — ABNORMAL LOW (ref 98–111)
Creatinine, Ser: 1.43 mg/dL — ABNORMAL HIGH (ref 0.44–1.00)
GFR, Estimated: 36 mL/min — ABNORMAL LOW (ref >60)
Glucose, Bld: 292 mg/dL — ABNORMAL HIGH (ref 70–99)
Potassium: 3.4 mmol/L — ABNORMAL LOW (ref 3.5–5.1)
Sodium: 134 mmol/L — ABNORMAL LOW (ref 135–145)

## 2023-02-28 LAB — MAGNESIUM: Magnesium: 2.4 mg/dL (ref 1.7–2.4)

## 2023-02-28 MED ORDER — POTASSIUM CHLORIDE CRYS ER 20 MEQ PO TBCR
40.0000 meq | EXTENDED_RELEASE_TABLET | Freq: Once | ORAL | Status: AC
Start: 2023-02-28 — End: 2023-02-28
  Administered 2023-02-28: 40 meq via ORAL
  Filled 2023-02-28: qty 2

## 2023-02-28 MED ORDER — MAGNESIUM SULFATE 2 GM/50ML IV SOLN
2.0000 g | Freq: Once | INTRAVENOUS | Status: AC
  Administered 2023-02-28: 2 g via INTRAVENOUS
  Filled 2023-02-28: qty 50

## 2023-02-28 MED ORDER — METOPROLOL TARTRATE 5 MG/5ML IV SOLN
5.0000 mg | INTRAVENOUS | Status: DC | PRN
  Administered 2023-03-07: 5 mg via INTRAVENOUS
  Filled 2023-02-28: qty 5

## 2023-02-28 MED ORDER — MAGNESIUM SULFATE 4 GM/100ML IV SOLN
4.0000 g | Freq: Once | INTRAVENOUS | Status: AC
  Administered 2023-02-28: 4 g via INTRAVENOUS
  Filled 2023-02-28: qty 100

## 2023-02-28 MED ORDER — DILTIAZEM HCL 30 MG PO TABS
60.0000 mg | ORAL_TABLET | Freq: Four times a day (QID) | ORAL | Status: DC
  Administered 2023-02-28 – 2023-03-01 (×3): 60 mg via ORAL
  Filled 2023-02-28 (×3): qty 2

## 2023-02-28 MED ORDER — INSULIN GLARGINE-YFGN 100 UNIT/ML ~~LOC~~ SOLN
25.0000 [IU] | Freq: Every day | SUBCUTANEOUS | Status: DC
  Administered 2023-02-28: 25 [IU] via SUBCUTANEOUS
  Filled 2023-02-28: qty 0.25

## 2023-02-28 MED ORDER — INSULIN ASPART 100 UNIT/ML IJ SOLN
4.0000 [IU] | Freq: Three times a day (TID) | INTRAMUSCULAR | Status: DC
  Administered 2023-02-28 – 2023-03-02 (×5): 4 [IU] via SUBCUTANEOUS
  Filled 2023-02-28 (×4): qty 1

## 2023-02-28 NOTE — Progress Notes (Signed)
Occupational Therapy Treatment Patient Details Name: Samantha Freeman MRN: [id number] DOB: 11/04/1935 Today's Date: 02/28/2023   History of present illness Samantha Freeman is a 87 y.o. female with medical history significant of recently diagnosed atrial fibrillation not on [facility], recently diagnosed CHF, type 2 diabetes, hypertension, hyperlipidemia, CKD stage IIIa, depression/anxiety, who presents to the ED on 02/25/23 due to atrial fibrillation.   OT comments  Pt. Was sitting up in recliner chair upon arrival SpO2 97% on 3LO2, HR 54-124 bpms. Pt. education was provided about energy conservation techniques for ADLs/IADL tasks, PLB techniques, and stress management. Reviewed Pt.'s daily routines, and work simplification strategies for meal preparation, transportation of  items, and DME. Pt. Was provided with a visual handout for energy conservation, and PLB techniques. Pt. Continues to benefit from OT services for ADL training, A/E training, and pt. Education about energy conservation, work simplification techniques, home modification, and DME.        If plan is discharge home, recommend the following:  A little help with walking and/or transfers;A little help with bathing/dressing/bathroom;Assistance with cooking/housework;Assist for transportation;Help with stairs or ramp for entrance   Equipment Recommendations       Recommendations for Other Services      Precautions / Restrictions Precautions Precautions: Fall Restrictions Weight Bearing Restrictions: No       Mobility Bed Mobility    Pt. Sitting up in recliner chair                Transfers    deferred                     Balance                                           ADL either performed or assessed with clinical judgement   ADL                       Lower Body Dressing: Supervision/safety;Contact guard assist                      Extremity/Trunk Assessment Upper  Extremity Assessment Upper Extremity Assessment: Generalized weakness            Vision Patient Visual Report: No change from baseline     Perception     Praxis      Cognition Arousal: Alert Behavior During Therapy: WFL for tasks assessed/performed Overall Cognitive Status: Within Functional Limits for tasks assessed                                 General Comments: AO x4; pleasant and motivated        Exercises      Shoulder Instructions       General Comments      Pertinent Vitals/ Pain       Pain Assessment Pain Assessment: No/denies pain  Home Living                                          Prior Functioning/Environment              Frequency  Min 1X/week        Progress  Toward Goals  OT Goals(current goals can now be found in the care plan section)  Progress towards OT goals: Progressing toward goals  Acute Rehab OT Goals Patient Stated Goal: To get better OT Goal Formulation: With patient Time For Goal Achievement: 03/12/23 Potential to Achieve Goals: Good  Plan      Co-evaluation                 AM-PAC OT "6 Clicks" Daily Activity     Outcome Measure   Help from another person eating meals?: None Help from another person taking care of personal grooming?: A Little Help from another person toileting, which includes using toliet, bedpan, or urinal?: A Little Help from another person bathing (including washing, rinsing, drying)?: A Lot Help from another person to put on and taking off regular upper body clothing?: A Little Help from another person to put on and taking off regular lower body clothing?: A Little 6 Click Score: 18    End of Session Equipment Utilized During Treatment: Rolling walker (2 wheels)  OT Visit Diagnosis: Other abnormalities of gait and mobility (R26.89);Unsteadiness on feet (R26.81);Muscle weakness (generalized) (M62.81)   Activity Tolerance Patient tolerated  treatment well   Patient Left Other (comment)   Nurse Communication Mobility status        Time: 4098-1191 OT Time Calculation (min): 26 min  Charges: OT General Charges $OT Visit: 1 Visit OT Treatments $Self Care/Home Management : 23-37 mins  [name], MS, OTR/L   [name] 02/28/2023, 2:35 PM

## 2023-02-28 NOTE — Inpatient Diabetes Management (Signed)
Inpatient Diabetes Program Recommendations  AACE/ADA: New Consensus Statement on Inpatient Glycemic Control (2015)  Target Ranges:  Prepandial:   less than 140 mg/dL      Peak postprandial:   less than 180 mg/dL (1-2 hours)      Critically ill patients:  140 - 180 mg/dL   Lab Results  Component Value Date   GLUCAP 321 (H) 02/28/2023   HGBA1C 7.6 (H) 01/31/2023    Latest Reference Range & Units 02/27/23 08:01 02/27/23 11:54 02/27/23 16:13 02/27/23 21:05 02/28/23 07:49 02/28/23 11:56  Glucose-Capillary 70 - 99 mg/dL 161 (H) 096 (H) 045 (H) 233 (H) 239 (H) 321 (H)  (H): Data is abnormally high  Review of Glycemic Control  Diabetes history: DM2 Outpatient Diabetes medications: Lantus 40 units at bedtime, Humalog 10-30 units TID with, Farxiga 10 mg daily Current orders for Inpatient glycemic control: Semglee 18 units at bedtime, Novolog 0-15 units TID, Farxiga 10 mg daily  Inpatient Diabetes Program Recommendations:   Please consider: -Increase Semglee to 25 units daily -Add Novolog 4 units tid meal coverage if eats 50% meals  Thank you, Darel Hong E. Crystalina Stodghill, RN, MSN, CDCES  Diabetes Coordinator Inpatient Glycemic Control Team Team Pager [phone] (8am-5pm) 02/28/2023 12:28 PM

## 2023-02-28 NOTE — Consult Note (Addendum)
[facility] CARDIOLOGY CONSULT NOTE       Patient ID: Samantha Freeman MRN: [id number] DOB/AGE: 85-Dec-1937 85 y.o.  Admit date: 02/25/2023 Referring Physician Dr. Darlin Priestly Primary Physician Sparks, Duane Lope, MD  Primary Cardiologist Leanora Ivanoff, PA Reason for Consultation atrial fibrillation RVR  HPI: Samantha Freeman is a 87 y.o. female  with a past medical history of recently diagnosed atrial fibrillation, mild aortic stenosis, bradycardia, type 2 diabetes, hypercholesterolemia who presented to the ED on 02/25/2023 for generalized weakness and fatigue. Found to be in AF RVR in the ED. Cardiology was consulted for further evaluation.   Patient reports that prior to admission she had been feeling poorly for a few days.  Endorses weakness and fatigue that was progressively worsening.  States that she went for a physical medicine appointment on 11/4 and they advised her to come to the ED for further evaluation.  Also endorses experiencing episodes of fluttering recently.  Found to be in atrial fibrillation in the ED on EKG.  Workup in the ED notable for creatinine 0.99, potassium 3.8, magnesium 2.1, hemoglobin 14.3.  BNP elevated at 512.  Troponins negative x 2.  CXR concerning for pulmonary vascular congestion.  She was started on IV diltiazem for rate control in the ED as well as IV Lasix for fluid management.   At the time of my evaluation today the patient is resting comfortably in hospital bed.  She has been on IV diltiazem, unable to wean off of IV due to poorly controlled rate.  We were asked to see her for further recommendations for rate control.  She states that overall she feels better than when she first came to the hospital.  We discussed her symptoms prior to her presentation to the ED.  She denies any recent chest pain, shortness of breath, dizziness or lightheadedness, syncope.  States that overall she still has some weakness but this is not as significant as it was.  States that she  cannot tell if her heart is racing.  Discussed her complications on Eliquis with nosebleeds.  Patient expressed that she would like to defer anticoagulation due to concerns that she will have more issues with bleeding.  Discussed stroke risk related to atrial fibrillation.  Review of systems complete and found to be negative unless listed above    Past Medical History:  Diagnosis Date   Anemia    Anxiety    Aortic stenosis, mild    Arthritis    CKD (chronic kidney disease), stage III (HCC)    Complication of anesthesia    Propofol anaphylaxis   Cortical cataract    DDD (degenerative disc disease), lumbar    Depression    Dyspnea    GERD (gastroesophageal reflux disease)    Headache    Heart murmur    History of 2019 novel coronavirus disease (COVID-19) 12/01/2018   HLD (hyperlipidemia)    Hypertension    Pneumonia    Schatzki's ring    Seasonal allergies    T2DM (type 2 diabetes mellitus) (HCC)    Valvular insufficiency     Past Surgical History:  Procedure Laterality Date   ABDOMINAL HYSTERECTOMY     APPENDECTOMY     BICEPT TENODESIS Right 05/13/2018   Procedure: BICEPS TENODESIS;  Surgeon: Christena Flake, MD;  Location: [facility] ORS;  Service: Orthopedics;  Laterality: Right;   CARDIAC CATHETERIZATION     COLONOSCOPY     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   EYE SURGERY  Right 09/2013   Corneal transplant   JOINT REPLACEMENT Right 2015   knee   REVERSE SHOULDER ARTHROPLASTY Left 10/13/2020   Procedure: REVERSE SHOULDER ARTHROPLASTY;  Surgeon: Christena Flake, MD;  Location: [facility] ORS;  Service: Orthopedics;  Laterality: Left;   SHOULDER ARTHROSCOPY WITH ROTATOR CUFF REPAIR Right 05/13/2018   Procedure: SHOULDER ARTHROSCOPY WITH DEBRIDEMENT, DECOMPRESSION AND ROTATOR CUFF REPAIR;  Surgeon: Christena Flake, MD;  Location: [facility] ORS;  Service: Orthopedics;  Laterality: Right;   TONSILLECTOMY      Medications Prior to Admission  Medication Sig Dispense Refill Last Dose    ALPRAZolam (XANAX) 0.5 MG tablet Take 0.5 mg by mouth at bedtime.   02/24/2023   amLODipine (NORVASC) 10 MG tablet Take 10 mg by mouth daily.   02/25/2023   aspirin 325 MG tablet Take 325 mg by mouth daily.   02/25/2023   Calcium Carb-Cholecalciferol (CALCIUM 600/VITAMIN D3 PO) Take 1 tablet by mouth daily.   02/25/2023   Coenzyme Q10 10 MG capsule Take 10 mg by mouth every morning.   02/25/2023   dapagliflozin propanediol (FARXIGA) 10 MG TABS tablet Take 10 mg by mouth daily.   02/25/2023   enalapril (VASOTEC) 20 MG tablet Take 20 mg by mouth 2 (two) times daily.   02/25/2023   fenofibrate (TRICOR) 48 MG tablet Take 1 tablet by mouth daily.   02/25/2023   furosemide (LASIX) 20 MG tablet Take 20 mg by mouth daily as needed for fluid or edema.   prn at unknown   gabapentin (NEURONTIN) 100 MG capsule Take 200 mg by mouth 3 (three) times daily.   02/25/2023   hydrOXYzine (ATARAX) 25 MG tablet Take 1 tablet by mouth 2 (two) times daily.   02/25/2023   insulin lispro (HUMALOG) 100 UNIT/ML injection Inject 7-19 Units into the skin 3 (three) times daily before meals. Blood Glucose level: 140-199 - 7 units, 200-250 - 9 units, 251-299 - 13 units,  300-349 - 17 units,  350 or above 19 units.   02/25/2023   LANTUS SOLOSTAR 100 UNIT/ML Solostar Pen Inject 36 Units into the skin at bedtime.   02/24/2023   magnesium oxide (MAG-OX) 400 MG tablet Take 800 mg by mouth 3 (three) times daily.   02/25/2023   metoprolol tartrate (LOPRESSOR) 50 MG tablet Take 1 tablet by mouth 2 (two) times daily.   02/25/2023   Multiple Vitamin (MULTIVITAMIN) tablet Take 1 tablet by mouth daily. Women's Daily Multivitamin   02/25/2023   pantoprazole (PROTONIX) 40 MG tablet Take 1 tablet (40 mg total) by mouth daily.   02/25/2023   prednisoLONE acetate (PRED FORTE) 1 % ophthalmic suspension Place 1 drop into both eyes at bedtime.   02/24/2023   rosuvastatin (CRESTOR) 10 MG tablet Take 10 mg by mouth daily.   02/24/2023   traZODone (DESYREL) 50 MG tablet  Take 50 mg by mouth at bedtime.   prn at unknown   vitamin B-12 (CYANOCOBALAMIN) 1000 MCG tablet Take 1,000 mcg by mouth daily.   02/24/2023   blood glucose meter kit and supplies KIT Dispense based on patient and insurance preference. Use up to four times daily as directed. (FOR ICD-9 250.00, 250.01). For QAC - HS accuchecks. 1 each 1    diltiazem (CARDIZEM CD) 180 MG 24 hr capsule Take 1 capsule (180 mg total) by mouth daily.      ferrous sulfate 325 (65 FE) MG tablet Take 325 mg by mouth daily with breakfast. (Patient not taking: Reported on 02/25/2023)  Not Taking   glucose blood (FREESTYLE LITE) test strip For glucose testing every before meals at bedtime. Diagnosis E 11.65  Can substitute to any accepted brand 100 each 0    Insulin Syringe-Needle U-100 25G X 1" 1 ML MISC For 4 times a day insulin SQ, 1 month supply. Diagnosis E11.65 30 each 0    PARoxetine (PAXIL) 10 MG tablet Take 10 mg by mouth daily.      Social History   Socioeconomic History   Marital status: Widowed    Spouse name: Not on file   Number of children: Not on file   Years of education: Not on file   Highest education level: Not on file  Occupational History   Not on file  Tobacco Use   Smoking status: Never   Smokeless tobacco: Never  Vaping Use   Vaping status: Never Used  Substance and Sexual Activity   Alcohol use: Never   Drug use: Never   Sexual activity: Not Currently  Other Topics Concern   Not on file  Social History Narrative   Lives alone, has support from son who is a Optician, dispensing and daughter in Social worker. Will be with mother when she has surgery.   Social Determinants of Health   Financial Resource Strain: Not on file  Food Insecurity: No Food Insecurity (02/25/2023)   Hunger Vital Sign    Worried About Running Out of Food in the Last Year: Never true    Ran Out of Food in the Last Year: Never true  Transportation Needs: No Transportation Needs (02/25/2023)   PRAPARE - Scientist, research (physical sciences) (Medical): No    Lack of Transportation (Non-Medical): No  Physical Activity: Not on file  Stress: Not on file  Social Connections: Not on file  Intimate Partner Violence: Not At Risk (02/25/2023)   Humiliation, Afraid, Rape, and Kick questionnaire    Fear of Current or Ex-Partner: No    Emotionally Abused: No    Physically Abused: No    Sexually Abused: No    Family History  Problem Relation Age of Onset   Breast cancer Paternal Aunt      Vitals:   02/28/23 0802 02/28/23 0825 02/28/23 1110 02/28/23 1203  BP: (!) 128/91 114/76 123/76   Pulse: (!) 103 (!) 109 98   Resp: 20  20   Temp: 97.6 F (36.4 C)   98.7 F (37.1 C)  TempSrc: Oral     SpO2: 96%  93%   Weight: 76.4 kg       PHYSICAL EXAM General: Well-appearing, well nourished, in no acute distress. HEENT: Normocephalic and atraumatic. Neck: No JVD.  Lungs: Normal respiratory effort on room air. Clear bilaterally to auscultation. No wheezes, crackles, rhonchi.  Heart: Irregularly irregular, controlled rate. Normal S1 and S2 without gallops or murmurs.  Abdomen: Non-distended appearing.  Msk: Normal strength and tone for age. Extremities: Warm and well perfused. No clubbing, cyanosis.  No edema.  Neuro: Alert and oriented X 3. Psych: Answers questions appropriately.   Labs: Basic Metabolic Panel: Recent Labs    02/26/23 0629 02/28/23 0327  NA 136 134*  K 3.5 3.4*  CL 98 93*  CO2 27 29  GLUCOSE 150* 292*  BUN 29* 44*  CREATININE 1.09* 1.43*  CALCIUM 8.9 9.2  MG 2.0 2.4   Liver Function Tests: No results for input(s): "AST", "ALT", "ALKPHOS", "BILITOT", "PROT", "ALBUMIN" in the last 72 hours. No results for input(s): "LIPASE", "AMYLASE" in the last  72 hours. CBC: Recent Labs    02/26/23 0629 02/28/23 0327  WBC 8.3 8.0  HGB 13.1 14.2  HCT 39.7 42.9  MCV 90.2 88.6  PLT 247 266   Cardiac Enzymes: No results for input(s): "CKTOTAL", "CKMB", "CKMBINDEX", "TROPONINIHS" in the last 72  hours.  BNP: No results for input(s): "BNP" in the last 72 hours. D-Dimer: No results for input(s): "DDIMER" in the last 72 hours. Hemoglobin A1C: No results for input(s): "HGBA1C" in the last 72 hours. Fasting Lipid Panel: No results for input(s): "CHOL", "HDL", "LDLCALC", "TRIG", "CHOLHDL", "LDLDIRECT" in the last 72 hours. Thyroid Function Tests: No results for input(s): "TSH", "T4TOTAL", "T3FREE", "THYROIDAB" in the last 72 hours.  Invalid input(s): "FREET3" Anemia Panel: No results for input(s): "VITAMINB12", "FOLATE", "FERRITIN", "TIBC", "IRON", "RETICCTPCT" in the last 72 hours.   Radiology: CT Angio Chest PE W/Cm &/Or Wo Cm  Result Date: 02/25/2023 CLINICAL DATA:  Pulmonary embolism (PE) suspected, high prob. Weakness. Shortness of breath. EXAM: CT ANGIOGRAPHY CHEST WITH CONTRAST TECHNIQUE: Multidetector CT imaging of the chest was performed using the standard protocol during bolus administration of intravenous contrast. Multiplanar CT image reconstructions and MIPs were obtained to evaluate the vascular anatomy. RADIATION DOSE REDUCTION: This exam was performed according to the departmental dose-optimization program which includes automated exposure control, adjustment of the mA and/or kV according to patient size and/or use of iterative reconstruction technique. CONTRAST:  75mL OMNIPAQUE IOHEXOL 350 MG/ML SOLN COMPARISON:  CT angiography chest from 01/30/2023. FINDINGS: Cardiovascular: No evidence of embolism to the proximal subsegmental pulmonary artery level. Mild cardiomegaly. No pericardial effusion. No aortic aneurysm. There are coronary artery calcifications, in keeping with coronary artery disease. There are also mild-to-moderate peripheral atherosclerotic vascular calcifications of thoracic aorta and its major branches. There is dilation of the main pulmonary trunk measuring up to 3.3 cm, which is nonspecific but can be seen with pulmonary artery hypertension. Mediastinum/Nodes:  Visualized thyroid gland appears grossly unremarkable. No solid / cystic mediastinal masses. The esophagus is nondistended precluding optimal assessment. There are few mildly prominent mediastinal and hilar lymph nodes, which do not meet the size criteria for lymphadenopathy and appear grossly similar to the prior study, favoring benign etiology. No axillary lymphadenopathy by size criteria. Lungs/Pleura: The central tracheo-bronchial tree is patent. There is mild, smooth, circumferential thickening of the segmental and subsegmental bronchial walls, throughout bilateral lungs, which is nonspecific. Findings are most commonly seen with pulmonary edema, bronchitis or reactive airway disease, such as asthma. Redemonstration of filling defects in the bilateral lower lobe subsegmental bronchi, likely due to mucus/secretions or aspiration. There are patchy areas of smooth interlobular septal thickening throughout bilateral lungs and bilateral trace pleural effusions. There are patchy areas of linear, plate-like atelectasis and/or scarring throughout bilateral lungs. No mass or consolidation. No pneumothorax. No suspicious lung nodules. Upper Abdomen: Visualized upper abdominal viscera within normal limits. Musculoskeletal: The visualized soft tissues of the chest wall are grossly unremarkable. No suspicious osseous lesions. There are mild multilevel degenerative changes in the visualized spine. Review of the MIP images confirms the above findings. IMPRESSION: *No evidence of pulmonary embolism to the proximal subsegmental pulmonary artery level. *There is mild smooth interlobular septal thickening throughout bilateral lungs with bilateral trace pleural effusions favoring congestive heart failure/pulmonary edema. *Multiple other nonacute observations, as described above. Electronically Signed   By: Jules Schick M.D.   On: 02/25/2023 14:34   DG Chest Port 1 View  Result Date: 02/25/2023 CLINICAL DATA:  Shortness of  breath. EXAM: PORTABLE  CHEST 1 VIEW COMPARISON:  Sep 15, 2019. FINDINGS: Mild cardiomegaly is noted. Mild central pulmonary vascular congestion is noted. Minimal bibasilar pulmonary edema is noted. Status post left shoulder arthroplasty. IMPRESSION: Mild cardiomegaly with mild central pulmonary vascular congestion and minimal bibasilar pulmonary edema. Electronically Signed   By: Lupita Raider M.D.   On: 02/25/2023 13:22   ECHOCARDIOGRAM COMPLETE  Result Date: 01/31/2023    ECHOCARDIOGRAM REPORT   Patient Name:   LAVERN CRIMI Date of Exam: 01/30/2023 Medical Rec #:  [id number]       Height:       64.0 in Accession #:    [id number]      Weight:       184.0 lb Date of Birth:  [birth year]        BSA:          1.888 m Patient Age:    87 years        BP:           131/90 mmHg Patient Gender: F               HR:           133 bpm. Exam Location:  [facility] Procedure: 2D Echo, Cardiac Doppler and Color Doppler Indications:     R91.31 Abnormal EKG  History:         Patient has no prior history of Echocardiogram examinations.                  Signs/Symptoms:Dyspnea and Murmur; Risk Factors:Hypertension,                  Diabetes and Dyslipidemia. COVID-19. Pneumonia.  Sonographer:     Daphine Deutscher RDCS Referring Phys:  [id number] [facility] DAHAL Diagnosing Phys: Marcina Millard MD IMPRESSIONS  1. Left ventricular ejection fraction, by estimation, is 60 to 65%. The left ventricle has normal function. The left ventricle has no regional wall motion abnormalities. There is moderate left ventricular hypertrophy. Indeterminate diastolic filling due  to E-A fusion.  2. Right ventricular systolic function is normal. The right ventricular size is normal.  3. Left atrial size was moderately dilated.  4. Right atrial size was mildly dilated.  5. The mitral valve is normal in structure. Mild mitral valve regurgitation. No evidence of mitral stenosis.  6. The aortic valve is normal in structure. Aortic valve regurgitation is not  visualized. Mild aortic valve stenosis.  7. The inferior vena cava is normal in size with greater than 50% respiratory variability, suggesting right atrial pressure of 3 mmHg. FINDINGS  Left Ventricle: Left ventricular ejection fraction, by estimation, is 60 to 65%. The left ventricle has normal function. The left ventricle has no regional wall motion abnormalities. The left ventricular internal cavity size was normal in size. There is  moderate left ventricular hypertrophy. Indeterminate diastolic filling due to E-A fusion. Right Ventricle: The right ventricular size is normal. No increase in right ventricular wall thickness. Right ventricular systolic function is normal. Left Atrium: Left atrial size was moderately dilated. Right Atrium: Right atrial size was mildly dilated. Pericardium: There is no evidence of pericardial effusion. Mitral Valve: The mitral valve is normal in structure. Mild mitral valve regurgitation. No evidence of mitral valve stenosis. Tricuspid Valve: The tricuspid valve is normal in structure. Tricuspid valve regurgitation is mild . No evidence of tricuspid stenosis. Aortic Valve: The aortic valve is normal in structure. Aortic valve regurgitation is not visualized. Mild aortic stenosis is present. Aortic valve  mean gradient measures 10.0 mmHg. Aortic valve peak gradient measures 17.6 mmHg. Aortic valve area, by VTI measures 1.82 cm. Pulmonic Valve: The pulmonic valve was normal in structure. Pulmonic valve regurgitation is not visualized. No evidence of pulmonic stenosis. Aorta: The aortic root is normal in size and structure. Venous: The inferior vena cava is normal in size with greater than 50% respiratory variability, suggesting right atrial pressure of 3 mmHg. IAS/Shunts: No atrial level shunt detected by color flow Doppler.  LEFT VENTRICLE PLAX 2D LVIDd:         3.50 cm LVIDs:         2.50 cm LV PW:         1.20 cm LV IVS:        1.20 cm LVOT diam:     2.00 cm LV SV:         58 LV SV  Index:   31 LVOT Area:     3.14 cm  RIGHT VENTRICLE             IVC RV Basal diam:  3.40 cm     IVC diam: 2.10 cm RV S prime:     12.90 cm/s TAPSE (M-mode): 1.7 cm LEFT ATRIUM             Index        RIGHT ATRIUM           Index LA diam:        4.60 cm 2.44 cm/m   RA Area:     19.40 cm LA Vol (A2C):   58.8 ml 31.14 ml/m  RA Volume:   61.70 ml  32.68 ml/m LA Vol (A4C):   66.3 ml 35.11 ml/m LA Biplane Vol: 62.5 ml 33.10 ml/m  AORTIC VALVE AV Area (Vmax):    1.26 cm AV Area (Vmean):   1.34 cm AV Area (VTI):     1.82 cm AV Vmax:           209.80 cm/s AV Vmean:          146.400 cm/s AV VTI:            0.317 m AV Peak Grad:      17.6 mmHg AV Mean Grad:      10.0 mmHg LVOT Vmax:         84.30 cm/s LVOT Vmean:        62.550 cm/s LVOT VTI:          0.184 m LVOT/AV VTI ratio: 0.58  AORTA Ao Root diam: 2.80 cm Ao Asc diam:  3.40 cm MV E velocity: 112.00 cm/s  TRICUSPID VALVE                             TR Peak grad:   25.4 mmHg                             TR Vmax:        252.00 cm/s                              SHUNTS                             Systemic VTI:  0.18 m  Systemic Diam: 2.00 cm Marcina Millard MD Electronically signed by Marcina Millard MD Signature Date/Time: 01/31/2023/1:38:40 PM    Final    CT Angio Chest PE W and/or Wo Contrast  Result Date: 01/30/2023 CLINICAL DATA:  Pulmonary embolism (PE) suspected, high prob. Dyspnea. Shortness of breath. EXAM: CT ANGIOGRAPHY CHEST WITH CONTRAST TECHNIQUE: Multidetector CT imaging of the chest was performed using the standard protocol during bolus administration of intravenous contrast. Multiplanar CT image reconstructions and MIPs were obtained to evaluate the vascular anatomy. RADIATION DOSE REDUCTION: This exam was performed according to the departmental dose-optimization program which includes automated exposure control, adjustment of the mA and/or kV according to patient size and/or use of iterative reconstruction  technique. CONTRAST:  75mL OMNIPAQUE IOHEXOL 350 MG/ML SOLN COMPARISON:  None Available. FINDINGS: Cardiovascular: No evidence of embolism to the proximal subsegmental pulmonary artery level. Mild cardiomegaly. Physiological pericardial effusion. No aortic aneurysm. There are coronary artery calcifications, in keeping with coronary artery disease. There are also mild peripheral atherosclerotic vascular calcifications of thoracic aorta and its major branches. Mediastinum/Nodes: Visualized thyroid gland appears grossly unremarkable. No solid / cystic mediastinal masses. The esophagus is nondistended precluding optimal assessment. There are few mildly prominent mediastinal and hilar lymph nodes, which do not meet the size criteria for lymphadenopathy and though indeterminate most likely benign in etiology. No axillary lymphadenopathy by size criteria. Lungs/Pleura: The central tracheo-bronchial tree is patent. There is mild, smooth, circumferential thickening of the segmental and subsegmental bronchial walls, throughout bilateral lungs, which is nonspecific. Findings are most commonly seen with bronchitis or reactive airway disease, such as asthma. There also occlusive and nonocclusive filling defects mainly in the bilateral lower lobe subsegmental bronchi, likely due to mucous secretions or aspiration. There are patchy areas of linear, plate-like atelectasis and/or scarring throughout bilateral lungs. No mass or consolidation. No pleural effusion or pneumothorax. No suspicious lung nodules. Upper Abdomen: There is reflux of contrast in the intrahepatic IVC and central intrahepatic veins, favoring right heart strain/failure. Visualized upper abdominal viscera within normal limits. Musculoskeletal: The visualized soft tissues of the chest wall are grossly unremarkable. No suspicious osseous lesions. There are mild multilevel degenerative changes in the visualized spine. Note is made of left shoulder arthroplasty. Review  of the MIP images confirms the above findings. IMPRESSION: 1. No evidence of pulmonary embolism to the proximal subsegmental pulmonary artery level. 2. Mild, smooth, circumferential thickening of the segmental and subsegmental bronchial walls, throughout bilateral lungs, which is nonspecific. Findings are most commonly seen with bronchitis or reactive airway disease, such as asthma. 3. Occlusive and nonocclusive filling defects mainly in the bilateral lower lobe subsegmental bronchi, likely due to mucous secretions or aspiration. 4. Reflux of contrast in the intrahepatic IVC and central intrahepatic veins, favoring right heart strain/failure. 5. Multiple other nonacute observations, as described above. Aortic Atherosclerosis (ICD10-I70.0). Electronically Signed   By: Jules Schick M.D.   On: 01/30/2023 11:17    ECHO 01/2023: 1. Left ventricular ejection fraction, by estimation, is 60 to 65%. The left ventricle has normal function. The left ventricle has no regional wall motion abnormalities. There is moderate left ventricular hypertrophy. Indeterminate diastolic filling due   to E-A fusion.   2. Right ventricular systolic function is normal. The right ventricular size is normal.   3. Left atrial size was moderately dilated.   4. Right atrial size was mildly dilated.   5. The mitral valve is normal in structure. Mild mitral valve regurgitation. No evidence of mitral stenosis.   6.  The aortic valve is normal in structure. Aortic valve regurgitation is  not visualized. Mild aortic valve stenosis.   7. The inferior vena cava is normal in size with greater than 50%  respiratory variability, suggesting right atrial pressure of 3 mmHg.   TELEMETRY reviewed by me 02/28/2023: atrial fibrillation rate 90-100s  EKG reviewed by me: AF RVR rate 116 bpm  Data reviewed by me 02/28/2023: last 24h vitals tele labs imaging I/O ED provider note, admission H&P, hospitalist progress note  Principal Problem:   Atrial  fibrillation with RVR (HCC) Active Problems:   Chronic kidney disease, stage 3a (HCC)   DM (diabetes mellitus) (HCC)   HTN (hypertension)   Acute hypoxic respiratory failure (HCC)   Anxiety and depression   Acute heart failure with preserved ejection fraction (HFpEF) (HCC)   Generalized weakness    ASSESSMENT AND PLAN:  Samantha Freeman is a 87 y.o. female  with a past medical history of recently diagnosed atrial fibrillation, mild aortic stenosis, bradycardia, type 2 diabetes, hypercholesterolemia who presented to the ED on 02/25/2023 for generalized weakness and fatigue. Found to be in AF RVR in the ED. Cardiology was consulted for further evaluation.   # Atrial fibrillation RVR # Recently diagnosed atrial fibrillation Patient presented with worsening weakness and fatigue. Found to be in AF RVR in the ED. Was diagnosed with AF during admission 10/9-10/14. Initially started on eliquis but this has been held due to epistaxis requiring packing.  -S/p IV mag load and infusion. -Discontinue IV diltiazem.  Increase PO diltiazem to 60 mg q6 hours. Continue metoprolol 25 mg twice daily.   # Acute on chronic HFpEF (EF 60-65% in 01/2023) # Acute hypoxic respiratory failure Patient with SOB prior to admission. BNP 517. S/p IV diuresis. Poorly documented output but weight down nearly 10 kg since 11/4. SOB improved overall and appears euvolemic. -Continue farxiga.    This patient's plan of care was discussed and created with Dr. Melton Alar and she is in agreement.  Signed: Gale Journey, PA-C  02/28/2023, 2:34 PM [facility]

## 2023-02-28 NOTE — Plan of Care (Signed)

## 2023-02-28 NOTE — Progress Notes (Addendum)
PROGRESS NOTE    Samantha Freeman  VOZ:[id number] DOB: 09-Feb-1936 DOA: 02/25/2023 PCP: Samantha Arbour, MD  [id number]  LOS: 3 days   Brief hospital course:   Assessment & Plan: 87 y.o. female with medical history significant of recently diagnosed atrial fibrillation not on [facility], recently diagnosed CHF, type 2 diabetes, hypertension, hyperlipidemia, CKD stage IIIa, depression/anxiety, who presents to the ED due to atrial fibrillation.   Samantha Freeman states she has been experiencing generalized weakness, fatigue, DOE and orthopnea for the last few weeks, but her symptoms significantly worsened over the last 1-2 days.  She notes she occasionally experiences shortness of breath at rest but this comes and goes; when she experiences shortness of breath, it is sudden onset.  She endorses some chest rightness when shortness of breath starts but denies any at this time.  She denies any palpitations, nausea, vomiting, fever, chills.  She notes a poor appetite but denies any abdominal distention, weight loss or acute weight gain.  Atrial fibrillation with RVR (HCC) Patient presenting with ongoing weakness, fatigue, DOB with acute worsening over the last 1-2 days.  Telemetry with atrial fibrillation with RVR.  Patient stopped anticoagulation after developing epistaxis requiring packing, and declined anticoagulation.   --was first dx during Oct hospitalization. --started on dilt gtt and oral dilt Plan: --cardio consult today --d/c dilt gtt --increase oral dilt to 60 mg q6h --cont Lopressor 25 mg BID --IV mag load and infusion   Acute hypoxic respiratory failure (HCC) Patient has mild pulmonary edema likely triggered by atrial fibrillation with RVR.  CTA without PE.  No signs of infectious process. --started IV lasix 40 daily Plan: --hold diuresis today due to Cr bump --Continue supplemental O2 to keep sats >=90%, wean as tolerated  Acute heart failure with preserved ejection fraction (HFpEF)  (HCC) Likely triggered by atrial fibrillation with RVR.  BNP is elevated at 512, higher than previous admission. --started IV lasix 40 daily Plan: --hold diuresis today due to Cr bump --cont farxiga   Generalized weakness Likely due to multiple new comorbidities including atrial fibrillation with RVR and physical deconditioning. PT and OT --SNF rehab   Anxiety and depression Continue home Xanax and Atarax   HTN (hypertension) - Hold home amlodipine and enalapril due to soft BP   DM (diabetes mellitus) (HCC) Hyperglycemia - A1c 7.9, poorly controlled --Hold home insulin products - Continue home Farxiga --increase glargine to 25u nightly --mealtime 4u TID - SSI, moderate   Chronic kidney disease, stage 3a (HCC) Renal function is currently improved compared to previous hospitalizations. --monitor Cr while diuesing  Hypokalemia --monitor and supplement PRN    DVT prophylaxis: Lovenox SQ Code Status: DNR  Family Communication:  Level of care: Telemetry Cardiac Dispo:   The patient is from: home Anticipated d/c is to: SNF rehab Anticipated d/c date is: 1-2 days   Subjective and Interval History:  Pt reported feeling better with improved appetite.   Objective: Vitals:   02/28/23 1110 02/28/23 1203 02/28/23 1606 02/28/23 1800  BP: 123/76  98/69   Pulse: 98  70   Resp: 20  20   Temp:  98.7 F (37.1 C) 97.7 F (36.5 C)   TempSrc:   Oral   SpO2: 93%  92% 95%  Weight:        Intake/Output Summary (Last 24 hours) at 02/28/2023 1905 Last data filed at 02/28/2023 1800 Gross per 24 hour  Intake 982.93 ml  Output --  Net 982.93 ml   [employer]  02/25/23 1456 02/27/23 0500 02/28/23 0802  Weight: 86.1 kg 86.2 kg 76.4 kg    Examination:   Constitutional: NAD, AAOx3 HEENT: conjunctivae and lids normal, EOMI CV: No cyanosis.   RESP: normal respiratory effort, on 2L Extremities: No effusions, edema in BLE SKIN: warm, dry Neuro: II - XII grossly intact.    Psych: Normal mood and affect.  Appropriate judgement and reason   Data Reviewed: I have personally reviewed labs and imaging studies  Time spent: 50 minutes  Samantha Priestly, MD [facility] If 7PM-7AM, please contact night-coverage 02/28/2023, 7:05 PM

## 2023-02-28 NOTE — Plan of Care (Signed)
  Problem: Clinical Measurements: Goal: Ability to maintain clinical measurements within normal limits will improve Outcome: Progressing   Problem: Clinical Measurements: Goal: Respiratory complications will improve Outcome: Progressing   Problem: Clinical Measurements: Goal: Cardiovascular complication will be avoided Outcome: Progressing   Problem: Activity: Goal: Risk for activity intolerance will decrease Outcome: Progressing   Problem: Pain Management: Goal: General experience of comfort will improve Outcome: Progressing   Problem: Safety: Goal: Ability to remain free from injury will improve Outcome: Progressing

## 2023-02-28 NOTE — TOC Progression Note (Addendum)
Transition of Care [facility]) - Progression Note    Patient Details  Name: Breyana Follansbee MRN: [id number] Date of Birth: 07-Oct-1935  Transition [facility]) CM/SW Contact  Margarito Liner, LCSW Phone Number: 02/28/2023, 11:33 AM  Clinical Narrative:   CSW reviewed bed offers and gave CMS list for review. She has accepted the bed offer from [facility]. CSW sent secure chat to MD to see when she might be ready for discharge. This facility does not accept patients over the weekend.  1:36 pm: Per MD, the earliest patient will be ready will be tomorrow. If not, will plan for discharge Monday if stable. CSW started [place].  Expected Discharge Plan: Skilled Nursing Facility Barriers to Discharge: Continued Medical Work up  Expected Discharge Plan and Services     Post Acute Care Choice: Skilled Nursing Facility Living arrangements for the past 2 months: Apartment                                       Social Determinants of Health (SDOH) Interventions SDOH Screenings   Food Insecurity: No Food Insecurity (02/25/2023)  Housing: Low Risk  (02/25/2023)  Transportation Needs: No Transportation Needs (02/25/2023)  Utilities: Not At Risk (02/25/2023)  Tobacco Use: Low Risk  (02/25/2023)   Received from [facility]    Readmission Risk Interventions     No data to display

## 2023-03-01 DIAGNOSIS — I4891 Unspecified atrial fibrillation: Secondary | ICD-10-CM | POA: Diagnosis not present

## 2023-03-01 LAB — BASIC METABOLIC PANEL
Anion gap: 12 (ref 5–15)
BUN: 46 mg/dL — ABNORMAL HIGH (ref 8–23)
CO2: 29 mmol/L (ref 22–32)
Calcium: 9.2 mg/dL (ref 8.9–10.3)
Chloride: 94 mmol/L — ABNORMAL LOW (ref 98–111)
Creatinine, Ser: 1.26 mg/dL — ABNORMAL HIGH (ref 0.44–1.00)
GFR, Estimated: 42 mL/min — ABNORMAL LOW (ref >60)
Glucose, Bld: 213 mg/dL — ABNORMAL HIGH (ref 70–99)
Potassium: 3.8 mmol/L (ref 3.5–5.1)
Sodium: 135 mmol/L (ref 135–145)

## 2023-03-01 LAB — CBC
HCT: 44.2 % (ref 36.0–46.0)
Hemoglobin: 14.6 g/dL (ref 12.0–15.0)
MCH: 29.2 pg (ref 26.0–34.0)
MCHC: 33 g/dL (ref 30.0–36.0)
MCV: 88.4 fL (ref 80.0–100.0)
Platelets: 264 K/uL (ref 150–400)
RBC: 5 MIL/uL (ref 3.87–5.11)
RDW: 15 % (ref 11.5–15.5)
WBC: 6.9 K/uL (ref 4.0–10.5)
nRBC: 0 % (ref 0.0–0.2)

## 2023-03-01 LAB — GLUCOSE, CAPILLARY
Glucose-Capillary: 219 mg/dL — ABNORMAL HIGH (ref 70–99)
Glucose-Capillary: 282 mg/dL — ABNORMAL HIGH (ref 70–99)
Glucose-Capillary: 264 mg/dL — ABNORMAL HIGH (ref 70–99)
Glucose-Capillary: 147 mg/dL — ABNORMAL HIGH (ref 70–99)

## 2023-03-01 LAB — MAGNESIUM: Magnesium: 3 mg/dL — ABNORMAL HIGH (ref 1.7–2.4)

## 2023-03-01 MED ORDER — INSULIN GLARGINE-YFGN 100 UNIT/ML ~~LOC~~ SOLN
30.0000 [IU] | Freq: Every day | SUBCUTANEOUS | Status: DC
  Administered 2023-03-01: 30 [IU] via SUBCUTANEOUS
  Filled 2023-03-01: qty 0.3

## 2023-03-01 MED ORDER — DILTIAZEM HCL ER COATED BEADS 120 MG PO CP24
240.0000 mg | ORAL_CAPSULE | Freq: Every day | ORAL | Status: DC
  Administered 2023-03-01 – 2023-03-05 (×4): 240 mg via ORAL
  Filled 2023-03-01 (×5): qty 2

## 2023-03-01 MED ORDER — ASPIRIN 81 MG PO TBEC
81.0000 mg | DELAYED_RELEASE_TABLET | Freq: Every day | ORAL | Status: DC
  Administered 2023-03-01 – 2023-03-12 (×12): 81 mg via ORAL
  Filled 2023-03-01 (×12): qty 1

## 2023-03-01 NOTE — Progress Notes (Addendum)
PROGRESS NOTE    Samantha Freeman  QMV:[id number] DOB: 10/06/1935 DOA: 02/25/2023 PCP: Marguarite Arbour, MD  [id number]  LOS: 4 days   Brief hospital course:   Assessment & Plan: 87 y.o. female with medical history significant of recently diagnosed atrial fibrillation not on [facility], recently diagnosed CHF, type 2 diabetes, hypertension, hyperlipidemia, CKD stage IIIa, depression/anxiety, who presents to the ED due to atrial fibrillation.   Samantha Freeman states she has been experiencing generalized weakness, fatigue, DOE and orthopnea for the last few weeks, but her symptoms significantly worsened over the last 1-2 days.  She notes she occasionally experiences shortness of breath at rest but this comes and goes; when she experiences shortness of breath, it is sudden onset.  She endorses some chest rightness when shortness of breath starts but denies any at this time.  She denies any palpitations, nausea, vomiting, fever, chills.  She notes a poor appetite but denies any abdominal distention, weight loss or acute weight gain.  Atrial fibrillation with RVR (HCC) Patient presenting with ongoing weakness, fatigue, DOB with acute worsening over the last 1-2 days.  Telemetry with atrial fibrillation with RVR.  Patient stopped anticoagulation after developing epistaxis requiring packing, and declined anticoagulation.   --was first dx during Oct hospitalization. --started on dilt gtt and oral dilt --cardiology consulted, s/p IV mag load Plan: --consolidated dilt to 240 mg daily --cont Lopressor 25 mg BID  Acute hypoxic respiratory failure (HCC) Patient has mild pulmonary edema likely triggered by atrial fibrillation with RVR.  CTA without PE.  No signs of infectious process. --received IV lasix 40 daily x 4  Plan: --hold diuresis due to Cr bump --Continue supplemental O2 to keep sats >=90%, wean as tolerated  Acute heart failure with preserved ejection fraction (HFpEF) (HCC) Likely triggered by  atrial fibrillation with RVR.  BNP is elevated at 512, higher than previous admission. --received IV lasix 40 daily x 4  Plan: --hold diuresis due to Cr bump --cont farxiga   Generalized weakness Likely due to multiple new comorbidities including atrial fibrillation with RVR and physical deconditioning. PT and OT --SNF rehab   Anxiety and depression Continue home Xanax and Atarax   HTN (hypertension) - Hold home amlodipine and enalapril due to soft BP   DM (diabetes mellitus) (HCC) Hyperglycemia - A1c 7.9, poorly controlled --Hold home insulin products - Continue home Farxiga --increase glargine to 30u nightly --mealtime 4u TID - SSI, moderate   AKI Chronic kidney disease, stage 3a (HCC) --Cr increased due to diuresis.  Diuretic held, Cr improved. --cont to hold diuretics  Hypokalemia --monitor and supplement PRN    DVT prophylaxis: Lovenox SQ Code Status: DNR  Family Communication:  Level of care: Telemetry Cardiac Dispo:   The patient is from: home Anticipated d/c is to: SNF rehab Anticipated d/c date is: whenever bed available   Subjective and Interval History:  Pt reported feeling better.   Objective: Vitals:   03/01/23 0438 03/01/23 0745 03/01/23 1149 03/01/23 1507  BP: 90/72 108/83 109/69 113/88  Pulse: 92 (!) 58 75 75  Resp:  18 20 20   Temp: 98.1 F (36.7 C) 97.8 F (36.6 C) 98.5 F (36.9 C) 97.9 F (36.6 C)  TempSrc: Oral  Oral Oral  SpO2: 90% 90% 94% 97%  Weight:  79.4 kg      Intake/Output Summary (Last 24 hours) at 03/01/2023 1816 Last data filed at 03/01/2023 1047 Gross per 24 hour  Intake 240 ml  Output --  Net 240 ml  Filed Weights   02/27/23 0500 02/28/23 0802 03/01/23 0745  Weight: 86.2 kg 76.4 kg 79.4 kg    Examination:   Constitutional: NAD, AAOx3 HEENT: conjunctivae and lids normal, EOMI CV: No cyanosis.   RESP: normal respiratory effort, 2L at 99% Extremities: No effusions, edema in BLE SKIN: warm, dry Neuro: II -  XII grossly intact.   Psych: Normal mood and affect.  Appropriate judgement and reason   Data Reviewed: I have personally reviewed labs and imaging studies  Time spent: 35 minutes  Darlin Priestly, MD [facility] If 7PM-7AM, please contact night-coverage 03/01/2023, 6:16 PM

## 2023-03-01 NOTE — Progress Notes (Addendum)
[facility] CLINIC CARDIOLOGY PROGRESS NOTE       Patient ID: Samantha Freeman MRN: [id number] DOB/AGE: August 20, 1935 87 y.o.  Admit date: 02/25/2023 Referring Physician Dr. Darlin Priestly Primary Physician Sparks, Duane Lope, MD  Primary Cardiologist Leanora Ivanoff, PA Reason for Consultation atrial fibrillation RVR  HPI: [name] is a 87 y.o. female  with a past medical history of recently diagnosed atrial fibrillation, mild aortic stenosis, bradycardia, type 2 diabetes, hypercholesterolemia who presented to the ED on 02/25/2023 for generalized weakness and fatigue. Found to be in AF RVR in the ED. Cardiology was consulted for further evaluation.   Interval history: -Patient feeling well this AM, sitting upright in bedside chair with friend present at bedside.  -HR much better controlled in the 70-80s. Denies palpitations, dizziness.  -BP remains stable.   Review of systems complete and found to be negative unless listed above    Past Medical History:  Diagnosis Date   Anemia    Anxiety    Aortic stenosis, mild    Arthritis    CKD (chronic kidney disease), stage III (HCC)    Complication of anesthesia    Propofol anaphylaxis   Cortical cataract    DDD (degenerative disc disease), lumbar    Depression    Dyspnea    GERD (gastroesophageal reflux disease)    Headache    Heart murmur    History of 2019 novel coronavirus disease (COVID-19) 12/01/2018   HLD (hyperlipidemia)    Hypertension    Pneumonia    Schatzki's ring    Seasonal allergies    T2DM (type 2 diabetes mellitus) (HCC)    Valvular insufficiency     Past Surgical History:  Procedure Laterality Date   ABDOMINAL HYSTERECTOMY     APPENDECTOMY     BICEPT TENODESIS Right 05/13/2018   Procedure: BICEPS TENODESIS;  Surgeon: Christena Flake, MD;  Location: [facility] ORS;  Service: Orthopedics;  Laterality: Right;   CARDIAC CATHETERIZATION     COLONOSCOPY     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   EYE SURGERY Right  09/2013   Corneal transplant   JOINT REPLACEMENT Right 2015   knee   REVERSE SHOULDER ARTHROPLASTY Left 10/13/2020   Procedure: REVERSE SHOULDER ARTHROPLASTY;  Surgeon: Christena Flake, MD;  Location: [facility] ORS;  Service: Orthopedics;  Laterality: Left;   SHOULDER ARTHROSCOPY WITH ROTATOR CUFF REPAIR Right 05/13/2018   Procedure: SHOULDER ARTHROSCOPY WITH DEBRIDEMENT, DECOMPRESSION AND ROTATOR CUFF REPAIR;  Surgeon: Christena Flake, MD;  Location: [facility] ORS;  Service: Orthopedics;  Laterality: Right;   TONSILLECTOMY      Medications Prior to Admission  Medication Sig Dispense Refill Last Dose   ALPRAZolam (XANAX) 0.5 MG tablet Take 0.5 mg by mouth at bedtime.   02/24/2023   aspirin 325 MG tablet Take 325 mg by mouth daily.   02/25/2023   Calcium Carb-Cholecalciferol (CALCIUM 600/VITAMIN D3 PO) Take 1 tablet by mouth daily.   02/25/2023   Coenzyme Q10 10 MG capsule Take 10 mg by mouth every morning.   02/25/2023   dapagliflozin propanediol (FARXIGA) 10 MG TABS tablet Take 10 mg by mouth daily.   02/25/2023   enalapril (VASOTEC) 20 MG tablet Take 20 mg by mouth 2 (two) times daily.   02/25/2023   fenofibrate (TRICOR) 48 MG tablet Take 1 tablet by mouth daily.   02/25/2023   furosemide (LASIX) 20 MG tablet Take 20 mg by mouth daily as needed for fluid or edema.   prn at unknown  gabapentin (NEURONTIN) 100 MG capsule Take 200 mg by mouth 3 (three) times daily.   02/25/2023   hydrOXYzine (ATARAX) 25 MG tablet Take 1 tablet by mouth 2 (two) times daily.   02/25/2023   insulin lispro (HUMALOG) 100 UNIT/ML injection Inject 7-19 Units into the skin 3 (three) times daily before meals. Blood Glucose level: 140-199 - 7 units, 200-250 - 9 units, 251-299 - 13 units,  300-349 - 17 units,  350 or above 19 units.   02/25/2023   LANTUS SOLOSTAR 100 UNIT/ML Solostar Pen Inject 36 Units into the skin at bedtime.   02/24/2023   magnesium oxide (MAG-OX) 400 MG tablet Take 800 mg by mouth 3 (three) times daily.   02/25/2023    metoprolol tartrate (LOPRESSOR) 50 MG tablet Take 1 tablet by mouth 2 (two) times daily.   02/25/2023   Multiple Vitamin (MULTIVITAMIN) tablet Take 1 tablet by mouth daily. Women's Daily Multivitamin   02/25/2023   pantoprazole (PROTONIX) 40 MG tablet Take 1 tablet (40 mg total) by mouth daily.   02/25/2023   prednisoLONE acetate (PRED FORTE) 1 % ophthalmic suspension Place 1 drop into both eyes at bedtime.   02/24/2023   rosuvastatin (CRESTOR) 10 MG tablet Take 10 mg by mouth daily.   02/24/2023   traZODone (DESYREL) 50 MG tablet Take 50 mg by mouth at bedtime.   prn at unknown   vitamin B-12 (CYANOCOBALAMIN) 1000 MCG tablet Take 1,000 mcg by mouth daily.   02/24/2023   blood glucose meter kit and supplies KIT Dispense based on patient and insurance preference. Use up to four times daily as directed. (FOR ICD-9 250.00, 250.01). For QAC - HS accuchecks. 1 each 1    diltiazem (CARDIZEM CD) 180 MG 24 hr capsule Take 1 capsule (180 mg total) by mouth daily.      ferrous sulfate 325 (65 FE) MG tablet Take 325 mg by mouth daily with breakfast. (Patient not taking: Reported on 02/25/2023)   Not Taking   glucose blood (FREESTYLE LITE) test strip For glucose testing every before meals at bedtime. Diagnosis E 11.65  Can substitute to any accepted brand 100 each 0    Insulin Syringe-Needle U-100 25G X 1" 1 ML MISC For 4 times a day insulin SQ, 1 month supply. Diagnosis E11.65 30 each 0    PARoxetine (PAXIL) 10 MG tablet Take 10 mg by mouth daily.      Social History   Socioeconomic History   Marital status: Widowed    Spouse name: Not on file   Number of children: Not on file   Years of education: Not on file   Highest education level: Not on file  Occupational History   Not on file  Tobacco Use   Smoking status: Never   Smokeless tobacco: Never  Vaping Use   Vaping status: Never Used  Substance and Sexual Activity   Alcohol use: Never   Drug use: Never   Sexual activity: Not Currently  Other  Topics Concern   Not on file  Social History Narrative   Lives alone, has support from son who is a Optician, dispensing and daughter in Social worker. Will be with mother when she has surgery.   Social Determinants of Health   Financial Resource Strain: Not on file  Food Insecurity: No Food Insecurity (02/25/2023)   Hunger Vital Sign    Worried About Running Out of Food in the Last Year: Never true    Ran Out of Food in the Last Year:  Never true  Transportation Needs: No Transportation Needs (02/25/2023)   PRAPARE - Administrator, Civil Service (Medical): No    Lack of Transportation (Non-Medical): No  Physical Activity: Not on file  Stress: Not on file  Social Connections: Not on file  Intimate Partner Violence: Not At Risk (02/25/2023)   Humiliation, Afraid, Rape, and Kick questionnaire    Fear of Current or Ex-Partner: No    Emotionally Abused: No    Physically Abused: No    Sexually Abused: No    Family History  Problem Relation Age of Onset   Breast cancer Paternal Aunt      Vitals:   02/28/23 2124 03/01/23 0051 03/01/23 0438 03/01/23 0745  BP: 108/71 102/75 90/72 108/83  Pulse: (!) 107 (!) 101 92 (!) 58  Resp:  17  18  Temp: 98 F (36.7 C) 98.3 F (36.8 C) 98.1 F (36.7 C) 97.8 F (36.6 C)  TempSrc: Oral Oral Oral   SpO2:  92% 90% 90%  Weight:    79.4 kg    PHYSICAL EXAM General: Well-appearing, well nourished, in no acute distress sitting upright in bedside chair. HEENT: Normocephalic and atraumatic. Neck: No JVD.  Lungs: Normal respiratory effort on room air. Clear bilaterally to auscultation. No wheezes, crackles, rhonchi.  Heart: Irregularly irregular, controlled rate. Normal S1 and S2 without gallops or murmurs.  Abdomen: Non-distended appearing.  Msk: Normal strength and tone for age. Extremities: Warm and well perfused. No clubbing, cyanosis.  No edema.  Neuro: Alert and oriented X 3. Psych: Answers questions appropriately.   Labs: Basic Metabolic  Panel: Recent Labs    02/28/23 0327 03/01/23 0449  NA 134* 135  K 3.4* 3.8  CL 93* 94*  CO2 29 29  GLUCOSE 292* 213*  BUN 44* 46*  CREATININE 1.43* 1.26*  CALCIUM 9.2 9.2  MG 2.4 3.0*   Liver Function Tests: No results for input(s): "AST", "ALT", "ALKPHOS", "BILITOT", "PROT", "ALBUMIN" in the last 72 hours. No results for input(s): "LIPASE", "AMYLASE" in the last 72 hours. CBC: Recent Labs    02/28/23 0327 03/01/23 0449  WBC 8.0 6.9  HGB 14.2 14.6  HCT 42.9 44.2  MCV 88.6 88.4  PLT 266 264   Cardiac Enzymes: No results for input(s): "CKTOTAL", "CKMB", "CKMBINDEX", "TROPONINIHS" in the last 72 hours.  BNP: No results for input(s): "BNP" in the last 72 hours. D-Dimer: No results for input(s): "DDIMER" in the last 72 hours. Hemoglobin A1C: No results for input(s): "HGBA1C" in the last 72 hours. Fasting Lipid Panel: No results for input(s): "CHOL", "HDL", "LDLCALC", "TRIG", "CHOLHDL", "LDLDIRECT" in the last 72 hours. Thyroid Function Tests: No results for input(s): "TSH", "T4TOTAL", "T3FREE", "THYROIDAB" in the last 72 hours.  Invalid input(s): "FREET3" Anemia Panel: No results for input(s): "VITAMINB12", "FOLATE", "FERRITIN", "TIBC", "IRON", "RETICCTPCT" in the last 72 hours.   Radiology: CT Angio Chest PE W/Cm &/Or Wo Cm  Result Date: 02/25/2023 CLINICAL DATA:  Pulmonary embolism (PE) suspected, high prob. Weakness. Shortness of breath. EXAM: CT ANGIOGRAPHY CHEST WITH CONTRAST TECHNIQUE: Multidetector CT imaging of the chest was performed using the standard protocol during bolus administration of intravenous contrast. Multiplanar CT image reconstructions and MIPs were obtained to evaluate the vascular anatomy. RADIATION DOSE REDUCTION: This exam was performed according to the departmental dose-optimization program which includes automated exposure control, adjustment of the mA and/or kV according to patient size and/or use of iterative reconstruction technique.  CONTRAST:  75mL OMNIPAQUE IOHEXOL 350 MG/ML SOLN COMPARISON:  CT angiography chest from 01/30/2023. FINDINGS: Cardiovascular: No evidence of embolism to the proximal subsegmental pulmonary artery level. Mild cardiomegaly. No pericardial effusion. No aortic aneurysm. There are coronary artery calcifications, in keeping with coronary artery disease. There are also mild-to-moderate peripheral atherosclerotic vascular calcifications of thoracic aorta and its major branches. There is dilation of the main pulmonary trunk measuring up to 3.3 cm, which is nonspecific but can be seen with pulmonary artery hypertension. Mediastinum/Nodes: Visualized thyroid gland appears grossly unremarkable. No solid / cystic mediastinal masses. The esophagus is nondistended precluding optimal assessment. There are few mildly prominent mediastinal and hilar lymph nodes, which do not meet the size criteria for lymphadenopathy and appear grossly similar to the prior study, favoring benign etiology. No axillary lymphadenopathy by size criteria. Lungs/Pleura: The central tracheo-bronchial tree is patent. There is mild, smooth, circumferential thickening of the segmental and subsegmental bronchial walls, throughout bilateral lungs, which is nonspecific. Findings are most commonly seen with pulmonary edema, bronchitis or reactive airway disease, such as asthma. Redemonstration of filling defects in the bilateral lower lobe subsegmental bronchi, likely due to mucus/secretions or aspiration. There are patchy areas of smooth interlobular septal thickening throughout bilateral lungs and bilateral trace pleural effusions. There are patchy areas of linear, plate-like atelectasis and/or scarring throughout bilateral lungs. No mass or consolidation. No pneumothorax. No suspicious lung nodules. Upper Abdomen: Visualized upper abdominal viscera within normal limits. Musculoskeletal: The visualized soft tissues of the chest wall are grossly unremarkable. No  suspicious osseous lesions. There are mild multilevel degenerative changes in the visualized spine. Review of the MIP images confirms the above findings. IMPRESSION: *No evidence of pulmonary embolism to the proximal subsegmental pulmonary artery level. *There is mild smooth interlobular septal thickening throughout bilateral lungs with bilateral trace pleural effusions favoring congestive heart failure/pulmonary edema. *Multiple other nonacute observations, as described above. Electronically Signed   By: Jules Schick M.D.   On: 02/25/2023 14:34   DG Chest Port 1 View  Result Date: 02/25/2023 CLINICAL DATA:  Shortness of breath. EXAM: PORTABLE CHEST 1 VIEW COMPARISON:  Sep 15, 2019. FINDINGS: Mild cardiomegaly is noted. Mild central pulmonary vascular congestion is noted. Minimal bibasilar pulmonary edema is noted. Status post left shoulder arthroplasty. IMPRESSION: Mild cardiomegaly with mild central pulmonary vascular congestion and minimal bibasilar pulmonary edema. Electronically Signed   By: Lupita Raider M.D.   On: 02/25/2023 13:22   ECHOCARDIOGRAM COMPLETE  Result Date: 01/31/2023    ECHOCARDIOGRAM REPORT   Patient Name:   TORRA BAUNE Date of Exam: 01/30/2023 Medical Rec #:  [id number]       Height:       64.0 in Accession #:    [id number]      Weight:       184.0 lb Date of Birth:  1935/05/27        BSA:          1.888 m Patient Age:    87 years        BP:           131/90 mmHg Patient Gender: F               HR:           133 bpm. Exam Location:  [facility] Procedure: 2D Echo, Cardiac Doppler and Color Doppler Indications:     R91.31 Abnormal EKG  History:         Patient has no prior history of Echocardiogram examinations.  Signs/Symptoms:Dyspnea and Murmur; Risk Factors:Hypertension,                  Diabetes and Dyslipidemia. COVID-19. Pneumonia.  Sonographer:     Daphine Deutscher RDCS Referring Phys:  [id number] [facility] Diagnosing Phys: Marcina Millard MD IMPRESSIONS   1. Left ventricular ejection fraction, by estimation, is 60 to 65%. The left ventricle has normal function. The left ventricle has no regional wall motion abnormalities. There is moderate left ventricular hypertrophy. Indeterminate diastolic filling due  to E-A fusion.  2. Right ventricular systolic function is normal. The right ventricular size is normal.  3. Left atrial size was moderately dilated.  4. Right atrial size was mildly dilated.  5. The mitral valve is normal in structure. Mild mitral valve regurgitation. No evidence of mitral stenosis.  6. The aortic valve is normal in structure. Aortic valve regurgitation is not visualized. Mild aortic valve stenosis.  7. The inferior vena cava is normal in size with greater than 50% respiratory variability, suggesting right atrial pressure of 3 mmHg. FINDINGS  Left Ventricle: Left ventricular ejection fraction, by estimation, is 60 to 65%. The left ventricle has normal function. The left ventricle has no regional wall motion abnormalities. The left ventricular internal cavity size was normal in size. There is  moderate left ventricular hypertrophy. Indeterminate diastolic filling due to E-A fusion. Right Ventricle: The right ventricular size is normal. No increase in right ventricular wall thickness. Right ventricular systolic function is normal. Left Atrium: Left atrial size was moderately dilated. Right Atrium: Right atrial size was mildly dilated. Pericardium: There is no evidence of pericardial effusion. Mitral Valve: The mitral valve is normal in structure. Mild mitral valve regurgitation. No evidence of mitral valve stenosis. Tricuspid Valve: The tricuspid valve is normal in structure. Tricuspid valve regurgitation is mild . No evidence of tricuspid stenosis. Aortic Valve: The aortic valve is normal in structure. Aortic valve regurgitation is not visualized. Mild aortic stenosis is present. Aortic valve mean gradient measures 10.0 mmHg. Aortic valve peak  gradient measures 17.6 mmHg. Aortic valve area, by VTI measures 1.82 cm. Pulmonic Valve: The pulmonic valve was normal in structure. Pulmonic valve regurgitation is not visualized. No evidence of pulmonic stenosis. Aorta: The aortic root is normal in size and structure. Venous: The inferior vena cava is normal in size with greater than 50% respiratory variability, suggesting right atrial pressure of 3 mmHg. IAS/Shunts: No atrial level shunt detected by color flow Doppler.  LEFT VENTRICLE PLAX 2D LVIDd:         3.50 cm LVIDs:         2.50 cm LV PW:         1.20 cm LV IVS:        1.20 cm LVOT diam:     2.00 cm LV SV:         58 LV SV Index:   31 LVOT Area:     3.14 cm  RIGHT VENTRICLE             IVC RV Basal diam:  3.40 cm     IVC diam: 2.10 cm RV S prime:     12.90 cm/s TAPSE (M-mode): 1.7 cm LEFT ATRIUM             Index        RIGHT ATRIUM           Index LA diam:        4.60 cm 2.44 cm/m   RA Area:  19.40 cm LA Vol (A2C):   58.8 ml 31.14 ml/m  RA Volume:   61.70 ml  32.68 ml/m LA Vol (A4C):   66.3 ml 35.11 ml/m LA Biplane Vol: 62.5 ml 33.10 ml/m  AORTIC VALVE AV Area (Vmax):    1.26 cm AV Area (Vmean):   1.34 cm AV Area (VTI):     1.82 cm AV Vmax:           209.80 cm/s AV Vmean:          146.400 cm/s AV VTI:            0.317 m AV Peak Grad:      17.6 mmHg AV Mean Grad:      10.0 mmHg LVOT Vmax:         84.30 cm/s LVOT Vmean:        62.550 cm/s LVOT VTI:          0.184 m LVOT/AV VTI ratio: 0.58  AORTA Ao Root diam: 2.80 cm Ao Asc diam:  3.40 cm MV E velocity: 112.00 cm/s  TRICUSPID VALVE                             TR Peak grad:   25.4 mmHg                             TR Vmax:        252.00 cm/s                              SHUNTS                             Systemic VTI:  0.18 m                             Systemic Diam: 2.00 cm Marcina Millard MD Electronically signed by Marcina Millard MD Signature Date/Time: 01/31/2023/1:38:40 PM    Final     ECHO 01/2023: 1. Left ventricular ejection  fraction, by estimation, is 60 to 65%. The left ventricle has normal function. The left ventricle has no regional wall motion abnormalities. There is moderate left ventricular hypertrophy. Indeterminate diastolic filling due   to E-A fusion.   2. Right ventricular systolic function is normal. The right ventricular size is normal.   3. Left atrial size was moderately dilated.   4. Right atrial size was mildly dilated.   5. The mitral valve is normal in structure. Mild mitral valve regurgitation. No evidence of mitral stenosis.   6. The aortic valve is normal in structure. Aortic valve regurgitation is  not visualized. Mild aortic valve stenosis.   7. The inferior vena cava is normal in size with greater than 50%  respiratory variability, suggesting right atrial pressure of 3 mmHg.   TELEMETRY reviewed by me 03/01/2023: atrial fibrillation rate 80-90s  EKG reviewed by me: AF RVR rate 116 bpm  Data reviewed by me 03/01/2023: last 24h vitals tele labs imaging I/O ED provider note, admission H&P, hospitalist progress note  Principal Problem:   Atrial fibrillation with RVR (HCC) Active Problems:   Chronic kidney disease, stage 3a (HCC)   DM (diabetes mellitus) (HCC)   HTN (hypertension)   Acute hypoxic respiratory failure (HCC)  Anxiety and depression   Acute heart failure with preserved ejection fraction (HFpEF) (HCC)   Generalized weakness    ASSESSMENT AND PLAN:  Hajar Fabrizio is a 87 y.o. female  with a past medical history of recently diagnosed atrial fibrillation, mild aortic stenosis, bradycardia, type 2 diabetes, hypercholesterolemia who presented to the ED on 02/25/2023 for generalized weakness and fatigue. Found to be in AF RVR in the ED. Cardiology was consulted for further evaluation.   # Atrial fibrillation RVR # Recently diagnosed atrial fibrillation Patient presented with worsening weakness and fatigue. Found to be in AF RVR in the ED. Was diagnosed with AF during admission  10/9-10/14. Initially started on eliquis but this has been held due to epistaxis requiring packing.  -S/p IV mag load and infusion.  -Consolidate diltiazem to 240 mg daily today. Continue metoprolol 25 mg twice daily.  -Home enalapril and amlodipine discontinued due to borderline BP on rate control medications.  -Patient declines resuming anticoagulation given recent nosebleeds. Educated on risks associated with atrial fibrillation and stroke.   # Acute on chronic HFpEF (EF 60-65% in 01/2023) # Acute hypoxic respiratory failure Patient with SOB prior to admission. BNP 517. S/p IV diuresis. Poorly documented output but weight down nearly 10 kg since 11/4. SOB improved overall and appears euvolemic. -Continue farxiga.  -Continue home lasix prn.   # Hyperlipidemia -Continue rosuvastatin 10 mg daily and aspirin 81 mg daily.   Ok for discharge today from a cardiac perspective.  Patient scheduled for follow-up in clinic with Dr. Darrold Junker on 11/19.  This patient's plan of care was discussed and created with Dr. Melton Alar and she is in agreement.  Signed: Gale Journey, PA-C  03/01/2023, 11:12 AM [facility] Cardiology

## 2023-03-01 NOTE — Progress Notes (Signed)
Physical Therapy Treatment Patient Details Name: Samantha Freeman MRN: [id number] DOB: 07/03/1935 Today's Date: 03/01/2023   History of Present Illness Samantha Freeman is a 87 y.o. female with medical history significant of recently diagnosed atrial fibrillation not on [facility], recently diagnosed CHF, type 2 diabetes, hypertension, hyperlipidemia, CKD stage IIIa, depression/anxiety, who presents to the ED on 02/25/23 due to atrial fibrillation.    PT Comments  Pt is received in recliner, she is agreeable to PT session. Pt performs transfers supA and amb CGA for safety. Pt able to amb approx 25 ft using RW CGA with occasional reports of R knee buckling during initial amb activity that improved with time; Pt reports knee buckles following extended stationary reclined position and takes a few moments to warm up. VSS monitored throughout with brief spike of HR to 138bpm but predominantly HR fluctuating between high 90s-115s bpm throughout amb activity and SpO2 >90% on 2L [place] with a questionable desat to mid-70s% due to poor pleth reading. Overall, Pt demonstrates steady improvements towards PT goals and would benefit from cont skilled PT to address above deficits and promote optimal return to PLOF.   If plan is discharge home, recommend the following: A little help with walking and/or transfers;A little help with bathing/dressing/bathroom;Assistance with cooking/housework;Assist for transportation   Can travel by private vehicle     Yes  Equipment Recommendations  None recommended by PT    Recommendations for Other Services       Precautions / Restrictions Precautions Precautions: Fall Restrictions Weight Bearing Restrictions: No     Mobility  Bed Mobility               General bed mobility comments: NT secondary to Pt in recliner pre/post session    Transfers Overall transfer level: Needs assistance Equipment used: Rolling walker (2 wheels) Transfers: Sit to/from Stand Sit to Stand:  Supervision           General transfer comment: Pt able to perform STS with RW from recliner supA for safety; no LOB or cuing for hand placement required    Ambulation/Gait Ambulation/Gait assistance: Contact guard assist Gait Distance (Feet): 25 Feet Assistive device: Rolling walker (2 wheels) Gait Pattern/deviations: Step-through pattern, Decreased step length - right, Decreased step length - left, Decreased stride length Gait velocity: Decreased     General Gait Details: Pt amb approx 25 ft using RW CGA for safety; notable decreased stride length throughout with occasional R knee buckling due to decreased activity per Pt's report; VSS monitored throughout with HR briefly spiking to 138bpm but fluctuating between high 90s-115s bpm predominantly with no reports of difficulty or SOB   Stairs             Wheelchair Mobility     Tilt Bed    Modified Rankin (Stroke Patients Only)       Balance Overall balance assessment: Modified Independent Sitting-balance support: Feet supported Sitting balance-Leahy Scale: Normal Sitting balance - Comments: Pt able to maintain seated balance at edge of seat during functional activities without LOB noted   Standing balance support: Bilateral upper extremity supported, During functional activity, Reliant on assistive device for balance Standing balance-Leahy Scale: Good Standing balance comment: Pt able to maintain standing balance with use of RW during functional activities with no LOB noted                            Cognition Arousal: Alert Behavior During Therapy: [facility] for tasks  assessed/performed Overall Cognitive Status: Within Functional Limits for tasks assessed                                 General Comments: Pleasant and motivated to work with PT        Exercises Other Exercises Other Exercises: BLE exercises: x10ea seated marches and x10ea LAQ    General Comments General comments (skin  integrity, edema, etc.): VSS monitored throughout session with fluctuations of HR and on 2L [place]; SpO2% maintained in 90s with brief moment of desat with questionable pleth reading      Pertinent Vitals/Pain Pain Assessment Pain Assessment: No/denies pain    Home Living                          Prior Function            PT Goals (current goals can now be found in the care plan section) Acute Rehab PT Goals Patient Stated Goal: to go home PT Goal Formulation: With patient Time For Goal Achievement: 03/12/23 Potential to Achieve Goals: Good Progress towards PT goals: Progressing toward goals    Frequency    Min 1X/week      PT Plan      Co-evaluation              AM-PAC PT "6 Clicks" Mobility   Outcome Measure  Help needed turning from your back to your side while in a flat bed without using bedrails?: None Help needed moving from lying on your back to sitting on the side of a flat bed without using bedrails?: None Help needed moving to and from a bed to a chair (including a wheelchair)?: A Little Help needed standing up from a chair using your arms (e.g., wheelchair or bedside chair)?: A Little Help needed to walk in hospital room?: A Little Help needed climbing 3-5 steps with a railing? : A Lot 6 Click Score: 19    End of Session Equipment Utilized During Treatment: Oxygen;Gait belt Activity Tolerance: Patient tolerated treatment well Patient left: in chair;with call bell/phone within reach Nurse Communication: Mobility status PT Visit Diagnosis: Muscle weakness (generalized) (M62.81);Difficulty in walking, not elsewhere classified (R26.2)     Time: 0865-7846 PT Time Calculation (min) (ACUTE ONLY): 15 min  Charges:                            Elmon Else, SPT    Karington Zarazua 03/01/2023, 11:31 AM

## 2023-03-01 NOTE — Plan of Care (Signed)
  Problem: Safety: Goal: Ability to remain free from injury will improve Outcome: Progressing   Problem: Elimination: Goal: Will not experience complications related to urinary retention Outcome: Progressing   Problem: Activity: Goal: Risk for activity intolerance will decrease Outcome: Progressing   Problem: Clinical Measurements: Goal: Ability to maintain clinical measurements within normal limits will improve Outcome: Progressing   Problem: Clinical Measurements: Goal: Respiratory complications will improve Outcome: Progressing   Problem: Clinical Measurements: Goal: Cardiovascular complication will be avoided Outcome: Progressing

## 2023-03-01 NOTE — TOC Progression Note (Addendum)
Transition of Care [facility]) - Progression Note    Patient Details  Name: Samantha Freeman MRN: [id number] Date of Birth: 07-31-1935  Transition of [facility]) CM/SW Contact  Margarito Liner, LCSW Phone Number: 03/01/2023, 9:20 AM  Clinical Narrative: SNF insurance authorization still pending.    10:17 am: [facility] will not have a bed until Monday. They do not accept patients over the weekend. MD is aware.  12:13 pm: Auth approved: [id number]. Valid 11/8-11/12. CSW left message for SNF admissions coordinator to notify.  Expected Discharge Plan: Skilled Nursing Facility Barriers to Discharge: Continued Medical Work up  Expected Discharge Plan and Services     Post Acute Care Choice: Skilled Nursing Facility Living arrangements for the past 2 months: Apartment                                       Social Determinants of Health (SDOH) Interventions SDOH Screenings   Food Insecurity: No Food Insecurity (02/25/2023)  Housing: Low Risk  (02/25/2023)  Transportation Needs: No Transportation Needs (02/25/2023)  Utilities: Not At Risk (02/25/2023)  Tobacco Use: Low Risk  (02/25/2023)   Received from [facility]    Readmission Risk Interventions     No data to display

## 2023-03-01 NOTE — Inpatient Diabetes Management (Signed)
Inpatient Diabetes Program Recommendations  AACE/ADA: New Consensus Statement on Inpatient Glycemic Control (2015)  Target Ranges:  Prepandial:   less than 140 mg/dL      Peak postprandial:   less than 180 mg/dL (1-2 hours)      Critically ill patients:  140 - 180 mg/dL    Latest Reference Range & Units 02/28/23 07:49 02/28/23 11:56 02/28/23 16:03 02/28/23 20:44  Glucose-Capillary 70 - 99 mg/dL 578 (H)  5 units Novolog  321 (H)  11 units Novolog  245 (H)  9 units Novolog  223 (H)    25 units Semglee  (H): Data is abnormally high  Latest Reference Range & Units 03/01/23 07:42  Glucose-Capillary 70 - 99 mg/dL 469 (H)  (H): Data is abnormally high    Home DM Meds: Lantus 36 units at bedtime     Humalog 7-19 units TID     Farxiga 10 mg daily    Current Orders: Semglee 25 units at bedtime     Novolog Moderate Correction Scale/ SSI (0-15 units) TID AC     Novolog 4 units TID with meals     Farxiga 10 mg daily    MD- Note Semglee increased last PM and Novolog 4 units meal coverage started yest at 5pm w/ Dinner  CBG 219 this AM  Please consider increasing Semglee further to 30 units at bedtime    --Will follow patient during hospitalization--  Ambrose Finland RN, MSN, CDCES Diabetes Coordinator Inpatient Glycemic Control Team Team Pager: [phone] (8a-5p)

## 2023-03-02 DIAGNOSIS — I4891 Unspecified atrial fibrillation: Secondary | ICD-10-CM | POA: Diagnosis not present

## 2023-03-02 DIAGNOSIS — I5031 Acute diastolic (congestive) heart failure: Secondary | ICD-10-CM | POA: Diagnosis not present

## 2023-03-02 DIAGNOSIS — R531 Weakness: Secondary | ICD-10-CM | POA: Diagnosis not present

## 2023-03-02 DIAGNOSIS — J9601 Acute respiratory failure with hypoxia: Secondary | ICD-10-CM | POA: Diagnosis not present

## 2023-03-02 DIAGNOSIS — F32A Depression, unspecified: Secondary | ICD-10-CM

## 2023-03-02 DIAGNOSIS — F419 Anxiety disorder, unspecified: Secondary | ICD-10-CM

## 2023-03-02 LAB — GLUCOSE, CAPILLARY
Glucose-Capillary: 260 mg/dL — ABNORMAL HIGH (ref 70–99)
Glucose-Capillary: 365 mg/dL — ABNORMAL HIGH (ref 70–99)
Glucose-Capillary: 297 mg/dL — ABNORMAL HIGH (ref 70–99)
Glucose-Capillary: 328 mg/dL — ABNORMAL HIGH (ref 70–99)

## 2023-03-02 LAB — CBC
HCT: 43.9 % (ref 36.0–46.0)
Hemoglobin: 14.4 g/dL (ref 12.0–15.0)
MCH: 29.6 pg (ref 26.0–34.0)
MCHC: 32.8 g/dL (ref 30.0–36.0)
MCV: 90.3 fL (ref 80.0–100.0)
Platelets: 267 10*3/uL (ref 150–400)
RBC: 4.86 MIL/uL (ref 3.87–5.11)
RDW: 14.7 % (ref 11.5–15.5)
WBC: 8.2 10*3/uL (ref 4.0–10.5)
nRBC: 0 % (ref 0.0–0.2)

## 2023-03-02 LAB — BASIC METABOLIC PANEL
Anion gap: 15 (ref 5–15)
BUN: 53 mg/dL — ABNORMAL HIGH (ref 8–23)
CO2: 25 mmol/L (ref 22–32)
Calcium: 9 mg/dL (ref 8.9–10.3)
Chloride: 94 mmol/L — ABNORMAL LOW (ref 98–111)
Creatinine, Ser: 1.56 mg/dL — ABNORMAL HIGH (ref 0.44–1.00)
GFR, Estimated: 32 mL/min — ABNORMAL LOW (ref >60)
Glucose, Bld: 310 mg/dL — ABNORMAL HIGH (ref 70–99)
Potassium: 3.8 mmol/L (ref 3.5–5.1)
Sodium: 134 mmol/L — ABNORMAL LOW (ref 135–145)

## 2023-03-02 LAB — MAGNESIUM: Magnesium: 2.3 mg/dL (ref 1.7–2.4)

## 2023-03-02 MED ORDER — INSULIN ASPART 100 UNIT/ML IJ SOLN
6.0000 [IU] | Freq: Three times a day (TID) | INTRAMUSCULAR | Status: DC
Start: 2023-03-02 — End: 2023-03-03
  Administered 2023-03-02 – 2023-03-03 (×3): 6 [IU] via SUBCUTANEOUS
  Filled 2023-03-02 (×3): qty 1

## 2023-03-02 MED ORDER — ENOXAPARIN SODIUM 30 MG/0.3ML IJ SOSY
30.0000 mg | PREFILLED_SYRINGE | INTRAMUSCULAR | Status: DC
  Administered 2023-03-02 – 2023-03-11 (×10): 30 mg via SUBCUTANEOUS
  Filled 2023-03-02 (×10): qty 0.3

## 2023-03-02 MED ORDER — INSULIN GLARGINE-YFGN 100 UNIT/ML ~~LOC~~ SOLN
35.0000 [IU] | Freq: Every day | SUBCUTANEOUS | Status: DC
  Administered 2023-03-02 – 2023-03-03 (×2): 35 [IU] via SUBCUTANEOUS
  Filled 2023-03-02 (×2): qty 0.35

## 2023-03-02 NOTE — Progress Notes (Signed)
PHARMACIST - PHYSICIAN COMMUNICATION  CONCERNING:  Enoxaparin (Lovenox) for DVT Prophylaxis    RECOMMENDATION: Patient was prescribed enoxaparin 40mg  q24 hours for VTE prophylaxis.   Filed Weights   02/27/23 0500 02/28/23 0802 03/01/23 0745  Weight: 86.2 kg (190 lb 0.6 oz) 76.4 kg (168 lb 6.4 oz) 79.4 kg (175 lb 1.6 oz)    Body mass index is 30.06 kg/m.  Estimated Creatinine Clearance: 26.9 mL/min (A) (by C-G formula based on SCr of 1.56 mg/dL (H)).  Patient is candidate for enoxaparin 30mg  every 24 hours based on CrCl <69ml/min or Weight <45kg  DESCRIPTION: Pharmacy has adjusted enoxaparin dose per [facility] policy.  Patient is now receiving enoxaparin 30 mg every 24 hours   Tressie Ellis 03/02/2023 7:16 AM

## 2023-03-02 NOTE — Progress Notes (Signed)
PROGRESS NOTE    Samantha Freeman  XBM:[id number] DOB: 1935/08/29 DOA: 02/25/2023 PCP: Samantha Arbour, MD  [id number]  LOS: 5 days   Brief hospital course: 87 y.o. female with medical history significant of recently diagnosed atrial fibrillation not on [facility], recently diagnosed CHF, type 2 diabetes, hypertension, hyperlipidemia, CKD stage IIIa, depression/anxiety, who presents to the ED due to atrial fibrillation.   [name] states she has been experiencing generalized weakness, fatigue, DOE and orthopnea for the last few weeks, but her symptoms significantly worsened over the last 1-2 days.  She notes she occasionally experiences shortness of breath at rest but this comes and goes; when she experiences shortness of breath, it is sudden onset.  She endorses some chest rightness when shortness of breath starts but denies any at this time.  She denies any palpitations, nausea, vomiting, fever, chills.  She notes a poor appetite but denies any abdominal distention, weight loss or acute weight gain.  11/9: Remained medically stable, awaiting SNF placement. Insurance authorization obtained for admission on Monday.  Assessment & Plan:  Atrial fibrillation with RVR [facility]) Patient presenting with ongoing weakness, fatigue, DOB with acute worsening over the last 1-2 days.  Telemetry with atrial fibrillation with RVR.  Patient stopped anticoagulation after developing epistaxis requiring packing, and declined anticoagulation.   --was first dx during Oct hospitalization. --started on dilt gtt and oral dilt --cardiology consulted, s/p IV mag load Plan: --consolidated dilt to 240 mg daily --cont Lopressor 25 mg BID  Acute hypoxic respiratory failure (HCC) Patient has mild pulmonary edema likely triggered by atrial fibrillation with RVR.  CTA without PE.  No signs of infectious process. --received IV lasix 40 daily x 4  Plan: --hold diuresis due to Cr bump --Continue supplemental O2 to keep sats  >=90%, wean as tolerated  Acute heart failure with preserved ejection fraction (HFpEF) (HCC) Likely triggered by atrial fibrillation with RVR.  BNP is elevated at 512, higher than previous admission. --received IV lasix 40 daily x 4  Plan: --hold diuresis due to Cr bump --cont farxiga   Generalized weakness Likely due to multiple new comorbidities including atrial fibrillation with RVR and physical deconditioning. PT and OT --SNF rehab   Anxiety and depression Continue home Xanax and Atarax   HTN (hypertension) - Hold home amlodipine and enalapril due to soft BP   DM (diabetes mellitus) (HCC) Hyperglycemia - A1c 7.9, poorly controlled, persistent hyperglycemia --Hold home insulin products - Continue home Farxiga --increase glargine to 35u nightly --mealtime increased to 6u TID - SSI, moderate   AKI Chronic kidney disease, stage 3a (HCC) --Cr increased due to diuresis.  Diuretic held, Cr still slowly increasing. --cont to hold diuretics -Encourage p.o. hydration  Hypokalemia --monitor and supplement PRN    DVT prophylaxis: Lovenox SQ Code Status: DNR  Family Communication: Called son with no response. Level of care: Telemetry Cardiac Dispo:   The patient is from: home Anticipated d/c is to: SNF rehab Anticipated d/c date is: whenever bed available   Subjective and Interval History:  Patient was sitting comfortably in chair when seen today.  Denies any chest pain or shortness of breath.   Objective: Vitals:   03/02/23 0352 03/02/23 0700 03/02/23 1019 03/02/23 1155  BP: 96/65  106/88   Pulse: (!) 110  (!) 52   Resp: 18     Temp: 98.1 F (36.7 C) 98.5 F (36.9 C)  97.7 F (36.5 C)  TempSrc: Oral Oral  Oral  SpO2: 91%  95%  Weight:        Intake/Output Summary (Last 24 hours) at 03/02/2023 1417 Last data filed at 03/02/2023 0400 Gross per 24 hour  Intake 600 ml  Output --  Net 600 ml   Filed Weights   02/27/23 0500 02/28/23 0802 03/01/23 0745   Weight: 86.2 kg 76.4 kg 79.4 kg    Examination:   General.  Frail elderly lady, in no acute distress. Pulmonary.  Lungs clear bilaterally, normal respiratory effort. CV.  Irregularly irregular, Abdomen.  Soft, nontender, nondistended, BS positive. CNS.  Alert and oriented .  No focal neurologic deficit. Extremities.  No edema, no cyanosis, pulses intact and symmetrical. Psychiatry.  Judgment and insight appears normal.   Data Reviewed: I have personally reviewed labs and imaging studies  Time spent: 40 minutes  Arnetha Courser, MD [facility] If 7PM-7AM, please contact night-coverage 03/02/2023, 2:17 PM

## 2023-03-02 NOTE — Plan of Care (Signed)

## 2023-03-03 DIAGNOSIS — I4891 Unspecified atrial fibrillation: Secondary | ICD-10-CM | POA: Diagnosis not present

## 2023-03-03 LAB — BASIC METABOLIC PANEL
Anion gap: 7 (ref 5–15)
BUN: 42 mg/dL — ABNORMAL HIGH (ref 8–23)
CO2: 27 mmol/L (ref 22–32)
Calcium: 9 mg/dL (ref 8.9–10.3)
Chloride: 98 mmol/L (ref 98–111)
Creatinine, Ser: 1.29 mg/dL — ABNORMAL HIGH (ref 0.44–1.00)
GFR, Estimated: 41 mL/min — ABNORMAL LOW (ref >60)
Glucose, Bld: 359 mg/dL — ABNORMAL HIGH (ref 70–99)
Potassium: 4.1 mmol/L (ref 3.5–5.1)
Sodium: 132 mmol/L — ABNORMAL LOW (ref 135–145)

## 2023-03-03 LAB — GLUCOSE, CAPILLARY
Glucose-Capillary: 262 mg/dL — ABNORMAL HIGH (ref 70–99)
Glucose-Capillary: 297 mg/dL — ABNORMAL HIGH (ref 70–99)
Glucose-Capillary: 227 mg/dL — ABNORMAL HIGH (ref 70–99)
Glucose-Capillary: 257 mg/dL — ABNORMAL HIGH (ref 70–99)

## 2023-03-03 LAB — MAGNESIUM: Magnesium: 1.9 mg/dL (ref 1.7–2.4)

## 2023-03-03 LAB — CBC
HCT: 45.2 % (ref 36.0–46.0)
Hemoglobin: 14.6 g/dL (ref 12.0–15.0)
MCH: 29.3 pg (ref 26.0–34.0)
MCHC: 32.3 g/dL (ref 30.0–36.0)
MCV: 90.8 fL (ref 80.0–100.0)
Platelets: 244 10*3/uL (ref 150–400)
RBC: 4.98 MIL/uL (ref 3.87–5.11)
RDW: 14.4 % (ref 11.5–15.5)
WBC: 6.7 10*3/uL (ref 4.0–10.5)
nRBC: 0 % (ref 0.0–0.2)

## 2023-03-03 MED ORDER — INSULIN ASPART 100 UNIT/ML IJ SOLN
10.0000 [IU] | Freq: Three times a day (TID) | INTRAMUSCULAR | Status: DC
  Administered 2023-03-03 – 2023-03-04 (×3): 10 [IU] via SUBCUTANEOUS
  Filled 2023-03-03 (×3): qty 1

## 2023-03-03 MED ORDER — MAGNESIUM SULFATE 4 GM/100ML IV SOLN
4.0000 g | Freq: Once | INTRAVENOUS | Status: AC
  Administered 2023-03-03: 4 g via INTRAVENOUS
  Filled 2023-03-03 (×2): qty 100

## 2023-03-03 NOTE — Progress Notes (Signed)
PROGRESS NOTE    Samantha Freeman  ZOX:[id number] DOB: 1935-11-25 DOA: 02/25/2023 PCP: Marguarite Arbour, MD  [id number]  LOS: 6 days   Brief hospital course: 87 y.o. female with medical history significant of recently diagnosed atrial fibrillation not on [facility], recently diagnosed CHF, type 2 diabetes, hypertension, hyperlipidemia, CKD stage IIIa, depression/anxiety, who presents to the ED due to atrial fibrillation.   Samantha Freeman states she has been experiencing generalized weakness, fatigue, DOE and orthopnea for the last few weeks, but her symptoms significantly worsened over the last 1-2 days.  She notes she occasionally experiences shortness of breath at rest but this comes and goes; when she experiences shortness of breath, it is sudden onset.  She endorses some chest rightness when shortness of breath starts but denies any at this time.  She denies any palpitations, nausea, vomiting, fever, chills.  She notes a poor appetite but denies any abdominal distention, weight loss or acute weight gain.  11/9: Remained medically stable, awaiting SNF placement. Insurance authorization obtained for admission on Monday.  Assessment & Plan:  Atrial fibrillation with RVR [facility]) Patient presenting with ongoing weakness, fatigue, DOB with acute worsening over the last 1-2 days.  Telemetry with atrial fibrillation with RVR.  Patient stopped anticoagulation after developing epistaxis requiring packing, and declined anticoagulation.   --was first dx during Oct hospitalization. --started on dilt gtt and oral dilt --cardiology consulted, s/p IV mag load --Cardizem 240 held this morning due to systolic <100 per holding parameter.  HR trending up again.  Cardiology notified. Plan: --re-engage cardio team  Acute hypoxic respiratory failure (HCC) Patient has mild pulmonary edema likely triggered by atrial fibrillation with RVR.  CTA without PE.  No signs of infectious process. --received IV lasix 40 daily x 4   --pt reported her baseline O2 sats are low, and denied dyspnea or need for supplemental oxygen. Plan: --O2 sats goal 90%.  Wean down to RA as tolerated.  Acute heart failure with preserved ejection fraction (HFpEF) (HCC) Likely triggered by atrial fibrillation with RVR.  BNP is elevated at 512, higher than previous admission. --received IV lasix 40 daily x 4  Plan: --cont farxiga   Generalized weakness Likely due to multiple new comorbidities including atrial fibrillation with RVR and physical deconditioning. PT and OT --SNF rehab   Anxiety and depression Continue home Xanax and Atarax   HTN (hypertension) - Hold home amlodipine and enalapril due to soft BP   DM (diabetes mellitus) (HCC) Hyperglycemia - A1c 7.9, poorly controlled, persistent hyperglycemia --Hold home insulin products - Continue home Farxiga --cont glargine 35u nightly --increase mealtime to 10u TID - SSI, moderate   AKI Chronic kidney disease, stage 3a (HCC) --Cr increased due to diuresis.  Diuretic held. --cont to hold diuretics -Encourage p.o. hydration  Hypokalemia --monitor and supplement PRN  Hyponatremia --intermittent, mild, of unclear significance    DVT prophylaxis: Lovenox SQ Code Status: DNR  Family Communication:  Level of care: Telemetry Cardiac Dispo:   The patient is from: home Anticipated d/c is to: SNF rehab Anticipated d/c date is: 1-2 days   Subjective and Interval History:  Pt reported feeling better.  Normal oral intake.  No dyspnea.  HR became elevated again.  BP too low to give ordered cardizem.  Cardio notified.   Objective: Vitals:   03/03/23 0500 03/03/23 0754 03/03/23 1207 03/03/23 1545  BP: 103/75 97/81 114/85   Pulse: (!) 110 (!) 110    Resp: (!) 25     Temp: 98  F (36.7 C) 98 F (36.7 C) 98.7 F (37.1 C) 98.5 F (36.9 C)  TempSrc: Oral Oral Oral Oral  SpO2: 93%     Weight: 79 kg       Intake/Output Summary (Last 24 hours) at 03/03/2023  1908 Last data filed at 03/03/2023 1617 Gross per 24 hour  Intake 0 ml  Output --  Net 0 ml   Filed Weights   02/28/23 0802 03/01/23 0745 03/03/23 0500  Weight: 76.4 kg 79.4 kg 79 kg    Examination:   Constitutional: NAD, AAOx3 HEENT: conjunctivae and lids normal, EOMI CV: No cyanosis.   RESP: normal respiratory effort, on 3L Extremities: No effusions, edema in BLE SKIN: warm, dry Neuro: II - XII grossly intact.   Psych: Normal mood and affect.  Appropriate judgement and reason   Data Reviewed: I have personally reviewed labs and imaging studies  Time spent: 50 minutes  Darlin Priestly, MD [facility] If 7PM-7AM, please contact night-coverage 03/03/2023, 7:08 PM

## 2023-03-03 NOTE — Plan of Care (Signed)

## 2023-03-03 NOTE — Progress Notes (Signed)
PT Cancellation Note  Patient Details Name: Samantha Freeman MRN: [id number] DOB: February 09, 1936   Cancelled Treatment:     Pt received in bed with O2 and Telemetry reading constant on monitor. Pt alert. PT treatment not completed today  2/2 pt HR between 110 to 124 at rest. BP 103/89. As per nurse, pt has been having tachycardia since last night. PT will attempt again tomorrow if pt is appropriate.   Kelven Flater H Kyasia Steuck 03/03/2023, 9:48 AM

## 2023-03-03 NOTE — Progress Notes (Signed)
   03/03/23 1545  Assess: MEWS Score  Temp 98.5 F (36.9 C)  O2 Device Room Air  Assess: MEWS Score  MEWS Temp 0  MEWS Systolic 0  MEWS Pulse 1  MEWS RR 1  MEWS LOC 0  MEWS Score 2  MEWS Score Color Yellow  Assess: if the MEWS score is Yellow or Red  Were vital signs accurate and taken at a resting state? Yes  Does the patient meet 2 or more of the SIRS criteria? Yes  Does the patient have a confirmed or suspected source of infection? Yes  MEWS guidelines implemented  No, previously red, continue vital signs every 4 hours  Notify: Charge Nurse/RN  Name of Charge Nurse/RN Notified Shamia, RN  Assess: SIRS CRITERIA  SIRS Temperature  0  SIRS Pulse 1  SIRS Respirations  1  SIRS WBC 0  SIRS Score Sum  2

## 2023-03-04 DIAGNOSIS — I4891 Unspecified atrial fibrillation: Secondary | ICD-10-CM | POA: Diagnosis not present

## 2023-03-04 LAB — CBC
HCT: 45 % (ref 36.0–46.0)
Hemoglobin: 14.9 g/dL (ref 12.0–15.0)
MCH: 29.7 pg (ref 26.0–34.0)
MCHC: 33.1 g/dL (ref 30.0–36.0)
MCV: 89.8 fL (ref 80.0–100.0)
Platelets: 229 10*3/uL (ref 150–400)
RBC: 5.01 MIL/uL (ref 3.87–5.11)
RDW: 14.5 % (ref 11.5–15.5)
WBC: 6.5 10*3/uL (ref 4.0–10.5)
nRBC: 0 % (ref 0.0–0.2)

## 2023-03-04 LAB — BASIC METABOLIC PANEL
Anion gap: 10 (ref 5–15)
BUN: 40 mg/dL — ABNORMAL HIGH (ref 8–23)
CO2: 26 mmol/L (ref 22–32)
Calcium: 9.4 mg/dL (ref 8.9–10.3)
Chloride: 101 mmol/L (ref 98–111)
Creatinine, Ser: 1.21 mg/dL — ABNORMAL HIGH (ref 0.44–1.00)
GFR, Estimated: 44 mL/min — ABNORMAL LOW (ref >60)
Glucose, Bld: 254 mg/dL — ABNORMAL HIGH (ref 70–99)
Potassium: 4 mmol/L (ref 3.5–5.1)
Sodium: 137 mmol/L (ref 135–145)

## 2023-03-04 LAB — GLUCOSE, CAPILLARY
Glucose-Capillary: 273 mg/dL — ABNORMAL HIGH (ref 70–99)
Glucose-Capillary: 272 mg/dL — ABNORMAL HIGH (ref 70–99)
Glucose-Capillary: 205 mg/dL — ABNORMAL HIGH (ref 70–99)
Glucose-Capillary: 281 mg/dL — ABNORMAL HIGH (ref 70–99)

## 2023-03-04 LAB — MAGNESIUM: Magnesium: 2.5 mg/dL — ABNORMAL HIGH (ref 1.7–2.4)

## 2023-03-04 MED ORDER — INSULIN GLARGINE-YFGN 100 UNIT/ML ~~LOC~~ SOLN
40.0000 [IU] | Freq: Every day | SUBCUTANEOUS | Status: DC
  Administered 2023-03-04 – 2023-03-10 (×7): 40 [IU] via SUBCUTANEOUS
  Filled 2023-03-04 (×8): qty 0.4

## 2023-03-04 MED ORDER — INSULIN ASPART 100 UNIT/ML IJ SOLN
12.0000 [IU] | Freq: Three times a day (TID) | INTRAMUSCULAR | Status: DC
  Administered 2023-03-04 – 2023-03-05 (×5): 12 [IU] via SUBCUTANEOUS
  Filled 2023-03-04 (×6): qty 1

## 2023-03-04 NOTE — Inpatient Diabetes Management (Signed)
Inpatient Diabetes Program Recommendations  AACE/ADA: New Consensus Statement on Inpatient Glycemic Control (2015)  Target Ranges:  Prepandial:   less than 140 mg/dL      Peak postprandial:   less than 180 mg/dL (1-2 hours)      Critically ill patients:  140 - 180 mg/dL    Latest Reference Range & Units 03/03/23 08:02 03/03/23 12:10 03/03/23 16:07 03/03/23 21:49  Glucose-Capillary 70 - 99 mg/dL 098 (H)  14 units Novolog  297 (H)  18 units Novolog  227 (H)  15 units Novolog  257 (H)   35 units Semglee  (H): Data is abnormally high  Latest Reference Range & Units 03/04/23 07:55  Glucose-Capillary 70 - 99 mg/dL 119 (H)  (H): Data is abnormally high   Home DM Meds: Lantus 36 units at bedtime     Humalog 7-19 units TID     Farxiga 10 mg daily      Current Orders: Semglee 35 units at bedtime                           Novolog Moderate Correction Scale/ SSI (0-15 units) TID AC                           Novolog 10 units TID with meals                           Farxiga 10 mg daily   MD- Note CBG 273 this AM.  Please consider:  1. Increase Semglee to 40 units at bedtime  2. Increase Novolog Meal Coverage to 12 units TID with meals    --Will follow patient during hospitalization--  Ambrose Finland RN, MSN, CDCES Diabetes Coordinator Inpatient Glycemic Control Team Team Pager: [phone] (8a-5p)

## 2023-03-04 NOTE — Progress Notes (Addendum)
Physical Therapy Treatment Patient Details Name: Samantha Freeman MRN: [id number] DOB: 10/08/1935 Today's Date: 03/04/2023   History of Present Illness Samantha Freeman is a 87 y.o. female with medical history significant of recently diagnosed atrial fibrillation not on [facility], recently diagnosed CHF, type 2 diabetes, hypertension, hyperlipidemia, CKD stage IIIa, depression/anxiety, who presents to the ED on 02/25/23 due to atrial fibrillation.    PT Comments  Pt A&Ox4, reported no pain at rest. Pt able to doff O2 for mobility (stated she has been doing this with nursing staff) and noted to be >90% throughout mobility. RN notified and on room at 95% at end of session. Pt ultimately supervision for bed mobility and transfers with RW, as well as for pericare in sitting. CGA-supervision for mobility. PT/pt discussed barriers at discharge and pt reported feeling like she doesn't have "the energy" and is "wore out" to take care of herself and functional activities such as meals, multiple trips of ambulation during the day, etc. Pt up in recliner at end of session, needs in reach. The patient would benefit from further skilled PT intervention to continue to progress towards goals.    HR ins 110s-130s throughout mobility, no complaints of SOB, chest pain, or palpitations and RN notified of pt status.    If plan is discharge home, recommend the following: A little help with walking and/or transfers;A little help with bathing/dressing/bathroom;Assistance with cooking/housework;Assist for transportation   Can travel by private vehicle     Yes  Equipment Recommendations  None recommended by PT    Recommendations for Other Services       Precautions / Restrictions Precautions Precautions: Fall Restrictions Weight Bearing Restrictions: No     Mobility  Bed Mobility Overal bed mobility: Needs Assistance Bed Mobility: Supine to Sit     Supine to sit: Supervision          Transfers Overall  transfer level: Needs assistance Equipment used: Rolling walker (2 wheels) Transfers: Sit to/from Stand Sit to Stand: Supervision           General transfer comment: needed several attempts to gain momentum to successfully stand, cued for hand placement    Ambulation/Gait Ambulation/Gait assistance: Contact guard assist, Supervision Gait Distance (Feet):  (36ft in room from bed to door to chair to bathroom. additional 38ft after) Assistive device: Rolling walker (2 wheels) Gait Pattern/deviations: Step-through pattern, Decreased step length - right, Decreased step length - left, Decreased stride length Gait velocity: Decreased     General Gait Details: Pt amb approx 25 ft using RW CGA for safety; notable decreased stride length throughout with occasional R knee buckling due to decreased activity per Pt's report; VSS monitored throughout with HR briefly spiking to 138bpm but fluctuating between high 90s-115s bpm predominantly with no reports of difficulty or SOB   Stairs             Wheelchair Mobility     Tilt Bed    Modified Rankin (Stroke Patients Only)       Balance Overall balance assessment: Modified Independent Sitting-balance support: Feet supported Sitting balance-Leahy Scale: Normal Sitting balance - Comments: Pt able to maintain seated balance at edge of seat during functional activities without LOB noted   Standing balance support: During functional activity, Single extremity supported Standing balance-Leahy Scale: Good Standing balance comment: able to statically stand at sink with unilateral UE support to brush teeth  Cognition Arousal: Alert Behavior During Therapy: WFL for tasks assessed/performed Overall Cognitive Status: Within Functional Limits for tasks assessed                                          Exercises      General Comments        Pertinent Vitals/Pain Pain  Assessment Pain Assessment: No/denies pain    Home Living                          Prior Function            PT Goals (current goals can now be found in the care plan section) Progress towards PT goals: Progressing toward goals    Frequency    Min 1X/week      PT Plan      Co-evaluation              AM-PAC PT "6 Clicks" Mobility   Outcome Measure  Help needed turning from your back to your side while in a flat bed without using bedrails?: None Help needed moving from lying on your back to sitting on the side of a flat bed without using bedrails?: None Help needed moving to and from a bed to a chair (including a wheelchair)?: None Help needed standing up from a chair using your arms (e.g., wheelchair or bedside chair)?: None Help needed to walk in hospital room?: A Little Help needed climbing 3-5 steps with a railing? : A Little 6 Click Score: 22    End of Session Equipment Utilized During Treatment: Gait belt Activity Tolerance: Patient tolerated treatment well Patient left: in chair;with call bell/phone within reach;with chair alarm set Nurse Communication: Mobility status PT Visit Diagnosis: Muscle weakness (generalized) (M62.81);Difficulty in walking, not elsewhere classified (R26.2)     Time: 5284-1324 PT Time Calculation (min) (ACUTE ONLY): 24 min  Charges:    $Therapeutic Activity: 23-37 mins PT General Charges $$ ACUTE PT VISIT: 1 Visit                     Olga Coaster PT, DPT 2:35 PM,03/04/23

## 2023-03-04 NOTE — Progress Notes (Signed)
PROGRESS NOTE    Samantha Freeman  ZOX:[id number] DOB: [birth year] DOA: 02/25/2023 PCP: Samantha Arbour, MD  [id number]  LOS: 7 days   Brief hospital course: 87 y.o. female with medical history significant of recently diagnosed atrial fibrillation not on [facility], recently diagnosed CHF, type 2 diabetes, hypertension, hyperlipidemia, CKD stage IIIa, depression/anxiety, who presents to the ED due to atrial fibrillation.   [name] states she has been experiencing generalized weakness, fatigue, DOE and orthopnea for the last few weeks, but her symptoms significantly worsened over the last 1-2 days.  She notes she occasionally experiences shortness of breath at rest but this comes and goes; when she experiences shortness of breath, it is sudden onset.  She endorses some chest rightness when shortness of breath starts but denies any at this time.  She denies any palpitations, nausea, vomiting, fever, chills.  She notes a poor appetite but denies any abdominal distention, weight loss or acute weight gain.  Assessment & Plan:  Atrial fibrillation with RVR [facility]) Patient presenting with ongoing weakness, fatigue, DOB with acute worsening over the last 1-2 days.  Telemetry with atrial fibrillation with RVR.  Patient stopped anticoagulation after developing epistaxis requiring packing, and declined anticoagulation.   --was first dx during Oct hospitalization. --started on dilt gtt and oral dilt --cardiology consulted, s/p IV mag load.  Consolidated dilt to cardizem 240 mg daily --Cardizem 240 held morning of 11/10 due to systolic <100 per holding parameter.  HR trending up again.  Cardiology notified on 11/10. Plan: --oral regimen to control HR per cardio  Acute hypoxic respiratory failure (HCC) Patient has mild pulmonary edema likely triggered by atrial fibrillation with RVR.  CTA without PE.  No signs of infectious process. --received IV lasix 40 daily x 4  --pt reported her baseline O2 sats are  low, and denied dyspnea or need for supplemental oxygen. Plan: --O2 sats goal 90%.  Wean down to RA as tolerated.  Acute heart failure with preserved ejection fraction (HFpEF) (HCC) Likely triggered by atrial fibrillation with RVR.  BNP is elevated at 512, higher than previous admission. --received IV lasix 40 daily x 4  Plan: --cont farxiga --cont Lopressor   Generalized weakness Likely due to multiple new comorbidities including atrial fibrillation with RVR and physical deconditioning. PT and OT --SNF rehab   Anxiety and depression Continue home Xanax and Atarax   HTN (hypertension) - Hold home amlodipine and enalapril due to soft BP   DM (diabetes mellitus) (HCC) Hyperglycemia - A1c 7.9, poorly controlled, persistent hyperglycemia --Hold home insulin products - Continue home Farxiga --increase glargine to 40u nightly --increase mealtime to 12u TID - SSI, moderate   AKI Chronic kidney disease, stage 3a (HCC) --Cr increased due to diuresis.  Diuretic held. --cont to hold diuretics -Encourage p.o. hydration  Hypokalemia --monitor and supplement PRN  Hyponatremia --intermittent, mild, of unclear significance    DVT prophylaxis: Lovenox SQ Code Status: DNR  Family Communication:  Level of care: Telemetry Cardiac Dispo:   The patient is from: home Anticipated d/c is to: SNF rehab Anticipated d/c date is: to be determined   Subjective and Interval History:  HR back in 120's-130's this morning.  Cardiology notified.   Objective: Vitals:   03/04/23 0805 03/04/23 0847 03/04/23 1145 03/04/23 1605  BP:  (!) 129/91 107/69 100/73  Pulse:   (!) 117 (!) 115  Resp:   20 20  Temp: 97.6 F (36.4 C)  98.6 F (37 C) 98.1 F (36.7 C)  TempSrc:  Oral  Oral Oral  SpO2:   93% 90%  Weight:        Intake/Output Summary (Last 24 hours) at 03/04/2023 1842 Last data filed at 03/04/2023 0123 Gross per 24 hour  Intake 240 ml  Output --  Net 240 ml   Filed Weights    02/28/23 0802 03/01/23 0745 03/03/23 0500  Weight: 76.4 kg 79.4 kg 79 kg    Examination:   Constitutional: NAD, AAOx3 HEENT: conjunctivae and lids normal, EOMI CV: No cyanosis.   RESP: normal respiratory effort, on RA Extremities: No effusions, edema in BLE SKIN: warm, dry Neuro: II - XII grossly intact.   Psych: Normal mood and affect.  Appropriate judgement and reason   Data Reviewed: I have personally reviewed labs and imaging studies  Time spent: 35 minutes  Darlin Priestly, MD [facility] If 7PM-7AM, please contact night-coverage 03/04/2023, 6:42 PM

## 2023-03-04 NOTE — Plan of Care (Signed)
  Problem: Health Behavior/Discharge Planning: Goal: Ability to manage health-related needs will improve Outcome: Progressing   Problem: Fluid Volume: Goal: Ability to maintain a balanced intake and output will improve Outcome: Progressing   Problem: Clinical Measurements: Goal: Ability to maintain clinical measurements within normal limits will improve Outcome: Progressing   Problem: Safety: Goal: Ability to remain free from injury will improve Outcome: Progressing

## 2023-03-04 NOTE — TOC Progression Note (Signed)
Transition of Care [facility]) - Progression Note    Patient Details  Name: Samantha Freeman MRN: [id number] Date of Birth: [birth year]  [facility] CM/SW Contact  Truddie Hidden, RN Phone Number: 03/04/2023, 11:21 AM  Clinical Narrative:    Retrieved message from Tiffany at [facility] regarding discharge. Per MD patient unable to discharge today related to heart beat being unregulated. RNCM updated Tiffany.     Expected Discharge Plan: Skilled Nursing Facility Barriers to Discharge: Continued Medical Work up  Expected Discharge Plan and Services     Post Acute Care Choice: Skilled Nursing Facility Living arrangements for the past 2 months: Apartment                                       Social Determinants of Health (SDOH) Interventions SDOH Screenings   Food Insecurity: No Food Insecurity (02/25/2023)  Housing: Low Risk  (02/25/2023)  Transportation Needs: No Transportation Needs (02/25/2023)  Utilities: Not At Risk (02/25/2023)  Tobacco Use: Low Risk  (02/25/2023)   Received from [facility]    Readmission Risk Interventions     No data to display

## 2023-03-04 NOTE — Plan of Care (Signed)

## 2023-03-05 DIAGNOSIS — I4891 Unspecified atrial fibrillation: Secondary | ICD-10-CM | POA: Diagnosis not present

## 2023-03-05 LAB — BASIC METABOLIC PANEL
Anion gap: 10 (ref 5–15)
BUN: 43 mg/dL — ABNORMAL HIGH (ref 8–23)
CO2: 28 mmol/L (ref 22–32)
Calcium: 9.3 mg/dL (ref 8.9–10.3)
Chloride: 99 mmol/L (ref 98–111)
Creatinine, Ser: 1.41 mg/dL — ABNORMAL HIGH (ref 0.44–1.00)
GFR, Estimated: 37 mL/min — ABNORMAL LOW (ref >60)
Glucose, Bld: 192 mg/dL — ABNORMAL HIGH (ref 70–99)
Potassium: 4 mmol/L (ref 3.5–5.1)
Sodium: 137 mmol/L (ref 135–145)

## 2023-03-05 LAB — GLUCOSE, CAPILLARY
Glucose-Capillary: 162 mg/dL — ABNORMAL HIGH (ref 70–99)
Glucose-Capillary: 227 mg/dL — ABNORMAL HIGH (ref 70–99)
Glucose-Capillary: 239 mg/dL — ABNORMAL HIGH (ref 70–99)
Glucose-Capillary: 264 mg/dL — ABNORMAL HIGH (ref 70–99)

## 2023-03-05 LAB — MAGNESIUM: Magnesium: 2 mg/dL (ref 1.7–2.4)

## 2023-03-05 MED ORDER — GLUCERNA SHAKE PO LIQD
237.0000 mL | Freq: Two times a day (BID) | ORAL | Status: DC
  Administered 2023-03-06 – 2023-03-11 (×10): 237 mL via ORAL

## 2023-03-05 MED ORDER — DILTIAZEM HCL 30 MG PO TABS
60.0000 mg | ORAL_TABLET | Freq: Four times a day (QID) | ORAL | Status: DC
Start: 2023-03-05 — End: 2023-03-05

## 2023-03-05 MED ORDER — INSULIN ASPART 100 UNIT/ML IJ SOLN
15.0000 [IU] | Freq: Three times a day (TID) | INTRAMUSCULAR | Status: DC
  Administered 2023-03-06 – 2023-03-11 (×15): 15 [IU] via SUBCUTANEOUS
  Filled 2023-03-05 (×14): qty 1

## 2023-03-05 MED ORDER — ROPINIROLE HCL ER 4 MG PO TB24
4.0000 mg | ORAL_TABLET | Freq: Every day | ORAL | Status: DC
  Administered 2023-03-05 – 2023-03-11 (×6): 4 mg via ORAL
  Filled 2023-03-05 (×6): qty 1

## 2023-03-05 MED ORDER — DILTIAZEM HCL 30 MG PO TABS
60.0000 mg | ORAL_TABLET | Freq: Three times a day (TID) | ORAL | Status: DC
  Administered 2023-03-05 – 2023-03-08 (×8): 60 mg via ORAL
  Filled 2023-03-05 (×8): qty 2

## 2023-03-05 NOTE — Progress Notes (Signed)
PROGRESS NOTE    Samantha Freeman  ZOX:[id number] DOB: [birth year] DOA: 02/25/2023 PCP: Marguarite Arbour, MD  [id number]  LOS: 8 days    Samantha Freeman is a 87 y.o. female with medical history significant of recently diagnosed atrial fibrillation not on [facility]), recently diagnosed CHF, type 2 diabetes, hypertension, hyperlipidemia, CKD stage IIIa, who presented to the ED due to atrial fibrillation.   Samantha Freeman states she has been experiencing generalized weakness, fatigue, DOE and orthopnea for the last few weeks, but her symptoms significantly worsened over the last 1-2 days PTA.  Assessment & Plan:  Atrial fibrillation with RVR [facility]) Patient presenting with ongoing weakness, fatigue, DOB with acute worsening over the last 1-2 days.  Telemetry with atrial fibrillation with RVR.  Patient stopped anticoagulation after developing epistaxis requiring packing, and declined anticoagulation.   --Afib was first dx during Oct hospitalization. --on current presentation, pt was started on dilt gtt and oral dilt.  Unable to increase rate control agents due to low BP.   --cardiology consulted, s/p IV mag load.  Consolidated dilt to cardizem 240 mg daily --Cardizem 240 held morning of 11/10 due to systolic <100 per holding parameter.  HR trending up again.  Cardiology notified on 11/10, returned to see pt today. Plan: --Cardio to establish oral regimen to control HR prior to discharge.    Acute hypoxic respiratory failure (HCC) Patient has mild pulmonary edema likely triggered by atrial fibrillation with RVR.  CTA without PE.  No signs of infectious process. --received IV lasix 40 daily x 4  --pt reported her baseline O2 sats are low, and denied dyspnea or need for supplemental oxygen. Plan: --O2 sat goal 90%  Acute heart failure with preserved ejection fraction (HFpEF) (HCC) Likely triggered by atrial fibrillation with RVR.  BNP is elevated at 512, higher than previous admission. --received IV  lasix 40 daily x 4  Plan: --cont farxiga --cont Lopressor   Generalized weakness Likely due to multiple new comorbidities including atrial fibrillation with RVR and physical deconditioning. PT and OT --SNF rehab   Anxiety and depression Continue home Xanax and Atarax   HTN (hypertension) - Hold home amlodipine and enalapril due to soft BP   DM (diabetes mellitus) (HCC) Hyperglycemia - A1c 7.9, poorly controlled, persistent hyperglycemia --Hold home insulin products - Continue home Farxiga --cont glargine 40u nightly --increase mealtime to 15u TID - SSI, moderate   AKI Chronic kidney disease, stage 3a (HCC) --Cr increased due to diuresis.  Diuretic held. --cont to hold diuretics -Encourage p.o. hydration  Hypokalemia --monitor and supplement PRN  Hyponatremia --intermittent, mild, of unclear significance  Obesity    DVT prophylaxis: Lovenox SQ Code Status: DNR  Family Communication:  Level of care: Telemetry Cardiac Dispo:   The patient is from: home Anticipated d/c is to: SNF rehab Anticipated d/c date is: to be determined.  Needs to be rate controlled on stable oral regimen for 2 days before discharge.   Subjective and Interval History:  Pt had no complaint today.  Good appetite.  Cardio returned to see pt today.   Objective: Vitals:   03/05/23 0755 03/05/23 0826 03/05/23 1233 03/05/23 1600  BP:  (!) 134/101 99/88 94/76  Pulse:  (!) 128 100 (!) 106  Resp:  17  18  Temp: 97.6 F (36.4 C)  97.8 F (36.6 C)   TempSrc: Oral  Oral   SpO2:  95% 98%   Weight:        Intake/Output Summary (Last 24 hours) at  03/05/2023 1845 Last data filed at 03/05/2023 1136 Gross per 24 hour  Intake 240 ml  Output --  Net 240 ml   Filed Weights   02/28/23 0802 03/01/23 0745 03/03/23 0500  Weight: 76.4 kg 79.4 kg 79 kg    Examination:   Constitutional: NAD, AAOx3 HEENT: conjunctivae and lids normal, EOMI CV: No cyanosis.   RESP: normal respiratory effort,  on RA Neuro: II - XII grossly intact.   Psych: Normal mood and affect.  Appropriate judgement and reason   Data Reviewed: I have personally reviewed labs and imaging studies  Time spent: 35 minutes  Darlin Priestly, MD [facility] If 7PM-7AM, please contact night-coverage 03/05/2023, 6:45 PM

## 2023-03-05 NOTE — Care Management Important Message (Signed)
Important Message  Patient Details  Name: Samantha Freeman MRN: [id number] Date of Birth: 16-Dec-1935   Important Message Given:  Yes - Medicare IM     [name] A [name] 03/05/2023, 11:04 AM

## 2023-03-05 NOTE — TOC Progression Note (Signed)
Transition of Care [facility]) - Progression Note    Patient Details  Name: Samantha Freeman MRN: [id number] Date of Birth: 1936/03/16  Transition of Care [facility]) CM/SW Contact  Truddie Hidden, RN Phone Number: 03/05/2023, 11:18 AM  Clinical Narrative:    Received message from Tiffany from [facility] admissions inquiring about patient returning today. Per MD patient is not medially ready related to her heart rate. Tiffany notified.  Auth extension expires 11/13 at 11:59 pm.    Expected Discharge Plan: Skilled Nursing Facility Barriers to Discharge: Continued Medical Work up  Expected Discharge Plan and Services     Post Acute Care Choice: Skilled Nursing Facility Living arrangements for the past 2 months: Apartment                                       Social Determinants of Health (SDOH) Interventions SDOH Screenings   Food Insecurity: No Food Insecurity (02/25/2023)  Housing: Low Risk  (02/25/2023)  Transportation Needs: No Transportation Needs (02/25/2023)  Utilities: Not At Risk (02/25/2023)  Tobacco Use: Low Risk  (02/25/2023)   Received from [facility]    Readmission Risk Interventions     No data to display

## 2023-03-05 NOTE — Progress Notes (Signed)
[facility] CLINIC CARDIOLOGY PROGRESS NOTE       Patient ID: Samantha Freeman MRN: [id number] DOB/AGE: 85/24/1937 85 y.o.  Admit date: 02/25/2023 Referring Physician Dr. Darlin Priestly Primary Physician Sparks, Duane Lope, MD  Primary Cardiologist Leanora Ivanoff, PA Reason for Consultation atrial fibrillation RVR  HPI: Samantha Freeman is a 87 y.o. female  with a past medical history of recently diagnosed atrial fibrillation, mild aortic stenosis, bradycardia, type 2 diabetes, hypercholesterolemia who presented to the ED on 02/25/2023 for generalized weakness and fatigue. Found to be in AF RVR in the ED. Cardiology was consulted for further evaluation.   Interval history: -Patient feeling well this AM, sitting upright in hospital bed this AM.  -HR variable, ranging from 90-110s. Short paroxysms up to 120s.  -BP borderline, she denies any dizziness or lightheadedness.  Review of systems complete and found to be negative unless listed above    Past Medical History:  Diagnosis Date   Anemia    Anxiety    Aortic stenosis, mild    Arthritis    CKD (chronic kidney disease), stage III (HCC)    Complication of anesthesia    Propofol anaphylaxis   Cortical cataract    DDD (degenerative disc disease), lumbar    Depression    Dyspnea    GERD (gastroesophageal reflux disease)    Headache    Heart murmur    History of 2019 novel coronavirus disease (COVID-19) 12/01/2018   HLD (hyperlipidemia)    Hypertension    Pneumonia    Schatzki's ring    Seasonal allergies    T2DM (type 2 diabetes mellitus) (HCC)    Valvular insufficiency     Past Surgical History:  Procedure Laterality Date   ABDOMINAL HYSTERECTOMY     APPENDECTOMY     BICEPT TENODESIS Right 05/13/2018   Procedure: BICEPS TENODESIS;  Surgeon: Christena Flake, MD;  Location: [facility] ORS;  Service: Orthopedics;  Laterality: Right;   CARDIAC CATHETERIZATION     COLONOSCOPY     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   EYE SURGERY Right  09/2013   Corneal transplant   JOINT REPLACEMENT Right 2015   knee   REVERSE SHOULDER ARTHROPLASTY Left 10/13/2020   Procedure: REVERSE SHOULDER ARTHROPLASTY;  Surgeon: Christena Flake, MD;  Location: [facility] ORS;  Service: Orthopedics;  Laterality: Left;   SHOULDER ARTHROSCOPY WITH ROTATOR CUFF REPAIR Right 05/13/2018   Procedure: SHOULDER ARTHROSCOPY WITH DEBRIDEMENT, DECOMPRESSION AND ROTATOR CUFF REPAIR;  Surgeon: Christena Flake, MD;  Location: [facility] ORS;  Service: Orthopedics;  Laterality: Right;   TONSILLECTOMY      Medications Prior to Admission  Medication Sig Dispense Refill Last Dose   ALPRAZolam (XANAX) 0.5 MG tablet Take 0.5 mg by mouth at bedtime.   02/24/2023   aspirin 325 MG tablet Take 325 mg by mouth daily.   02/25/2023   Calcium Carb-Cholecalciferol (CALCIUM 600/VITAMIN D3 PO) Take 1 tablet by mouth daily.   02/25/2023   Coenzyme Q10 10 MG capsule Take 10 mg by mouth every morning.   02/25/2023   dapagliflozin propanediol (FARXIGA) 10 MG TABS tablet Take 10 mg by mouth daily.   02/25/2023   enalapril (VASOTEC) 20 MG tablet Take 20 mg by mouth 2 (two) times daily.   02/25/2023   fenofibrate (TRICOR) 48 MG tablet Take 1 tablet by mouth daily.   02/25/2023   furosemide (LASIX) 20 MG tablet Take 20 mg by mouth daily as needed for fluid or edema.   prn at  unknown   gabapentin (NEURONTIN) 100 MG capsule Take 200 mg by mouth 3 (three) times daily.   02/25/2023   hydrOXYzine (ATARAX) 25 MG tablet Take 1 tablet by mouth 2 (two) times daily.   02/25/2023   insulin lispro (HUMALOG) 100 UNIT/ML injection Inject 7-19 Units into the skin 3 (three) times daily before meals. Blood Glucose level: 140-199 - 7 units, 200-250 - 9 units, 251-299 - 13 units,  300-349 - 17 units,  350 or above 19 units.   02/25/2023   LANTUS SOLOSTAR 100 UNIT/ML Solostar Pen Inject 36 Units into the skin at bedtime.   02/24/2023   magnesium oxide (MAG-OX) 400 MG tablet Take 800 mg by mouth 3 (three) times daily.   02/25/2023    metoprolol tartrate (LOPRESSOR) 50 MG tablet Take 1 tablet by mouth 2 (two) times daily.   02/25/2023   Multiple Vitamin (MULTIVITAMIN) tablet Take 1 tablet by mouth daily. Women's Daily Multivitamin   02/25/2023   pantoprazole (PROTONIX) 40 MG tablet Take 1 tablet (40 mg total) by mouth daily.   02/25/2023   prednisoLONE acetate (PRED FORTE) 1 % ophthalmic suspension Place 1 drop into both eyes at bedtime.   02/24/2023   rosuvastatin (CRESTOR) 10 MG tablet Take 10 mg by mouth daily.   02/24/2023   traZODone (DESYREL) 50 MG tablet Take 50 mg by mouth at bedtime.   prn at unknown   vitamin B-12 (CYANOCOBALAMIN) 1000 MCG tablet Take 1,000 mcg by mouth daily.   02/24/2023   blood glucose meter kit and supplies KIT Dispense based on patient and insurance preference. Use up to four times daily as directed. (FOR ICD-9 250.00, 250.01). For QAC - HS accuchecks. 1 each 1    diltiazem (CARDIZEM CD) 180 MG 24 hr capsule Take 1 capsule (180 mg total) by mouth daily.      ferrous sulfate 325 (65 FE) MG tablet Take 325 mg by mouth daily with breakfast. (Patient not taking: Reported on 02/25/2023)   Not Taking   glucose blood (FREESTYLE LITE) test strip For glucose testing every before meals at bedtime. Diagnosis E 11.65  Can substitute to any accepted brand 100 each 0    Insulin Syringe-Needle U-100 25G X 1" 1 ML MISC For 4 times a day insulin SQ, 1 month supply. Diagnosis E11.65 30 each 0    PARoxetine (PAXIL) 10 MG tablet Take 10 mg by mouth daily.      Social History   Socioeconomic History   Marital status: Widowed    Spouse name: Not on file   Number of children: Not on file   Years of education: Not on file   Highest education level: Not on file  Occupational History   Not on file  Tobacco Use   Smoking status: Never   Smokeless tobacco: Never  Vaping Use   Vaping status: Never Used  Substance and Sexual Activity   Alcohol use: Never   Drug use: Never   Sexual activity: Not Currently  Other  Topics Concern   Not on file  Social History Narrative   Lives alone, has support from son who is a Optician, dispensing and daughter in Social worker. Will be with mother when she has surgery.   Social Determinants of Health   Financial Resource Strain: Not on file  Food Insecurity: No Food Insecurity (02/25/2023)   Hunger Vital Sign    Worried About Running Out of Food in the Last Year: Never true    Ran Out of Food in  the Last Year: Never true  Transportation Needs: No Transportation Needs (02/25/2023)   PRAPARE - Administrator, Civil Service (Medical): No    Lack of Transportation (Non-Medical): No  Physical Activity: Not on file  Stress: Not on file  Social Connections: Not on file  Intimate Partner Violence: Not At Risk (02/25/2023)   Humiliation, Afraid, Rape, and Kick questionnaire    Fear of Current or Ex-Partner: No    Emotionally Abused: No    Physically Abused: No    Sexually Abused: No    Family History  Problem Relation Age of Onset   Breast cancer Paternal Aunt      Vitals:   03/05/23 0318 03/05/23 0755 03/05/23 0826 03/05/23 1233  BP:   (!) 134/101 99/88  Pulse:   (!) 128 100  Resp:   17   Temp: 97.6 F (36.4 C) 97.6 F (36.4 C)  97.8 F (36.6 C)  TempSrc: Oral Oral  Oral  SpO2:   95% 98%  Weight:        PHYSICAL EXAM General: Well-appearing, well nourished, in no acute distress sitting upright in hospital bed. HEENT: Normocephalic and atraumatic. Neck: No JVD.  Lungs: Normal respiratory effort on room air. Clear bilaterally to auscultation. No wheezes, crackles, rhonchi.  Heart: Irregularly irregular, elevated rate. Normal S1 and S2 without gallops or murmurs.  Abdomen: Non-distended appearing.  Msk: Normal strength and tone for age. Extremities: Warm and well perfused. No clubbing, cyanosis.  No edema.  Neuro: Alert and oriented X 3. Psych: Answers questions appropriately.   Labs: Basic Metabolic Panel: Recent Labs    03/04/23 0320 03/05/23 0304  NA  137 137  K 4.0 4.0  CL 101 99  CO2 26 28  GLUCOSE 254* 192*  BUN 40* 43*  CREATININE 1.21* 1.41*  CALCIUM 9.4 9.3  MG 2.5* 2.0   Liver Function Tests: No results for input(s): "AST", "ALT", "ALKPHOS", "BILITOT", "PROT", "ALBUMIN" in the last 72 hours. No results for input(s): "LIPASE", "AMYLASE" in the last 72 hours. CBC: Recent Labs    03/03/23 0413 03/04/23 0320  WBC 6.7 6.5  HGB 14.6 14.9  HCT 45.2 45.0  MCV 90.8 89.8  PLT 244 229   Cardiac Enzymes: No results for input(s): "CKTOTAL", "CKMB", "CKMBINDEX", "TROPONINIHS" in the last 72 hours.  BNP: No results for input(s): "BNP" in the last 72 hours. D-Dimer: No results for input(s): "DDIMER" in the last 72 hours. Hemoglobin A1C: No results for input(s): "HGBA1C" in the last 72 hours. Fasting Lipid Panel: No results for input(s): "CHOL", "HDL", "LDLCALC", "TRIG", "CHOLHDL", "LDLDIRECT" in the last 72 hours. Thyroid Function Tests: No results for input(s): "TSH", "T4TOTAL", "T3FREE", "THYROIDAB" in the last 72 hours.  Invalid input(s): "FREET3" Anemia Panel: No results for input(s): "VITAMINB12", "FOLATE", "FERRITIN", "TIBC", "IRON", "RETICCTPCT" in the last 72 hours.   Radiology: CT Angio Chest PE W/Cm &/Or Wo Cm  Result Date: 02/25/2023 CLINICAL DATA:  Pulmonary embolism (PE) suspected, high prob. Weakness. Shortness of breath. EXAM: CT ANGIOGRAPHY CHEST WITH CONTRAST TECHNIQUE: Multidetector CT imaging of the chest was performed using the standard protocol during bolus administration of intravenous contrast. Multiplanar CT image reconstructions and MIPs were obtained to evaluate the vascular anatomy. RADIATION DOSE REDUCTION: This exam was performed according to the departmental dose-optimization program which includes automated exposure control, adjustment of the mA and/or kV according to patient size and/or use of iterative reconstruction technique. CONTRAST:  75mL OMNIPAQUE IOHEXOL 350 MG/ML SOLN COMPARISON:  CT  angiography chest from 01/30/2023. FINDINGS: Cardiovascular: No evidence of embolism to the proximal subsegmental pulmonary artery level. Mild cardiomegaly. No pericardial effusion. No aortic aneurysm. There are coronary artery calcifications, in keeping with coronary artery disease. There are also mild-to-moderate peripheral atherosclerotic vascular calcifications of thoracic aorta and its major branches. There is dilation of the main pulmonary trunk measuring up to 3.3 cm, which is nonspecific but can be seen with pulmonary artery hypertension. Mediastinum/Nodes: Visualized thyroid gland appears grossly unremarkable. No solid / cystic mediastinal masses. The esophagus is nondistended precluding optimal assessment. There are few mildly prominent mediastinal and hilar lymph nodes, which do not meet the size criteria for lymphadenopathy and appear grossly similar to the prior study, favoring benign etiology. No axillary lymphadenopathy by size criteria. Lungs/Pleura: The central tracheo-bronchial tree is patent. There is mild, smooth, circumferential thickening of the segmental and subsegmental bronchial walls, throughout bilateral lungs, which is nonspecific. Findings are most commonly seen with pulmonary edema, bronchitis or reactive airway disease, such as asthma. Redemonstration of filling defects in the bilateral lower lobe subsegmental bronchi, likely due to mucus/secretions or aspiration. There are patchy areas of smooth interlobular septal thickening throughout bilateral lungs and bilateral trace pleural effusions. There are patchy areas of linear, plate-like atelectasis and/or scarring throughout bilateral lungs. No mass or consolidation. No pneumothorax. No suspicious lung nodules. Upper Abdomen: Visualized upper abdominal viscera within normal limits. Musculoskeletal: The visualized soft tissues of the chest wall are grossly unremarkable. No suspicious osseous lesions. There are mild multilevel  degenerative changes in the visualized spine. Review of the MIP images confirms the above findings. IMPRESSION: *No evidence of pulmonary embolism to the proximal subsegmental pulmonary artery level. *There is mild smooth interlobular septal thickening throughout bilateral lungs with bilateral trace pleural effusions favoring congestive heart failure/pulmonary edema. *Multiple other nonacute observations, as described above. Electronically Signed   By: Jules Schick M.D.   On: 02/25/2023 14:34   DG Chest Port 1 View  Result Date: 02/25/2023 CLINICAL DATA:  Shortness of breath. EXAM: PORTABLE CHEST 1 VIEW COMPARISON:  Sep 15, 2019. FINDINGS: Mild cardiomegaly is noted. Mild central pulmonary vascular congestion is noted. Minimal bibasilar pulmonary edema is noted. Status post left shoulder arthroplasty. IMPRESSION: Mild cardiomegaly with mild central pulmonary vascular congestion and minimal bibasilar pulmonary edema. Electronically Signed   By: Lupita Raider M.D.   On: 02/25/2023 13:22    ECHO 01/2023: 1. Left ventricular ejection fraction, by estimation, is 60 to 65%. The left ventricle has normal function. The left ventricle has no regional wall motion abnormalities. There is moderate left ventricular hypertrophy. Indeterminate diastolic filling due   to E-A fusion.   2. Right ventricular systolic function is normal. The right ventricular size is normal.   3. Left atrial size was moderately dilated.   4. Right atrial size was mildly dilated.   5. The mitral valve is normal in structure. Mild mitral valve regurgitation. No evidence of mitral stenosis.   6. The aortic valve is normal in structure. Aortic valve regurgitation is  not visualized. Mild aortic valve stenosis.   7. The inferior vena cava is normal in size with greater than 50%  respiratory variability, suggesting right atrial pressure of 3 mmHg.   TELEMETRY reviewed by me 03/05/2023: atrial fibrillation rate 90-110s  EKG reviewed by  me: AF RVR rate 116 bpm  Data reviewed by me 03/05/2023: last 24h vitals tele labs imaging I/O hospitalist progress note  Principal Problem:   Atrial fibrillation with RVR (HCC)  Active Problems:   Chronic kidney disease, stage 3a (HCC)   DM (diabetes mellitus) (HCC)   HTN (hypertension)   Acute hypoxic respiratory failure (HCC)   Anxiety and depression   Acute heart failure with preserved ejection fraction (HFpEF) (HCC)   Generalized weakness    ASSESSMENT AND PLAN:  Carmin Orta is a 87 y.o. female  with a past medical history of recently diagnosed atrial fibrillation, mild aortic stenosis, bradycardia, type 2 diabetes, hypercholesterolemia who presented to the ED on 02/25/2023 for generalized weakness and fatigue. Found to be in AF RVR in the ED. Cardiology was consulted for further evaluation.   # Atrial fibrillation RVR # Recently diagnosed atrial fibrillation Patient presented with worsening weakness and fatigue. Found to be in AF RVR in the ED. Was diagnosed with AF during admission 10/9-10/14. Initially started on eliquis but this has been held due to epistaxis requiring packing. Rate controlled 11/8 but over the weekend had issues with this becoming elevated.  -Change diltiazem to 60 mg q8 hours. Continue metoprolol 25 mg twice daily. Adjustments to doses pending BP and HR.  -Home enalapril and amlodipine discontinued due to borderline BP on rate control medications.  -Patient declines resuming anticoagulation given recent nosebleeds. Educated on risks associated with atrial fibrillation and stroke.  -Not a candidate at this time for cardioversion or antiarrhythmic medications due to risk of stroke given she refuses anticoagulation.  # Acute on chronic HFpEF (EF 60-65% in 01/2023) # Acute hypoxic respiratory failure Patient with SOB prior to admission. BNP 517. S/p IV diuresis. Poorly documented output but weight down nearly 10 kg since 11/4. SOB improved overall and appears  euvolemic. -Continue farxiga.  -Continue home lasix prn on discharge.   # Hyperlipidemia -Continue rosuvastatin 10 mg daily and aspirin 81 mg daily.   This patient's plan of care was discussed and created with Dr. Juliann Pares and he is in agreement.  Signed: Gale Journey, PA-C  03/05/2023, 1:07 PM [facility] Cardiology

## 2023-03-05 NOTE — Progress Notes (Signed)
Nutrition Follow-up  DOCUMENTATION CODES:   Obesity unspecified  INTERVENTION:   -D/c Ensure Enlive po BID, each supplement provides 350 kcal and 20 grams of protein -Glucerna Shake po BID, each supplement provides 220 kcal and 10 grams of protein  -Continue MVI with minerals daily  NUTRITION DIAGNOSIS:   Inadequate oral intake related to poor appetite as evidenced by per patient/family report, meal completion < 50%.  Ongoing  GOAL:   Patient will meet greater than or equal to 90% of their needs  Progressing   MONITOR:   PO intake, Supplement acceptance  REASON FOR ASSESSMENT:   Consult Assessment of nutrition requirement/status, Poor PO  ASSESSMENT:   Pt with medical history significant of recently diagnosed atrial fibrillation not on AC, recently diagnosed CHF, type 2 diabetes, hypertension, hyperlipidemia, CKD stage IIIa, depression/anxiety, who presents due to atrial fibrillation.  Reviewed I/O's: +2.7 L x 24 hours   Spoke with pt at bedside, who was pleasant and in good spirits today. Pt smiling and watching videos on her phone when RD greeted her. She reports her appetite has greatly improved and is hungry for breakfast. She reports "my appetite is no longer a problem" and been consuming most of her meal. Noted meal completions 75-100%.   Pt reports drinking Ensure supplements, but concerned about caloric intake and how this may impact her blood sugar. She is amenable to drink Glucerna; will order due to improved oral intake and optimal glycemic control.   Reviewed wt hx; noted wt loss during admission likely related to diuresis.   Per TOC notes, plan to d/c to SNF once medically stable.   Medications reviewed and include vitamin B-12, and cardizem.   Labs reviewed: CBGS: 162-281 (inpatient orders for glycemic control are 0-15 units insulin aspart TID with meals, 12 units insulin aspart TID with meals, and 40 units insulin glargine daily at bedtime).    Diet  Order:   Diet Order             Diet 2 gram sodium Fluid consistency: Thin  Diet effective now                   EDUCATION NEEDS:   Education needs have been addressed  Skin:  Skin Assessment: Reviewed RN Assessment  Last BM:  03/03/23  Height:   Ht Readings from Last 1 Encounters:  01/30/23 5\' 4"  (1.626 m)    Weight:   Wt Readings from Last 1 Encounters:  03/03/23 79 kg    Ideal Body Weight:  54.5 kg  BMI:  Body mass index is 29.9 kg/m.  Estimated Nutritional Needs:   Kcal:  1650-1850  Protein:  80-95 grams  Fluid:  > 1.6 L    Levada Schilling, RD, LDN, CDCES Registered Dietitian III Certified Diabetes Care and Education Specialist Please refer to [facility] for RD and/or RD on-call/weekend/after hours pager

## 2023-03-05 NOTE — Plan of Care (Signed)

## 2023-03-05 NOTE — Progress Notes (Signed)
Occupational Therapy Treatment Patient Details Name: Samantha Freeman MRN: [id number] DOB: 1935-05-18 Today's Date: 03/05/2023   History of present illness Samantha Freeman is a 87 y.o. female with medical history significant of recently diagnosed atrial fibrillation not on [facility], recently diagnosed CHF, type 2 diabetes, hypertension, hyperlipidemia, CKD stage IIIa, depression/anxiety, who presents to the ED on 02/25/23 due to atrial fibrillation.   OT comments  Pt seen for OT treatment on this date. Upon arrival to room pt supine in bed, agreeable to tx. Pt completed all bed mobility and STS t/f's with supervision. Pt completed a bath while seated at the EOB with supervision and Max A for back area and Mod A for BLE. Pt requested needing to use the bathroom. Pt ambulated to the bathroom with supervision +RW. Pt completed a toilet t/f, clothing managment, and hygiene with supervision+RW. Pt returned to bedside. Pt left supine in bed with call bell within reach and all needs met. Pt making good progress toward goals, will continue to follow POC. Discharge recommendation remains appropriate.        If plan is discharge home, recommend the following:  A little help with walking and/or transfers;A little help with bathing/dressing/bathroom;Assistance with cooking/housework;Assist for transportation;Help with stairs or ramp for entrance   Equipment Recommendations       Recommendations for Other Services      Precautions / Restrictions Precautions Precautions: Fall Restrictions Weight Bearing Restrictions: No       Mobility Bed Mobility Overal bed mobility: Needs Assistance Bed Mobility: Supine to Sit, Sit to Supine     Supine to sit: Supervision Sit to supine: Supervision     Patient Response: Cooperative  Transfers Overall transfer level: Needs assistance Equipment used: Rolling walker (2 wheels) Transfers: Sit to/from Stand Sit to Stand: Supervision                 Balance  Overall balance assessment: Modified Independent Sitting-balance support: Feet supported Sitting balance-Leahy Scale: Normal Sitting balance - Comments: Pt able to maintain seated balance at edge of bed during functional activities without LOB noted   Standing balance support: During functional activity, Bilateral upper extremity supported Standing balance-Leahy Scale: Good                             ADL either performed or assessed with clinical judgement   ADL Overall ADL's : Needs assistance/impaired                                     Functional mobility during ADLs: Supervision/safety;Set up;Moderate assistance;Maximal assistance;Rolling walker (2 wheels) General ADL Comments: Pt completed a bath while seated at the EOB with supervision and Max A for back area and Mod A for BLE. Pt ambulated to the bathroom with supervision +RW. Pt completed a toilet t/f, clothing managment, and hygiene with supervision+RW.    Extremity/Trunk Assessment Upper Extremity Assessment Upper Extremity Assessment: Generalized weakness   Lower Extremity Assessment Lower Extremity Assessment: Generalized weakness   Cervical / Trunk Assessment Cervical / Trunk Assessment: Normal    Vision Patient Visual Report: No change from baseline     Perception     Praxis      Cognition Arousal: Alert Behavior During Therapy: WFL for tasks assessed/performed Overall Cognitive Status: Within Functional Limits for tasks assessed  General Comments: Pleasant and motivated to work with OT        Exercises      Shoulder Instructions       General Comments      Pertinent Vitals/ Pain       Pain Assessment Pain Assessment: No/denies pain  Home Living                                          Prior Functioning/Environment              Frequency  Min 1X/week        Progress Toward Goals  OT  Goals(current goals can now be found in the care plan section)  Progress towards OT goals: Progressing toward goals     Plan      Co-evaluation                 AM-PAC OT "6 Clicks" Daily Activity     Outcome Measure   Help from another person eating meals?: None Help from another person taking care of personal grooming?: A Little Help from another person toileting, which includes using toliet, bedpan, or urinal?: A Little Help from another person bathing (including washing, rinsing, drying)?: A Lot Help from another person to put on and taking off regular upper body clothing?: A Little Help from another person to put on and taking off regular lower body clothing?: A Little 6 Click Score: 18    End of Session Equipment Utilized During Treatment: Rolling walker (2 wheels)  OT Visit Diagnosis: Other abnormalities of gait and mobility (R26.89);Unsteadiness on feet (R26.81);Muscle weakness (generalized) (M62.81)   Activity Tolerance Patient tolerated treatment well   Patient Left in bed;with call bell/phone within reach;with bed alarm set   Nurse Communication          Time: 1610-9604 OT Time Calculation (min): 29 min  Charges:    Butch Penny, SOT

## 2023-03-06 DIAGNOSIS — I4891 Unspecified atrial fibrillation: Secondary | ICD-10-CM | POA: Diagnosis not present

## 2023-03-06 LAB — MAGNESIUM: Magnesium: 2 mg/dL (ref 1.7–2.4)

## 2023-03-06 LAB — BASIC METABOLIC PANEL
Anion gap: 11 (ref 5–15)
BUN: 38 mg/dL — ABNORMAL HIGH (ref 8–23)
CO2: 25 mmol/L (ref 22–32)
Calcium: 9.2 mg/dL (ref 8.9–10.3)
Chloride: 100 mmol/L (ref 98–111)
Creatinine, Ser: 1.32 mg/dL — ABNORMAL HIGH (ref 0.44–1.00)
GFR, Estimated: 40 mL/min — ABNORMAL LOW (ref >60)
Glucose, Bld: 236 mg/dL — ABNORMAL HIGH (ref 70–99)
Potassium: 3.9 mmol/L (ref 3.5–5.1)
Sodium: 136 mmol/L (ref 135–145)

## 2023-03-06 LAB — GLUCOSE, CAPILLARY
Glucose-Capillary: 253 mg/dL — ABNORMAL HIGH (ref 70–99)
Glucose-Capillary: 257 mg/dL — ABNORMAL HIGH (ref 70–99)
Glucose-Capillary: 222 mg/dL — ABNORMAL HIGH (ref 70–99)
Glucose-Capillary: 253 mg/dL — ABNORMAL HIGH (ref 70–99)

## 2023-03-06 MED ORDER — METOPROLOL TARTRATE 37.5 MG PO TABS: 37.5000 mg | ORAL_TABLET | Freq: Two times a day (BID) | ORAL | 0 refills | Status: DC

## 2023-03-06 MED ORDER — ALPRAZOLAM 0.5 MG PO TABS: 0.5000 mg | ORAL_TABLET | Freq: Every day | ORAL | 0 refills | Status: AC

## 2023-03-06 MED ORDER — DILTIAZEM HCL 60 MG PO TABS: 60.0000 mg | ORAL_TABLET | Freq: Three times a day (TID) | ORAL | 0 refills | Status: DC

## 2023-03-06 MED ORDER — METOPROLOL TARTRATE 25 MG PO TABS
37.5000 mg | ORAL_TABLET | Freq: Two times a day (BID) | ORAL | Status: DC
  Administered 2023-03-06 – 2023-03-07 (×2): 37.5 mg via ORAL
  Filled 2023-03-06 (×2): qty 2

## 2023-03-06 MED ORDER — ASPIRIN 81 MG PO TBEC: 81.0000 mg | DELAYED_RELEASE_TABLET | Freq: Every day | ORAL | 0 refills | Status: DC

## 2023-03-06 NOTE — TOC Progression Note (Signed)
Transition of Care [facility]) - Progression Note    Patient Details  Name: Samantha Freeman MRN: [id number] Date of Birth: 1936/01/02  Transition of Care [facility]) CM/SW Contact  Truddie Hidden, RN Phone Number: 03/06/2023, 3:07 PM  Clinical Narrative:    Sherron Monday with Tiffany from [facility] patient was approved to admit today. She was advised Cardiology had just signed off for patient. MD notified this RNCM patient could discharge  1:58 Per Tiffany in admissions discharge summary was needed by 2pm. MD notified per Tiffany patient can admit tomorrow. A new Berkley Harvey will be required and will be started 11/14.    Expected Discharge Plan: Skilled Nursing Facility Barriers to Discharge: Continued Medical Work up  Expected Discharge Plan and Services     Post Acute Care Choice: Skilled Nursing Facility Living arrangements for the past 2 months: Apartment Expected Discharge Date: 03/06/23                                     Social Determinants of Health (SDOH) Interventions SDOH Screenings   Food Insecurity: No Food Insecurity (02/25/2023)  Housing: Low Risk  (02/25/2023)  Transportation Needs: No Transportation Needs (02/25/2023)  Utilities: Not At Risk (02/25/2023)  Tobacco Use: Low Risk  (02/25/2023)   Received from [facility]    Readmission Risk Interventions     No data to display

## 2023-03-06 NOTE — Progress Notes (Signed)
[facility] CLINIC CARDIOLOGY PROGRESS NOTE       Patient ID: Samantha Freeman MRN: [id number] DOB/AGE: [birth year] 87 y.o.  Admit date: 02/25/2023 Referring Physician Dr. Darlin Priestly Primary Physician Sparks, Duane Lope, MD  Primary Cardiologist Leanora Ivanoff, PA Reason for Consultation atrial fibrillation RVR  HPI: Samantha Freeman is a 87 y.o. female  with a past medical history of recently diagnosed atrial fibrillation, mild aortic stenosis, bradycardia, type 2 diabetes, hypercholesterolemia who presented to the ED on 02/25/2023 for generalized weakness and fatigue. Found to be in AF RVR in the ED. Cardiology was consulted for further evaluation.   Interval history: -Patient feeling well this AM, sitting upright in hospital bed without complaint.   -HR ranging from 90-110s, averaging around 100. Short paroxysms up to 120s.  -BP borderline, she denies any dizziness or lightheadedness.  Review of systems complete and found to be negative unless listed above    Past Medical History:  Diagnosis Date   Anemia    Anxiety    Aortic stenosis, mild    Arthritis    CKD (chronic kidney disease), stage III (HCC)    Complication of anesthesia    Propofol anaphylaxis   Cortical cataract    DDD (degenerative disc disease), lumbar    Depression    Dyspnea    GERD (gastroesophageal reflux disease)    Headache    Heart murmur    History of 2019 novel coronavirus disease (COVID-19) 12/01/2018   HLD (hyperlipidemia)    Hypertension    Pneumonia    Schatzki's ring    Seasonal allergies    T2DM (type 2 diabetes mellitus) (HCC)    Valvular insufficiency     Past Surgical History:  Procedure Laterality Date   ABDOMINAL HYSTERECTOMY     APPENDECTOMY     BICEPT TENODESIS Right 05/13/2018   Procedure: BICEPS TENODESIS;  Surgeon: Christena Flake, MD;  Location: [facility] ORS;  Service: Orthopedics;  Laterality: Right;   CARDIAC CATHETERIZATION     COLONOSCOPY     EYE SURGERY Left 11/2013   Corneal  transplant   EYE SURGERY Right 09/2013   Corneal transplant   JOINT REPLACEMENT Right 2015   knee   REVERSE SHOULDER ARTHROPLASTY Left 10/13/2020   Procedure: REVERSE SHOULDER ARTHROPLASTY;  Surgeon: Christena Flake, MD;  Location: [facility] ORS;  Service: Orthopedics;  Laterality: Left;   SHOULDER ARTHROSCOPY WITH ROTATOR CUFF REPAIR Right 05/13/2018   Procedure: SHOULDER ARTHROSCOPY WITH DEBRIDEMENT, DECOMPRESSION AND ROTATOR CUFF REPAIR;  Surgeon: Christena Flake, MD;  Location: [facility] ORS;  Service: Orthopedics;  Laterality: Right;   TONSILLECTOMY      Medications Prior to Admission  Medication Sig Dispense Refill Last Dose   ALPRAZolam (XANAX) 0.5 MG tablet Take 0.5 mg by mouth at bedtime.   02/24/2023   aspirin 325 MG tablet Take 325 mg by mouth daily.   02/25/2023   Calcium Carb-Cholecalciferol (CALCIUM 600/VITAMIN D3 PO) Take 1 tablet by mouth daily.   02/25/2023   Coenzyme Q10 10 MG capsule Take 10 mg by mouth every morning.   02/25/2023   dapagliflozin propanediol (FARXIGA) 10 MG TABS tablet Take 10 mg by mouth daily.   02/25/2023   enalapril (VASOTEC) 20 MG tablet Take 20 mg by mouth 2 (two) times daily.   02/25/2023   fenofibrate (TRICOR) 48 MG tablet Take 1 tablet by mouth daily.   02/25/2023   furosemide (LASIX) 20 MG tablet Take 20 mg by mouth daily as needed for fluid or edema.  prn at unknown   gabapentin (NEURONTIN) 100 MG capsule Take 200 mg by mouth 3 (three) times daily.   02/25/2023   hydrOXYzine (ATARAX) 25 MG tablet Take 1 tablet by mouth 2 (two) times daily.   02/25/2023   insulin lispro (HUMALOG) 100 UNIT/ML injection Inject 7-19 Units into the skin 3 (three) times daily before meals. Blood Glucose level: 140-199 - 7 units, 200-250 - 9 units, 251-299 - 13 units,  300-349 - 17 units,  350 or above 19 units.   02/25/2023   LANTUS SOLOSTAR 100 UNIT/ML Solostar Pen Inject 36 Units into the skin at bedtime.   02/24/2023   magnesium oxide (MAG-OX) 400 MG tablet Take 800 mg by mouth 3 (three)  times daily.   02/25/2023   metoprolol tartrate (LOPRESSOR) 50 MG tablet Take 1 tablet by mouth 2 (two) times daily.   02/25/2023   Multiple Vitamin (MULTIVITAMIN) tablet Take 1 tablet by mouth daily. Women's Daily Multivitamin   02/25/2023   pantoprazole (PROTONIX) 40 MG tablet Take 1 tablet (40 mg total) by mouth daily.   02/25/2023   prednisoLONE acetate (PRED FORTE) 1 % ophthalmic suspension Place 1 drop into both eyes at bedtime.   02/24/2023   rosuvastatin (CRESTOR) 10 MG tablet Take 10 mg by mouth daily.   02/24/2023   traZODone (DESYREL) 50 MG tablet Take 50 mg by mouth at bedtime.   prn at unknown   vitamin B-12 (CYANOCOBALAMIN) 1000 MCG tablet Take 1,000 mcg by mouth daily.   02/24/2023   blood glucose meter kit and supplies KIT Dispense based on patient and insurance preference. Use up to four times daily as directed. (FOR ICD-9 250.00, 250.01). For QAC - HS accuchecks. 1 each 1    diltiazem (CARDIZEM CD) 180 MG 24 hr capsule Take 1 capsule (180 mg total) by mouth daily.      ferrous sulfate 325 (65 FE) MG tablet Take 325 mg by mouth daily with breakfast. (Patient not taking: Reported on 02/25/2023)   Not Taking   glucose blood (FREESTYLE LITE) test strip For glucose testing every before meals at bedtime. Diagnosis E 11.65  Can substitute to any accepted brand 100 each 0    Insulin Syringe-Needle U-100 25G X 1" 1 ML MISC For 4 times a day insulin SQ, 1 month supply. Diagnosis E11.65 30 each 0    PARoxetine (PAXIL) 10 MG tablet Take 10 mg by mouth daily.      Social History   Socioeconomic History   Marital status: Widowed    Spouse name: Not on file   Number of children: Not on file   Years of education: Not on file   Highest education level: Not on file  Occupational History   Not on file  Tobacco Use   Smoking status: Never   Smokeless tobacco: Never  Vaping Use   Vaping status: Never Used  Substance and Sexual Activity   Alcohol use: Never   Drug use: Never   Sexual activity:  Not Currently  Other Topics Concern   Not on file  Social History Narrative   Lives alone, has support from son who is a Optician, dispensing and daughter in Social worker. Will be with mother when she has surgery.   Social Determinants of Health   Financial Resource Strain: Not on file  Food Insecurity: No Food Insecurity (02/25/2023)   Hunger Vital Sign    Worried About Running Out of Food in the Last Year: Never true    Ran Out of  Food in the Last Year: Never true  Transportation Needs: No Transportation Needs (02/25/2023)   PRAPARE - Administrator, Civil Service (Medical): No    Lack of Transportation (Non-Medical): No  Physical Activity: Not on file  Stress: Not on file  Social Connections: Not on file  Intimate Partner Violence: Not At Risk (02/25/2023)   Humiliation, Afraid, Rape, and Kick questionnaire    Fear of Current or Ex-Partner: No    Emotionally Abused: No    Physically Abused: No    Sexually Abused: No    Family History  Problem Relation Age of Onset   Breast cancer Paternal Aunt      Vitals:   03/06/23 0408 03/06/23 0410 03/06/23 0618 03/06/23 0700  BP: (!) 99/57 103/69 106/71   Pulse: (!) 108     Resp:      Temp: 98.7 F (37.1 C)   98.1 F (36.7 C)  TempSrc: Oral   Oral  SpO2: 94%     Weight:        PHYSICAL EXAM General: Well-appearing, well nourished, in no acute distress sitting upright in hospital bed. HEENT: Normocephalic and atraumatic. Neck: No JVD.  Lungs: Normal respiratory effort on room air. Clear bilaterally to auscultation. No wheezes, crackles, rhonchi.  Heart: Irregularly irregular. Normal S1 and S2 without gallops or murmurs.  Abdomen: Non-distended appearing.  Msk: Normal strength and tone for age. Extremities: Warm and well perfused. No clubbing, cyanosis.  No edema.  Neuro: Alert and oriented X 3. Psych: Answers questions appropriately.   Labs: Basic Metabolic Panel: Recent Labs    03/05/23 0304 03/06/23 0401  NA 137 136  K 4.0  3.9  CL 99 100  CO2 28 25  GLUCOSE 192* 236*  BUN 43* 38*  CREATININE 1.41* 1.32*  CALCIUM 9.3 9.2  MG 2.0 2.0   Liver Function Tests: No results for input(s): "AST", "ALT", "ALKPHOS", "BILITOT", "PROT", "ALBUMIN" in the last 72 hours. No results for input(s): "LIPASE", "AMYLASE" in the last 72 hours. CBC: Recent Labs    03/04/23 0320  WBC 6.5  HGB 14.9  HCT 45.0  MCV 89.8  PLT 229   Cardiac Enzymes: No results for input(s): "CKTOTAL", "CKMB", "CKMBINDEX", "TROPONINIHS" in the last 72 hours.  BNP: No results for input(s): "BNP" in the last 72 hours. D-Dimer: No results for input(s): "DDIMER" in the last 72 hours. Hemoglobin A1C: No results for input(s): "HGBA1C" in the last 72 hours. Fasting Lipid Panel: No results for input(s): "CHOL", "HDL", "LDLCALC", "TRIG", "CHOLHDL", "LDLDIRECT" in the last 72 hours. Thyroid Function Tests: No results for input(s): "TSH", "T4TOTAL", "T3FREE", "THYROIDAB" in the last 72 hours.  Invalid input(s): "FREET3" Anemia Panel: No results for input(s): "VITAMINB12", "FOLATE", "FERRITIN", "TIBC", "IRON", "RETICCTPCT" in the last 72 hours.   Radiology: CT Angio Chest PE W/Cm &/Or Wo Cm  Result Date: 02/25/2023 CLINICAL DATA:  Pulmonary embolism (PE) suspected, high prob. Weakness. Shortness of breath. EXAM: CT ANGIOGRAPHY CHEST WITH CONTRAST TECHNIQUE: Multidetector CT imaging of the chest was performed using the standard protocol during bolus administration of intravenous contrast. Multiplanar CT image reconstructions and MIPs were obtained to evaluate the vascular anatomy. RADIATION DOSE REDUCTION: This exam was performed according to the departmental dose-optimization program which includes automated exposure control, adjustment of the mA and/or kV according to patient size and/or use of iterative reconstruction technique. CONTRAST:  75mL OMNIPAQUE IOHEXOL 350 MG/ML SOLN COMPARISON:  CT angiography chest from 01/30/2023. FINDINGS:  Cardiovascular: No evidence of embolism  to the proximal subsegmental pulmonary artery level. Mild cardiomegaly. No pericardial effusion. No aortic aneurysm. There are coronary artery calcifications, in keeping with coronary artery disease. There are also mild-to-moderate peripheral atherosclerotic vascular calcifications of thoracic aorta and its major branches. There is dilation of the main pulmonary trunk measuring up to 3.3 cm, which is nonspecific but can be seen with pulmonary artery hypertension. Mediastinum/Nodes: Visualized thyroid gland appears grossly unremarkable. No solid / cystic mediastinal masses. The esophagus is nondistended precluding optimal assessment. There are few mildly prominent mediastinal and hilar lymph nodes, which do not meet the size criteria for lymphadenopathy and appear grossly similar to the prior study, favoring benign etiology. No axillary lymphadenopathy by size criteria. Lungs/Pleura: The central tracheo-bronchial tree is patent. There is mild, smooth, circumferential thickening of the segmental and subsegmental bronchial walls, throughout bilateral lungs, which is nonspecific. Findings are most commonly seen with pulmonary edema, bronchitis or reactive airway disease, such as asthma. Redemonstration of filling defects in the bilateral lower lobe subsegmental bronchi, likely due to mucus/secretions or aspiration. There are patchy areas of smooth interlobular septal thickening throughout bilateral lungs and bilateral trace pleural effusions. There are patchy areas of linear, plate-like atelectasis and/or scarring throughout bilateral lungs. No mass or consolidation. No pneumothorax. No suspicious lung nodules. Upper Abdomen: Visualized upper abdominal viscera within normal limits. Musculoskeletal: The visualized soft tissues of the chest wall are grossly unremarkable. No suspicious osseous lesions. There are mild multilevel degenerative changes in the visualized spine. Review of  the MIP images confirms the above findings. IMPRESSION: *No evidence of pulmonary embolism to the proximal subsegmental pulmonary artery level. *There is mild smooth interlobular septal thickening throughout bilateral lungs with bilateral trace pleural effusions favoring congestive heart failure/pulmonary edema. *Multiple other nonacute observations, as described above. Electronically Signed   By: Jules Schick M.D.   On: 02/25/2023 14:34   DG Chest Port 1 View  Result Date: 02/25/2023 CLINICAL DATA:  Shortness of breath. EXAM: PORTABLE CHEST 1 VIEW COMPARISON:  Sep 15, 2019. FINDINGS: Mild cardiomegaly is noted. Mild central pulmonary vascular congestion is noted. Minimal bibasilar pulmonary edema is noted. Status post left shoulder arthroplasty. IMPRESSION: Mild cardiomegaly with mild central pulmonary vascular congestion and minimal bibasilar pulmonary edema. Electronically Signed   By: Lupita Raider M.D.   On: 02/25/2023 13:22    ECHO 01/2023: 1. Left ventricular ejection fraction, by estimation, is 60 to 65%. The left ventricle has normal function. The left ventricle has no regional wall motion abnormalities. There is moderate left ventricular hypertrophy. Indeterminate diastolic filling due   to E-A fusion.   2. Right ventricular systolic function is normal. The right ventricular size is normal.   3. Left atrial size was moderately dilated.   4. Right atrial size was mildly dilated.   5. The mitral valve is normal in structure. Mild mitral valve regurgitation. No evidence of mitral stenosis.   6. The aortic valve is normal in structure. Aortic valve regurgitation is  not visualized. Mild aortic valve stenosis.   7. The inferior vena cava is normal in size with greater than 50%  respiratory variability, suggesting right atrial pressure of 3 mmHg.   TELEMETRY reviewed by me 03/06/2023: atrial fibrillation rate 100s  EKG reviewed by me: AF RVR rate 116 bpm  Data reviewed by me 03/06/2023:  last 24h vitals tele labs imaging I/O hospitalist progress note  Principal Problem:   Atrial fibrillation with RVR (HCC) Active Problems:   Chronic kidney disease, stage 3a (HCC)  DM (diabetes mellitus) (HCC)   HTN (hypertension)   Acute hypoxic respiratory failure (HCC)   Anxiety and depression   Acute heart failure with preserved ejection fraction (HFpEF) (HCC)   Generalized weakness    ASSESSMENT AND PLAN:  Samantha Freeman is a 87 y.o. female  with a past medical history of recently diagnosed atrial fibrillation, mild aortic stenosis, bradycardia, type 2 diabetes, hypercholesterolemia who presented to the ED on 02/25/2023 for generalized weakness and fatigue. Found to be in AF RVR in the ED. Cardiology was consulted for further evaluation.   # Atrial fibrillation RVR # Recently diagnosed atrial fibrillation Patient presented with worsening weakness and fatigue. Found to be in AF RVR in the ED. Was diagnosed with AF during admission 10/9-10/14. Initially started on eliquis but this has been held due to epistaxis requiring packing. Rate controlled 11/8 but over the weekend had issues with this becoming elevated.  -Continue diltiazem to 60 mg q8 hours. Increase metoprolol to 37.5 mg twice daily. Adjustments to doses pending BP and HR.  -Home enalapril and amlodipine discontinued due to borderline BP on rate control medications.  -Patient declines resuming anticoagulation given recent nosebleeds. Educated on risks associated with atrial fibrillation and stroke.  -Not a candidate at this time for cardioversion or antiarrhythmic medications due to risk of stroke given she refuses anticoagulation.  # Acute on chronic HFpEF (EF 60-65% in 01/2023) # Acute hypoxic respiratory failure Patient with SOB prior to admission. BNP 517. S/p IV diuresis. Poorly documented output but weight down nearly 10 kg since 11/4. SOB improved overall and appears euvolemic. -Continue farxiga.  -Continue home lasix  prn on discharge.   # Hyperlipidemia -Continue rosuvastatin 10 mg daily and aspirin 81 mg daily.   This patient's plan of care was discussed and created with Dr. Juliann Pares and he is in agreement.  Signed: Gale Journey, PA-C  03/06/2023, 12:02 PM [facility]

## 2023-03-06 NOTE — Plan of Care (Signed)

## 2023-03-06 NOTE — Plan of Care (Signed)
  Problem: Coping: Goal: Ability to adjust to condition or change in health will improve Outcome: Progressing   Problem: Health Behavior/Discharge Planning: Goal: Ability to identify and utilize available resources and services will improve Outcome: Progressing Goal: Ability to manage health-related needs will improve Outcome: Progressing   Problem: Nutritional: Goal: Maintenance of adequate nutrition will improve Outcome: Progressing   Problem: Skin Integrity: Goal: Risk for impaired skin integrity will decrease Outcome: Progressing   Problem: Tissue Perfusion: Goal: Adequacy of tissue perfusion will improve Outcome: Progressing

## 2023-03-06 NOTE — Discharge Summary (Addendum)
Physician Discharge Summary  Samantha Freeman ZOX:[id number] DOB: 1935-11-09 DOA: 02/25/2023  PCP: Samantha Arbour, MD  Admit date: 02/25/2023 Discharge date: 03/12/23  Admitted From: SNF Disposition:  SNF  Recommendations for Outpatient Follow-up:  Follow up with PCP in 1-2 weeks F/u w/ Freeman, Samantha Freeman, in 1-2 weeks  Home Health: no  Equipment/Devices:  Discharge Condition: stable  CODE STATUS: full  Diet recommendation: Heart Healthy / Carb Modified  Brief/Interim Summary: HPI was taken from Samantha Freeman: Samantha Freeman is a 87 y.o. female with medical history significant of recently diagnosed atrial fibrillation not on [facility], recently diagnosed CHF, type 2 diabetes, hypertension, hyperlipidemia, CKD stage IIIa, depression/anxiety, who presents to the ED due to atrial fibrillation.   Samantha Freeman states she has been experiencing generalized weakness, fatigue, DOE and orthopnea for the last few weeks, but her symptoms significantly worsened over the last 1-2 days.  She notes she occasionally experiences shortness of breath at rest but this comes and goes; when she experiences shortness of breath, it is sudden onset.  She endorses some chest rightness when shortness of breath starts but denies any at this time.  She denies any palpitations, nausea, vomiting, fever, chills.  She notes a poor appetite but denies any abdominal distention, weight loss or acute weight gain.   ED course: On arrival to the ED, patient was normotensive at 130/104 with heart rate 113.  She was saturating at 92% on room air but subsequently desaturated to 88% and required 2 L of supplemental oxygen with improvement to 91%.  She was afebrile 98.6.Initial workup demonstrates WBC of 11.1, BUN of 28, creatinine 0.99 with GFR 56.  BNP elevated at 512.  Urinalysis with trace leukocytes, glucosuria but no WBC or bacteria.  CTA was obtained that demonstrated no evidence of PE, however changes favoring congestive heart  failure/pulmonary edema.  Patient started on diltiazem, Lasix and TRH contacted for admission.  Discharge Diagnoses:  Principal Problem:   Atrial fibrillation with RVR (HCC) Active Problems:   Acute hypoxic respiratory failure (HCC)   Acute heart failure with preserved ejection fraction (HFpEF) (HCC)   Chronic kidney disease, stage 3a (HCC)   DM (diabetes mellitus) (HCC)   HTN (hypertension)   Anxiety and depression   Generalized weakness   Atrial fibrillation: with RVR. Presenting with ongoing weakness, fatigue, DOB with acute worsening over the last 1-2 days.  Patient stopped anticoagulation after developing epistaxis requiring packing, and declined anticoagulation. Afib was first dx during Oct hospitalization. Continue on diltiazem, metoprolol, digoxin as per Freeman. D/c enalapril, amlodipine as per Freeman   Acute hypoxic respiratory failure: has mild pulmonary edema likely triggered by atrial fibrillation with RVR.  CTA without PE.  No signs of infectious process. S/p IV lasix 40 daily x 4. Pt reported her baseline O2 sats are low, and denied dyspnea or need for supplemental oxygen. Weaned off of supplemental oxygen    Acute heart failure with preserved ejection fraction: likely triggered by atrial fibrillation with RVR.  BNP is elevated at 512, higher than previous admission. S/p IV lasix.  Continue on farxiga, metoprolol   Generalized weakness:  PT/OT recs SNF   Depression: severity unknown. Continue on home dose of trazodone, paroxetine   Anxiety: severity unknown. Continue on home dose of xanax    HTN: continue on metoprolol. D/c amlodipine, enalapril as per Freeman    DM2: A1c 7.9, poorly controlled. Continue on farxiga, insulin    AKI on CKDIIIa: Cr is labile. Avoid nephrotoxic meds  Hypokalemia: WNL today    Hyponatremia: resolved  Obesity: BMI 29.9. Would benefit from weight loss  Discharge Instructions  Discharge Instructions     Diet - low sodium heart  healthy   Complete by: As directed    Diet Carb Modified   Complete by: As directed    Discharge instructions   Complete by: As directed    F/u w/ Freeman, Dr. Juliann Freeman, in 1-2 weeks. F/u w/ PCP in 1-2 weeks   Discharge instructions   Complete by: As directed    F/u w/ Freeman, Samantha Freeman, in 1-2 weeks. F/u w/ PCP in 1-2 weeks   Increase activity slowly   Complete by: As directed    Increase activity slowly   Complete by: As directed       Allergies as of 03/12/2023       Reactions   Buspirone    Other Reaction(s): Dizziness   Propofol Anaphylaxis   Zolpidem Other (See Comments)   Hallucinations   Cholestyramine Itching, Rash   Codeine Nausea Only   Hydrochlorothiazide Other (See Comments)   PATIENT DOES NOT REMEMBER   Loratadine Other (See Comments)   PATIENT DOES NOT REMEMBER   Niacin Rash        Medication List     STOP taking these medications    ALPRAZolam 0.5 MG tablet Commonly known as: XANAX   aspirin 325 MG tablet Replaced by: aspirin EC 81 MG tablet   enalapril 20 MG tablet Commonly known as: VASOTEC       TAKE these medications    aspirin EC 81 MG tablet Take 1 tablet (81 mg total) by mouth daily. Swallow whole. Replaces: aspirin 325 MG tablet   blood glucose meter kit and supplies Kit Dispense based on patient and insurance preference. Use up to four times daily as directed. (FOR ICD-9 250.00, 250.01). For QAC - HS accuchecks.   CALCIUM 600/VITAMIN D3 PO Take 1 tablet by mouth daily.   Coenzyme Q10 10 MG capsule Take 10 mg by mouth every morning.   cyanocobalamin 1000 MCG tablet Commonly known as: VITAMIN B12 Take 1,000 mcg by mouth daily.   dapagliflozin propanediol 10 MG Tabs tablet Commonly known as: FARXIGA Take 10 mg by mouth daily.   digoxin 0.125 MG tablet Commonly known as: LANOXIN Take 1 tablet (0.125 mg total) by mouth every other day. Start taking on: March 14, 2023   diltiazem 180 MG 24 hr capsule Commonly known  as: CARDIZEM CD Take 1 capsule (180 mg total) by mouth daily. Start taking on: March 13, 2023   fenofibrate 48 MG tablet Commonly known as: TRICOR Take 1 tablet by mouth daily.   ferrous sulfate 325 (65 FE) MG tablet Take 325 mg by mouth daily with breakfast.   FREESTYLE LITE test strip Generic drug: glucose blood For glucose testing every before meals at bedtime. Diagnosis E 11.65  Can substitute to any accepted brand   furosemide 20 MG tablet Commonly known as: LASIX Take 20 mg by mouth daily as needed for fluid or edema.   gabapentin 100 MG capsule Commonly known as: NEURONTIN Take 200 mg by mouth 3 (three) times daily.   hydrOXYzine 25 MG tablet Commonly known as: ATARAX Take 1 tablet by mouth 2 (two) times daily.   insulin lispro 100 UNIT/ML injection Commonly known as: HUMALOG Inject 7-19 Units into the skin 3 (three) times daily before meals. Blood Glucose level: 140-199 - 7 units, 200-250 - 9 units, 251-299 - 13 units,  300-349 - 17 units,  350 or above 19 units.   Insulin Syringe-Needle U-100 25G X 1" 1 ML Misc For 4 times a day insulin SQ, 1 month supply. Diagnosis E11.65   Lantus SoloStar 100 UNIT/ML Solostar Pen Generic drug: insulin glargine Inject 36 Units into the skin at bedtime.   magnesium oxide 400 MG tablet Commonly known as: MAG-OX Take 800 mg by mouth 3 (three) times daily.   metoprolol tartrate 50 MG tablet Commonly known as: LOPRESSOR Take 1 tablet (50 mg total) by mouth 2 (two) times daily.   multivitamin tablet Take 1 tablet by mouth daily. Women's Daily Multivitamin   pantoprazole 40 MG tablet Commonly known as: PROTONIX Take 1 tablet (40 mg total) by mouth daily.   PARoxetine 10 MG tablet Commonly known as: PAXIL Take 10 mg by mouth daily.   prednisoLONE acetate 1 % ophthalmic suspension Commonly known as: PRED FORTE Place 1 drop into both eyes at bedtime.   rosuvastatin 10 MG tablet Commonly known as: CRESTOR Take 10 mg  by mouth daily.   traZODone 50 MG tablet Commonly known as: DESYREL Take 50 mg by mouth at bedtime.       ASK your doctor about these medications    ALPRAZolam 0.5 MG tablet Commonly known as: XANAX Take 1 tablet (0.5 mg total) by mouth at bedtime for 1 day. Ask about: Should I take this medication?        Contact information for follow-up providers     Sparks, Duane Lope, MD. Schedule an appointment as soon as possible for a visit .   Specialty: Internal Medicine Contact information: [address] [facility] [place] [address] [phone]         Marcina Millard, MD. Go in 1 week(s).   Specialty: Cardiology Why: Has appointment scheduled with Samantha Freeman on 12/17. Contact information: [address] [facility] [place] [address] [phone]              Contact information for after-discharge care     Destination     [facility] Preferred [facility] .   Service: Skilled Nursing Contact information: [address] [place] [address] [phone]                    Allergies  Allergen Reactions   Buspirone     Other Reaction(s): Dizziness   Propofol Anaphylaxis   Zolpidem Other (See Comments)    Hallucinations   Cholestyramine Itching and Rash   Codeine Nausea Only   Hydrochlorothiazide Other (See Comments)    PATIENT DOES NOT REMEMBER   Loratadine Other (See Comments)    PATIENT DOES NOT REMEMBER   Niacin Rash    Consultations: Freeman    Procedures/Studies: CT Angio Chest PE W/Cm &/Or Wo Cm  Result Date: 02/25/2023 CLINICAL DATA:  Pulmonary embolism (PE) suspected, high prob. Weakness. Shortness of breath. EXAM: CT ANGIOGRAPHY CHEST WITH CONTRAST TECHNIQUE: Multidetector CT imaging of the chest was performed using the standard protocol during bolus administration of intravenous  contrast. Multiplanar CT image reconstructions and MIPs were obtained to evaluate the vascular anatomy. RADIATION DOSE REDUCTION: This exam was performed according to the departmental dose-optimization program which includes automated exposure control, adjustment of the mA and/or kV according to patient size and/or use of iterative reconstruction technique. CONTRAST:  75mL OMNIPAQUE IOHEXOL 350 MG/ML SOLN COMPARISON:  CT angiography chest from 01/30/2023. FINDINGS:  Cardiovascular: No evidence of embolism to the proximal subsegmental pulmonary artery level. Mild cardiomegaly. No pericardial effusion. No aortic aneurysm. There are coronary artery calcifications, in keeping with coronary artery disease. There are also mild-to-moderate peripheral atherosclerotic vascular calcifications of thoracic aorta and its major branches. There is dilation of the main pulmonary trunk measuring up to 3.3 cm, which is nonspecific but can be seen with pulmonary artery hypertension. Mediastinum/Nodes: Visualized thyroid gland appears grossly unremarkable. No solid / cystic mediastinal masses. The esophagus is nondistended precluding optimal assessment. There are few mildly prominent mediastinal and hilar lymph nodes, which do not meet the size criteria for lymphadenopathy and appear grossly similar to the prior study, favoring benign etiology. No axillary lymphadenopathy by size criteria. Lungs/Pleura: The central tracheo-bronchial tree is patent. There is mild, smooth, circumferential thickening of the segmental and subsegmental bronchial walls, throughout bilateral lungs, which is nonspecific. Findings are most commonly seen with pulmonary edema, bronchitis or reactive airway disease, such as asthma. Redemonstration of filling defects in the bilateral lower lobe subsegmental bronchi, likely due to mucus/secretions or aspiration. There are patchy areas of smooth interlobular septal thickening throughout bilateral lungs and bilateral  trace pleural effusions. There are patchy areas of linear, plate-like atelectasis and/or scarring throughout bilateral lungs. No mass or consolidation. No pneumothorax. No suspicious lung nodules. Upper Abdomen: Visualized upper abdominal viscera within normal limits. Musculoskeletal: The visualized soft tissues of the chest wall are grossly unremarkable. No suspicious osseous lesions. There are mild multilevel degenerative changes in the visualized spine. Review of the MIP images confirms the above findings. IMPRESSION: *No evidence of pulmonary embolism to the proximal subsegmental pulmonary artery level. *There is mild smooth interlobular septal thickening throughout bilateral lungs with bilateral trace pleural effusions favoring congestive heart failure/pulmonary edema. *Multiple other nonacute observations, as described above. Electronically Signed   By: Jules Schick M.D.   On: 02/25/2023 14:34   DG Chest Port 1 View  Result Date: 02/25/2023 CLINICAL DATA:  Shortness of breath. EXAM: PORTABLE CHEST 1 VIEW COMPARISON:  Sep 15, 2019. FINDINGS: Mild cardiomegaly is noted. Mild central pulmonary vascular congestion is noted. Minimal bibasilar pulmonary edema is noted. Status post left shoulder arthroplasty. IMPRESSION: Mild cardiomegaly with mild central pulmonary vascular congestion and minimal bibasilar pulmonary edema. Electronically Signed   By: Lupita Raider M.D.   On: 02/25/2023 13:22   (Echo, Carotid, EGD, Colonoscopy, ERCP)    Subjective:   Discharge Exam: Vitals:   03/12/23 0755 03/12/23 1155  BP: 120/86 112/75  Pulse: 77 77  Resp:    Temp: 98.3 F (36.8 C) 98.8 F (37.1 C)  SpO2: 93% 93%   Vitals:   03/11/23 2325 03/12/23 0353 03/12/23 0755 03/12/23 1155  BP:  120/69 120/86 112/75  Pulse:  75 77 77  Resp: (!) 22 (!) 22    Temp:  97.7 F (36.5 C) 98.3 F (36.8 C) 98.8 F (37.1 C)  TempSrc:      SpO2:  92% 93% 93%  Weight:      Height:        General: Pt is alert,  awake, not in acute distress Cardiovascular: RRR, S1/S2 +, no rubs, no gallops Respiratory: CTA bilaterally, no wheezing, no rhonchi Abdominal: Soft, NT, ND, bowel sounds + Extremities: no edema, no cyanosis    The results of significant diagnostics from this hospitalization (including imaging, microbiology, ancillary and laboratory) are listed below for reference.     Microbiology: No results found for this or any previous visit (from  the past 240 hour(s)).   Labs: BNP (last 3 results) Recent Labs    01/30/23 0837 01/31/23 0506 02/25/23 1049  BNP 289.9* 456.1* 512.7*   Basic Metabolic Panel: Recent Labs  Lab 03/06/23 0401 03/07/23 0418 03/08/23 0342 03/09/23 0409 03/12/23 0424  NA 136 136 137 137 138  K 3.9 3.6 3.6 3.7 4.2  CL 100 100 103 100 101  CO2 25 24 22 24 26   GLUCOSE 236* 174* 90 147* 189*  BUN 38* 35* 37* 39* 40*  CREATININE 1.32* 1.01* 1.12* 1.12* 1.48*  CALCIUM 9.2 9.3 9.4 9.4 9.4  MG 2.0 1.8 1.8 2.0  --    Liver Function Tests: No results for input(s): "AST", "ALT", "ALKPHOS", "BILITOT", "PROT", "ALBUMIN" in the last 168 hours. No results for input(s): "LIPASE", "AMYLASE" in the last 168 hours. No results for input(s): "AMMONIA" in the last 168 hours. CBC: No results for input(s): "WBC", "NEUTROABS", "HGB", "HCT", "MCV", "PLT" in the last 168 hours.  Cardiac Enzymes: No results for input(s): "CKTOTAL", "CKMB", "CKMBINDEX", "TROPONINI" in the last 168 hours. BNP: Invalid input(s): "POCBNP" CBG: Recent Labs  Lab 03/11/23 1151 03/11/23 1627 03/11/23 2041 03/12/23 0757 03/12/23 1158  GLUCAP 315* 168* 180* 215* 205*   D-Dimer No results for input(s): "DDIMER" in the last 72 hours. Hgb A1c No results for input(s): "HGBA1C" in the last 72 hours. Lipid Profile No results for input(s): "CHOL", "HDL", "LDLCALC", "TRIG", "CHOLHDL", "LDLDIRECT" in the last 72 hours. Thyroid function studies No results for input(s): "TSH", "T4TOTAL", "T3FREE",  "THYROIDAB" in the last 72 hours.  Invalid input(s): "FREET3" Anemia work up No results for input(s): "VITAMINB12", "FOLATE", "FERRITIN", "TIBC", "IRON", "RETICCTPCT" in the last 72 hours. Urinalysis    Component Value Date/Time   COLORURINE YELLOW (A) 02/25/2023 1205   APPEARANCEUR CLEAR (A) 02/25/2023 1205   APPEARANCEUR Hazy 01/28/2012 0923   LABSPEC 1.020 02/25/2023 1205   LABSPEC 1.017 01/28/2012 0923   PHURINE 6.0 02/25/2023 1205   GLUCOSEU >=500 (A) 02/25/2023 1205   GLUCOSEU >=500 01/28/2012 0923   HGBUR NEGATIVE 02/25/2023 1205   BILIRUBINUR NEGATIVE 02/25/2023 1205   KETONESUR NEGATIVE 02/25/2023 1205   PROTEINUR NEGATIVE 02/25/2023 1205   NITRITE NEGATIVE 02/25/2023 1205   LEUKOCYTESUR TRACE (A) 02/25/2023 1205   LEUKOCYTESUR Trace 01/28/2012 0923   Sepsis Labs No results for input(s): "WBC" in the last 168 hours.  Invalid input(s): "PROCALCITONIN", "LACTICIDVEN"  Microbiology No results found for this or any previous visit (from the past 240 hour(s)).   Time coordinating discharge: Over 30 minutes  SIGNED:   Charise Killian, MD  [facility] 03/12/2023, 1:14 PM Pager   If 7PM-7AM, please contact night-coverage [email or web address]

## 2023-03-07 DIAGNOSIS — I4891 Unspecified atrial fibrillation: Secondary | ICD-10-CM | POA: Diagnosis not present

## 2023-03-07 LAB — GLUCOSE, CAPILLARY
Glucose-Capillary: 225 mg/dL — ABNORMAL HIGH (ref 70–99)
Glucose-Capillary: 285 mg/dL — ABNORMAL HIGH (ref 70–99)
Glucose-Capillary: 257 mg/dL — ABNORMAL HIGH (ref 70–99)
Glucose-Capillary: 132 mg/dL — ABNORMAL HIGH (ref 70–99)

## 2023-03-07 LAB — BASIC METABOLIC PANEL
Anion gap: 12 (ref 5–15)
BUN: 35 mg/dL — ABNORMAL HIGH (ref 8–23)
CO2: 24 mmol/L (ref 22–32)
Calcium: 9.3 mg/dL (ref 8.9–10.3)
Chloride: 100 mmol/L (ref 98–111)
Creatinine, Ser: 1.01 mg/dL — ABNORMAL HIGH (ref 0.44–1.00)
GFR, Estimated: 55 mL/min — ABNORMAL LOW (ref >60)
Glucose, Bld: 174 mg/dL — ABNORMAL HIGH (ref 70–99)
Potassium: 3.6 mmol/L (ref 3.5–5.1)
Sodium: 136 mmol/L (ref 135–145)

## 2023-03-07 LAB — MAGNESIUM: Magnesium: 1.8 mg/dL (ref 1.7–2.4)

## 2023-03-07 MED ORDER — DIGOXIN 0.25 MG/ML IJ SOLN
0.5000 mg | Freq: Every day | INTRAMUSCULAR | Status: AC
  Administered 2023-03-07: 0.5 mg via INTRAVENOUS
  Filled 2023-03-07: qty 2

## 2023-03-07 MED ORDER — DIGOXIN 125 MCG PO TABS
0.1250 mg | ORAL_TABLET | ORAL | Status: DC
  Administered 2023-03-08 – 2023-03-12 (×3): 0.125 mg via ORAL
  Filled 2023-03-07 (×3): qty 1

## 2023-03-07 MED ORDER — METOPROLOL TARTRATE 50 MG PO TABS
50.0000 mg | ORAL_TABLET | Freq: Two times a day (BID) | ORAL | Status: DC
  Administered 2023-03-07 – 2023-03-12 (×10): 50 mg via ORAL
  Filled 2023-03-07 (×10): qty 1

## 2023-03-07 MED ORDER — DIGOXIN 0.25 MG/ML IJ SOLN
0.2500 mg | Freq: Every day | INTRAMUSCULAR | Status: AC
  Administered 2023-03-08: 0.25 mg via INTRAVENOUS
  Filled 2023-03-07: qty 2

## 2023-03-07 MED ORDER — DIGOXIN 0.25 MG/ML IJ SOLN
0.2500 mg | Freq: Every day | INTRAMUSCULAR | Status: AC
  Administered 2023-03-07: 0.25 mg via INTRAVENOUS
  Filled 2023-03-07: qty 2

## 2023-03-07 NOTE — Progress Notes (Signed)
Occupational Therapy Treatment Patient Details Name: Samantha Freeman MRN: [id number] DOB: 1935-09-01 Today's Date: 03/07/2023   History of present illness Samantha Freeman is a 87 y.o. female with medical history significant of recently diagnosed atrial fibrillation not on [facility], recently diagnosed CHF, type 2 diabetes, hypertension, hyperlipidemia, CKD stage IIIa, depression/anxiety, who presents to the ED on 02/25/23 due to atrial fibrillation.   OT comments  Pt seen for OT treatment on this date. Upon arrival to room pt seated in chair, agreeable to tx. Pt completed STS t/f's with CGA +RW. Pt ambulated to the sink and completed grooming tasks (washing face and oral care) in standing with supervision +RW. Pt requested needing to use bathroom. Pt ambulated to bathroom and completed a toilet t/f with supervision +RW. Pt completed clothing management and hygiene with lateral leans and SUP +RW. Pt required cueing for hand placement and proper technique of RW. Pt returned to chair with lunch tray with call bell within reach and all needs met. Pt making good progress toward goals, will continue to follow POC. Discharge recommendation remains appropriate.        If plan is discharge home, recommend the following:  A little help with walking and/or transfers;A little help with bathing/dressing/bathroom;Assistance with cooking/housework;Assist for transportation;Help with stairs or ramp for entrance   Equipment Recommendations  Other (comment) (Defer to next venue of care.)    Recommendations for Other Services      Precautions / Restrictions Precautions Precautions: Fall Restrictions Weight Bearing Restrictions: No       Mobility Bed Mobility                 Patient Response: Cooperative  Transfers Overall transfer level: Needs assistance Equipment used: Rolling walker (2 wheels) Transfers: Sit to/from Stand Sit to Stand: Contact guard assist                 Balance Overall  balance assessment: Needs assistance Sitting-balance support: Feet supported Sitting balance-Leahy Scale: Good Sitting balance - Comments: Pt able to maintain seated balance at edge of bed during functional activities without LOB noted   Standing balance support: During functional activity, Bilateral upper extremity supported Standing balance-Leahy Scale: Fair                             ADL either performed or assessed with clinical judgement   ADL                                       Functional mobility during ADLs: Supervision/safety;Set up;Rolling walker (2 wheels);Cueing for safety General ADL Comments: Pt completed grooming tasks (washing face and oral care) standing at the sink in room with supervision +RW. Pt requested needing to use bathroom. Pt ambulated to bathroom and completed a toilet t/f with supervision +RW. Pt completed clothing management and hygiene with lateral leans and SUP +RW. Pt required cueing for hand placement and proper technique of RW.    Extremity/Trunk Assessment Upper Extremity Assessment Upper Extremity Assessment: Generalized weakness   Lower Extremity Assessment Lower Extremity Assessment: Generalized weakness   Cervical / Trunk Assessment Cervical / Trunk Assessment: Normal    Vision Patient Visual Report: No change from baseline     Perception     Praxis      Cognition Arousal: Alert Behavior During Therapy: WFL for tasks assessed/performed Overall Cognitive Status: Within  Functional Limits for tasks assessed                                 General Comments: Pleasant and motivated to work with OT        Exercises      Shoulder Instructions       General Comments      Pertinent Vitals/ Pain       Pain Assessment Pain Assessment: No/denies pain  Home Living                                          Prior Functioning/Environment              Frequency  Min  1X/week        Progress Toward Goals  OT Goals(current goals can now be found in the care plan section)  Progress towards OT goals: Progressing toward goals     Plan      Co-evaluation                 AM-PAC OT "6 Clicks" Daily Activity     Outcome Measure   Help from another person eating meals?: None Help from another person taking care of personal grooming?: A Little Help from another person toileting, which includes using toliet, bedpan, or urinal?: A Little Help from another person bathing (including washing, rinsing, drying)?: A Lot Help from another person to put on and taking off regular upper body clothing?: A Little Help from another person to put on and taking off regular lower body clothing?: A Little 6 Click Score: 18    End of Session Equipment Utilized During Treatment: Rolling walker (2 wheels)  OT Visit Diagnosis: Other abnormalities of gait and mobility (R26.89);Unsteadiness on feet (R26.81);Muscle weakness (generalized) (M62.81)   Activity Tolerance Patient tolerated treatment well   Patient Left in chair;with call bell/phone within reach;with chair alarm set   Nurse Communication          Time: 8413-2440 OT Time Calculation (min): 21 min  Charges:    Butch Penny, SOT

## 2023-03-07 NOTE — Progress Notes (Signed)
Physical Therapy Treatment Patient Details Name: Samantha Freeman MRN: [id number] DOB: 1935/06/24 Today's Date: 03/07/2023   History of Present Illness Samantha Freeman is a 87 y.o. female with medical history significant of recently diagnosed atrial fibrillation not on [facility], recently diagnosed CHF, type 2 diabetes, hypertension, hyperlipidemia, CKD stage IIIa, depression/anxiety, who presents to the ED on 02/25/23 due to atrial fibrillation.    PT Comments  Patient is agreeable to PT session. She is eager to be discharge to rehab. The patient required physical assistance for bed mobility. She ambulated in hallway with cues for rolling walker use for fall prevention and safety. Activity tolerance is limited by fatigue. Heart rate briefly up to 118 bpm with walking. Recommend rehabilitation <3 hours/day after this hospital stay. PT will continue to follow.    If plan is discharge home, recommend the following: A little help with walking and/or transfers;A little help with bathing/dressing/bathroom;Assistance with cooking/housework;Assist for transportation   Can travel by private vehicle     Yes  Equipment Recommendations  None recommended by PT    Recommendations for Other Services       Precautions / Restrictions Precautions Precautions: Fall Restrictions Weight Bearing Restrictions: No     Mobility  Bed Mobility Overal bed mobility: Needs Assistance Bed Mobility: Supine to Sit, Sit to Supine     Supine to sit: Contact guard Sit to supine: Min assist   General bed mobility comments: assistance for RLE support. verbal cues for technique    Transfers Overall transfer level: Needs assistance Equipment used: Rolling walker (2 wheels) Transfers: Sit to/from Stand Sit to Stand: Contact guard assist           General transfer comment: CGA for safety    Ambulation/Gait Ambulation/Gait assistance: Contact guard assist Gait Distance (Feet): 50 Feet Assistive device: Rolling  walker (2 wheels) Gait Pattern/deviations: Decreased stance time - left, Antalgic Gait velocity: decreased     General Gait Details: recommended to continue using rolling walker for safety for fall prevention and for offloading LLE (arthritis pain). patient is fatigued with activity with arm fatigue reported. heart rate up to 118bpm with ambulation with no shortness of breath noted   Stairs             Wheelchair Mobility     Tilt Bed    Modified Rankin (Stroke Patients Only)       Balance Overall balance assessment: Needs assistance Sitting-balance support: Feet supported Sitting balance-Leahy Scale: Good     Standing balance support: During functional activity, Bilateral upper extremity supported Standing balance-Leahy Scale: Poor Standing balance comment: patient relying on rolling walker for support in standing                            Cognition Arousal: Alert Behavior During Therapy: WFL for tasks assessed/performed Overall Cognitive Status: Within Functional Limits for tasks assessed                                          Exercises      General Comments        Pertinent Vitals/Pain Pain Assessment Pain Assessment: Faces Faces Pain Scale: Hurts a little bit Pain Location: L ankle and L knee (arthritis) Pain Descriptors / Indicators: Discomfort Pain Intervention(s): Monitored during session, Limited activity within patient's tolerance    Home Living  Prior Function            PT Goals (current goals can now be found in the care plan section) Acute Rehab PT Goals Patient Stated Goal: to go to rehab PT Goal Formulation: With patient Time For Goal Achievement: 03/12/23 Potential to Achieve Goals: Good Progress towards PT goals: Progressing toward goals    Frequency    Min 1X/week      PT Plan      Co-evaluation              AM-PAC PT "6 Clicks" Mobility    Outcome Measure  Help needed turning from your back to your side while in a flat bed without using bedrails?: None Help needed moving from lying on your back to sitting on the side of a flat bed without using bedrails?: A Little Help needed moving to and from a bed to a chair (including a wheelchair)?: A Little Help needed standing up from a chair using your arms (e.g., wheelchair or bedside chair)?: A Little Help needed to walk in hospital room?: A Little Help needed climbing 3-5 steps with a railing? : A Lot 6 Click Score: 18    End of Session   Activity Tolerance: Patient limited by fatigue Patient left: in bed;with call bell/phone within reach;with bed alarm set   PT Visit Diagnosis: Muscle weakness (generalized) (M62.81);Difficulty in walking, not elsewhere classified (R26.2)     Time: 1610-9604 PT Time Calculation (min) (ACUTE ONLY): 15 min  Charges:    $Therapeutic Activity: 8-22 mins PT General Charges $$ ACUTE PT VISIT: 1 Visit                     Donna Bernard, PT, MPT    Ina Homes 03/07/2023, 10:53 AM

## 2023-03-07 NOTE — TOC Progression Note (Signed)
Transition of Care [facility]) - Progression Note    Patient Details  Name: Samantha Freeman MRN: [id number] Date of Birth: 1935/06/29  [facility]) CM/SW Contact  Truddie Hidden, RN Phone Number: 03/07/2023, 11:01 AM  Clinical Narrative:    New request for auth started in [employer] portal RNCM spoke with Tifany in admissions from [employer], patient can admit today pending auth.     Expected Discharge Plan: Skilled Nursing Facility Barriers to Discharge: Continued Medical Work up  Expected Discharge Plan and Services     Post Acute Care Choice: Skilled Nursing Facility Living arrangements for the past 2 months: Apartment Expected Discharge Date: 03/06/23                                     Social Determinants of Health (SDOH) Interventions SDOH Screenings   Food Insecurity: No Food Insecurity (02/25/2023)  Housing: Low Risk  (02/25/2023)  Transportation Needs: No Transportation Needs (02/25/2023)  Utilities: Not At Risk (02/25/2023)  Tobacco Use: Low Risk  (02/25/2023)   Received from [facility]    Readmission Risk Interventions     No data to display

## 2023-03-07 NOTE — Progress Notes (Signed)
[facility] CLINIC CARDIOLOGY PROGRESS NOTE       Patient ID: Samantha Freeman MRN: [id number] DOB/AGE: 1936/04/12 87 y.o.  Admit date: 02/25/2023 Referring Physician Dr. Darlin Priestly Primary Physician Sparks, Duane Lope, MD  Primary Cardiologist Leanora Ivanoff, PA Reason for Consultation atrial fibrillation RVR  HPI: Samantha Freeman is a 87 y.o. female  with a past medical history of recently diagnosed atrial fibrillation, mild aortic stenosis, bradycardia, type 2 diabetes, hypercholesterolemia who presented to the ED on 02/25/2023 for generalized weakness and fatigue. Found to be in AF RVR in the ED. Cardiology was consulted for further evaluation.   Interval history: -Patient feeling well this AM, sitting upright in hospital bed without complaint.   -HR ranging from 90-110s, averaging around 100. -BP borderline. Denies any chest pain, dizziness or lightheadedness. -Walked with OT and tolerated this well.  Review of systems complete and found to be negative unless listed above    Past Medical History:  Diagnosis Date   Anemia    Anxiety    Aortic stenosis, mild    Arthritis    CKD (chronic kidney disease), stage III (HCC)    Complication of anesthesia    Propofol anaphylaxis   Cortical cataract    DDD (degenerative disc disease), lumbar    Depression    Dyspnea    GERD (gastroesophageal reflux disease)    Headache    Heart murmur    History of 2019 novel coronavirus disease (COVID-19) 12/01/2018   HLD (hyperlipidemia)    Hypertension    Pneumonia    Schatzki's ring    Seasonal allergies    T2DM (type 2 diabetes mellitus) (HCC)    Valvular insufficiency     Past Surgical History:  Procedure Laterality Date   ABDOMINAL HYSTERECTOMY     APPENDECTOMY     BICEPT TENODESIS Right 05/13/2018   Procedure: BICEPS TENODESIS;  Surgeon: Christena Flake, MD;  Location: [facility] ORS;  Service: Orthopedics;  Laterality: Right;   CARDIAC CATHETERIZATION     COLONOSCOPY     EYE SURGERY Left  11/2013   Corneal transplant   EYE SURGERY Right 09/2013   Corneal transplant   JOINT REPLACEMENT Right 2015   knee   REVERSE SHOULDER ARTHROPLASTY Left 10/13/2020   Procedure: REVERSE SHOULDER ARTHROPLASTY;  Surgeon: Christena Flake, MD;  Location: [facility] ORS;  Service: Orthopedics;  Laterality: Left;   SHOULDER ARTHROSCOPY WITH ROTATOR CUFF REPAIR Right 05/13/2018   Procedure: SHOULDER ARTHROSCOPY WITH DEBRIDEMENT, DECOMPRESSION AND ROTATOR CUFF REPAIR;  Surgeon: Christena Flake, MD;  Location: [facility] ORS;  Service: Orthopedics;  Laterality: Right;   TONSILLECTOMY      Medications Prior to Admission  Medication Sig Dispense Refill Last Dose   aspirin 325 MG tablet Take 325 mg by mouth daily.   02/25/2023   Calcium Carb-Cholecalciferol (CALCIUM 600/VITAMIN D3 PO) Take 1 tablet by mouth daily.   02/25/2023   Coenzyme Q10 10 MG capsule Take 10 mg by mouth every morning.   02/25/2023   dapagliflozin propanediol (FARXIGA) 10 MG TABS tablet Take 10 mg by mouth daily.   02/25/2023   enalapril (VASOTEC) 20 MG tablet Take 20 mg by mouth 2 (two) times daily.   02/25/2023   fenofibrate (TRICOR) 48 MG tablet Take 1 tablet by mouth daily.   02/25/2023   furosemide (LASIX) 20 MG tablet Take 20 mg by mouth daily as needed for fluid or edema.   prn at unknown   gabapentin (NEURONTIN) 100 MG capsule Take 200 mg by  mouth 3 (three) times daily.   02/25/2023   hydrOXYzine (ATARAX) 25 MG tablet Take 1 tablet by mouth 2 (two) times daily.   02/25/2023   insulin lispro (HUMALOG) 100 UNIT/ML injection Inject 7-19 Units into the skin 3 (three) times daily before meals. Blood Glucose level: 140-199 - 7 units, 200-250 - 9 units, 251-299 - 13 units,  300-349 - 17 units,  350 or above 19 units.   02/25/2023   LANTUS SOLOSTAR 100 UNIT/ML Solostar Pen Inject 36 Units into the skin at bedtime.   02/24/2023   magnesium oxide (MAG-OX) 400 MG tablet Take 800 mg by mouth 3 (three) times daily.   02/25/2023   metoprolol tartrate (LOPRESSOR) 50  MG tablet Take 1 tablet by mouth 2 (two) times daily.   02/25/2023   Multiple Vitamin (MULTIVITAMIN) tablet Take 1 tablet by mouth daily. Women's Daily Multivitamin   02/25/2023   pantoprazole (PROTONIX) 40 MG tablet Take 1 tablet (40 mg total) by mouth daily.   02/25/2023   prednisoLONE acetate (PRED FORTE) 1 % ophthalmic suspension Place 1 drop into both eyes at bedtime.   02/24/2023   rosuvastatin (CRESTOR) 10 MG tablet Take 10 mg by mouth daily.   02/24/2023   traZODone (DESYREL) 50 MG tablet Take 50 mg by mouth at bedtime.   prn at unknown   vitamin B-12 (CYANOCOBALAMIN) 1000 MCG tablet Take 1,000 mcg by mouth daily.   02/24/2023   [DISCONTINUED] ALPRAZolam (XANAX) 0.5 MG tablet Take 0.5 mg by mouth at bedtime.   02/24/2023   blood glucose meter kit and supplies KIT Dispense based on patient and insurance preference. Use up to four times daily as directed. (FOR ICD-9 250.00, 250.01). For QAC - HS accuchecks. 1 each 1    diltiazem (CARDIZEM CD) 180 MG 24 hr capsule Take 1 capsule (180 mg total) by mouth daily.      ferrous sulfate 325 (65 FE) MG tablet Take 325 mg by mouth daily with breakfast. (Patient not taking: Reported on 02/25/2023)   Not Taking   glucose blood (FREESTYLE LITE) test strip For glucose testing every before meals at bedtime. Diagnosis E 11.65  Can substitute to any accepted brand 100 each 0    Insulin Syringe-Needle U-100 25G X 1" 1 ML MISC For 4 times a day insulin SQ, 1 month supply. Diagnosis E11.65 30 each 0    PARoxetine (PAXIL) 10 MG tablet Take 10 mg by mouth daily.      Social History   Socioeconomic History   Marital status: Widowed    Spouse name: Not on file   Number of children: Not on file   Years of education: Not on file   Highest education level: Not on file  Occupational History   Not on file  Tobacco Use   Smoking status: Never   Smokeless tobacco: Never  Vaping Use   Vaping status: Never Used  Substance and Sexual Activity   Alcohol use: Never    Drug use: Never   Sexual activity: Not Currently  Other Topics Concern   Not on file  Social History Narrative   Lives alone, has support from son who is a Optician, dispensing and daughter in Social worker. Will be with mother when she has surgery.   Social Determinants of Health   Financial Resource Strain: Not on file  Food Insecurity: No Food Insecurity (02/25/2023)   Hunger Vital Sign    Worried About Running Out of Food in the Last Year: Never true  Ran Out of Food in the Last Year: Never true  Transportation Needs: No Transportation Needs (02/25/2023)   PRAPARE - Administrator, Civil Service (Medical): No    Lack of Transportation (Non-Medical): No  Physical Activity: Not on file  Stress: Not on file  Social Connections: Not on file  Intimate Partner Violence: Not At Risk (02/25/2023)   Humiliation, Afraid, Rape, and Kick questionnaire    Fear of Current or Ex-Partner: No    Emotionally Abused: No    Physically Abused: No    Sexually Abused: No    Family History  Problem Relation Age of Onset   Breast cancer Paternal Aunt      Vitals:   03/06/23 2342 03/07/23 0331 03/07/23 0617 03/07/23 0748  BP: 100/71 106/75 101/68   Pulse: (!) 110 (!) 110    Resp:      Temp: 98.6 F (37 C) 98.5 F (36.9 C)  98 F (36.7 C)  TempSrc: Oral Oral  Oral  SpO2: 94% 96%    Weight:        PHYSICAL EXAM General: Well-appearing, well nourished, in no acute distress sitting upright in hospital bed. HEENT: Normocephalic and atraumatic. Neck: No JVD.  Lungs: Normal respiratory effort on room air. Clear bilaterally to auscultation. No wheezes, crackles, rhonchi.  Heart: Irregularly irregular. Normal S1 and S2 without gallops or murmurs.  Abdomen: Non-distended appearing.  Msk: Normal strength and tone for age. Extremities: Warm and well perfused. No clubbing, cyanosis.  No edema.  Neuro: Alert and oriented X 3. Psych: Answers questions appropriately.   Labs: Basic Metabolic Panel: Recent  Labs    03/06/23 0401 03/07/23 0418  NA 136 136  K 3.9 3.6  CL 100 100  CO2 25 24  GLUCOSE 236* 174*  BUN 38* 35*  CREATININE 1.32* 1.01*  CALCIUM 9.2 9.3  MG 2.0 1.8   Liver Function Tests: No results for input(s): "AST", "ALT", "ALKPHOS", "BILITOT", "PROT", "ALBUMIN" in the last 72 hours. No results for input(s): "LIPASE", "AMYLASE" in the last 72 hours. CBC: No results for input(s): "WBC", "NEUTROABS", "HGB", "HCT", "MCV", "PLT" in the last 72 hours.  Cardiac Enzymes: No results for input(s): "CKTOTAL", "CKMB", "CKMBINDEX", "TROPONINIHS" in the last 72 hours.  BNP: No results for input(s): "BNP" in the last 72 hours. D-Dimer: No results for input(s): "DDIMER" in the last 72 hours. Hemoglobin A1C: No results for input(s): "HGBA1C" in the last 72 hours. Fasting Lipid Panel: No results for input(s): "CHOL", "HDL", "LDLCALC", "TRIG", "CHOLHDL", "LDLDIRECT" in the last 72 hours. Thyroid Function Tests: No results for input(s): "TSH", "T4TOTAL", "T3FREE", "THYROIDAB" in the last 72 hours.  Invalid input(s): "FREET3" Anemia Panel: No results for input(s): "VITAMINB12", "FOLATE", "FERRITIN", "TIBC", "IRON", "RETICCTPCT" in the last 72 hours.   Radiology: CT Angio Chest PE W/Cm &/Or Wo Cm  Result Date: 02/25/2023 CLINICAL DATA:  Pulmonary embolism (PE) suspected, high prob. Weakness. Shortness of breath. EXAM: CT ANGIOGRAPHY CHEST WITH CONTRAST TECHNIQUE: Multidetector CT imaging of the chest was performed using the standard protocol during bolus administration of intravenous contrast. Multiplanar CT image reconstructions and MIPs were obtained to evaluate the vascular anatomy. RADIATION DOSE REDUCTION: This exam was performed according to the departmental dose-optimization program which includes automated exposure control, adjustment of the mA and/or kV according to patient size and/or use of iterative reconstruction technique. CONTRAST:  75mL OMNIPAQUE IOHEXOL 350 MG/ML SOLN  COMPARISON:  CT angiography chest from 01/30/2023. FINDINGS: Cardiovascular: No evidence of embolism to the  proximal subsegmental pulmonary artery level. Mild cardiomegaly. No pericardial effusion. No aortic aneurysm. There are coronary artery calcifications, in keeping with coronary artery disease. There are also mild-to-moderate peripheral atherosclerotic vascular calcifications of thoracic aorta and its major branches. There is dilation of the main pulmonary trunk measuring up to 3.3 cm, which is nonspecific but can be seen with pulmonary artery hypertension. Mediastinum/Nodes: Visualized thyroid gland appears grossly unremarkable. No solid / cystic mediastinal masses. The esophagus is nondistended precluding optimal assessment. There are few mildly prominent mediastinal and hilar lymph nodes, which do not meet the size criteria for lymphadenopathy and appear grossly similar to the prior study, favoring benign etiology. No axillary lymphadenopathy by size criteria. Lungs/Pleura: The central tracheo-bronchial tree is patent. There is mild, smooth, circumferential thickening of the segmental and subsegmental bronchial walls, throughout bilateral lungs, which is nonspecific. Findings are most commonly seen with pulmonary edema, bronchitis or reactive airway disease, such as asthma. Redemonstration of filling defects in the bilateral lower lobe subsegmental bronchi, likely due to mucus/secretions or aspiration. There are patchy areas of smooth interlobular septal thickening throughout bilateral lungs and bilateral trace pleural effusions. There are patchy areas of linear, plate-like atelectasis and/or scarring throughout bilateral lungs. No mass or consolidation. No pneumothorax. No suspicious lung nodules. Upper Abdomen: Visualized upper abdominal viscera within normal limits. Musculoskeletal: The visualized soft tissues of the chest wall are grossly unremarkable. No suspicious osseous lesions. There are mild  multilevel degenerative changes in the visualized spine. Review of the MIP images confirms the above findings. IMPRESSION: *No evidence of pulmonary embolism to the proximal subsegmental pulmonary artery level. *There is mild smooth interlobular septal thickening throughout bilateral lungs with bilateral trace pleural effusions favoring congestive heart failure/pulmonary edema. *Multiple other nonacute observations, as described above. Electronically Signed   By: Jules Schick M.D.   On: 02/25/2023 14:34   DG Chest Port 1 View  Result Date: 02/25/2023 CLINICAL DATA:  Shortness of breath. EXAM: PORTABLE CHEST 1 VIEW COMPARISON:  Sep 15, 2019. FINDINGS: Mild cardiomegaly is noted. Mild central pulmonary vascular congestion is noted. Minimal bibasilar pulmonary edema is noted. Status post left shoulder arthroplasty. IMPRESSION: Mild cardiomegaly with mild central pulmonary vascular congestion and minimal bibasilar pulmonary edema. Electronically Signed   By: Lupita Raider M.D.   On: 02/25/2023 13:22    ECHO 01/2023: 1. Left ventricular ejection fraction, by estimation, is 60 to 65%. The left ventricle has normal function. The left ventricle has no regional wall motion abnormalities. There is moderate left ventricular hypertrophy. Indeterminate diastolic filling due   to E-A fusion.   2. Right ventricular systolic function is normal. The right ventricular size is normal.   3. Left atrial size was moderately dilated.   4. Right atrial size was mildly dilated.   5. The mitral valve is normal in structure. Mild mitral valve regurgitation. No evidence of mitral stenosis.   6. The aortic valve is normal in structure. Aortic valve regurgitation is  not visualized. Mild aortic valve stenosis.   7. The inferior vena cava is normal in size with greater than 50%  respiratory variability, suggesting right atrial pressure of 3 mmHg.   TELEMETRY reviewed by me 03/07/2023: atrial fibrillation rate 90s  -100s  EKG reviewed by me: AF RVR rate 116 bpm  Data reviewed by me 03/07/2023: last 24h vitals tele labs imaging I/O hospitalist progress note  Principal Problem:   Atrial fibrillation with RVR (HCC) Active Problems:   Chronic kidney disease, stage 3a (HCC)  DM (diabetes mellitus) (HCC)   HTN (hypertension)   Acute hypoxic respiratory failure (HCC)   Anxiety and depression   Acute heart failure with preserved ejection fraction (HFpEF) (HCC)   Generalized weakness    ASSESSMENT AND PLAN:  Samantha Freeman is a 87 y.o. female  with a past medical history of recently diagnosed atrial fibrillation, mild aortic stenosis, bradycardia, type 2 diabetes, hypercholesterolemia who presented to the ED on 02/25/2023 for generalized weakness and fatigue. Found to be in AF RVR in the ED. Cardiology was consulted for further evaluation.   # Atrial fibrillation RVR # Recently diagnosed atrial fibrillation Patient presented with worsening weakness and fatigue. Found to be in AF RVR in the ED. Was diagnosed with AF during admission 10/9-10/14. Initially started on eliquis but this has been held due to epistaxis requiring packing. Rate controlled 11/8 but over the weekend had issues with this becoming elevated.  -Start digoxin IV load 0.5 > 0.25 > 0.25 each dose 6 hours apart. Then start 0.125 mg every other day.  -Increase metoprolol to 50 mg twice daily. Continue diltiazem to 60 mg q8 hours.  -Home enalapril and amlodipine discontinued due to borderline BP on rate control medications.  -Patient declines resuming anticoagulation given recent nosebleeds. Educated on risks associated with atrial fibrillation and stroke.  -Not a candidate at this time for cardioversion or antiarrhythmic medications due to risk of stroke given she refuses anticoagulation.  # Acute on chronic HFpEF (EF 60-65% in 01/2023) # Acute hypoxic respiratory failure Patient with SOB prior to admission. BNP 517. S/p IV diuresis. Poorly  documented output but weight down nearly 10 kg since 11/4. SOB improved overall and appears euvolemic. -Continue farxiga.  -Continue home lasix prn on discharge.   # Hyperlipidemia -Continue rosuvastatin 10 mg daily and aspirin 81 mg daily.   This patient's plan of care was discussed and created with Dr. Corky Sing and he is in agreement.  Signed: Gale Journey, PA-C  03/07/2023, 1:30 PM [facility]

## 2023-03-07 NOTE — Progress Notes (Signed)
PROGRESS NOTE    Samantha Freeman  VHQ:[id number] DOB: 28-Feb-1936 DOA: 02/25/2023 PCP: Marguarite Arbour, MD    Assessment & Plan:   Principal Problem:   Atrial fibrillation with RVR (HCC) Active Problems:   Acute hypoxic respiratory failure (HCC)   Acute heart failure with preserved ejection fraction (HFpEF) (HCC)   Chronic kidney disease, stage 3a (HCC)   DM (diabetes mellitus) (HCC)   HTN (hypertension)   Anxiety and depression   Generalized weakness  Assessment and Plan:  Atrial fibrillation: with RVR. Presenting with ongoing weakness, fatigue, DOB with acute worsening over the last 1-2 days.  Patient stopped anticoagulation after developing epistaxis requiring packing, and declined anticoagulation. Afib was first dx during Oct hospitalization. Continue on metoprolol, diltiazem; & digoxin x 3 doses as per cardio. D/c enalapril, amlodipine as per cardio    Acute hypoxic respiratory failure: has mild pulmonary edema likely triggered by atrial fibrillation with RVR.  CTA without PE.  No signs of infectious process. S/p IV lasix 40 daily x 4. Pt reported her baseline O2 sats are low, and denied dyspnea or need for supplemental oxygen. Weaned off of supplemental oxygen. Resolved     Acute heart failure with preserved ejection fraction: likely triggered by atrial fibrillation with RVR.  BNP is elevated at 512, higher than previous admission. S/p IV lasix.  Continue on metoprolol, farxiga   Generalized weakness:  PT/OT recs SNF. Waiting on another insurance auth as per CM    Depression: severity unknown. Continue on home dose of paroxetine, trazodone    Anxiety: severity unknown. Continue on home dose of xanax     HTN: continue on metoprolol. D/c amlodipine, enalapril as per cardio    DM2: poorly controlled.Continue on insulin, farxiga    AKI on CKDIIIa: Cr is labile. Avoid nephrotoxic meds   Hypokalemia: WNL today    Hyponatremia: resolved   Obesity: BMI 29.9. Would benefit  from weight loss     DVT prophylaxis: lovenox  Code Status: DNR Family Communication:  Disposition Plan: waiting on another insurance auth   Level of care: Telemetry Cardiac  Status is: Inpatient Remains inpatient appropriate because: waiting on another insurance auth     Consultants:  Cardio   Procedures:  Antimicrobials:    Subjective: Pt c/o fatigue   Objective: Vitals:   03/06/23 2342 03/07/23 0331 03/07/23 0617 03/07/23 0748  BP: 100/71 106/75 101/68   Pulse: (!) 110 (!) 110    Resp:      Temp: 98.6 F (37 C) 98.5 F (36.9 C)  98 F (36.7 C)  TempSrc: Oral Oral  Oral  SpO2: 94% 96%    Weight:        Intake/Output Summary (Last 24 hours) at 03/07/2023 1506 Last data filed at 03/06/2023 2057 Gross per 24 hour  Intake 240 ml  Output --  Net 240 ml   Filed Weights   02/28/23 0802 03/01/23 0745 03/03/23 0500  Weight: 76.4 kg 79.4 kg 79 kg    Examination:  General exam: Appears calm and comfortable  Respiratory system: Clear to auscultation. Respiratory effort normal. Cardiovascular system: irregularly irregular. No rubs, gallops or clicks.  Gastrointestinal system: Abdomen is nondistended, soft and nontender. Normal bowel sounds heard. Central nervous system: Alert and oriented. Moves all extremities  Psychiatry: Judgement and insight appear normal. Mood & affect appropriate.     Data Reviewed: I have personally reviewed following labs and imaging studies  CBC: Recent Labs  Lab 03/01/23 0449 03/02/23 0248 03/03/23 0413  03/04/23 0320  WBC 6.9 8.2 6.7 6.5  HGB 14.6 14.4 14.6 14.9  HCT 44.2 43.9 45.2 45.0  MCV 88.4 90.3 90.8 89.8  PLT 264 267 244 229   Basic Metabolic Panel: Recent Labs  Lab 03/03/23 0413 03/04/23 0320 03/05/23 0304 03/06/23 0401 03/07/23 0418  NA 132* 137 137 136 136  K 4.1 4.0 4.0 3.9 3.6  CL 98 101 99 100 100  CO2 27 26 28 25 24   GLUCOSE 359* 254* 192* 236* 174*  BUN 42* 40* 43* 38* 35*  CREATININE 1.29*  1.21* 1.41* 1.32* 1.01*  CALCIUM 9.0 9.4 9.3 9.2 9.3  MG 1.9 2.5* 2.0 2.0 1.8   GFR: Estimated Creatinine Clearance: 41.4 mL/min (A) (by C-G formula based on SCr of 1.01 mg/dL (H)). Liver Function Tests: No results for input(s): "AST", "ALT", "ALKPHOS", "BILITOT", "PROT", "ALBUMIN" in the last 168 hours. No results for input(s): "LIPASE", "AMYLASE" in the last 168 hours. No results for input(s): "AMMONIA" in the last 168 hours. Coagulation Profile: No results for input(s): "INR", "PROTIME" in the last 168 hours. Cardiac Enzymes: No results for input(s): "CKTOTAL", "CKMB", "CKMBINDEX", "TROPONINI" in the last 168 hours. BNP (last 3 results) No results for input(s): "PROBNP" in the last 8760 hours. HbA1C: No results for input(s): "HGBA1C" in the last 72 hours. CBG: Recent Labs  Lab 03/06/23 1236 03/06/23 1738 03/06/23 2100 03/07/23 0751 03/07/23 1245  GLUCAP 257* 222* 253* 225* 285*   Lipid Profile: No results for input(s): "CHOL", "HDL", "LDLCALC", "TRIG", "CHOLHDL", "LDLDIRECT" in the last 72 hours. Thyroid Function Tests: No results for input(s): "TSH", "T4TOTAL", "FREET4", "T3FREE", "THYROIDAB" in the last 72 hours. Anemia Panel: No results for input(s): "VITAMINB12", "FOLATE", "FERRITIN", "TIBC", "IRON", "RETICCTPCT" in the last 72 hours. Sepsis Labs: No results for input(s): "PROCALCITON", "LATICACIDVEN" in the last 168 hours.  No results found for this or any previous visit (from the past 240 hour(s)).       Radiology Studies: No results found.      Scheduled Meds:  ALPRAZolam  0.5 mg Oral QHS   aspirin EC  81 mg Oral Daily   cyanocobalamin  1,000 mcg Oral Daily   dapagliflozin propanediol  10 mg Oral Daily   digoxin  0.25 mg Intravenous Daily   Followed by   Melene Muller ON 03/08/2023] digoxin  0.25 mg Intravenous Daily   [START ON 03/08/2023] digoxin  0.125 mg Oral QODAY   diltiazem  60 mg Oral Q8H   enoxaparin (LOVENOX) injection  30 mg Subcutaneous Q24H    feeding supplement (GLUCERNA SHAKE)  237 mL Oral BID BM   fenofibrate  54 mg Oral Daily   gabapentin  200 mg Oral TID   hydrOXYzine  25 mg Oral BID   insulin aspart  0-15 Units Subcutaneous TID WC   insulin aspart  15 Units Subcutaneous TID WC   insulin glargine-yfgn  40 Units Subcutaneous QHS   metoprolol tartrate  50 mg Oral BID   multivitamin with minerals  1 tablet Oral Daily   pantoprazole  40 mg Oral Daily   prednisoLONE acetate  1 drop Both Eyes QHS   rOPINIRole  4 mg Oral QHS   rosuvastatin  10 mg Oral QHS   Continuous Infusions:   LOS: 10 days      Charise Killian, MD [facility] Pager [phone]  If 7PM-7AM, please contact night-coverage [email or web address]  03/07/2023, 3:06 PM

## 2023-03-07 NOTE — Plan of Care (Signed)
  Problem: Health Behavior/Discharge Planning: Goal: Ability to identify and utilize available resources and services will improve Outcome: Progressing Goal: Ability to manage health-related needs will improve Outcome: Progressing   Problem: Metabolic: Goal: Ability to maintain appropriate glucose levels will improve Outcome: Progressing   Problem: Nutritional: Goal: Maintenance of adequate nutrition will improve Outcome: Progressing Goal: Progress toward achieving an optimal weight will improve Outcome: Progressing   Problem: Skin Integrity: Goal: Risk for impaired skin integrity will decrease Outcome: Progressing

## 2023-03-07 NOTE — Inpatient Diabetes Management (Signed)
Inpatient Diabetes Program Recommendations  AACE/ADA: New Consensus Statement on Inpatient Glycemic Control (2015)  Target Ranges:  Prepandial:   less than 140 mg/dL      Peak postprandial:   less than 180 mg/dL (1-2 hours)      Critically ill patients:  140 - 180 mg/dL    Latest Reference Range & Units 03/06/23 07:58 03/06/23 12:36 03/06/23 17:38 03/06/23 21:00  Glucose-Capillary 70 - 99 mg/dL 914 (H)  30 units Novolog  257 (H)  23 units Novolog  222 (H)  20 units Novolog  253 (H)    40 units Semglee  (H): Data is abnormally high  Latest Reference Range & Units 03/07/23 07:51  Glucose-Capillary 70 - 99 mg/dL 782 (H)  20 units Novolog   (H): Data is abnormally high   Home DM Meds: Lantus 36 units at bedtime     Humalog 7-19 units TID     Farxiga 10 mg daily      Current Orders: Semglee 40 units at bedtime                           Novolog Moderate Correction Scale/ SSI (0-15 units) TID AC                           Novolog 15 units TID with meals                           Farxiga 10 mg daily     MD- Note CBG 273 this AM.  Please consider:   1. Increase Semglee to 45 units at bedtime   2. Increase Novolog Meal Coverage to 18 units TID with meals     --Will follow patient during hospitalization--  Ambrose Finland RN, MSN, CDCES Diabetes Coordinator Inpatient Glycemic Control Team Team Pager: [phone] (8a-5p)

## 2023-03-08 DIAGNOSIS — I4891 Unspecified atrial fibrillation: Secondary | ICD-10-CM | POA: Diagnosis not present

## 2023-03-08 LAB — GLUCOSE, CAPILLARY
Glucose-Capillary: 102 mg/dL — ABNORMAL HIGH (ref 70–99)
Glucose-Capillary: 229 mg/dL — ABNORMAL HIGH (ref 70–99)
Glucose-Capillary: 215 mg/dL — ABNORMAL HIGH (ref 70–99)

## 2023-03-08 LAB — MAGNESIUM: Magnesium: 1.8 mg/dL (ref 1.7–2.4)

## 2023-03-08 LAB — BASIC METABOLIC PANEL
Anion gap: 12 (ref 5–15)
BUN: 37 mg/dL — ABNORMAL HIGH (ref 8–23)
CO2: 22 mmol/L (ref 22–32)
Calcium: 9.4 mg/dL (ref 8.9–10.3)
Chloride: 103 mmol/L (ref 98–111)
Creatinine, Ser: 1.12 mg/dL — ABNORMAL HIGH (ref 0.44–1.00)
GFR, Estimated: 48 mL/min — ABNORMAL LOW (ref >60)
Glucose, Bld: 90 mg/dL (ref 70–99)
Potassium: 3.6 mmol/L (ref 3.5–5.1)
Sodium: 137 mmol/L (ref 135–145)

## 2023-03-08 MED ORDER — DILTIAZEM HCL ER COATED BEADS 120 MG PO CP24
120.0000 mg | ORAL_CAPSULE | Freq: Every day | ORAL | Status: DC
  Administered 2023-03-08 – 2023-03-11 (×4): 120 mg via ORAL
  Filled 2023-03-08 (×4): qty 1

## 2023-03-08 NOTE — Progress Notes (Signed)
PROGRESS NOTE    Samantha Freeman  NGE:[id number] DOB: December 09, 1935 DOA: 02/25/2023 PCP: Marguarite Arbour, MD    Assessment & Plan:   Principal Problem:   Atrial fibrillation with RVR (HCC) Active Problems:   Acute hypoxic respiratory failure (HCC)   Acute heart failure with preserved ejection fraction (HFpEF) (HCC)   Chronic kidney disease, stage 3a (HCC)   DM (diabetes mellitus) (HCC)   HTN (hypertension)   Anxiety and depression   Generalized weakness  Assessment and Plan:  Atrial fibrillation: with RVR. Presenting with ongoing weakness, fatigue, DOB with acute worsening over the last 1-2 days.  Patient stopped anticoagulation after developing epistaxis requiring packing, and declined anticoagulation. Afib was first dx during Oct hospitalization. Continue on metoprolol, diltiazem, digoxin as per cardio. D/c enalapril, amlodipine as per cardio    Acute hypoxic respiratory failure: has mild pulmonary edema likely triggered by atrial fibrillation with RVR.  CTA without PE.  No signs of infectious process. S/p IV lasix 40 daily x 4. Pt reported her baseline O2 sats are low, and denied dyspnea or need for supplemental oxygen. Weaned off of supplemental oxygen. Resolved     Acute heart failure with preserved ejection fraction: likely triggered by atrial fibrillation with RVR.  BNP is elevated at 512, higher than previous admission. S/p IV lasix.  Continue on metoprolol, digoxin, farxiga as per cardio    Generalized weakness: PT/OT recs SNF. Waiting on another insurance auth as per CM    Depression: severity unknown. Continue on home dose of trazodone, paroxetine     Anxiety: severity unknown. Continue on home dose of xanax   HTN: continue on BB. D/c amlodipine, enalapril as per cardio    DM2: poor controlled. Continue on glargine, SSI w/ accuchecks, farxiga  AKI on CKDIIIa: Cr is labile. Will continue to monitor   Hypokalemia: WNL today    Hyponatremia: resolved   Obesity: BMI  29.9. Would benefit weight loss      DVT prophylaxis: lovenox  Code Status: DNR Family Communication:  Disposition Plan: waiting on another insurance auth   Level of care: Telemetry Cardiac  Status is: Inpatient Remains inpatient appropriate because: waiting on another insurance auth     Consultants:  Cardio   Procedures:  Antimicrobials:    Subjective: Pt c/o fatigue   Objective: Vitals:   03/08/23 0024 03/08/23 0355 03/08/23 0800 03/08/23 1134  BP:   (!) 115/96 128/77  Pulse: 90  95 90  Resp:      Temp: 98 F (36.7 C) 98.6 F (37 C) (!) 97.4 F (36.3 C) 97.8 F (36.6 C)  TempSrc:   Oral Oral  SpO2: 97% 98% 97%   Weight:       No intake or output data in the 24 hours ending 03/08/23 1435  Filed Weights   02/28/23 0802 03/01/23 0745 03/03/23 0500  Weight: 76.4 kg 79.4 kg 79 kg    Examination:  General exam: Appears comfortable  Respiratory system: clear breath sounds b/l Cardiovascular system: irregularly irregular  Gastrointestinal system: abd is soft, NT, ND & normal bowel sounds  Central nervous system: alert and oriented. Moves all extremities   Psychiatry: Judgement and insight appears normal. Appropriate mood and affect     Data Reviewed: I have personally reviewed following labs and imaging studies  CBC: Recent Labs  Lab 03/02/23 0248 03/03/23 0413 03/04/23 0320  WBC 8.2 6.7 6.5  HGB 14.4 14.6 14.9  HCT 43.9 45.2 45.0  MCV 90.3 90.8 89.8  PLT  267 244 229   Basic Metabolic Panel: Recent Labs  Lab 03/04/23 0320 03/05/23 0304 03/06/23 0401 03/07/23 0418 03/08/23 0342  NA 137 137 136 136 137  K 4.0 4.0 3.9 3.6 3.6  CL 101 99 100 100 103  CO2 26 28 25 24 22   GLUCOSE 254* 192* 236* 174* 90  BUN 40* 43* 38* 35* 37*  CREATININE 1.21* 1.41* 1.32* 1.01* 1.12*  CALCIUM 9.4 9.3 9.2 9.3 9.4  MG 2.5* 2.0 2.0 1.8 1.8   GFR: Estimated Creatinine Clearance: 37.3 mL/min (A) (by C-G formula based on SCr of 1.12 mg/dL (H)). Liver  Function Tests: No results for input(s): "AST", "ALT", "ALKPHOS", "BILITOT", "PROT", "ALBUMIN" in the last 168 hours. No results for input(s): "LIPASE", "AMYLASE" in the last 168 hours. No results for input(s): "AMMONIA" in the last 168 hours. Coagulation Profile: No results for input(s): "INR", "PROTIME" in the last 168 hours. Cardiac Enzymes: No results for input(s): "CKTOTAL", "CKMB", "CKMBINDEX", "TROPONINI" in the last 168 hours. BNP (last 3 results) No results for input(s): "PROBNP" in the last 8760 hours. HbA1C: No results for input(s): "HGBA1C" in the last 72 hours. CBG: Recent Labs  Lab 03/07/23 1245 03/07/23 1546 03/07/23 2045 03/08/23 0756 03/08/23 1119  GLUCAP 285* 257* 132* 102* 229*   Lipid Profile: No results for input(s): "CHOL", "HDL", "LDLCALC", "TRIG", "CHOLHDL", "LDLDIRECT" in the last 72 hours. Thyroid Function Tests: No results for input(s): "TSH", "T4TOTAL", "FREET4", "T3FREE", "THYROIDAB" in the last 72 hours. Anemia Panel: No results for input(s): "VITAMINB12", "FOLATE", "FERRITIN", "TIBC", "IRON", "RETICCTPCT" in the last 72 hours. Sepsis Labs: No results for input(s): "PROCALCITON", "LATICACIDVEN" in the last 168 hours.  No results found for this or any previous visit (from the past 240 hour(s)).       Radiology Studies: No results found.      Scheduled Meds:  ALPRAZolam  0.5 mg Oral QHS   aspirin EC  81 mg Oral Daily   cyanocobalamin  1,000 mcg Oral Daily   dapagliflozin propanediol  10 mg Oral Daily   digoxin  0.125 mg Oral QODAY   diltiazem  120 mg Oral Daily   enoxaparin (LOVENOX) injection  30 mg Subcutaneous Q24H   feeding supplement (GLUCERNA SHAKE)  237 mL Oral BID BM   fenofibrate  54 mg Oral Daily   gabapentin  200 mg Oral TID   hydrOXYzine  25 mg Oral BID   insulin aspart  0-15 Units Subcutaneous TID WC   insulin aspart  15 Units Subcutaneous TID WC   insulin glargine-yfgn  40 Units Subcutaneous QHS   metoprolol tartrate   50 mg Oral BID   multivitamin with minerals  1 tablet Oral Daily   pantoprazole  40 mg Oral Daily   prednisoLONE acetate  1 drop Both Eyes QHS   rOPINIRole  4 mg Oral QHS   rosuvastatin  10 mg Oral QHS   Continuous Infusions:   LOS: 11 days      Charise Killian, MD [facility] Pager [phone]  If 7PM-7AM, please contact night-coverage [email or web address]  03/08/2023, 2:35 PM

## 2023-03-08 NOTE — Progress Notes (Addendum)
Physical Therapy Treatment Patient Details Name: Samantha Freeman MRN: [id number] DOB: May 10, 1935 Today's Date: 03/08/2023   History of Present Illness Samantha Freeman is a 87 y.o. female with medical history significant of recently diagnosed atrial fibrillation not on [facility], recently diagnosed CHF, type 2 diabetes, hypertension, hyperlipidemia, CKD stage IIIa, depression/anxiety, who presents to the ED on 02/25/23 due to atrial fibrillation.    PT Comments  Pt received seated in recliner upon arrival to room and pt agreeable to therapy.  Pt with much better performance, noting an increased ability to ambulate further distance without much fatigue.  Pt did note arms were fatiguing at the conclusion of the session due to weight displaced through the UE's, however she did not report any SOB.  Pt able to ambulate fully around the nursing station.  Pt required verbal cues for body placement and not allowing walker to get away from pt.  Pt otherwise performing well.  HR did spike to 122 during ambulation, however maintained 110-120 bpm for the majority of the time she spent ambulating.  Pt returned to recliner with all needs met and call bell within reach.   If plan is discharge home, recommend the following: A little help with walking and/or transfers;A little help with bathing/dressing/bathroom;Assistance with cooking/housework;Assist for transportation   Can travel by private vehicle     Yes  Equipment Recommendations  None recommended by PT    Recommendations for Other Services       Precautions / Restrictions Precautions Precautions: Fall Restrictions Weight Bearing Restrictions: No     Mobility  Bed Mobility               General bed mobility comments: pt upright in recliner upon arrival and returned to recliner at end of session.    Transfers Overall transfer level: Needs assistance Equipment used: Rolling walker (2 wheels) Transfers: Sit to/from Stand Sit to Stand: Contact  guard assist           General transfer comment: CGA for safety    Ambulation/Gait Ambulation/Gait assistance: Contact guard assist Gait Distance (Feet): 180 Feet Assistive device: Rolling walker (2 wheels)   Gait velocity: decreased     General Gait Details: pt with much better mobility, however HR rose to as high as 122 bpm with no SOB noted.   Stairs             Wheelchair Mobility     Tilt Bed    Modified Rankin (Stroke Patients Only)       Balance Overall balance assessment: Needs assistance Sitting-balance support: Feet supported Sitting balance-Leahy Scale: Good Sitting balance - Comments: Pt able to maintain seated balance at edge of bed during functional activities without LOB noted   Standing balance support: During functional activity, Bilateral upper extremity supported   Standing balance comment: patient relying on rolling walker for support in standing                            Cognition Arousal: Alert Behavior During Therapy: WFL for tasks assessed/performed Overall Cognitive Status: Within Functional Limits for tasks assessed                                 General Comments: Pleasant and motivated to work with OT        Exercises      General Comments  Pertinent Vitals/Pain Pain Assessment Pain Assessment: No/denies pain    Home Living                          Prior Function            PT Goals (current goals can now be found in the care plan section) Acute Rehab PT Goals Patient Stated Goal: to go to rehab PT Goal Formulation: With patient Time For Goal Achievement: 03/12/23 Potential to Achieve Goals: Good Progress towards PT goals: Progressing toward goals    Frequency    Min 1X/week      PT Plan      Co-evaluation              AM-PAC PT "6 Clicks" Mobility   Outcome Measure  Help needed turning from your back to your side while in a flat bed without  using bedrails?: None Help needed moving from lying on your back to sitting on the side of a flat bed without using bedrails?: A Little Help needed moving to and from a bed to a chair (including a wheelchair)?: A Little Help needed standing up from a chair using your arms (e.g., wheelchair or bedside chair)?: A Little Help needed to walk in hospital room?: A Little Help needed climbing 3-5 steps with a railing? : A Lot 6 Click Score: 18    End of Session Equipment Utilized During Treatment: Gait belt Activity Tolerance: Patient tolerated treatment well Patient left: in chair;with call bell/phone within reach;with chair alarm set Nurse Communication: Mobility status PT Visit Diagnosis: Muscle weakness (generalized) (M62.81);Difficulty in walking, not elsewhere classified (R26.2)     Time: 1308-6578 PT Time Calculation (min) (ACUTE ONLY): 18 min  Charges:    $Therapeutic Activity: 8-22 mins PT General Charges $$ ACUTE PT VISIT: 1 Visit                     Nolon Bussing, PT, DPT Physical Therapist - [facility]  03/08/23, 4:33 PM

## 2023-03-08 NOTE — Progress Notes (Signed)
[facility] Card[facility]  CARDIOLOGY PROGRESS NOTE  Patient ID: Samantha Freeman MRN: [id number] DOB/AGE: Jan 17, 1936 87 y.o.  Admit date: 02/25/2023 Referring Physician Dr. Fran Lowes Primary Cardiologist Leanora Ivanoff, PA Reason for Consultation for with RVR  HPI: No complaints this morning.  Heart rate improved to 70s to 80s.  Review of systems complete and found to be negative unless listed above     Past Medical History:  Diagnosis Date   Anemia    Anxiety    Aortic stenosis, mild    Arthritis    CKD (chronic kidney disease), stage III (HCC)    Complication of anesthesia    Propofol anaphylaxis   Cortical cataract    DDD (degenerative disc disease), lumbar    Depression    Dyspnea    GERD (gastroesophageal reflux disease)    Headache    Heart murmur    History of 2019 novel coronavirus disease (COVID-19) 12/01/2018   HLD (hyperlipidemia)    Hypertension    Pneumonia    Schatzki's ring    Seasonal allergies    T2DM (type 2 diabetes mellitus) (HCC)    Valvular insufficiency     Past Surgical History:  Procedure Laterality Date   ABDOMINAL HYSTERECTOMY     APPENDECTOMY     BICEPT TENODESIS Right 05/13/2018   Procedure: BICEPS TENODESIS;  Surgeon: Christena Flake, MD;  Location: [facility] ORS;  Service: Orthopedics;  Laterality: Right;   CARDIAC CATHETERIZATION     COLONOSCOPY     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   EYE SURGERY Right 09/2013   Corneal transplant   JOINT REPLACEMENT Right 2015   knee   REVERSE SHOULDER ARTHROPLASTY Left 10/13/2020   Procedure: REVERSE SHOULDER ARTHROPLASTY;  Surgeon: Christena Flake, MD;  Location: [facility] ORS;  Service: Orthopedics;  Laterality: Left;   SHOULDER ARTHROSCOPY WITH ROTATOR CUFF REPAIR Right 05/13/2018   Procedure: SHOULDER ARTHROSCOPY WITH DEBRIDEMENT, DECOMPRESSION AND ROTATOR CUFF REPAIR;  Surgeon: Christena Flake, MD;  Location: [facility] ORS;  Service: Orthopedics;  Laterality: Right;   TONSILLECTOMY      Medications Prior to Admission   Medication Sig Dispense Refill Last Dose   aspirin 325 MG tablet Take 325 mg by mouth daily.   02/25/2023   Calcium Carb-Cholecalciferol (CALCIUM 600/VITAMIN D3 PO) Take 1 tablet by mouth daily.   02/25/2023   Coenzyme Q10 10 MG capsule Take 10 mg by mouth every morning.   02/25/2023   dapagliflozin propanediol (FARXIGA) 10 MG TABS tablet Take 10 mg by mouth daily.   02/25/2023   enalapril (VASOTEC) 20 MG tablet Take 20 mg by mouth 2 (two) times daily.   02/25/2023   fenofibrate (TRICOR) 48 MG tablet Take 1 tablet by mouth daily.   02/25/2023   furosemide (LASIX) 20 MG tablet Take 20 mg by mouth daily as needed for fluid or edema.   prn at unknown   gabapentin (NEURONTIN) 100 MG capsule Take 200 mg by mouth 3 (three) times daily.   02/25/2023   hydrOXYzine (ATARAX) 25 MG tablet Take 1 tablet by mouth 2 (two) times daily.   02/25/2023   insulin lispro (HUMALOG) 100 UNIT/ML injection Inject 7-19 Units into the skin 3 (three) times daily before meals. Blood Glucose level: 140-199 - 7 units, 200-250 - 9 units, 251-299 - 13 units,  300-349 - 17 units,  350 or above 19 units.   02/25/2023   LANTUS SOLOSTAR 100 UNIT/ML Solostar Pen Inject 36 Units into the skin at bedtime.  02/24/2023   magnesium oxide (MAG-OX) 400 MG tablet Take 800 mg by mouth 3 (three) times daily.   02/25/2023   metoprolol tartrate (LOPRESSOR) 50 MG tablet Take 1 tablet by mouth 2 (two) times daily.   02/25/2023   Multiple Vitamin (MULTIVITAMIN) tablet Take 1 tablet by mouth daily. Women's Daily Multivitamin   02/25/2023   pantoprazole (PROTONIX) 40 MG tablet Take 1 tablet (40 mg total) by mouth daily.   02/25/2023   prednisoLONE acetate (PRED FORTE) 1 % ophthalmic suspension Place 1 drop into both eyes at bedtime.   02/24/2023   rosuvastatin (CRESTOR) 10 MG tablet Take 10 mg by mouth daily.   02/24/2023   traZODone (DESYREL) 50 MG tablet Take 50 mg by mouth at bedtime.   prn at unknown   vitamin B-12 (CYANOCOBALAMIN) 1000 MCG tablet Take 1,000  mcg by mouth daily.   02/24/2023   [DISCONTINUED] ALPRAZolam (XANAX) 0.5 MG tablet Take 0.5 mg by mouth at bedtime.   02/24/2023   blood glucose meter kit and supplies KIT Dispense based on patient and insurance preference. Use up to four times daily as directed. (FOR ICD-9 250.00, 250.01). For QAC - HS accuchecks. 1 each 1    diltiazem (CARDIZEM CD) 180 MG 24 hr capsule Take 1 capsule (180 mg total) by mouth daily.      ferrous sulfate 325 (65 FE) MG tablet Take 325 mg by mouth daily with breakfast. (Patient not taking: Reported on 02/25/2023)   Not Taking   glucose blood (FREESTYLE LITE) test strip For glucose testing every before meals at bedtime. Diagnosis E 11.65  Can substitute to any accepted brand 100 each 0    Insulin Syringe-Needle U-100 25G X 1" 1 ML MISC For 4 times a day insulin SQ, 1 month supply. Diagnosis E11.65 30 each 0    PARoxetine (PAXIL) 10 MG tablet Take 10 mg by mouth daily.      Social History   Socioeconomic History   Marital status: Widowed    Spouse name: Not on file   Number of children: Not on file   Years of education: Not on file   Highest education level: Not on file  Occupational History   Not on file  Tobacco Use   Smoking status: Never   Smokeless tobacco: Never  Vaping Use   Vaping status: Never Used  Substance and Sexual Activity   Alcohol use: Never   Drug use: Never   Sexual activity: Not Currently  Other Topics Concern   Not on file  Social History Narrative   Lives alone, has support from son who is a Optician, dispensing and daughter in Social worker. Will be with mother when she has surgery.   Social Determinants of Health   Financial Resource Strain: Not on file  Food Insecurity: No Food Insecurity (02/25/2023)   Hunger Vital Sign    Worried About Running Out of Food in the Last Year: Never true    Ran Out of Food in the Last Year: Never true  Transportation Needs: No Transportation Needs (02/25/2023)   PRAPARE - Administrator, Civil Service  (Medical): No    Lack of Transportation (Non-Medical): No  Physical Activity: Not on file  Stress: Not on file  Social Connections: Not on file  Intimate Partner Violence: Not At Risk (02/25/2023)   Humiliation, Afraid, Rape, and Kick questionnaire    Fear of Current or Ex-Partner: No    Emotionally Abused: No    Physically Abused: No  Sexually Abused: No    Family History  Problem Relation Age of Onset   Breast cancer Paternal Aunt       Review of systems complete and found to be negative unless listed above      PHYSICAL EXAM  General: Well developed, well nourished, in no acute distress HEENT:  Normocephalic and atramatic Neck:  No JVD.  Lungs: Clear bilaterally to auscultation and percussion. Heart: Irregular rhythm no significant murmur Warm extremities  Labs:   Lab Results  Component Value Date   WBC 6.5 03/04/2023   HGB 14.9 03/04/2023   HCT 45.0 03/04/2023   MCV 89.8 03/04/2023   PLT 229 03/04/2023    Recent Labs  Lab 03/08/23 0342  NA 137  K 3.6  CL 103  CO2 22  BUN 37*  CREATININE 1.12*  CALCIUM 9.4  GLUCOSE 90   No results found for: "CKTOTAL", "CKMB", "CKMBINDEX", "TROPONINI" No results found for: "CHOL" No results found for: "HDL" No results found for: "LDLCALC" No results found for: "TRIG" No results found for: "CHOLHDL" No results found for: "LDLDIRECT"    Radiology: CT Angio Chest PE W/Cm &/Or Wo Cm  Result Date: 02/25/2023 CLINICAL DATA:  Pulmonary embolism (PE) suspected, high prob. Weakness. Shortness of breath. EXAM: CT ANGIOGRAPHY CHEST WITH CONTRAST TECHNIQUE: Multidetector CT imaging of the chest was performed using the standard protocol during bolus administration of intravenous contrast. Multiplanar CT image reconstructions and MIPs were obtained to evaluate the vascular anatomy. RADIATION DOSE REDUCTION: This exam was performed according to the departmental dose-optimization program which includes automated exposure control,  adjustment of the mA and/or kV according to patient size and/or use of iterative reconstruction technique. CONTRAST:  75mL OMNIPAQUE IOHEXOL 350 MG/ML SOLN COMPARISON:  CT angiography chest from 01/30/2023. FINDINGS: Cardiovascular: No evidence of embolism to the proximal subsegmental pulmonary artery level. Mild cardiomegaly. No pericardial effusion. No aortic aneurysm. There are coronary artery calcifications, in keeping with coronary artery disease. There are also mild-to-moderate peripheral atherosclerotic vascular calcifications of thoracic aorta and its major branches. There is dilation of the main pulmonary trunk measuring up to 3.3 cm, which is nonspecific but can be seen with pulmonary artery hypertension. Mediastinum/Nodes: Visualized thyroid gland appears grossly unremarkable. No solid / cystic mediastinal masses. The esophagus is nondistended precluding optimal assessment. There are few mildly prominent mediastinal and hilar lymph nodes, which do not meet the size criteria for lymphadenopathy and appear grossly similar to the prior study, favoring benign etiology. No axillary lymphadenopathy by size criteria. Lungs/Pleura: The central tracheo-bronchial tree is patent. There is mild, smooth, circumferential thickening of the segmental and subsegmental bronchial walls, throughout bilateral lungs, which is nonspecific. Findings are most commonly seen with pulmonary edema, bronchitis or reactive airway disease, such as asthma. Redemonstration of filling defects in the bilateral lower lobe subsegmental bronchi, likely due to mucus/secretions or aspiration. There are patchy areas of smooth interlobular septal thickening throughout bilateral lungs and bilateral trace pleural effusions. There are patchy areas of linear, plate-like atelectasis and/or scarring throughout bilateral lungs. No mass or consolidation. No pneumothorax. No suspicious lung nodules. Upper Abdomen: Visualized upper abdominal viscera within  normal limits. Musculoskeletal: The visualized soft tissues of the chest wall are grossly unremarkable. No suspicious osseous lesions. There are mild multilevel degenerative changes in the visualized spine. Review of the MIP images confirms the above findings. IMPRESSION: *No evidence of pulmonary embolism to the proximal subsegmental pulmonary artery level. *There is mild smooth interlobular septal thickening throughout bilateral lungs with  bilateral trace pleural effusions favoring congestive heart failure/pulmonary edema. *Multiple other nonacute observations, as described above. Electronically Signed   By: Jules Schick M.D.   On: 02/25/2023 14:34   DG Chest Port 1 View  Result Date: 02/25/2023 CLINICAL DATA:  Shortness of breath. EXAM: PORTABLE CHEST 1 VIEW COMPARISON:  Sep 15, 2019. FINDINGS: Mild cardiomegaly is noted. Mild central pulmonary vascular congestion is noted. Minimal bibasilar pulmonary edema is noted. Status post left shoulder arthroplasty. IMPRESSION: Mild cardiomegaly with mild central pulmonary vascular congestion and minimal bibasilar pulmonary edema. Electronically Signed   By: Lupita Raider M.D.   On: 02/25/2023 13:22     ASSESSMENT AND PLAN:  A-fib with RVR, now rate improved Chronic heart failure with preserved EF Hyperlipidemia  Heart rate improved.  Will change Cardizem to 120 mg daily. Continue p.o. metoprolol and renally adjusted digoxin. She did not want to be on anticoagulation at all as detailed in last note. Continue as needed Lasix Cardiology follow-up as outpatient. Will sign off, call with questions  Signed: Kathryne Gin MD,PhD, [facility] 03/08/2023, 11:53 AM

## 2023-03-08 NOTE — TOC Progression Note (Signed)
Transition of Care [facility]) - Progression Note    Patient Details  Name: Samantha Freeman MRN: [id number] Date of Birth: 03-12-1936  Transition of Care [facility]) CM/SW Contact  Truddie Hidden, RN Phone Number: 03/08/2023, 9:35 AM  Clinical Narrative:   Vesta Mixer still pending. MD notified.     Expected Discharge Plan: Skilled Nursing Facility Barriers to Discharge: Continued Medical Work up  Expected Discharge Plan and Services     Post Acute Care Choice: Skilled Nursing Facility Living arrangements for the past 2 months: Apartment Expected Discharge Date: 03/06/23                                     Social Determinants of Health (SDOH) Interventions SDOH Screenings   Food Insecurity: No Food Insecurity (02/25/2023)  Housing: Low Risk  (02/25/2023)  Transportation Needs: No Transportation Needs (02/25/2023)  Utilities: Not At Risk (02/25/2023)  Tobacco Use: Low Risk  (02/25/2023)   Received from [facility]    Readmission Risk Interventions     No data to display

## 2023-03-08 NOTE — TOC Progression Note (Addendum)
Transition of Care [facility]) - Progression Note    Patient Details  Name: Samantha Freeman MRN: [id number] Date of Birth: 1935/05/03  Transition of Care [facility]) CM/SW Contact  Truddie Hidden, RN Phone Number: 03/08/2023, 11:07 AM  Clinical Narrative:    Retrieved a message from representative Marchelle Folks from [facility]. The medical director is offering a peer to peer for request for SNF  @ [id number] option 5. The deadline for the P2P is today at 3pm. MD notified.   3:30pm Per MD patient was denied. Spoke with patient regarding her right to appeal. Patient stated she felt weak and does not have any one in the home to assist her.   4:21pm Spoke with Mardella Layman at [facility]. Offical denial has not been entered by the Well[employer] geologist. Patient can appeal the decision after the denial is issued. Mardella Layman provided contact information for assigned TOC for tomorrow.     Expected Discharge Plan: Skilled Nursing Facility Barriers to Discharge: Continued Medical Work up  Expected Discharge Plan and Services     Post Acute Care Choice: Skilled Nursing Facility Living arrangements for the past 2 months: Apartment Expected Discharge Date: 03/06/23                                     Social Determinants of Health (SDOH) Interventions SDOH Screenings   Food Insecurity: No Food Insecurity (02/25/2023)  Housing: Low Risk  (02/25/2023)  Transportation Needs: No Transportation Needs (02/25/2023)  Utilities: Not At Risk (02/25/2023)  Tobacco Use: Low Risk  (02/25/2023)   Received from [facility]    Readmission Risk Interventions     No data to display

## 2023-03-09 DIAGNOSIS — I4891 Unspecified atrial fibrillation: Secondary | ICD-10-CM

## 2023-03-09 LAB — MAGNESIUM: Magnesium: 2 mg/dL (ref 1.7–2.4)

## 2023-03-09 LAB — BASIC METABOLIC PANEL
Anion gap: 13 (ref 5–15)
BUN: 39 mg/dL — ABNORMAL HIGH (ref 8–23)
CO2: 24 mmol/L (ref 22–32)
Calcium: 9.4 mg/dL (ref 8.9–10.3)
Chloride: 100 mmol/L (ref 98–111)
Creatinine, Ser: 1.12 mg/dL — ABNORMAL HIGH (ref 0.44–1.00)
GFR, Estimated: 48 mL/min — ABNORMAL LOW (ref >60)
Glucose, Bld: 147 mg/dL — ABNORMAL HIGH (ref 70–99)
Potassium: 3.7 mmol/L (ref 3.5–5.1)
Sodium: 137 mmol/L (ref 135–145)

## 2023-03-09 LAB — GLUCOSE, CAPILLARY
Glucose-Capillary: 153 mg/dL — ABNORMAL HIGH (ref 70–99)
Glucose-Capillary: 269 mg/dL — ABNORMAL HIGH (ref 70–99)
Glucose-Capillary: 281 mg/dL — ABNORMAL HIGH (ref 70–99)
Glucose-Capillary: 210 mg/dL — ABNORMAL HIGH (ref 70–99)

## 2023-03-09 NOTE — Progress Notes (Signed)
SUBJECTIVE: Patient is feeling much better compared to when she first came and denies any chest pain or shortness of breath.  But very anxious to go home as she worries that she may get worse again.   Vitals:   03/08/23 1400 03/08/23 2000 03/09/23 0537 03/09/23 0757  BP: 121/80 113/88 120/68 125/74  Pulse: 79 79 (!) 58 64  Resp:  18 18 17   Temp: 97.8 F (36.6 C) 98 F (36.7 C) (!) 97.5 F (36.4 C)   TempSrc: Oral  Oral   SpO2:  93% (!) 89% 96%  Weight:        Intake/Output Summary (Last 24 hours) at 03/09/2023 1104 Last data filed at 03/08/2023 2200 Gross per 24 hour  Intake 480 ml  Output --  Net 480 ml    LABS: Basic Metabolic Panel: Recent Labs    03/08/23 0342 03/09/23 0409  NA 137 137  K 3.6 3.7  CL 103 100  CO2 22 24  GLUCOSE 90 147*  BUN 37* 39*  CREATININE 1.12* 1.12*  CALCIUM 9.4 9.4  MG 1.8 2.0   Liver Function Tests: No results for input(s): "AST", "ALT", "ALKPHOS", "BILITOT", "PROT", "ALBUMIN" in the last 72 hours. No results for input(s): "LIPASE", "AMYLASE" in the last 72 hours. CBC: No results for input(s): "WBC", "NEUTROABS", "HGB", "HCT", "MCV", "PLT" in the last 72 hours. Cardiac Enzymes: No results for input(s): "CKTOTAL", "CKMB", "CKMBINDEX", "TROPONINI" in the last 72 hours. BNP: Invalid input(s): "POCBNP" D-Dimer: No results for input(s): "DDIMER" in the last 72 hours. Hemoglobin A1C: No results for input(s): "HGBA1C" in the last 72 hours. Fasting Lipid Panel: No results for input(s): "CHOL", "HDL", "LDLCALC", "TRIG", "CHOLHDL", "LDLDIRECT" in the last 72 hours. Thyroid Function Tests: No results for input(s): "TSH", "T4TOTAL", "T3FREE", "THYROIDAB" in the last 72 hours.  Invalid input(s): "FREET3" Anemia Panel: No results for input(s): "VITAMINB12", "FOLATE", "FERRITIN", "TIBC", "IRON", "RETICCTPCT" in the last 72 hours.   PHYSICAL EXAM General: Well developed, well nourished, in no acute distress HEENT:  Normocephalic and  atramatic Neck:  No JVD.  Lungs: Clear bilaterally to auscultation and percussion. Heart: HRRR . Normal S1 and S2 without gallops or murmurs.  Abdomen: Bowel sounds are positive, abdomen soft and non-tender  Msk:  Back normal, normal gait. Normal strength and tone for age. Extremities: No clubbing, cyanosis or edema.   Neuro: Alert and oriented X 3. Psych:  Good affect, responds appropriately  TELEMETRY: Atrial fibrillation with controlled ventricular rate about 70 to 80 bpm  ASSESSMENT AND PLAN: Atrial fibrillation with controlled ventricular rate.  Patient did not want to be anticoagulated.  Cardiac point of view patient can be continued on current dose of digoxin metoprolol and Cardizem.  Advise outpatient follow-up.   ICD-10-CM   1. Generalized weakness  R53.1     2. Other fatigue  R53.83     3. Exertional dyspnea  R06.09     4. Atrial fibrillation with rapid ventricular response (HCC)  I48.91     5. Nonspecific chest pain  R07.9     6. Acute pulmonary edema (HCC)  J81.0     7. Type 2 diabetes mellitus with other specified complication, with long-term current use of insulin (HCC)  E11.69 Diet Carb Modified   Z79.4       Principal Problem:   Atrial fibrillation with RVR (HCC) Active Problems:   Chronic kidney disease, stage 3a (HCC)   DM (diabetes mellitus) (HCC)   HTN (hypertension)   Acute hypoxic respiratory  failure (HCC)   Anxiety and depression   Acute heart failure with preserved ejection fraction (HFpEF) (HCC)   Generalized weakness    Adrian Blackwater, MD, [facility] 03/09/2023 11:04 AM

## 2023-03-09 NOTE — Progress Notes (Signed)
PROGRESS NOTE    Samantha Freeman  EAV:[id number] DOB: 1935/06/07 DOA: 02/25/2023 PCP: Marguarite Arbour, MD    Assessment & Plan:   Principal Problem:   Atrial fibrillation with RVR (HCC) Active Problems:   Acute hypoxic respiratory failure (HCC)   Acute heart failure with preserved ejection fraction (HFpEF) (HCC)   Chronic kidney disease, stage 3a (HCC)   DM (diabetes mellitus) (HCC)   HTN (hypertension)   Anxiety and depression   Generalized weakness  Assessment and Plan:  Atrial fibrillation: with RVR. Presenting with ongoing weakness, fatigue, DOB with acute worsening over the last 1-2 days.  Patient stopped anticoagulation after developing epistaxis requiring packing, and declined anticoagulation. Afib was first dx during Oct hospitalization. Continue on diltiazem, metoprolol, digoxin as per cardio. D/c enalapril, amlodipine as per cardio   Acute hypoxic respiratory failure: has mild pulmonary edema likely triggered by atrial fibrillation with RVR.  CTA without PE.  No signs of infectious process. S/p IV lasix 40 daily x 4. Pt reported her baseline O2 sats are low, and denied dyspnea or need for supplemental oxygen. Weaned off of supplemental oxygen. Resolved     Acute heart failure with preserved ejection fraction: likely triggered by atrial fibrillation with RVR.  BNP is elevated at 512, higher than previous admission. S/p IV lasix.   Continue on  metoprolol, farxiga as per cardio    Generalized weakness: PT/OT recs SNF.  Pt's insurance denied SNF now even after peer to peer. Pt is appealing denial    Depression: severity unknown. Continue on home dose of paroxetine, trazodone    Anxiety: severity unknown. Continue on home dose of xanax    HTN: continue on metoprolol. D/c amlodipine, enalapril as per cardio    DM2: poor controlled. Continue on glargine, SSI w/ accuchecks, farxiga   AKI on CKDIIIa: Cr is stable. Avoid nephrotoxic meds    Hypokalemia: resolved      Hyponatremia: resolved   Obesity: BMI 29.9. Would benefit from weight loss      DVT prophylaxis: lovenox  Code Status: DNR Family Communication:  Disposition Plan: unclear. Pt's insurance denied SNF now even after peer to peer. Pt is appealing the denial   Level of care: Telemetry Cardiac  Status is: Inpatient Remains inpatient appropriate because: Pt is appealing the denial of SNF     Consultants:  Cardio   Procedures:  Antimicrobials:    Subjective: Pt c/o malaise    Objective: Vitals:   03/08/23 2000 03/09/23 0537 03/09/23 0757 03/09/23 1121  BP: 113/88 120/68 125/74 (!) 145/84  Pulse: 79 (!) 58 64 71  Resp: 18 18 17 16   Temp: 98 F (36.7 C) (!) 97.5 F (36.4 C)  97.6 F (36.4 C)  TempSrc:  Oral    SpO2: 93% (!) 89% 96% 95%  Weight:        Intake/Output Summary (Last 24 hours) at 03/09/2023 1503 Last data filed at 03/09/2023 1118 Gross per 24 hour  Intake 960 ml  Output --  Net 960 ml    Filed Weights   02/28/23 0802 03/01/23 0745 03/03/23 0500  Weight: 76.4 kg 79.4 kg 79 kg    Examination:  General exam: appears calm & comfortable  Respiratory system: clear breath sounds b/l  Cardiovascular system: irregularly irregular  Gastrointestinal system: abd is soft, NT, ND & normal bowel sounds  Central nervous system: alert & oriented. Moves all extremities  Psychiatry: Judgement and insight appears normal. Appropriate mood and affect    Data Reviewed:  I have personally reviewed following labs and imaging studies  CBC: Recent Labs  Lab 03/03/23 0413 03/04/23 0320  WBC 6.7 6.5  HGB 14.6 14.9  HCT 45.2 45.0  MCV 90.8 89.8  PLT 244 229   Basic Metabolic Panel: Recent Labs  Lab 03/05/23 0304 03/06/23 0401 03/07/23 0418 03/08/23 0342 03/09/23 0409  NA 137 136 136 137 137  K 4.0 3.9 3.6 3.6 3.7  CL 99 100 100 103 100  CO2 28 25 24 22 24   GLUCOSE 192* 236* 174* 90 147*  BUN 43* 38* 35* 37* 39*  CREATININE 1.41* 1.32* 1.01* 1.12*  1.12*  CALCIUM 9.3 9.2 9.3 9.4 9.4  MG 2.0 2.0 1.8 1.8 2.0   GFR: Estimated Creatinine Clearance: 37.3 mL/min (A) (by C-G formula based on SCr of 1.12 mg/dL (H)). Liver Function Tests: No results for input(s): "AST", "ALT", "ALKPHOS", "BILITOT", "PROT", "ALBUMIN" in the last 168 hours. No results for input(s): "LIPASE", "AMYLASE" in the last 168 hours. No results for input(s): "AMMONIA" in the last 168 hours. Coagulation Profile: No results for input(s): "INR", "PROTIME" in the last 168 hours. Cardiac Enzymes: No results for input(s): "CKTOTAL", "CKMB", "CKMBINDEX", "TROPONINI" in the last 168 hours. BNP (last 3 results) No results for input(s): "PROBNP" in the last 8760 hours. HbA1C: No results for input(s): "HGBA1C" in the last 72 hours. CBG: Recent Labs  Lab 03/08/23 0756 03/08/23 1119 03/08/23 1558 03/09/23 0759 03/09/23 1128  GLUCAP 102* 229* 215* 153* 269*   Lipid Profile: No results for input(s): "CHOL", "HDL", "LDLCALC", "TRIG", "CHOLHDL", "LDLDIRECT" in the last 72 hours. Thyroid Function Tests: No results for input(s): "TSH", "T4TOTAL", "FREET4", "T3FREE", "THYROIDAB" in the last 72 hours. Anemia Panel: No results for input(s): "VITAMINB12", "FOLATE", "FERRITIN", "TIBC", "IRON", "RETICCTPCT" in the last 72 hours. Sepsis Labs: No results for input(s): "PROCALCITON", "LATICACIDVEN" in the last 168 hours.  No results found for this or any previous visit (from the past 240 hour(s)).       Radiology Studies: No results found.      Scheduled Meds:  ALPRAZolam  0.5 mg Oral QHS   aspirin EC  81 mg Oral Daily   cyanocobalamin  1,000 mcg Oral Daily   dapagliflozin propanediol  10 mg Oral Daily   digoxin  0.125 mg Oral QODAY   diltiazem  120 mg Oral Daily   enoxaparin (LOVENOX) injection  30 mg Subcutaneous Q24H   feeding supplement (GLUCERNA SHAKE)  237 mL Oral BID BM   fenofibrate  54 mg Oral Daily   gabapentin  200 mg Oral TID   hydrOXYzine  25 mg Oral  BID   insulin aspart  0-15 Units Subcutaneous TID WC   insulin aspart  15 Units Subcutaneous TID WC   insulin glargine-yfgn  40 Units Subcutaneous QHS   metoprolol tartrate  50 mg Oral BID   multivitamin with minerals  1 tablet Oral Daily   pantoprazole  40 mg Oral Daily   prednisoLONE acetate  1 drop Both Eyes QHS   rOPINIRole  4 mg Oral QHS   rosuvastatin  10 mg Oral QHS   Continuous Infusions:   LOS: 12 days      Charise Killian, MD [facility] Pager [phone]  If 7PM-7AM, please contact night-coverage [email or web address]  03/09/2023, 3:03 PM

## 2023-03-09 NOTE — TOC Progression Note (Addendum)
Transition of Care [facility]) - Progression Note    Patient Details  Name: Samantha Freeman MRN: [id number] Date of Birth: [birth year]  Transition of Care [facility]) CM/SW Contact  Bing Quarry, RN Phone Number: 03/09/2023, 12:16 PM  Clinical Narrative:  11/16: Patient wishes to appeal SNF denial after P2P on 03/08/23. Patient had not received a copy of her denial letter she stated. A copy was provided from the [place] site and the information for Fast Appeal was discussed with patient and the number given along with her member number as she did not have that with her. Updated provider and Unit RN. Left a note in [employer] portal as an update to that denial encounter.   For a Fast Appeal: Phone: [phone] TTY Users call: [phone] Fax: [phone] [email or web address]  Gabriel Cirri MSN RN CM  Care Management Department.  [facility] Direct Dial: [phone] Main Office Phone: [phone] Weekends Only    Expected Discharge Plan: Skilled Nursing Facility Barriers to Discharge: Continued Medical Work up  Expected Discharge Plan and Services     Post Acute Care Choice: Skilled Nursing Facility Living arrangements for the past 2 months: Apartment Expected Discharge Date: 03/06/23                                     Social Determinants of Health (SDOH) Interventions SDOH Screenings   Food Insecurity: No Food Insecurity (02/25/2023)  Housing: Low Risk  (02/25/2023)  Transportation Needs: No Transportation Needs (02/25/2023)  Utilities: Not At Risk (02/25/2023)  Tobacco Use: Low Risk  (02/25/2023)   Received from [facility]    Readmission Risk Interventions     No data to display

## 2023-03-10 DIAGNOSIS — I4891 Unspecified atrial fibrillation: Secondary | ICD-10-CM | POA: Diagnosis not present

## 2023-03-10 LAB — GLUCOSE, CAPILLARY
Glucose-Capillary: 145 mg/dL — ABNORMAL HIGH (ref 70–99)
Glucose-Capillary: 240 mg/dL — ABNORMAL HIGH (ref 70–99)
Glucose-Capillary: 261 mg/dL — ABNORMAL HIGH (ref 70–99)
Glucose-Capillary: 245 mg/dL — ABNORMAL HIGH (ref 70–99)

## 2023-03-10 NOTE — Plan of Care (Signed)
  Problem: Education: Goal: Ability to describe self-care measures that may prevent or decrease complications (Diabetes Survival Skills Education) will improve Outcome: Progressing Goal: Individualized Educational Video(s) Outcome: Progressing   Problem: Education: Goal: Individualized Educational Video(s) Outcome: Progressing   Problem: Coping: Goal: Ability to adjust to condition or change in health will improve Outcome: Progressing   

## 2023-03-10 NOTE — Progress Notes (Signed)
PROGRESS NOTE    Samantha Freeman  QIO:[id number] DOB: [birth year] DOA: 02/25/2023 PCP: Marguarite Arbour, MD    Assessment & Plan:   Principal Problem:   Atrial fibrillation with RVR (HCC) Active Problems:   Acute hypoxic respiratory failure (HCC)   Acute heart failure with preserved ejection fraction (HFpEF) (HCC)   Chronic kidney disease, stage 3a (HCC)   DM (diabetes mellitus) (HCC)   HTN (hypertension)   Anxiety and depression   Generalized weakness  Assessment and Plan:  Atrial fibrillation: with RVR. Presenting with ongoing weakness, fatigue, DOB with acute worsening over the last 1-2 days.  Patient stopped anticoagulation after developing epistaxis requiring packing, and declined anticoagulation. Afib was first dx during Oct hospitalization.Continue on digoxin, diltiazem, metoprolol as per cardio. D/c amlodipine, enalapril as per cardio    Acute hypoxic respiratory failure: has mild pulmonary edema likely triggered by atrial fibrillation with RVR.  CTA without PE.  No signs of infectious process. S/p IV lasix 40 daily x 4. Pt reported her baseline O2 sats are low, and denied dyspnea or need for supplemental oxygen. Weaned off of supplemental oxygen. Resolved     Acute heart failure with preserved ejection fraction: likely triggered by atrial fibrillation with RVR.  BNP is elevated at 512, higher than previous admission. S/p IV lasix.   Continue on metoprolol, faxiga as per cardio    Generalized weakness: PT/OT recs SNF.  Pt's insurance denied SNF now even after peer to peer. Pt is appealing denial    Depression: severity unknown. Continue on home dose of paroxetine, trazodone    Anxiety: severity unknown. Continue on home dose of xanax   HTN: continue on BB. D/c amlodipine, enalapril as per cardio    DM2: poor controlled. Continue on glarine, SSI w/ accuchecks, farxiga   AKI on CKDIIIa: Cr is stable. Avoid nephrotoxic meds    Hypokalemia: resolved     Hyponatremia:  resolved   Obesity: BMI 29.9. Would benefit from weight loss      DVT prophylaxis: lovenox  Code Status: DNR Family Communication:  Disposition Plan: unclear. Pt's insurance denied SNF now even after peer to peer. Pt is appealing the denial   Level of care: Telemetry Cardiac  Status is: Inpatient Remains inpatient appropriate because: Pt is appealing the denial of SNF     Consultants:  Cardio   Procedures:  Antimicrobials:    Subjective: Pt c/o fatigue   Objective: Vitals:   03/09/23 2000 03/09/23 2335 03/10/23 0419 03/10/23 0818  BP: 100/70 104/87 (!) 86/67 107/69  Pulse: 76 (!) 145 71 63  Resp: 20 19 20 20   Temp: 98.5 F (36.9 C) 98.2 F (36.8 C) 97.8 F (36.6 C) 97.7 F (36.5 C)  TempSrc: Oral Oral Oral Oral  SpO2: 93% 90% 91% 97%  Weight:        Intake/Output Summary (Last 24 hours) at 03/10/2023 0925 Last data filed at 03/09/2023 1118 Gross per 24 hour  Intake 480 ml  Output --  Net 480 ml    Filed Weights   02/28/23 0802 03/01/23 0745 03/03/23 0500  Weight: 76.4 kg 79.4 kg 79 kg    Examination:  General exam: appears comfortable  Respiratory system: clear breath sounds b/l  Cardiovascular system: irregularly irregular  Gastrointestinal system: abd is soft, NT, ND & hypoactive bowel sounds Central nervous system: alert & oriented. Moves all extremities  Psychiatry: judgement and insight appears normal. Appropriate mood and affect    Data Reviewed: I have personally reviewed following  labs and imaging studies  CBC: Recent Labs  Lab 03/04/23 0320  WBC 6.5  HGB 14.9  HCT 45.0  MCV 89.8  PLT 229   Basic Metabolic Panel: Recent Labs  Lab 03/05/23 0304 03/06/23 0401 03/07/23 0418 03/08/23 0342 03/09/23 0409  NA 137 136 136 137 137  K 4.0 3.9 3.6 3.6 3.7  CL 99 100 100 103 100  CO2 28 25 24 22 24   GLUCOSE 192* 236* 174* 90 147*  BUN 43* 38* 35* 37* 39*  CREATININE 1.41* 1.32* 1.01* 1.12* 1.12*  CALCIUM 9.3 9.2 9.3 9.4 9.4   MG 2.0 2.0 1.8 1.8 2.0   GFR: Estimated Creatinine Clearance: 37.3 mL/min (A) (by C-G formula based on SCr of 1.12 mg/dL (H)). Liver Function Tests: No results for input(s): "AST", "ALT", "ALKPHOS", "BILITOT", "PROT", "ALBUMIN" in the last 168 hours. No results for input(s): "LIPASE", "AMYLASE" in the last 168 hours. No results for input(s): "AMMONIA" in the last 168 hours. Coagulation Profile: No results for input(s): "INR", "PROTIME" in the last 168 hours. Cardiac Enzymes: No results for input(s): "CKTOTAL", "CKMB", "CKMBINDEX", "TROPONINI" in the last 168 hours. BNP (last 3 results) No results for input(s): "PROBNP" in the last 8760 hours. HbA1C: No results for input(s): "HGBA1C" in the last 72 hours. CBG: Recent Labs  Lab 03/09/23 0759 03/09/23 1128 03/09/23 1703 03/09/23 2036 03/10/23 0742  GLUCAP 153* 269* 281* 210* 145*   Lipid Profile: No results for input(s): "CHOL", "HDL", "LDLCALC", "TRIG", "CHOLHDL", "LDLDIRECT" in the last 72 hours. Thyroid Function Tests: No results for input(s): "TSH", "T4TOTAL", "FREET4", "T3FREE", "THYROIDAB" in the last 72 hours. Anemia Panel: No results for input(s): "VITAMINB12", "FOLATE", "FERRITIN", "TIBC", "IRON", "RETICCTPCT" in the last 72 hours. Sepsis Labs: No results for input(s): "PROCALCITON", "LATICACIDVEN" in the last 168 hours.  No results found for this or any previous visit (from the past 240 hour(s)).       Radiology Studies: No results found.      Scheduled Meds:  ALPRAZolam  0.5 mg Oral QHS   aspirin EC  81 mg Oral Daily   cyanocobalamin  1,000 mcg Oral Daily   dapagliflozin propanediol  10 mg Oral Daily   digoxin  0.125 mg Oral QODAY   diltiazem  120 mg Oral Daily   enoxaparin (LOVENOX) injection  30 mg Subcutaneous Q24H   feeding supplement (GLUCERNA SHAKE)  237 mL Oral BID BM   fenofibrate  54 mg Oral Daily   gabapentin  200 mg Oral TID   hydrOXYzine  25 mg Oral BID   insulin aspart  0-15 Units  Subcutaneous TID WC   insulin aspart  15 Units Subcutaneous TID WC   insulin glargine-yfgn  40 Units Subcutaneous QHS   metoprolol tartrate  50 mg Oral BID   multivitamin with minerals  1 tablet Oral Daily   pantoprazole  40 mg Oral Daily   prednisoLONE acetate  1 drop Both Eyes QHS   rOPINIRole  4 mg Oral QHS   rosuvastatin  10 mg Oral QHS   Continuous Infusions:   LOS: 13 days      Charise Killian, MD [facility] Pager [phone]  If 7PM-7AM, please contact night-coverage [email or web address]  03/10/2023, 9:25 AM

## 2023-03-10 NOTE — Plan of Care (Signed)

## 2023-03-10 NOTE — Progress Notes (Signed)
SUBJECTIVE: Patient lying in bed comfortably denies any chest pain or shortness of breath   Vitals:   03/09/23 2000 03/09/23 2335 03/10/23 0419 03/10/23 0818  BP: 100/70 104/87 (!) 86/67 107/69  Pulse: 76 (!) 145 71 63  Resp: 20 19 20 20   Temp: 98.5 F (36.9 C) 98.2 F (36.8 C) 97.8 F (36.6 C) 97.7 F (36.5 C)  TempSrc: Oral Oral Oral Oral  SpO2: 93% 90% 91% 97%  Weight:        Intake/Output Summary (Last 24 hours) at 03/10/2023 0831 Last data filed at 03/09/2023 1118 Gross per 24 hour  Intake 480 ml  Output --  Net 480 ml    LABS: Basic Metabolic Panel: Recent Labs    03/08/23 0342 03/09/23 0409  NA 137 137  K 3.6 3.7  CL 103 100  CO2 22 24  GLUCOSE 90 147*  BUN 37* 39*  CREATININE 1.12* 1.12*  CALCIUM 9.4 9.4  MG 1.8 2.0   Liver Function Tests: No results for input(s): "AST", "ALT", "ALKPHOS", "BILITOT", "PROT", "ALBUMIN" in the last 72 hours. No results for input(s): "LIPASE", "AMYLASE" in the last 72 hours. CBC: No results for input(s): "WBC", "NEUTROABS", "HGB", "HCT", "MCV", "PLT" in the last 72 hours. Cardiac Enzymes: No results for input(s): "CKTOTAL", "CKMB", "CKMBINDEX", "TROPONINI" in the last 72 hours. BNP: Invalid input(s): "POCBNP" D-Dimer: No results for input(s): "DDIMER" in the last 72 hours. Hemoglobin A1C: No results for input(s): "HGBA1C" in the last 72 hours. Fasting Lipid Panel: No results for input(s): "CHOL", "HDL", "LDLCALC", "TRIG", "CHOLHDL", "LDLDIRECT" in the last 72 hours. Thyroid Function Tests: No results for input(s): "TSH", "T4TOTAL", "T3FREE", "THYROIDAB" in the last 72 hours.  Invalid input(s): "FREET3" Anemia Panel: No results for input(s): "VITAMINB12", "FOLATE", "FERRITIN", "TIBC", "IRON", "RETICCTPCT" in the last 72 hours.   PHYSICAL EXAM General: Well developed, well nourished, in no acute distress HEENT:  Normocephalic and atramatic Neck:  No JVD.  Lungs: Clear bilaterally to auscultation and  percussion. Heart: HRRR . Normal S1 and S2 without gallops or murmurs.  Abdomen: Bowel sounds are positive, abdomen soft and non-tender  Msk:  Back normal, normal gait. Normal strength and tone for age. Extremities: No clubbing, cyanosis or edema.   Neuro: Alert and oriented X 3. Psych:  Good affect, responds appropriately  TELEMETRY: Atrial fibrillation with ventricular rate about 100  ASSESSMENT AND PLAN: Atrial fibrillation with history of rapid ventricular response rate.  Patient did not want to be anticoagulated.  Patient is very anxious and is not happy about going home.  Advise continuing current dosage of digoxin metoprolol and Cardizem.  Follow-up with [facility] cardiology as an outpatient 1 to 2 weeks.   ICD-10-CM   1. Generalized weakness  R53.1     2. Other fatigue  R53.83     3. Exertional dyspnea  R06.09     4. Atrial fibrillation with rapid ventricular response (HCC)  I48.91     5. Nonspecific chest pain  R07.9     6. Acute pulmonary edema (HCC)  J81.0     7. Type 2 diabetes mellitus with other specified complication, with long-term current use of insulin (HCC)  E11.69 Diet Carb Modified   Z79.4       Principal Problem:   Atrial fibrillation with RVR (HCC) Active Problems:   Chronic kidney disease, stage 3a (HCC)   DM (diabetes mellitus) (HCC)   HTN (hypertension)   Acute hypoxic respiratory failure (HCC)   Anxiety and depression  Acute heart failure with preserved ejection fraction (HFpEF) (HCC)   Generalized weakness    Adrian Blackwater, MD, [facility] 03/10/2023 8:31 AM

## 2023-03-11 DIAGNOSIS — I4891 Unspecified atrial fibrillation: Secondary | ICD-10-CM | POA: Diagnosis not present

## 2023-03-11 LAB — GLUCOSE, CAPILLARY
Glucose-Capillary: 220 mg/dL — ABNORMAL HIGH (ref 70–99)
Glucose-Capillary: 315 mg/dL — ABNORMAL HIGH (ref 70–99)
Glucose-Capillary: 168 mg/dL — ABNORMAL HIGH (ref 70–99)
Glucose-Capillary: 180 mg/dL — ABNORMAL HIGH (ref 70–99)

## 2023-03-11 MED ORDER — INSULIN GLARGINE-YFGN 100 UNIT/ML ~~LOC~~ SOLN
45.0000 [IU] | Freq: Every day | SUBCUTANEOUS | Status: DC
  Administered 2023-03-11: 45 [IU] via SUBCUTANEOUS
  Filled 2023-03-11 (×2): qty 0.45

## 2023-03-11 MED ORDER — INSULIN ASPART 100 UNIT/ML IJ SOLN
18.0000 [IU] | Freq: Three times a day (TID) | INTRAMUSCULAR | Status: DC
  Administered 2023-03-11 – 2023-03-12 (×3): 18 [IU] via SUBCUTANEOUS
  Filled 2023-03-11 (×3): qty 1

## 2023-03-11 MED ORDER — DILTIAZEM HCL ER COATED BEADS 180 MG PO CP24
180.0000 mg | ORAL_CAPSULE | Freq: Every day | ORAL | Status: DC
  Administered 2023-03-12: 180 mg via ORAL
  Filled 2023-03-11: qty 1

## 2023-03-11 NOTE — TOC Progression Note (Signed)
Transition of Care [facility]) - Progression Note    Patient Details  Name: Samantha Freeman MRN: [id number] Date of Birth: 04-22-1936  Transition of Care [facility]) CM/SW Contact  Truddie Hidden, RN Phone Number: 03/11/2023, 11:31 AM  Clinical Narrative:    RNCM spoke with Barton Fanny from [facility] @[phone] regarding appeal for SNF.Marland Kitchen Per Inetta Fermo the appeal was filed 11/16 a determination should be made within 72 hours after the appeal was filed. This decision will be given to the patient. MD notified.     Expected Discharge Plan: Skilled Nursing Facility Barriers to Discharge: Continued Medical Work up  Expected Discharge Plan and Services     Post Acute Care Choice: Skilled Nursing Facility Living arrangements for the past 2 months: Apartment Expected Discharge Date: 03/06/23                                     Social Determinants of Health (SDOH) Interventions SDOH Screenings   Food Insecurity: No Food Insecurity (02/25/2023)  Housing: Low Risk  (02/25/2023)  Transportation Needs: No Transportation Needs (02/25/2023)  Utilities: Not At Risk (02/25/2023)  Tobacco Use: Low Risk  (02/25/2023)   Received from [facility]    Readmission Risk Interventions     No data to display

## 2023-03-11 NOTE — Inpatient Diabetes Management (Signed)
Inpatient Diabetes Program Recommendations  AACE/ADA: New Consensus Statement on Inpatient Glycemic Control (2015)  Target Ranges:  Prepandial:   less than 140 mg/dL      Peak postprandial:   less than 180 mg/dL (1-2 hours)      Critically ill patients:  140 - 180 mg/dL   Lab Results  Component Value Date   GLUCAP 315 (H) 03/11/2023   HGBA1C 7.6 (H) 01/31/2023    Home DM Meds: Lantus 36 units at bedtime     Humalog 7-19 units TID     Farxiga 10 mg daily      Current Orders: Semglee 40 units at bedtime                           Novolog Moderate Correction Scale/ SSI (0-15 units) TID AC                           Novolog 15 units TID with meals                           Farxiga 10 mg daily     MD- Note CBG 220 this AM.  Please consider:   1. Increase Semglee to 45 units at bedtime   2. Increase Novolog Meal Coverage to 18 units TID with meals  Thank you, Billy Fischer. Tonica Brasington, RN, MSN, CDCES  Diabetes Coordinator Inpatient Glycemic Control Team Team Pager [phone] (8am-5pm) 03/11/2023 12:42 PM

## 2023-03-11 NOTE — Progress Notes (Signed)
PROGRESS NOTE    Samantha Freeman  GNF:[id number] DOB: [birth year] DOA: 02/25/2023 PCP: Marguarite Arbour, MD    Assessment & Plan:   Principal Problem:   Atrial fibrillation with RVR (HCC) Active Problems:   Acute hypoxic respiratory failure (HCC)   Acute heart failure with preserved ejection fraction (HFpEF) (HCC)   Chronic kidney disease, stage 3a (HCC)   DM (diabetes mellitus) (HCC)   HTN (hypertension)   Anxiety and depression   Generalized weakness  Assessment and Plan:  Atrial fibrillation: with RVR. Presenting with ongoing weakness, fatigue, DOB with acute worsening over the last 1-2 days.  Patient stopped anticoagulation after developing epistaxis requiring packing, and declined anticoagulation. Afib was first dx during Oct hospitalization. Continue on metoprolol, digoxin, diltiazem as per cardio. D/c enalapril, amlodipine as per cardio   Acute hypoxic respiratory failure: has mild pulmonary edema likely triggered by atrial fibrillation with RVR.  CTA without PE.  No signs of infectious process. S/p IV lasix 40 daily x 4. Pt reported her baseline O2 sats are low, and denied dyspnea or need for supplemental oxygen. Weaned off of supplemental oxygen. Resolved     Acute heart failure with preserved ejection fraction: likely triggered by atrial fibrillation with RVR.  BNP is elevated at 512, higher than previous admission. S/p IV lasix.   Continue on metoprolol, farxiga as per cardio    Generalized weakness: PT/OT recs SNF.  Pt's insurance denied SNF now even after peer to peer. Denial was appealed and that is pending currently    Depression: severity unknown. Continue on home dose of trazodone, paroxetine     Anxiety: severity unknown. Continue on home dose of xanax   HTN: continue on metoprolol. D/c amlodipine, enalapril as per cardio    DM2: poor controlled. Continue on glargine, SSI w/ accuchecks, farxiga   AKI on CKDIIIa: Cr is stable. Avoid nephrotoxic meds     Hypokalemia: resolved     Hyponatremia: resolved   Obesity: BMI 29.9. Would benefit from weight loss      DVT prophylaxis: lovenox  Code Status: DNR Family Communication:  Disposition Plan: unclear. Pt's insurance denied SNF now even after peer to peer. Denial was appealed and that is pending currently   Level of care: Telemetry Cardiac  Status is: Inpatient Remains inpatient appropriate because: Denial was appealed and that is pending currently     Consultants:  Cardio   Procedures:  Antimicrobials:    Subjective: Pt c/o generalized weakness  Objective: Vitals:   03/11/23 0148 03/11/23 0357 03/11/23 0800 03/11/23 0807  BP:  94/65  118/82  Pulse:  72  78  Resp:  19 19   Temp:  98 F (36.7 C)  97.7 F (36.5 C)  TempSrc:      SpO2:  92%  93%  Weight:      Height: 5\' 4"  (1.626 m)       Intake/Output Summary (Last 24 hours) at 03/11/2023 1104 Last data filed at 03/11/2023 1051 Gross per 24 hour  Intake 840 ml  Output --  Net 840 ml    Filed Weights   02/28/23 0802 03/01/23 0745 03/03/23 0500  Weight: 76.4 kg 79.4 kg 79 kg    Examination:  General exam: appears calm & comfortable  Respiratory system: clear breath sounds b/l Cardiovascular system: irregularly irregular  Gastrointestinal system: abd is soft, NT, ND & hypoactive bowel sounds  Central nervous system: alert & oriented. Moves all extremities  Psychiatry: judgement and insight appears normal. Appropriate mood  and affect    Data Reviewed: I have personally reviewed following labs and imaging studies  CBC: No results for input(s): "WBC", "NEUTROABS", "HGB", "HCT", "MCV", "PLT" in the last 168 hours.  Basic Metabolic Panel: Recent Labs  Lab 03/05/23 0304 03/06/23 0401 03/07/23 0418 03/08/23 0342 03/09/23 0409  NA 137 136 136 137 137  K 4.0 3.9 3.6 3.6 3.7  CL 99 100 100 103 100  CO2 28 25 24 22 24   GLUCOSE 192* 236* 174* 90 147*  BUN 43* 38* 35* 37* 39*  CREATININE 1.41*  1.32* 1.01* 1.12* 1.12*  CALCIUM 9.3 9.2 9.3 9.4 9.4  MG 2.0 2.0 1.8 1.8 2.0   GFR: Estimated Creatinine Clearance: 37.3 mL/min (A) (by C-G formula based on SCr of 1.12 mg/dL (H)). Liver Function Tests: No results for input(s): "AST", "ALT", "ALKPHOS", "BILITOT", "PROT", "ALBUMIN" in the last 168 hours. No results for input(s): "LIPASE", "AMYLASE" in the last 168 hours. No results for input(s): "AMMONIA" in the last 168 hours. Coagulation Profile: No results for input(s): "INR", "PROTIME" in the last 168 hours. Cardiac Enzymes: No results for input(s): "CKTOTAL", "CKMB", "CKMBINDEX", "TROPONINI" in the last 168 hours. BNP (last 3 results) No results for input(s): "PROBNP" in the last 8760 hours. HbA1C: No results for input(s): "HGBA1C" in the last 72 hours. CBG: Recent Labs  Lab 03/10/23 0742 03/10/23 1212 03/10/23 1730 03/10/23 2121 03/11/23 0809  GLUCAP 145* 240* 261* 245* 220*   Lipid Profile: No results for input(s): "CHOL", "HDL", "LDLCALC", "TRIG", "CHOLHDL", "LDLDIRECT" in the last 72 hours. Thyroid Function Tests: No results for input(s): "TSH", "T4TOTAL", "FREET4", "T3FREE", "THYROIDAB" in the last 72 hours. Anemia Panel: No results for input(s): "VITAMINB12", "FOLATE", "FERRITIN", "TIBC", "IRON", "RETICCTPCT" in the last 72 hours. Sepsis Labs: No results for input(s): "PROCALCITON", "LATICACIDVEN" in the last 168 hours.  No results found for this or any previous visit (from the past 240 hour(s)).       Radiology Studies: No results found.      Scheduled Meds:  ALPRAZolam  0.5 mg Oral QHS   aspirin EC  81 mg Oral Daily   cyanocobalamin  1,000 mcg Oral Daily   dapagliflozin propanediol  10 mg Oral Daily   digoxin  0.125 mg Oral QODAY   [START ON 03/12/2023] diltiazem  180 mg Oral Daily   enoxaparin (LOVENOX) injection  30 mg Subcutaneous Q24H   feeding supplement (GLUCERNA SHAKE)  237 mL Oral BID BM   fenofibrate  54 mg Oral Daily   gabapentin  200  mg Oral TID   hydrOXYzine  25 mg Oral BID   insulin aspart  0-15 Units Subcutaneous TID WC   insulin aspart  15 Units Subcutaneous TID WC   insulin glargine-yfgn  40 Units Subcutaneous QHS   metoprolol tartrate  50 mg Oral BID   multivitamin with minerals  1 tablet Oral Daily   pantoprazole  40 mg Oral Daily   prednisoLONE acetate  1 drop Both Eyes QHS   rOPINIRole  4 mg Oral QHS   rosuvastatin  10 mg Oral QHS   Continuous Infusions:   LOS: 14 days      Charise Killian, MD [facility] Pager [phone]  If 7PM-7AM, please contact night-coverage [email or web address]  03/11/2023, 11:04 AM
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
## 2023-03-11 NOTE — Evaluation (Signed)
Occupational Therapy Re-Evaluation Patient Details Name: Samantha Freeman MRN: [id number] DOB: 28-Nov-1935 Today's Date: 03/11/2023   History of Present Illness Samantha Freeman is a 87 y.o. female with medical history significant of recently diagnosed atrial fibrillation not on [facility], recently diagnosed CHF, type 2 diabetes, hypertension, hyperlipidemia, CKD stage IIIa, depression/anxiety, who presents to the ED on 02/25/23 due to atrial fibrillation.   Clinical Impression   Pt seen for OT treatment on this date. Upon arrival to room pt supine in bed, agreeable to tx. Pt educated on energy conservation strategies and AE, pt verbalized understanding and refused BSC. Pt completed all bed mobility with supervision and completed STS with CGA+RW. Pt ambulated about 152ft in the hallway and back to room with CGA+RW. Pt requested bathroom use. Pt completed toilet t/f, clothing management and hygiene with CGA +RW. Pt returned to chair. Pt left seated in chair with call bell within reach and chair alarm. Pt making excellent progress towards occupational therapy goals with functional mobility and ADL's. Discharge recommendation remains appropriate.        If plan is discharge home, recommend the following: A little help with walking and/or transfers;A little help with bathing/dressing/bathroom;Assistance with cooking/housework;Assist for transportation;Help with stairs or ramp for entrance    Functional Status Assessment     Equipment Recommendations  Other (comment)    Recommendations for Other Services       Precautions / Restrictions Precautions Precautions: Fall Restrictions Weight Bearing Restrictions: No      Mobility Bed Mobility Overal bed mobility: Needs Assistance Bed Mobility: Supine to Sit     Supine to sit: Supervision       Patient Response: Cooperative  Transfers Overall transfer level: Needs assistance Equipment used: Rolling walker (2 wheels) Transfers: Sit to/from  Stand Sit to Stand: Contact guard assist                  Balance Overall balance assessment: Needs assistance Sitting-balance support: Feet supported Sitting balance-Leahy Scale: Good     Standing balance support: During functional activity, Bilateral upper extremity supported Standing balance-Leahy Scale: Fair                             ADL either performed or assessed with clinical judgement   ADL Overall ADL's : Needs assistance/impaired                                     Functional mobility during ADLs: Rolling walker (2 wheels);Cueing for safety;Contact guard assist General ADL Comments: Pt educated on energy conservation strategies and AE, pt verbalized understanding and refused BSC. Pt ambulated about 146ft in the hallway and back to room with CGA+RW. Pt requested bathroom use. Pt completed toilet t/f, clothing management and hygiene with CGA +RW.     Vision Patient Visual Report: No change from baseline       Perception         Praxis         Pertinent Vitals/Pain Pain Assessment Pain Assessment: No/denies pain     Extremity/Trunk Assessment Upper Extremity Assessment Upper Extremity Assessment: Generalized weakness   Lower Extremity Assessment Lower Extremity Assessment: Generalized weakness   Cervical / Trunk Assessment Cervical / Trunk Assessment: Normal   Communication Communication Communication: No apparent difficulties Cueing Techniques: Verbal cues   Cognition Arousal: Alert Behavior During Therapy: WFL for tasks assessed/performed Overall  Cognitive Status: Within Functional Limits for tasks assessed                                 General Comments: Pleasant and motivated to work with OT     General Comments       Exercises     Shoulder Instructions      Home Living                                          Prior Functioning/Environment                           OT Problem List:        OT Treatment/Interventions:      OT Goals(Current goals can be found in the care plan section) Acute Rehab OT Goals Patient Stated Goal: to go to rehab OT Goal Formulation: With patient Time For Goal Achievement: 03/25/23 Potential to Achieve Goals: Good ADL Goals Pt Will Transfer to Toilet: with modified independence;ambulating;regular height toilet  OT Frequency: Min 1X/week    Co-evaluation              AM-PAC OT "6 Clicks" Daily Activity     Outcome Measure Help from another person eating meals?: None Help from another person taking care of personal grooming?: A Little Help from another person toileting, which includes using toliet, bedpan, or urinal?: A Little Help from another person bathing (including washing, rinsing, drying)?: A Lot Help from another person to put on and taking off regular upper body clothing?: A Little Help from another person to put on and taking off regular lower body clothing?: A Little 6 Click Score: 18   End of Session Equipment Utilized During Treatment: Rolling walker (2 wheels)  Activity Tolerance: Patient tolerated treatment well Patient left: in chair;with call bell/phone within reach;with chair alarm set  OT Visit Diagnosis: Other abnormalities of gait and mobility (R26.89);Unsteadiness on feet (R26.81);Muscle weakness (generalized) (M62.81)                Time: 6387-5643 OT Time Calculation (min): 30 min Charges:     Butch Penny, SOT

## 2023-03-11 NOTE — Plan of Care (Signed)
  Problem: Education: Goal: Ability to describe self-care measures that may prevent or decrease complications (Diabetes Survival Skills Education) will improve Outcome: Progressing Goal: Individualized Educational Video(s) Outcome: Progressing   

## 2023-03-11 NOTE — Progress Notes (Signed)
[facility] CLINIC CARDIOLOGY PROGRESS NOTE       Patient ID: Samantha Freeman MRN: [id number] DOB/AGE: 07-09-1935 87 y.o.  Admit date: 02/25/2023 Referring Physician Dr. Darlin Priestly Primary Physician Sparks, Duane Lope, MD  Primary Cardiologist Leanora Ivanoff, PA Reason for Consultation atrial fibrillation RVR  HPI: Samantha Freeman is a 87 y.o. female  with a past medical history of recently diagnosed atrial fibrillation, mild aortic stenosis, bradycardia, type 2 diabetes, hypercholesterolemia who presented to the ED on 02/25/2023 for generalized weakness and fatigue. Found to be in AF RVR in the ED. Cardiology was consulted for further evaluation.   Interval history: -Patient reports she feels well overall. Denies any CP or SOB.  -HR control improved, still with some slightly elevated rates this AM prior to receiving meds. Denies palpitations. -Pending discharge, SNF was denied and she is working to appeal this.  Review of systems complete and found to be negative unless listed above    Past Medical History:  Diagnosis Date   Anemia    Anxiety    Aortic stenosis, mild    Arthritis    CKD (chronic kidney disease), stage III (HCC)    Complication of anesthesia    Propofol anaphylaxis   Cortical cataract    DDD (degenerative disc disease), lumbar    Depression    Dyspnea    GERD (gastroesophageal reflux disease)    Headache    Heart murmur    History of 2019 novel coronavirus disease (COVID-19) 12/01/2018   HLD (hyperlipidemia)    Hypertension    Pneumonia    Schatzki's ring    Seasonal allergies    T2DM (type 2 diabetes mellitus) (HCC)    Valvular insufficiency     Past Surgical History:  Procedure Laterality Date   ABDOMINAL HYSTERECTOMY     APPENDECTOMY     BICEPT TENODESIS Right 05/13/2018   Procedure: BICEPS TENODESIS;  Surgeon: Christena Flake, MD;  Location: [facility] ORS;  Service: Orthopedics;  Laterality: Right;   CARDIAC CATHETERIZATION     COLONOSCOPY     EYE SURGERY Left  11/2013   Corneal transplant   EYE SURGERY Right 09/2013   Corneal transplant   JOINT REPLACEMENT Right 2015   knee   REVERSE SHOULDER ARTHROPLASTY Left 10/13/2020   Procedure: REVERSE SHOULDER ARTHROPLASTY;  Surgeon: Christena Flake, MD;  Location: [facility] ORS;  Service: Orthopedics;  Laterality: Left;   SHOULDER ARTHROSCOPY WITH ROTATOR CUFF REPAIR Right 05/13/2018   Procedure: SHOULDER ARTHROSCOPY WITH DEBRIDEMENT, DECOMPRESSION AND ROTATOR CUFF REPAIR;  Surgeon: Christena Flake, MD;  Location: [facility] ORS;  Service: Orthopedics;  Laterality: Right;   TONSILLECTOMY      Medications Prior to Admission  Medication Sig Dispense Refill Last Dose   aspirin 325 MG tablet Take 325 mg by mouth daily.   02/25/2023   Calcium Carb-Cholecalciferol (CALCIUM 600/VITAMIN D3 PO) Take 1 tablet by mouth daily.   02/25/2023   Coenzyme Q10 10 MG capsule Take 10 mg by mouth every morning.   02/25/2023   dapagliflozin propanediol (FARXIGA) 10 MG TABS tablet Take 10 mg by mouth daily.   02/25/2023   enalapril (VASOTEC) 20 MG tablet Take 20 mg by mouth 2 (two) times daily.   02/25/2023   fenofibrate (TRICOR) 48 MG tablet Take 1 tablet by mouth daily.   02/25/2023   furosemide (LASIX) 20 MG tablet Take 20 mg by mouth daily as needed for fluid or edema.   prn at unknown   gabapentin (NEURONTIN) 100 MG capsule  Take 200 mg by mouth 3 (three) times daily.   02/25/2023   hydrOXYzine (ATARAX) 25 MG tablet Take 1 tablet by mouth 2 (two) times daily.   02/25/2023   insulin lispro (HUMALOG) 100 UNIT/ML injection Inject 7-19 Units into the skin 3 (three) times daily before meals. Blood Glucose level: 140-199 - 7 units, 200-250 - 9 units, 251-299 - 13 units,  300-349 - 17 units,  350 or above 19 units.   02/25/2023   LANTUS SOLOSTAR 100 UNIT/ML Solostar Pen Inject 36 Units into the skin at bedtime.   02/24/2023   magnesium oxide (MAG-OX) 400 MG tablet Take 800 mg by mouth 3 (three) times daily.   02/25/2023   metoprolol tartrate (LOPRESSOR) 50  MG tablet Take 1 tablet by mouth 2 (two) times daily.   02/25/2023   Multiple Vitamin (MULTIVITAMIN) tablet Take 1 tablet by mouth daily. Women's Daily Multivitamin   02/25/2023   pantoprazole (PROTONIX) 40 MG tablet Take 1 tablet (40 mg total) by mouth daily.   02/25/2023   prednisoLONE acetate (PRED FORTE) 1 % ophthalmic suspension Place 1 drop into both eyes at bedtime.   02/24/2023   rosuvastatin (CRESTOR) 10 MG tablet Take 10 mg by mouth daily.   02/24/2023   traZODone (DESYREL) 50 MG tablet Take 50 mg by mouth at bedtime.   prn at unknown   vitamin B-12 (CYANOCOBALAMIN) 1000 MCG tablet Take 1,000 mcg by mouth daily.   02/24/2023   [DISCONTINUED] ALPRAZolam (XANAX) 0.5 MG tablet Take 0.5 mg by mouth at bedtime.   02/24/2023   blood glucose meter kit and supplies KIT Dispense based on patient and insurance preference. Use up to four times daily as directed. (FOR ICD-9 250.00, 250.01). For QAC - HS accuchecks. 1 each 1    diltiazem (CARDIZEM CD) 180 MG 24 hr capsule Take 1 capsule (180 mg total) by mouth daily.      ferrous sulfate 325 (65 FE) MG tablet Take 325 mg by mouth daily with breakfast. (Patient not taking: Reported on 02/25/2023)   Not Taking   glucose blood (FREESTYLE LITE) test strip For glucose testing every before meals at bedtime. Diagnosis E 11.65  Can substitute to any accepted brand 100 each 0    Insulin Syringe-Needle U-100 25G X 1" 1 ML MISC For 4 times a day insulin SQ, 1 month supply. Diagnosis E11.65 30 each 0    PARoxetine (PAXIL) 10 MG tablet Take 10 mg by mouth daily.      Social History   Socioeconomic History   Marital status: Widowed    Spouse name: Not on file   Number of children: Not on file   Years of education: Not on file   Highest education level: Not on file  Occupational History   Not on file  Tobacco Use   Smoking status: Never   Smokeless tobacco: Never  Vaping Use   Vaping status: Never Used  Substance and Sexual Activity   Alcohol use: Never    Drug use: Never   Sexual activity: Not Currently  Other Topics Concern   Not on file  Social History Narrative   Lives alone, has support from son who is a Optician, dispensing and daughter in Social worker. Will be with mother when she has surgery.   Social Determinants of Health   Financial Resource Strain: Not on file  Food Insecurity: No Food Insecurity (02/25/2023)   Hunger Vital Sign    Worried About Running Out of Food in the Last Year:  Never true    Ran Out of Food in the Last Year: Never true  Transportation Needs: No Transportation Needs (02/25/2023)   PRAPARE - Administrator, Civil Service (Medical): No    Lack of Transportation (Non-Medical): No  Physical Activity: Not on file  Stress: Not on file  Social Connections: Not on file  Intimate Partner Violence: Not At Risk (02/25/2023)   Humiliation, Afraid, Rape, and Kick questionnaire    Fear of Current or Ex-Partner: No    Emotionally Abused: No    Physically Abused: No    Sexually Abused: No    Family History  Problem Relation Age of Onset   Breast cancer Paternal Aunt      Vitals:   03/11/23 0148 03/11/23 0357 03/11/23 0800 03/11/23 0807  BP:  94/65  118/82  Pulse:  72  78  Resp:  19 19   Temp:  98 F (36.7 C)  97.7 F (36.5 C)  TempSrc:      SpO2:  92%  93%  Weight:      Height: 5\' 4"  (1.626 m)       PHYSICAL EXAM General: Well-appearing, well nourished, in no acute distress sitting upright in hospital bed. HEENT: Normocephalic and atraumatic. Neck: No JVD.  Lungs: Normal respiratory effort on room air. Clear bilaterally to auscultation. No wheezes, crackles, rhonchi.  Heart: Irregularly irregular. Normal S1 and S2 without gallops or murmurs.  Abdomen: Non-distended appearing.  Msk: Normal strength and tone for age. Extremities: Warm and well perfused. No clubbing, cyanosis.  No edema.  Neuro: Alert and oriented X 3. Psych: Answers questions appropriately.   Labs: Basic Metabolic Panel: Recent Labs     03/09/23 0409  NA 137  K 3.7  CL 100  CO2 24  GLUCOSE 147*  BUN 39*  CREATININE 1.12*  CALCIUM 9.4  MG 2.0   Liver Function Tests: No results for input(s): "AST", "ALT", "ALKPHOS", "BILITOT", "PROT", "ALBUMIN" in the last 72 hours. No results for input(s): "LIPASE", "AMYLASE" in the last 72 hours. CBC: No results for input(s): "WBC", "NEUTROABS", "HGB", "HCT", "MCV", "PLT" in the last 72 hours.  Cardiac Enzymes: No results for input(s): "CKTOTAL", "CKMB", "CKMBINDEX", "TROPONINIHS" in the last 72 hours.  BNP: No results for input(s): "BNP" in the last 72 hours. D-Dimer: No results for input(s): "DDIMER" in the last 72 hours. Hemoglobin A1C: No results for input(s): "HGBA1C" in the last 72 hours. Fasting Lipid Panel: No results for input(s): "CHOL", "HDL", "LDLCALC", "TRIG", "CHOLHDL", "LDLDIRECT" in the last 72 hours. Thyroid Function Tests: No results for input(s): "TSH", "T4TOTAL", "T3FREE", "THYROIDAB" in the last 72 hours.  Invalid input(s): "FREET3" Anemia Panel: No results for input(s): "VITAMINB12", "FOLATE", "FERRITIN", "TIBC", "IRON", "RETICCTPCT" in the last 72 hours.   Radiology: CT Angio Chest PE W/Cm &/Or Wo Cm  Result Date: 02/25/2023 CLINICAL DATA:  Pulmonary embolism (PE) suspected, high prob. Weakness. Shortness of breath. EXAM: CT ANGIOGRAPHY CHEST WITH CONTRAST TECHNIQUE: Multidetector CT imaging of the chest was performed using the standard protocol during bolus administration of intravenous contrast. Multiplanar CT image reconstructions and MIPs were obtained to evaluate the vascular anatomy. RADIATION DOSE REDUCTION: This exam was performed according to the departmental dose-optimization program which includes automated exposure control, adjustment of the mA and/or kV according to patient size and/or use of iterative reconstruction technique. CONTRAST:  75mL OMNIPAQUE IOHEXOL 350 MG/ML SOLN COMPARISON:  CT angiography chest from 01/30/2023. FINDINGS:  Cardiovascular: No evidence of embolism to the proximal subsegmental  pulmonary artery level. Mild cardiomegaly. No pericardial effusion. No aortic aneurysm. There are coronary artery calcifications, in keeping with coronary artery disease. There are also mild-to-moderate peripheral atherosclerotic vascular calcifications of thoracic aorta and its major branches. There is dilation of the main pulmonary trunk measuring up to 3.3 cm, which is nonspecific but can be seen with pulmonary artery hypertension. Mediastinum/Nodes: Visualized thyroid gland appears grossly unremarkable. No solid / cystic mediastinal masses. The esophagus is nondistended precluding optimal assessment. There are few mildly prominent mediastinal and hilar lymph nodes, which do not meet the size criteria for lymphadenopathy and appear grossly similar to the prior study, favoring benign etiology. No axillary lymphadenopathy by size criteria. Lungs/Pleura: The central tracheo-bronchial tree is patent. There is mild, smooth, circumferential thickening of the segmental and subsegmental bronchial walls, throughout bilateral lungs, which is nonspecific. Findings are most commonly seen with pulmonary edema, bronchitis or reactive airway disease, such as asthma. Redemonstration of filling defects in the bilateral lower lobe subsegmental bronchi, likely due to mucus/secretions or aspiration. There are patchy areas of smooth interlobular septal thickening throughout bilateral lungs and bilateral trace pleural effusions. There are patchy areas of linear, plate-like atelectasis and/or scarring throughout bilateral lungs. No mass or consolidation. No pneumothorax. No suspicious lung nodules. Upper Abdomen: Visualized upper abdominal viscera within normal limits. Musculoskeletal: The visualized soft tissues of the chest wall are grossly unremarkable. No suspicious osseous lesions. There are mild multilevel degenerative changes in the visualized spine. Review of  the MIP images confirms the above findings. IMPRESSION: *No evidence of pulmonary embolism to the proximal subsegmental pulmonary artery level. *There is mild smooth interlobular septal thickening throughout bilateral lungs with bilateral trace pleural effusions favoring congestive heart failure/pulmonary edema. *Multiple other nonacute observations, as described above. Electronically Signed   By: Jules Schick M.D.   On: 02/25/2023 14:34   DG Chest Port 1 View  Result Date: 02/25/2023 CLINICAL DATA:  Shortness of breath. EXAM: PORTABLE CHEST 1 VIEW COMPARISON:  Sep 15, 2019. FINDINGS: Mild cardiomegaly is noted. Mild central pulmonary vascular congestion is noted. Minimal bibasilar pulmonary edema is noted. Status post left shoulder arthroplasty. IMPRESSION: Mild cardiomegaly with mild central pulmonary vascular congestion and minimal bibasilar pulmonary edema. Electronically Signed   By: Lupita Raider M.D.   On: 02/25/2023 13:22    ECHO 01/2023: 1. Left ventricular ejection fraction, by estimation, is 60 to 65%. The left ventricle has normal function. The left ventricle has no regional wall motion abnormalities. There is moderate left ventricular hypertrophy. Indeterminate diastolic filling due   to E-A fusion.   2. Right ventricular systolic function is normal. The right ventricular size is normal.   3. Left atrial size was moderately dilated.   4. Right atrial size was mildly dilated.   5. The mitral valve is normal in structure. Mild mitral valve regurgitation. No evidence of mitral stenosis.   6. The aortic valve is normal in structure. Aortic valve regurgitation is  not visualized. Mild aortic valve stenosis.   7. The inferior vena cava is normal in size with greater than 50%  respiratory variability, suggesting right atrial pressure of 3 mmHg.   TELEMETRY reviewed by me 03/11/2023: atrial fibrillation rate 90-100s  EKG reviewed by me: AF RVR rate 116 bpm  Data reviewed by me  03/11/2023: last 24h vitals tele labs imaging I/O hospitalist progress note  Principal Problem:   Atrial fibrillation with RVR (HCC) Active Problems:   Chronic kidney disease, stage 3a (HCC)   DM (diabetes  mellitus) (HCC)   HTN (hypertension)   Acute hypoxic respiratory failure (HCC)   Anxiety and depression   Acute heart failure with preserved ejection fraction (HFpEF) (HCC)   Generalized weakness    ASSESSMENT AND PLAN:  Roxianne Southward is a 87 y.o. female  with a past medical history of recently diagnosed atrial fibrillation, mild aortic stenosis, bradycardia, type 2 diabetes, hypercholesterolemia who presented to the ED on 02/25/2023 for generalized weakness and fatigue. Found to be in AF RVR in the ED. Cardiology was consulted for further evaluation.   # Atrial fibrillation RVR # Recently diagnosed atrial fibrillation Patient presented with worsening weakness and fatigue. Found to be in AF RVR in the ED. Was diagnosed with AF during admission 10/9-10/14. Initially started on eliquis but this has been held due to epistaxis requiring packing. Rate has been difficult to control throughout admission.  -Increase diltiazem to 180 mg daily. Continue digoxin 0.125 mg every other day. Continue metoprolol 50 mg twice daily.  -Home enalapril and amlodipine discontinued due to borderline BP on rate control medications.  -Patient declines resuming anticoagulation given recent nosebleeds. Educated on risks associated with atrial fibrillation and stroke.  -Not a candidate at this time for cardioversion or antiarrhythmic medications due to risk of stroke given she refuses anticoagulation.  # Acute on chronic HFpEF (EF 60-65% in 01/2023) # Acute hypoxic respiratory failure Patient with SOB prior to admission. BNP 517. S/p IV diuresis. Poorly documented output but weight down nearly 10 kg since 11/4. SOB improved overall and appears euvolemic. -Continue farxiga.  -Continue home lasix prn on discharge.    # Hyperlipidemia -Continue rosuvastatin 10 mg daily and aspirin 81 mg daily.   This patient's plan of care was discussed and created with Dr. Darrold Junker and he is in agreement.  Signed: Gale Journey, PA-C  03/11/2023, 10:24 AM [facility]

## 2023-03-12 ENCOUNTER — Encounter: Payer: Medicare Other | Admitting: Dermatology

## 2023-03-12 LAB — GLUCOSE, CAPILLARY
Glucose-Capillary: 215 mg/dL — ABNORMAL HIGH (ref 70–99)
Glucose-Capillary: 205 mg/dL — ABNORMAL HIGH (ref 70–99)
Glucose-Capillary: 185 mg/dL — ABNORMAL HIGH (ref 70–99)

## 2023-03-12 LAB — BASIC METABOLIC PANEL
Anion gap: 11 (ref 5–15)
BUN: 40 mg/dL — ABNORMAL HIGH (ref 8–23)
CO2: 26 mmol/L (ref 22–32)
Calcium: 9.4 mg/dL (ref 8.9–10.3)
Chloride: 101 mmol/L (ref 98–111)
Creatinine, Ser: 1.48 mg/dL — ABNORMAL HIGH (ref 0.44–1.00)
GFR, Estimated: 34 mL/min — ABNORMAL LOW (ref >60)
Glucose, Bld: 189 mg/dL — ABNORMAL HIGH (ref 70–99)
Potassium: 4.2 mmol/L (ref 3.5–5.1)
Sodium: 138 mmol/L (ref 135–145)

## 2023-03-12 MED ORDER — METOPROLOL TARTRATE 50 MG PO TABS: 50.0000 mg | ORAL_TABLET | Freq: Two times a day (BID) | ORAL | 0 refills | Status: AC

## 2023-03-12 MED ORDER — DILTIAZEM HCL ER COATED BEADS 180 MG PO CP24: 180.0000 mg | ORAL_CAPSULE | Freq: Every day | ORAL | 0 refills | Status: DC

## 2023-03-12 MED ORDER — DIGOXIN 125 MCG PO TABS: 0.1250 mg | ORAL_TABLET | ORAL | 0 refills | Status: DC

## 2023-03-12 NOTE — TOC Progression Note (Signed)
Transition of Care [facility]) - Progression Note    Patient Details  Name: Samantha Freeman MRN: [id number] Date of Birth: 1935/12/07  [facility]) CM/SW Contact  Truddie Hidden, RN Phone Number: 03/12/2023, 1:03 PM  Clinical Narrative:    Spoke with patient at bedside. Patient stated she was approved for SNF.   12:15pm Spoke with Tiffany from [employer]. Patient admissions approved for today.   12:17 Spoke with Natalia Leatherwood from [facility]. Patient Berkley Harvey still states pending in the system. Katherine sent a message to the plan to update the determination in the portal and assign an auth number.     Expected Discharge Plan: Skilled Nursing Facility Barriers to Discharge: Continued Medical Work up  Expected Discharge Plan and Services     Post Acute Care Choice: Skilled Nursing Facility Living arrangements for the past 2 months: Apartment Expected Discharge Date: 03/06/23                                     Social Determinants of Health (SDOH) Interventions SDOH Screenings   Food Insecurity: No Food Insecurity (02/25/2023)  Housing: Low Risk  (02/25/2023)  Transportation Needs: No Transportation Needs (02/25/2023)  Utilities: Not At Risk (02/25/2023)  Tobacco Use: Low Risk  (02/25/2023)   Received from [facility]    Readmission Risk Interventions     No data to display

## 2023-03-12 NOTE — Care Management Important Message (Signed)
Important Message  Patient Details  Name: Vivyan Wythe MRN: [id number] Date of Birth: 1935-07-09   Important Message Given:  Yes - Medicare IM     Bernadette Hoit 03/12/2023, 10:59 AM

## 2023-03-12 NOTE — Progress Notes (Signed)
[facility] CLINIC CARDIOLOGY PROGRESS NOTE       Patient ID: Samantha Freeman MRN: [id number] DOB/AGE: June 27, 1935 87 y.o.  Admit date: 02/25/2023 Referring Physician Dr. Darlin Priestly Primary Physician Sparks, Duane Lope, MD  Primary Cardiologist Leanora Ivanoff, PA Reason for Consultation atrial fibrillation RVR  HPI: Samantha Freeman is a 87 y.o. female  with a past medical history of recently diagnosed atrial fibrillation, mild aortic stenosis, bradycardia, type 2 diabetes, hypercholesterolemia who presented to the ED on 02/25/2023 for generalized weakness and fatigue. Found to be in AF RVR in the ED. Cardiology was consulted for further evaluation.   Interval history: -Patient continues to feel well. Denies any chest pain or SOB. Has been OOB working with PT.  -HR control improved, still with some slightly elevated rates this AM prior to receiving meds. Denies palpitations. -Pending discharge, SNF was denied and she is working to appeal this. She is hoping to hear back about this today.  Review of systems complete and found to be negative unless listed above    Past Medical History:  Diagnosis Date   Anemia    Anxiety    Aortic stenosis, mild    Arthritis    CKD (chronic kidney disease), stage III (HCC)    Complication of anesthesia    Propofol anaphylaxis   Cortical cataract    DDD (degenerative disc disease), lumbar    Depression    Dyspnea    GERD (gastroesophageal reflux disease)    Headache    Heart murmur    History of 2019 novel coronavirus disease (COVID-19) 12/01/2018   HLD (hyperlipidemia)    Hypertension    Pneumonia    Schatzki's ring    Seasonal allergies    T2DM (type 2 diabetes mellitus) (HCC)    Valvular insufficiency     Past Surgical History:  Procedure Laterality Date   ABDOMINAL HYSTERECTOMY     APPENDECTOMY     BICEPT TENODESIS Right 05/13/2018   Procedure: BICEPS TENODESIS;  Surgeon: Christena Flake, MD;  Location: [facility] ORS;  Service: Orthopedics;   Laterality: Right;   CARDIAC CATHETERIZATION     COLONOSCOPY     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   EYE SURGERY Right 09/2013   Corneal transplant   JOINT REPLACEMENT Right 2015   knee   REVERSE SHOULDER ARTHROPLASTY Left 10/13/2020   Procedure: REVERSE SHOULDER ARTHROPLASTY;  Surgeon: Christena Flake, MD;  Location: [facility] ORS;  Service: Orthopedics;  Laterality: Left;   SHOULDER ARTHROSCOPY WITH ROTATOR CUFF REPAIR Right 05/13/2018   Procedure: SHOULDER ARTHROSCOPY WITH DEBRIDEMENT, DECOMPRESSION AND ROTATOR CUFF REPAIR;  Surgeon: Christena Flake, MD;  Location: [facility] ORS;  Service: Orthopedics;  Laterality: Right;   TONSILLECTOMY      Medications Prior to Admission  Medication Sig Dispense Refill Last Dose   aspirin 325 MG tablet Take 325 mg by mouth daily.   02/25/2023   Calcium Carb-Cholecalciferol (CALCIUM 600/VITAMIN D3 PO) Take 1 tablet by mouth daily.   02/25/2023   Coenzyme Q10 10 MG capsule Take 10 mg by mouth every morning.   02/25/2023   dapagliflozin propanediol (FARXIGA) 10 MG TABS tablet Take 10 mg by mouth daily.   02/25/2023   enalapril (VASOTEC) 20 MG tablet Take 20 mg by mouth 2 (two) times daily.   02/25/2023   fenofibrate (TRICOR) 48 MG tablet Take 1 tablet by mouth daily.   02/25/2023   furosemide (LASIX) 20 MG tablet Take 20 mg by mouth daily as needed for  fluid or edema.   prn at unknown   gabapentin (NEURONTIN) 100 MG capsule Take 200 mg by mouth 3 (three) times daily.   02/25/2023   hydrOXYzine (ATARAX) 25 MG tablet Take 1 tablet by mouth 2 (two) times daily.   02/25/2023   insulin lispro (HUMALOG) 100 UNIT/ML injection Inject 7-19 Units into the skin 3 (three) times daily before meals. Blood Glucose level: 140-199 - 7 units, 200-250 - 9 units, 251-299 - 13 units,  300-349 - 17 units,  350 or above 19 units.   02/25/2023   LANTUS SOLOSTAR 100 UNIT/ML Solostar Pen Inject 36 Units into the skin at bedtime.   02/24/2023   magnesium oxide (MAG-OX) 400 MG tablet Take 800  mg by mouth 3 (three) times daily.   02/25/2023   metoprolol tartrate (LOPRESSOR) 50 MG tablet Take 1 tablet by mouth 2 (two) times daily.   02/25/2023   Multiple Vitamin (MULTIVITAMIN) tablet Take 1 tablet by mouth daily. Women's Daily Multivitamin   02/25/2023   pantoprazole (PROTONIX) 40 MG tablet Take 1 tablet (40 mg total) by mouth daily.   02/25/2023   prednisoLONE acetate (PRED FORTE) 1 % ophthalmic suspension Place 1 drop into both eyes at bedtime.   02/24/2023   rosuvastatin (CRESTOR) 10 MG tablet Take 10 mg by mouth daily.   02/24/2023   traZODone (DESYREL) 50 MG tablet Take 50 mg by mouth at bedtime.   prn at unknown   vitamin B-12 (CYANOCOBALAMIN) 1000 MCG tablet Take 1,000 mcg by mouth daily.   02/24/2023   [DISCONTINUED] ALPRAZolam (XANAX) 0.5 MG tablet Take 0.5 mg by mouth at bedtime.   02/24/2023   blood glucose meter kit and supplies KIT Dispense based on patient and insurance preference. Use up to four times daily as directed. (FOR ICD-9 250.00, 250.01). For QAC - HS accuchecks. 1 each 1    diltiazem (CARDIZEM CD) 180 MG 24 hr capsule Take 1 capsule (180 mg total) by mouth daily.      ferrous sulfate 325 (65 FE) MG tablet Take 325 mg by mouth daily with breakfast. (Patient not taking: Reported on 02/25/2023)   Not Taking   glucose blood (FREESTYLE LITE) test strip For glucose testing every before meals at bedtime. Diagnosis E 11.65  Can substitute to any accepted brand 100 each 0    Insulin Syringe-Needle U-100 25G X 1" 1 ML MISC For 4 times a day insulin SQ, 1 month supply. Diagnosis E11.65 30 each 0    PARoxetine (PAXIL) 10 MG tablet Take 10 mg by mouth daily.      Social History   Socioeconomic History   Marital status: Widowed    Spouse name: Not on file   Number of children: Not on file   Years of education: Not on file   Highest education level: Not on file  Occupational History   Not on file  Tobacco Use   Smoking status: Never   Smokeless tobacco: Never  Vaping Use    Vaping status: Never Used  Substance and Sexual Activity   Alcohol use: Never   Drug use: Never   Sexual activity: Not Currently  Other Topics Concern   Not on file  Social History Narrative   Lives alone, has support from son who is a Optician, dispensing and daughter in Social worker. Will be with mother when she has surgery.   Social Determinants of Health   Financial Resource Strain: Not on file  Food Insecurity: No Food Insecurity (02/25/2023)   Hunger  Vital Sign    Worried About Programme researcher, broadcasting/film/video in the Last Year: Never true    Ran Out of Food in the Last Year: Never true  Transportation Needs: No Transportation Needs (02/25/2023)   PRAPARE - Administrator, Civil Service (Medical): No    Lack of Transportation (Non-Medical): No  Physical Activity: Not on file  Stress: Not on file  Social Connections: Not on file  Intimate Partner Violence: Not At Risk (02/25/2023)   Humiliation, Afraid, Rape, and Kick questionnaire    Fear of Current or Ex-Partner: No    Emotionally Abused: No    Physically Abused: No    Sexually Abused: No    Family History  Problem Relation Age of Onset   Breast cancer Paternal Aunt      Vitals:   03/11/23 2309 03/11/23 2325 03/12/23 0353 03/12/23 0755  BP: 102/85  120/69 120/86  Pulse:   75 77  Resp: (!) 22 (!) 22 (!) 22   Temp: 98.2 F (36.8 C)  97.7 F (36.5 C) 98.3 F (36.8 C)  TempSrc:      SpO2: 91%  92% 93%  Weight:      Height:        PHYSICAL EXAM General: Well-appearing, well nourished, in no acute distress sitting upright in hospital bed. HEENT: Normocephalic and atraumatic. Neck: No JVD.  Lungs: Normal respiratory effort on room air. Clear bilaterally to auscultation. No wheezes, crackles, rhonchi.  Heart: Irregularly irregular. Normal S1 and S2 without gallops or murmurs.  Abdomen: Non-distended appearing.  Msk: Normal strength and tone for age. Extremities: Warm and well perfused. No clubbing, cyanosis.  No edema.  Neuro: Alert  and oriented X 3. Psych: Answers questions appropriately.   Labs: Basic Metabolic Panel: Recent Labs    03/12/23 0424  NA 138  K 4.2  CL 101  CO2 26  GLUCOSE 189*  BUN 40*  CREATININE 1.48*  CALCIUM 9.4   Liver Function Tests: No results for input(s): "AST", "ALT", "ALKPHOS", "BILITOT", "PROT", "ALBUMIN" in the last 72 hours. No results for input(s): "LIPASE", "AMYLASE" in the last 72 hours. CBC: No results for input(s): "WBC", "NEUTROABS", "HGB", "HCT", "MCV", "PLT" in the last 72 hours.  Cardiac Enzymes: No results for input(s): "CKTOTAL", "CKMB", "CKMBINDEX", "TROPONINIHS" in the last 72 hours.  BNP: No results for input(s): "BNP" in the last 72 hours. D-Dimer: No results for input(s): "DDIMER" in the last 72 hours. Hemoglobin A1C: No results for input(s): "HGBA1C" in the last 72 hours. Fasting Lipid Panel: No results for input(s): "CHOL", "HDL", "LDLCALC", "TRIG", "CHOLHDL", "LDLDIRECT" in the last 72 hours. Thyroid Function Tests: No results for input(s): "TSH", "T4TOTAL", "T3FREE", "THYROIDAB" in the last 72 hours.  Invalid input(s): "FREET3" Anemia Panel: No results for input(s): "VITAMINB12", "FOLATE", "FERRITIN", "TIBC", "IRON", "RETICCTPCT" in the last 72 hours.   Radiology: CT Angio Chest PE W/Cm &/Or Wo Cm  Result Date: 02/25/2023 CLINICAL DATA:  Pulmonary embolism (PE) suspected, high prob. Weakness. Shortness of breath. EXAM: CT ANGIOGRAPHY CHEST WITH CONTRAST TECHNIQUE: Multidetector CT imaging of the chest was performed using the standard protocol during bolus administration of intravenous contrast. Multiplanar CT image reconstructions and MIPs were obtained to evaluate the vascular anatomy. RADIATION DOSE REDUCTION: This exam was performed according to the departmental dose-optimization program which includes automated exposure control, adjustment of the mA and/or kV according to patient size and/or use of iterative reconstruction technique. CONTRAST:  75mL  OMNIPAQUE IOHEXOL 350 MG/ML SOLN COMPARISON:  CT angiography chest from 01/30/2023. FINDINGS: Cardiovascular: No evidence of embolism to the proximal subsegmental pulmonary artery level. Mild cardiomegaly. No pericardial effusion. No aortic aneurysm. There are coronary artery calcifications, in keeping with coronary artery disease. There are also mild-to-moderate peripheral atherosclerotic vascular calcifications of thoracic aorta and its major branches. There is dilation of the main pulmonary trunk measuring up to 3.3 cm, which is nonspecific but can be seen with pulmonary artery hypertension. Mediastinum/Nodes: Visualized thyroid gland appears grossly unremarkable. No solid / cystic mediastinal masses. The esophagus is nondistended precluding optimal assessment. There are few mildly prominent mediastinal and hilar lymph nodes, which do not meet the size criteria for lymphadenopathy and appear grossly similar to the prior study, favoring benign etiology. No axillary lymphadenopathy by size criteria. Lungs/Pleura: The central tracheo-bronchial tree is patent. There is mild, smooth, circumferential thickening of the segmental and subsegmental bronchial walls, throughout bilateral lungs, which is nonspecific. Findings are most commonly seen with pulmonary edema, bronchitis or reactive airway disease, such as asthma. Redemonstration of filling defects in the bilateral lower lobe subsegmental bronchi, likely due to mucus/secretions or aspiration. There are patchy areas of smooth interlobular septal thickening throughout bilateral lungs and bilateral trace pleural effusions. There are patchy areas of linear, plate-like atelectasis and/or scarring throughout bilateral lungs. No mass or consolidation. No pneumothorax. No suspicious lung nodules. Upper Abdomen: Visualized upper abdominal viscera within normal limits. Musculoskeletal: The visualized soft tissues of the chest wall are grossly unremarkable. No suspicious  osseous lesions. There are mild multilevel degenerative changes in the visualized spine. Review of the MIP images confirms the above findings. IMPRESSION: *No evidence of pulmonary embolism to the proximal subsegmental pulmonary artery level. *There is mild smooth interlobular septal thickening throughout bilateral lungs with bilateral trace pleural effusions favoring congestive heart failure/pulmonary edema. *Multiple other nonacute observations, as described above. Electronically Signed   By: Jules Schick M.D.   On: 02/25/2023 14:34   DG Chest Port 1 View  Result Date: 02/25/2023 CLINICAL DATA:  Shortness of breath. EXAM: PORTABLE CHEST 1 VIEW COMPARISON:  Sep 15, 2019. FINDINGS: Mild cardiomegaly is noted. Mild central pulmonary vascular congestion is noted. Minimal bibasilar pulmonary edema is noted. Status post left shoulder arthroplasty. IMPRESSION: Mild cardiomegaly with mild central pulmonary vascular congestion and minimal bibasilar pulmonary edema. Electronically Signed   By: Lupita Raider M.D.   On: 02/25/2023 13:22    ECHO 01/2023: 1. Left ventricular ejection fraction, by estimation, is 60 to 65%. The left ventricle has normal function. The left ventricle has no regional wall motion abnormalities. There is moderate left ventricular hypertrophy. Indeterminate diastolic filling due   to E-A fusion.   2. Right ventricular systolic function is normal. The right ventricular size is normal.   3. Left atrial size was moderately dilated.   4. Right atrial size was mildly dilated.   5. The mitral valve is normal in structure. Mild mitral valve regurgitation. No evidence of mitral stenosis.   6. The aortic valve is normal in structure. Aortic valve regurgitation is  not visualized. Mild aortic valve stenosis.   7. The inferior vena cava is normal in size with greater than 50%  respiratory variability, suggesting right atrial pressure of 3 mmHg.   TELEMETRY reviewed by me 03/12/2023: atrial  fibrillation rate 80-90s  EKG reviewed by me: AF RVR rate 116 bpm  Data reviewed by me 03/12/2023: last 24h vitals tele labs imaging I/O hospitalist progress note  Principal Problem:   Atrial fibrillation with RVR (  HCC) Active Problems:   Chronic kidney disease, stage 3a (HCC)   DM (diabetes mellitus) (HCC)   HTN (hypertension)   Acute hypoxic respiratory failure (HCC)   Anxiety and depression   Acute heart failure with preserved ejection fraction (HFpEF) (HCC)   Generalized weakness    ASSESSMENT AND PLAN:  Samantha Freeman is a 87 y.o. female  with a past medical history of recently diagnosed atrial fibrillation, mild aortic stenosis, bradycardia, type 2 diabetes, hypercholesterolemia who presented to the ED on 02/25/2023 for generalized weakness and fatigue. Found to be in AF RVR in the ED. Cardiology was consulted for further evaluation.   # Atrial fibrillation RVR # Recently diagnosed atrial fibrillation Patient presented with worsening weakness and fatigue. Found to be in AF RVR in the ED. Was diagnosed with AF during admission 10/9-10/14. Initially started on eliquis but this has been held due to epistaxis requiring packing. Rate has been difficult to control throughout admission.  -Continue diltiazem 180 mg daily, digoxin 0.125 mg every other day, and metoprolol 50 mg twice daily.  -Home enalapril and amlodipine discontinued due to borderline BP on rate control medications.  -Patient declines resuming anticoagulation given recent nosebleeds. Educated on risks associated with atrial fibrillation and stroke.  -Not a candidate at this time for cardioversion or antiarrhythmic medications due to risk of stroke given she refuses anticoagulation.  # Acute on chronic HFpEF (EF 60-65% in 01/2023) # Acute hypoxic respiratory failure Patient with SOB prior to admission. BNP 517. S/p IV diuresis. Poorly documented output but weight down nearly 10 kg since 11/4. SOB improved overall and  appears euvolemic. -Continue farxiga.  -Continue home lasix prn on discharge.   # Hyperlipidemia -Continue rosuvastatin 10 mg daily and aspirin 81 mg daily.   Patient is stable for discharge home from cardiac perspective when placement is determined. No further recommendations, cardiology will sign off at this time. Please haiku with any further questions.   This patient's plan of care was discussed and created with Dr. Darrold Junker and he is in agreement.  Signed: Gale Journey, PA-C  03/12/2023, 10:43 AM [facility]

## 2023-03-12 NOTE — Plan of Care (Signed)
  Problem: Coping: Goal: Ability to adjust to condition or change in health will improve Outcome: Progressing   Problem: Fluid Volume: Goal: Ability to maintain a balanced intake and output will improve Outcome: Progressing   Problem: Health Behavior/Discharge Planning: Goal: Ability to identify and utilize available resources and services will improve Outcome: Progressing Goal: Ability to manage health-related needs will improve Outcome: Progressing   Problem: Skin Integrity: Goal: Risk for impaired skin integrity will decrease Outcome: Progressing   Problem: Elimination: Goal: Will not experience complications related to urinary retention Outcome: Progressing   Problem: Pain Management: Goal: General experience of comfort will improve Outcome: Progressing   Problem: Metabolic: Goal: Ability to maintain appropriate glucose levels will improve Outcome: Not Progressing   Problem: Nutritional: Goal: Progress toward achieving an optimal weight will improve Outcome: Not Progressing   Problem: Coping: Goal: Level of anxiety will decrease Outcome: Not Progressing

## 2023-03-12 NOTE — Progress Notes (Signed)
Nutrition Follow-up  DOCUMENTATION CODES:   Obesity unspecified  INTERVENTION:   -D/c Glucerna Shake po BID, each supplement provides 220 kcal and 10 grams of protein  -Continue MVI with minerals daily -RD will sign off due to medical stability; if further nutrition-related concerns arise, please re-consult RD  NUTRITION DIAGNOSIS:   Inadequate oral intake related to poor appetite as evidenced by per patient/family report, meal completion < 50%.  Ongoing  GOAL:   Patient will meet greater than or equal to 90% of their needs  Progressing   MONITOR:   PO intake, Supplement acceptance  REASON FOR ASSESSMENT:   Consult Assessment of nutrition requirement/status, Poor PO  ASSESSMENT:   Pt with medical history significant of recently diagnosed atrial fibrillation not on AC, recently diagnosed CHF, type 2 diabetes, hypertension, hyperlipidemia, CKD stage IIIa, depression/anxiety, who presents due to atrial fibrillation.  Reviewed I/O's: +720 ml x 24 hours and +5.8 L since 02/26/23   Pt sitting up in bed at time of visit, eating brakfast. Pt offers no complaints.   Pt remains with good appetite. Noted meal completions 100%. Pt is drinking Glcuerna supplements. RD will d/c due to improve oral intake and to help improve glycemic control.   No new wt since last visit.   Medications reviewed and include vitamin B-12 and cardizem.   Per TOC notes, pt awaiting on appeal for SNF.   Labs reviewed: CBGS: 180-315 (inpatient orders for glycemic control are 0-15 units insulin aspart TID with meals, 18 units insulin aspart TID with meals, and 45 units insulin glargine-yfgn daily at bedtime).    Diet Order:   Diet Order             Diet - low sodium heart healthy           Diet Carb Modified           Diet 2 gram sodium Fluid consistency: Thin  Diet effective now                   EDUCATION NEEDS:   Education needs have been addressed  Skin:  Skin Assessment: Reviewed  RN Assessment  Last BM:  03/10/23 (type 2)  Height:   Ht Readings from Last 1 Encounters:  03/11/23 5\' 4"  (1.626 m)    Weight:   Wt Readings from Last 1 Encounters:  03/03/23 79 kg    Ideal Body Weight:  54.5 kg  BMI:  Body mass index is 29.9 kg/m.  Estimated Nutritional Needs:   Kcal:  1650-1850  Protein:  80-95 grams  Fluid:  > 1.6 L    Levada Schilling, RD, LDN, CDCES Registered Dietitian III Certified Diabetes Care and Education Specialist Please refer to [facility] for RD and/or RD on-call/weekend/after hours pager

## 2023-03-12 NOTE — TOC Transition Note (Signed)
Transition of Care [facility]) - CM/SW Discharge Note   Patient Details  Name: Samantha Freeman MRN: [id number] Date of Birth: May 07, 1935  [facility] CM/SW Contact:  Truddie Hidden, RN Phone Number: 03/12/2023, 1:14 PM   Clinical Narrative:    Spoke with Tiffany in admissions at [facility]  Per facility patient admission confirmed for today. Patient assigned room # 505 Nurse will call report to [phone] Face sheet and medical necessity forms printed to the floor to be added to the EMS pack Discharge summary and SNF transfer report sent in HUB.  Nurse, and family notified spoke with    2:36pm RNCM spoke with Tiffany from [employer]. Plan auth number [id number]. Tiffany advised EMS would be arranged since auth had been received.   2:39pm EMS arranged "She's 7th on the list."   TOC signing off      Final next level of care: Skilled Nursing Facility Barriers to Discharge: Barriers Resolved   Patient Goals and CMS Choice      Discharge Placement                  Patient to be transferred to facility by: [facility]   Patient and family notified of of transfer: 03/12/23  Discharge Plan and Services Additional resources added to the After Visit Summary for       Post Acute Care Choice: Skilled Nursing Facility                               Social Determinants of Health (SDOH) Interventions SDOH Screenings   Food Insecurity: No Food Insecurity (02/25/2023)  Housing: Low Risk  (02/25/2023)  Transportation Needs: No Transportation Needs (02/25/2023)  Utilities: Not At Risk (02/25/2023)  Tobacco Use: Low Risk  (02/25/2023)   Received from [facility]     Readmission Risk Interventions     No data to display

## 2023-03-28 ENCOUNTER — Other Ambulatory Visit: Payer: Self-pay

## 2023-03-28 ENCOUNTER — Inpatient Hospital Stay
Admission: EM | Admit: 2023-03-28 | Discharge: 2023-04-08 | DRG: 981 | Disposition: A | Discharge status: Home Health | Payer: Medicare Other | Attending: Internal Medicine | Admitting: Internal Medicine

## 2023-03-28 ENCOUNTER — Emergency Department: Payer: Medicare Other

## 2023-03-28 DIAGNOSIS — N179 Acute kidney failure, unspecified: Secondary | ICD-10-CM | POA: Diagnosis present

## 2023-03-28 DIAGNOSIS — I4891 Unspecified atrial fibrillation: Secondary | ICD-10-CM | POA: Diagnosis present

## 2023-03-28 DIAGNOSIS — R079 Chest pain, unspecified: Secondary | ICD-10-CM | POA: Diagnosis not present

## 2023-03-28 DIAGNOSIS — I5031 Acute diastolic (congestive) heart failure: Secondary | ICD-10-CM | POA: Diagnosis present

## 2023-03-28 DIAGNOSIS — I5033 Acute on chronic diastolic (congestive) heart failure: Secondary | ICD-10-CM | POA: Diagnosis present

## 2023-03-28 DIAGNOSIS — G4730 Sleep apnea, unspecified: Secondary | ICD-10-CM | POA: Diagnosis present

## 2023-03-28 DIAGNOSIS — G2581 Restless legs syndrome: Secondary | ICD-10-CM | POA: Diagnosis present

## 2023-03-28 DIAGNOSIS — Z8616 Personal history of COVID-19: Secondary | ICD-10-CM | POA: Diagnosis not present

## 2023-03-28 DIAGNOSIS — Z66 Do not resuscitate: Secondary | ICD-10-CM | POA: Diagnosis present

## 2023-03-28 DIAGNOSIS — R040 Epistaxis: Secondary | ICD-10-CM | POA: Diagnosis present

## 2023-03-28 DIAGNOSIS — G4734 Idiopathic sleep related nonobstructive alveolar hypoventilation: Secondary | ICD-10-CM | POA: Insufficient documentation

## 2023-03-28 DIAGNOSIS — Z947 Corneal transplant status: Secondary | ICD-10-CM

## 2023-03-28 DIAGNOSIS — J9601 Acute respiratory failure with hypoxia: Principal | ICD-10-CM | POA: Diagnosis present

## 2023-03-28 DIAGNOSIS — G9341 Metabolic encephalopathy: Secondary | ICD-10-CM

## 2023-03-28 DIAGNOSIS — E1165 Type 2 diabetes mellitus with hyperglycemia: Secondary | ICD-10-CM | POA: Diagnosis present

## 2023-03-28 DIAGNOSIS — R443 Hallucinations, unspecified: Secondary | ICD-10-CM | POA: Diagnosis present

## 2023-03-28 DIAGNOSIS — I2699 Other pulmonary embolism without acute cor pulmonale: Secondary | ICD-10-CM | POA: Diagnosis present

## 2023-03-28 DIAGNOSIS — N189 Chronic kidney disease, unspecified: Secondary | ICD-10-CM | POA: Diagnosis not present

## 2023-03-28 DIAGNOSIS — K219 Gastro-esophageal reflux disease without esophagitis: Secondary | ICD-10-CM | POA: Diagnosis present

## 2023-03-28 DIAGNOSIS — I130 Hypertensive heart and chronic kidney disease with heart failure and stage 1 through stage 4 chronic kidney disease, or unspecified chronic kidney disease: Secondary | ICD-10-CM | POA: Diagnosis present

## 2023-03-28 DIAGNOSIS — G9340 Encephalopathy, unspecified: Secondary | ICD-10-CM | POA: Insufficient documentation

## 2023-03-28 DIAGNOSIS — E1122 Type 2 diabetes mellitus with diabetic chronic kidney disease: Secondary | ICD-10-CM | POA: Diagnosis present

## 2023-03-28 DIAGNOSIS — R0789 Other chest pain: Secondary | ICD-10-CM | POA: Diagnosis present

## 2023-03-28 DIAGNOSIS — Z803 Family history of malignant neoplasm of breast: Secondary | ICD-10-CM

## 2023-03-28 DIAGNOSIS — I482 Chronic atrial fibrillation, unspecified: Secondary | ICD-10-CM | POA: Diagnosis not present

## 2023-03-28 DIAGNOSIS — F32A Depression, unspecified: Secondary | ICD-10-CM | POA: Diagnosis present

## 2023-03-28 DIAGNOSIS — I4819 Other persistent atrial fibrillation: Secondary | ICD-10-CM | POA: Diagnosis present

## 2023-03-28 DIAGNOSIS — I10 Essential (primary) hypertension: Secondary | ICD-10-CM | POA: Diagnosis not present

## 2023-03-28 DIAGNOSIS — Z794 Long term (current) use of insulin: Secondary | ICD-10-CM | POA: Diagnosis not present

## 2023-03-28 DIAGNOSIS — E7800 Pure hypercholesterolemia, unspecified: Secondary | ICD-10-CM | POA: Diagnosis present

## 2023-03-28 DIAGNOSIS — N1831 Chronic kidney disease, stage 3a: Secondary | ICD-10-CM | POA: Diagnosis present

## 2023-03-28 DIAGNOSIS — Z888 Allergy status to other drugs, medicaments and biological substances status: Secondary | ICD-10-CM

## 2023-03-28 DIAGNOSIS — Z885 Allergy status to narcotic agent status: Secondary | ICD-10-CM

## 2023-03-28 DIAGNOSIS — Z79899 Other long term (current) drug therapy: Secondary | ICD-10-CM | POA: Diagnosis not present

## 2023-03-28 DIAGNOSIS — E1169 Type 2 diabetes mellitus with other specified complication: Secondary | ICD-10-CM | POA: Diagnosis not present

## 2023-03-28 DIAGNOSIS — Z7901 Long term (current) use of anticoagulants: Secondary | ICD-10-CM

## 2023-03-28 DIAGNOSIS — R0902 Hypoxemia: Secondary | ICD-10-CM | POA: Insufficient documentation

## 2023-03-28 DIAGNOSIS — Z96612 Presence of left artificial shoulder joint: Secondary | ICD-10-CM | POA: Diagnosis present

## 2023-03-28 DIAGNOSIS — Z884 Allergy status to anesthetic agent status: Secondary | ICD-10-CM

## 2023-03-28 DIAGNOSIS — Z90710 Acquired absence of both cervix and uterus: Secondary | ICD-10-CM

## 2023-03-28 DIAGNOSIS — F419 Anxiety disorder, unspecified: Secondary | ICD-10-CM | POA: Diagnosis present

## 2023-03-28 DIAGNOSIS — Z7982 Long term (current) use of aspirin: Secondary | ICD-10-CM

## 2023-03-28 DIAGNOSIS — E669 Obesity, unspecified: Secondary | ICD-10-CM | POA: Diagnosis present

## 2023-03-28 DIAGNOSIS — E119 Type 2 diabetes mellitus without complications: Secondary | ICD-10-CM

## 2023-03-28 LAB — CBC WITH DIFFERENTIAL/PLATELET
Abs Immature Granulocytes: 0.05 10*3/uL (ref 0.00–0.07)
Basophils Absolute: 0.1 10*3/uL (ref 0.0–0.1)
Basophils Relative: 0 %
Eosinophils Absolute: 0.1 10*3/uL (ref 0.0–0.5)
Eosinophils Relative: 1 %
HCT: 44.9 % (ref 36.0–46.0)
Hemoglobin: 14.6 g/dL (ref 12.0–15.0)
Immature Granulocytes: 0 %
Lymphocytes Relative: 16 %
Lymphs Abs: 1.8 10*3/uL (ref 0.7–4.0)
MCH: 29.7 pg (ref 26.0–34.0)
MCHC: 32.5 g/dL (ref 30.0–36.0)
MCV: 91.4 fL (ref 80.0–100.0)
Monocytes Absolute: 1.1 10*3/uL — ABNORMAL HIGH (ref 0.1–1.0)
Monocytes Relative: 10 %
Neutro Abs: 8.2 10*3/uL — ABNORMAL HIGH (ref 1.7–7.7)
Neutrophils Relative %: 73 %
Platelets: 210 10*3/uL (ref 150–400)
RBC: 4.91 MIL/uL (ref 3.87–5.11)
RDW: 15.9 % — ABNORMAL HIGH (ref 11.5–15.5)
WBC: 11.3 10*3/uL — ABNORMAL HIGH (ref 4.0–10.5)
nRBC: 0 % (ref 0.0–0.2)

## 2023-03-28 LAB — SARS CORONAVIRUS 2 BY RT PCR: SARS Coronavirus 2 by RT PCR: NEGATIVE

## 2023-03-28 LAB — BLOOD GAS, VENOUS
Acid-Base Excess: 6.6 mmol/L — ABNORMAL HIGH (ref 0.0–2.0)
Bicarbonate: 32.4 mmol/L — ABNORMAL HIGH (ref 20.0–28.0)
O2 Saturation: 75.2 %
Patient temperature: 37
pCO2, Ven: 50 mm[Hg] (ref 44–60)
pH, Ven: 7.42 (ref 7.25–7.43)
pO2, Ven: 44 mm[Hg] (ref 32–45)

## 2023-03-28 LAB — BASIC METABOLIC PANEL
Anion gap: 12 (ref 5–15)
BUN: 29 mg/dL — ABNORMAL HIGH (ref 8–23)
CO2: 30 mmol/L (ref 22–32)
Calcium: 9.2 mg/dL (ref 8.9–10.3)
Chloride: 95 mmol/L — ABNORMAL LOW (ref 98–111)
Creatinine, Ser: 1.29 mg/dL — ABNORMAL HIGH (ref 0.44–1.00)
GFR, Estimated: 41 mL/min — ABNORMAL LOW (ref >60)
Glucose, Bld: 166 mg/dL — ABNORMAL HIGH (ref 70–99)
Potassium: 4.3 mmol/L (ref 3.5–5.1)
Sodium: 137 mmol/L (ref 135–145)

## 2023-03-28 LAB — APTT: aPTT: 32 s (ref 24–36)

## 2023-03-28 LAB — PROTIME-INR
INR: 1.2 (ref 0.8–1.2)
Prothrombin Time: 15.3 s — ABNORMAL HIGH (ref 11.4–15.2)

## 2023-03-28 LAB — CBG MONITORING, ED: Glucose-Capillary: 146 mg/dL — ABNORMAL HIGH (ref 70–99)

## 2023-03-28 LAB — LACTIC ACID, PLASMA: Lactic Acid, Venous: 1.2 mmol/L (ref 0.5–1.9)

## 2023-03-28 LAB — MRSA NEXT GEN BY PCR, NASAL: MRSA by PCR Next Gen: NOT DETECTED

## 2023-03-28 LAB — TROPONIN I (HIGH SENSITIVITY)
Troponin I (High Sensitivity): 9 ng/L (ref <18)
Troponin I (High Sensitivity): 9 ng/L (ref <18)

## 2023-03-28 LAB — BRAIN NATRIURETIC PEPTIDE: B Natriuretic Peptide: 661.2 pg/mL — ABNORMAL HIGH (ref 0.0–100.0)

## 2023-03-28 LAB — MAGNESIUM: Magnesium: 1.8 mg/dL (ref 1.7–2.4)

## 2023-03-28 LAB — PROCALCITONIN: Procalcitonin: <0.1 ng/mL

## 2023-03-28 LAB — HEPARIN LEVEL (UNFRACTIONATED): Heparin Unfractionated: <0.1 [IU]/mL — ABNORMAL LOW (ref 0.30–0.70)

## 2023-03-28 MED ORDER — HEPARIN BOLUS VIA INFUSION
4500.0000 [IU] | Freq: Once | INTRAVENOUS | Status: AC
Start: 2023-03-28 — End: 2023-03-28
  Administered 2023-03-28: 4500 [IU] via INTRAVENOUS
  Filled 2023-03-28: qty 4500

## 2023-03-28 MED ORDER — IOHEXOL 350 MG/ML SOLN
75.0000 mL | Freq: Once | INTRAVENOUS | Status: AC | PRN
  Administered 2023-03-28: 75 mL via INTRAVENOUS

## 2023-03-28 MED ORDER — ROSUVASTATIN CALCIUM 10 MG PO TABS
10.0000 mg | ORAL_TABLET | Freq: Every day | ORAL | Status: DC
  Administered 2023-03-28 – 2023-04-08 (×12): 10 mg via ORAL
  Filled 2023-03-28 (×12): qty 1

## 2023-03-28 MED ORDER — ENOXAPARIN SODIUM 60 MG/0.6ML IJ SOSY
0.5000 mg/kg | PREFILLED_SYRINGE | INTRAMUSCULAR | Status: DC
Start: 2023-03-28 — End: 2023-03-28

## 2023-03-28 MED ORDER — PANTOPRAZOLE SODIUM 40 MG PO TBEC
40.0000 mg | DELAYED_RELEASE_TABLET | Freq: Every day | ORAL | Status: DC
  Administered 2023-03-28 – 2023-04-08 (×12): 40 mg via ORAL
  Filled 2023-03-28 (×12): qty 1

## 2023-03-28 MED ORDER — ONDANSETRON HCL 4 MG/2ML IJ SOLN: 4.0000 mg | Freq: Four times a day (QID) | INTRAMUSCULAR | Status: DC | PRN

## 2023-03-28 MED ORDER — OLANZAPINE 10 MG IM SOLR: 2.5000 mg | Freq: Four times a day (QID) | INTRAMUSCULAR | Status: DC | PRN

## 2023-03-28 MED ORDER — INSULIN GLARGINE-YFGN 100 UNIT/ML ~~LOC~~ SOLN
10.0000 [IU] | Freq: Every day | SUBCUTANEOUS | Status: DC
  Filled 2023-03-28 (×2): qty 0.1

## 2023-03-28 MED ORDER — FUROSEMIDE 20 MG PO TABS
20.0000 mg | ORAL_TABLET | Freq: Every day | ORAL | Status: DC
  Administered 2023-03-28: 20 mg via ORAL
  Filled 2023-03-28: qty 1

## 2023-03-28 MED ORDER — CHLORHEXIDINE GLUCONATE CLOTH 2 % EX PADS
6.0000 | MEDICATED_PAD | Freq: Every day | CUTANEOUS | Status: DC
  Administered 2023-03-28 – 2023-03-31 (×4): 6 via TOPICAL
  Filled 2023-03-28: qty 6

## 2023-03-28 MED ORDER — ASPIRIN 81 MG PO TBEC
81.0000 mg | DELAYED_RELEASE_TABLET | Freq: Every day | ORAL | Status: DC
  Administered 2023-03-28 – 2023-04-08 (×12): 81 mg via ORAL
  Filled 2023-03-28 (×13): qty 1

## 2023-03-28 MED ORDER — DIGOXIN 125 MCG PO TABS: 0.1250 mg | ORAL_TABLET | ORAL | Status: DC

## 2023-03-28 MED ORDER — DILTIAZEM HCL-DEXTROSE 125-5 MG/125ML-% IV SOLN (PREMIX)
5.0000 mg/h | INTRAVENOUS | Status: DC
Start: 2023-03-28 — End: 2023-04-01
  Administered 2023-03-28: 15 mg/h via INTRAVENOUS
  Administered 2023-03-28: 5 mg/h via INTRAVENOUS
  Administered 2023-03-29: 15 mg/h via INTRAVENOUS
  Administered 2023-03-29: 10 mg/h via INTRAVENOUS
  Administered 2023-03-30 – 2023-03-31 (×5): 15 mg/h via INTRAVENOUS
  Administered 2023-04-01: 5 mg/h via INTRAVENOUS
  Filled 2023-03-28 (×10): qty 125

## 2023-03-28 MED ORDER — DILTIAZEM LOAD VIA INFUSION
10.0000 mg | Freq: Once | INTRAVENOUS | Status: AC
  Administered 2023-03-28: 10 mg via INTRAVENOUS
  Filled 2023-03-28: qty 10

## 2023-03-28 MED ORDER — ONDANSETRON HCL 4 MG PO TABS: 4.0000 mg | ORAL_TABLET | Freq: Four times a day (QID) | ORAL | Status: DC | PRN

## 2023-03-28 MED ORDER — OXYMETAZOLINE HCL 0.05 % NA SOLN
2.0000 | Freq: Two times a day (BID) | NASAL | Status: AC | PRN
  Administered 2023-03-28 – 2023-03-31 (×2): 2 via NASAL
  Filled 2023-03-28: qty 15

## 2023-03-28 MED ORDER — ACETAMINOPHEN 325 MG PO TABS
650.0000 mg | ORAL_TABLET | Freq: Four times a day (QID) | ORAL | Status: DC | PRN
  Administered 2023-03-28 – 2023-04-01 (×5): 650 mg via ORAL
  Filled 2023-03-28 (×5): qty 2

## 2023-03-28 MED ORDER — FUROSEMIDE 10 MG/ML IJ SOLN
40.0000 mg | Freq: Once | INTRAMUSCULAR | Status: AC
Start: 2023-03-28 — End: 2023-03-28
  Administered 2023-03-28: 40 mg via INTRAVENOUS
  Filled 2023-03-28: qty 4

## 2023-03-28 MED ORDER — METOPROLOL TARTRATE 50 MG PO TABS
50.0000 mg | ORAL_TABLET | Freq: Two times a day (BID) | ORAL | Status: DC
  Administered 2023-03-28 – 2023-04-08 (×22): 50 mg via ORAL
  Filled 2023-03-28 (×23): qty 1

## 2023-03-28 MED ORDER — HEPARIN (PORCINE) 25000 UT/250ML-% IV SOLN
1150.0000 [IU]/h | INTRAVENOUS | Status: DC
  Administered 2023-03-28: 1250 [IU]/h via INTRAVENOUS
  Administered 2023-03-30 – 2023-04-04 (×6): 1150 [IU]/h via INTRAVENOUS
  Filled 2023-03-28 (×10): qty 250

## 2023-03-28 MED ORDER — SODIUM CHLORIDE 0.9 % IV BOLUS
500.0000 mL | Freq: Once | INTRAVENOUS | Status: AC
  Administered 2023-03-28: 500 mL via INTRAVENOUS

## 2023-03-28 MED ORDER — ALPRAZOLAM 0.5 MG PO TABS
0.5000 mg | ORAL_TABLET | Freq: Every evening | ORAL | Status: DC | PRN
  Administered 2023-03-28 – 2023-04-07 (×11): 0.5 mg via ORAL
  Filled 2023-03-28 (×11): qty 1

## 2023-03-28 NOTE — ED Provider Notes (Signed)
[facility] Provider Note    Event Date/Time   First MD Initiated Contact with Patient 03/28/23 1022     (approximate)   History   Shortness of Breath   HPI  Samantha Freeman is a 87 y.o. female history of CHF presents to the ER moderate respiratory distress.  Patient also with a history of A-fib recently admitted for volume overload A-fib on diltiazem.  Denies any chest pain but having significant exertional dyspnea as well as orthopnea and now dyspnea at rest.  She denies any history of COPD.  Was found to be hypoxic and placed on supplemental oxygen and eventually nonrebreather via EMS.     Physical Exam   Triage Vital Signs: ED Triage Vitals  Encounter Vitals Group     BP      Systolic BP Percentile      Diastolic BP Percentile      Pulse      Resp      Temp      Temp src      SpO2      Weight      Height      Head Circumference      Peak Flow      Pain Score      Pain Loc      Pain Education      Exclude from Growth Chart     Most recent vital signs: Vitals:   03/28/23 1330 03/28/23 1400  BP: 120/69 (!) 127/94  Pulse: (!) 116 (!) 114  Resp:  (!) 21  Temp:    SpO2: 93% 93%     Constitutional: Alert,  Eyes: Conjunctivae are normal.  Head: Atraumatic. Nose: No congestion/rhinnorhea. Mouth/Throat: Mucous membranes are moist.   Neck: Painless ROM.  Cardiovascular:   Good peripheral circulation. Respiratory: Normal respiratory effort.  No retractions.  Gastrointestinal: Soft and nontender.  Musculoskeletal:  no deformity Neurologic:  MAE spontaneously. No gross focal neurologic deficits are appreciated.  Skin:  Skin is warm, dry and intact. No rash noted. Psychiatric: Mood and affect are normal. Speech and behavior are normal.    ED Results / Procedures / Treatments   Labs (all labs ordered are listed, but only abnormal results are displayed) Labs Reviewed  BLOOD GAS, VENOUS - Abnormal; Notable for the following  components:      Result Value   Bicarbonate 32.4 (*)    Acid-Base Excess 6.6 (*)    All other components within normal limits  BRAIN NATRIURETIC PEPTIDE - Abnormal; Notable for the following components:   B Natriuretic Peptide 661.2 (*)    All other components within normal limits  BASIC METABOLIC PANEL - Abnormal; Notable for the following components:   Chloride 95 (*)    Glucose, Bld 166 (*)    BUN 29 (*)    Creatinine, Ser 1.29 (*)    GFR, Estimated 41 (*)    All other components within normal limits  CBC WITH DIFFERENTIAL/PLATELET - Abnormal; Notable for the following components:   WBC 11.3 (*)    RDW 15.9 (*)    Neutro Abs 8.2 (*)    Monocytes Absolute 1.1 (*)    All other components within normal limits  SARS CORONAVIRUS 2 BY RT PCR  DIGOXIN LEVEL  TROPONIN I (HIGH SENSITIVITY)  TROPONIN I (HIGH SENSITIVITY)     EKG  ED ECG REPORT I, Willy Eddy, the attending physician, personally viewed and interpreted this ECG.   Date: 03/28/2023  EKG  Time: 10:31  Rate: 120  Rhythm: afib with rvr  Axis: right  Intervals: normal  ST&T Change: nonspecific st abn    RADIOLOGY Please see ED Course for my review and interpretation.  I personally reviewed all radiographic images ordered to evaluate for the above acute complaints and reviewed radiology reports and findings.  These findings were personally discussed with the patient.  Please see medical record for radiology report.    PROCEDURES:  Critical Care performed: yes  .Critical Care  Performed by: Willy Eddy, MD Authorized by: Willy Eddy, MD   Critical care provider statement:    Critical care time (minutes):  35   Critical care was necessary to treat or prevent imminent or life-threatening deterioration of the following conditions:  Respiratory failure and circulatory failure   Critical care was time spent personally by me on the following activities:  Ordering and performing treatments and  interventions, ordering and review of laboratory studies, ordering and review of radiographic studies, pulse oximetry, re-evaluation of patient's condition, review of old charts, obtaining history from patient or surrogate, examination of patient, evaluation of patient's response to treatment, discussions with primary provider, discussions with consultants and development of treatment plan with patient or surrogate    MEDICATIONS ORDERED IN ED: Medications  diltiazem (CARDIZEM) 1 mg/mL load via infusion 10 mg (10 mg Intravenous Bolus from Bag 03/28/23 1324)    And  diltiazem (CARDIZEM) 125 mg in dextrose 5% 125 mL (1 mg/mL) infusion (10 mg/hr Intravenous Rate/Dose Change 03/28/23 1422)  furosemide (LASIX) injection 40 mg (40 mg Intravenous Given 03/28/23 1205)  iohexol (OMNIPAQUE) 350 MG/ML injection 75 mL (75 mLs Intravenous Contrast Given 03/28/23 1415)     IMPRESSION / MDM / ASSESSMENT AND PLAN / ED COURSE  I reviewed the triage vital signs and the nursing notes.                              Differential diagnosis includes, but is not limited to, Asthma, copd, CHF, pna, ptx, malignancy, Pe, anemia  Patient presenting to the ER for evaluation of symptoms as described above.  Based on symptoms, risk factors and considered above differential, this presenting complaint could reflect a potentially life-threatening illness therefore the patient will be placed on continuous pulse oximetry and telemetry for monitoring.  Laboratory evaluation will be sent to evaluate for the above complaints.  Placed on BiPAP for hypoxia increased work of breathing.  Blood will be sent for the blood differential.  Imaging will be ordered to evaluate for presenting complaints.   Clinical Course as of 03/28/23 1452  Thu Mar 28, 2023  1102 Chest x-ray on my review and interpretation does appear consistent with pulmonary edema.  She does appear clinically more comfortable on BiPAP. [PR]  1211 Patient was removed from  BiPAP but again hypoxic on nasal cannula will place on high flow.  She is confused which may be secondary to the hypoxia but on further review of her records patient has not been on anticoagulation in the setting of her known A-fib due to nosebleeds.  Will order CT head.  Will also order CTA given concern for possible PE as her hypoxia is worse than I would expect with her chest x-ray. [PR]    Clinical Course User Index [PR] Willy Eddy, MD     FINAL CLINICAL IMPRESSION(S) / ED DIAGNOSES   Final diagnoses:  Acute respiratory failure with hypoxia (HCC)  Rx / DC Orders   ED Discharge Orders     None        Note:  This document was prepared using Dragon voice recognition software and may include unintentional dictation errors.    Willy Eddy, MD 03/28/23 1452

## 2023-03-28 NOTE — TOC Initial Note (Addendum)
Transition of Care [facility]) - Initial/Assessment Note    Patient Details  Name: Samantha Freeman MRN: [id number] Date of Birth: [birth year]  [facility] CM/SW Contact:    Marquita Palms, LCSW Phone Number: 03/28/2023, 4:17 PM  Clinical Narrative:                  CSW spoke with patient's son who reported patient's son reported that patient would not be agreeable to SNF due to previous rehabilitation and her insurance. Patient's son reported that he would talk to her again once she has re-gain some strength. CSW spoke with [facility]  @ [phone]. Patient is currently with the agency. CSW returned call to them concerning D/C. [facility] requesting to receive an update for discharge to continue services at home. Awaiting information from MD concerning admitting or D/C.       Patient Goals and CMS Choice   Patient would like to go home with [facility]          Expected Discharge Plan and Services    HH, PT OT                                          Prior Living Arrangements/Services                       Activities of Daily Living      Permission Sought/Granted                  Emotional Assessment              Admission diagnosis:  Acute respiratory failure with hypoxia (HCC) [J96.01] Patient Active Problem List   Diagnosis Date Noted   Acute respiratory failure with hypoxia (HCC) 03/28/2023   Encephalopathy 03/28/2023   Hypercholesteremia 02/25/2023   Acute heart failure with preserved ejection fraction (HFpEF) (HCC) 02/25/2023   Generalized weakness 02/25/2023   Atrial fibrillation with RVR (HCC) 02/02/2023   Dyslipidemia 02/02/2023   Anxiety and depression 02/02/2023   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 with body mass index (BMI) of 33.0 to 33.9 in adult 02/02/2023   Acute hypoxic respiratory failure (HCC) 01/30/2023   Status post reverse arthroplasty of shoulder, left 10/13/2020   Mild aortic stenosis  05/23/2020   Moderate pulmonary valve regurgitation 05/23/2020   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   Polyneuropathy associated with underlying disease (HCC) 03/13/2019   Acute respiratory disease due to COVID-19 virus 12/06/2018   Pneumonia due to COVID-19 virus 12/06/2018   Chronic kidney disease, stage 3a (HCC) 12/06/2018   DM (diabetes mellitus) (HCC) 12/06/2018   HTN (hypertension) 12/06/2018   PNA (pneumonia) 12/06/2018   Rotator cuff tear 05/13/2018   Moderate mitral insufficiency 03/16/2015   Moderate tricuspid insufficiency 03/16/2015   GERD (gastroesophageal reflux disease) 09/22/2013   Fuchs' corneal dystrophy 08/19/2013   PCP:  Marguarite Arbour, MD Pharmacy:   [facility] [phone] [place], [place] - [address] AT [facility] OF [address] [place] [address] Phone: [phone] Fax: [phone]     Social Determinants of Health (SDOH) Social History: SDOH Screenings   Food Insecurity: No Food Insecurity (02/25/2023)  Housing: Low Risk  (02/25/2023)  Transportation Needs: No Transportation Needs (02/25/2023)  Utilities: Not At Risk (02/25/2023)  Tobacco Use: Low Risk  (03/28/2023)   SDOH Interventions:     Readmission Risk Interventions     No data to display

## 2023-03-28 NOTE — Consult Note (Addendum)
PHARMACY - ANTICOAGULATION CONSULT NOTE  Pharmacy Consult for Heparin Indication: pulmonary embolus  Allergies  Allergen Reactions   Buspirone     Other Reaction(s): Dizziness   Propofol Anaphylaxis   Zolpidem Other (See Comments)    Hallucinations   Cholestyramine Itching and Rash   Codeine Nausea Only   Hydrochlorothiazide Other (See Comments)    PATIENT DOES NOT REMEMBER   Loratadine Other (See Comments)    PATIENT DOES NOT REMEMBER   Niacin Rash   Patient Measurements: Height: 5\' 4"  (162.6 cm) Weight: 86.1 kg (189 lb 13.1 oz) IBW/kg (Calculated) : 54.7 Heparin Dosing Weight: 73.7 kg   Vital Signs: Temp: 99.1 F (37.3 C) (12/05 1329) Temp Source: Oral (12/05 1329) BP: 128/94 (12/05 1530) Pulse Rate: 119 (12/05 1530)  Labs: Recent Labs    03/28/23 1038 03/28/23 1441  HGB 14.6  --   HCT 44.9  --   PLT 210  --   CREATININE 1.29*  --   TROPONINIHS 9 9   Estimated Creatinine Clearance: 33.9 mL/min (A) (by C-G formula based on SCr of 1.29 mg/dL (H)).  Medical History: Past Medical History:  Diagnosis Date   Anemia    Anxiety    Aortic stenosis, mild    Arthritis    CKD (chronic kidney disease), stage III (HCC)    Complication of anesthesia    Propofol anaphylaxis   Cortical cataract    DDD (degenerative disc disease), lumbar    Depression    Dyspnea    GERD (gastroesophageal reflux disease)    Headache    Heart murmur    History of 2019 novel coronavirus disease (COVID-19) 12/01/2018   HLD (hyperlipidemia)    Hypertension    Pneumonia    Schatzki's ring    Seasonal allergies    T2DM (type 2 diabetes mellitus) (HCC)    Valvular insufficiency    Medications:  Patient was taking apixaban previously but it was held due to epistaxis - most recently filled in October; per patient she stopped taking in October   Assessment: Samantha Freeman is an 87 year old female that presented with difficulty breathing. PMH is significant for atrial fibrillation not  on AC, HFpEF, type 2 diabetes, hypertension, hyperlipidemia, CKD stage IIIa, depression/anxiety presented acute respiratory failure hypoxia, A-fib with RVR, and acute on chronic HFpEF. From chart review, patient was taking apixaban previously but it was held due to epistaxis. CTA of the chest notable for bilateral small to moderate PE. Pharmacy has been consulted for initiation and management of a heparin infusion. Baseline labs: Hgb 14.6, PLT 210, aPTT and PT/INR ordered.   Goal of Therapy:  Heparin level 0.3 - 0.7  Monitor platelets by anticoagulation protocol: Yes   Plan:  Give bolus of 4500 units x 1  Start heparin infusion at 1250 units/hr  Check HL 8 hours after initiation of infusion  Monitor CBC and HL daily   Littie Deeds, PharmD Pharmacy Resident  03/28/2023 5:05 PM

## 2023-03-28 NOTE — H&P (Addendum)
History and Physical    Patient: Samantha Freeman HQI:[id number] DOB: 1935/05/21 DOA: 03/28/2023 DOS: the patient was seen and examined on 03/28/2023 PCP: Marguarite Arbour, MD  Patient coming from: Home  Chief Complaint:  Chief Complaint  Patient presents with   Shortness of Breath   HPI: Samantha Freeman is a 87 y.o. female with medical history significant of atrial fibrillation not on AC, HFpEF, type 2 diabetes, hypertension, hyperlipidemia, CKD stage IIIa, depression/anxiety presented acute respiratory failure hypoxia, A-fib with RVR, acute on chronic HFpEF.  Patient reports increased work of breathing over the past 1 to 2 days.  Positive orthopnea and PND.  Mild palpitations.  No chest pain.  No abdominal pain.?  Diarrhea.  No focal hemiparesis or confusion.  Noted to have been admitted for similar issues November 2024.  Was formally evaluated by cardiology.  Discharged on diltiazem metoprolol as well as digoxin.  Patient denies any missed doses of medication.  No reported high salt intake.  Baseline type 2 diabetes.  His report of sugars dropping to the 70s at times.  No reported caffeine use. Presented to the ER afebrile, heart rate into the 120s.  BP stable.  Initially on BiPAP, transition to high flow nasal cannula to keep O2 sats greater than 93%.  White count 11.3, hemoglobin 14.6, platelets 210, troponin within norm limits.  VBG stable.  Creatinine 1.3 with GFR in the 40s, glucose 166.  COVID flu and RSV negative.  Chest x-ray with cardiomegaly.?  Left basilar opacity.  CT head and CTA of the chest pending. Review of Systems: As mentioned in the history of present illness. All other systems reviewed and are negative. Past Medical History:  Diagnosis Date   Anemia    Anxiety    Aortic stenosis, mild    Arthritis    CKD (chronic kidney disease), stage III (HCC)    Complication of anesthesia    Propofol anaphylaxis   Cortical cataract    DDD (degenerative disc disease), lumbar     Depression    Dyspnea    GERD (gastroesophageal reflux disease)    Headache    Heart murmur    History of 2019 novel coronavirus disease (COVID-19) 12/01/2018   HLD (hyperlipidemia)    Hypertension    Pneumonia    Schatzki's ring    Seasonal allergies    T2DM (type 2 diabetes mellitus) (HCC)    Valvular insufficiency    Past Surgical History:  Procedure Laterality Date   ABDOMINAL HYSTERECTOMY     APPENDECTOMY     BICEPT TENODESIS Right 05/13/2018   Procedure: BICEPS TENODESIS;  Surgeon: Christena Flake, MD;  Location: [facility] ORS;  Service: Orthopedics;  Laterality: Right;   CARDIAC CATHETERIZATION     COLONOSCOPY     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   EYE SURGERY Right 09/2013   Corneal transplant   JOINT REPLACEMENT Right 2015   knee   REVERSE SHOULDER ARTHROPLASTY Left 10/13/2020   Procedure: REVERSE SHOULDER ARTHROPLASTY;  Surgeon: Christena Flake, MD;  Location: [facility] ORS;  Service: Orthopedics;  Laterality: Left;   SHOULDER ARTHROSCOPY WITH ROTATOR CUFF REPAIR Right 05/13/2018   Procedure: SHOULDER ARTHROSCOPY WITH DEBRIDEMENT, DECOMPRESSION AND ROTATOR CUFF REPAIR;  Surgeon: Christena Flake, MD;  Location: [facility] ORS;  Service: Orthopedics;  Laterality: Right;   TONSILLECTOMY     Social History:  reports that she has never smoked. She has never used smokeless tobacco. She reports that she does not drink alcohol and  does not use drugs.  Allergies  Allergen Reactions   Buspirone     Other Reaction(s): Dizziness   Propofol Anaphylaxis   Zolpidem Other (See Comments)    Hallucinations   Cholestyramine Itching and Rash   Codeine Nausea Only   Hydrochlorothiazide Other (See Comments)    PATIENT DOES NOT REMEMBER   Loratadine Other (See Comments)    PATIENT DOES NOT REMEMBER   Niacin Rash    Family History  Problem Relation Age of Onset   Breast cancer Paternal Aunt     Prior to Admission medications   Medication Sig Start Date End Date Taking? Authorizing  Provider  aspirin EC 81 MG tablet Take 1 tablet (81 mg total) by mouth daily. Swallow whole. 03/07/23 04/06/23  Charise Killian, MD  blood glucose meter kit and supplies KIT Dispense based on patient and insurance preference. Use up to four times daily as directed. (FOR ICD-9 250.00, 250.01). For QAC - HS accuchecks. 12/11/18   Leroy Sea, MD  Calcium Carb-Cholecalciferol (CALCIUM 600/VITAMIN D3 PO) Take 1 tablet by mouth daily.    [provider]  Coenzyme Q10 10 MG capsule Take 10 mg by mouth every morning.    [provider]  dapagliflozin propanediol (FARXIGA) 10 MG TABS tablet Take 10 mg by mouth daily. 11/03/20   [provider]  digoxin (LANOXIN) 0.125 MG tablet Take 1 tablet (0.125 mg total) by mouth every other day. 03/14/23 04/13/23  Charise Killian, MD  diltiazem (CARDIZEM CD) 180 MG 24 hr capsule Take 1 capsule (180 mg total) by mouth daily. 03/13/23 04/12/23  Charise Killian, MD  fenofibrate (TRICOR) 48 MG tablet Take 1 tablet by mouth daily. 12/23/22   [provider]  ferrous sulfate 325 (65 FE) MG tablet Take 325 mg by mouth daily with breakfast. Patient not taking: Reported on 02/25/2023    [provider]  furosemide (LASIX) 20 MG tablet Take 20 mg by mouth daily as needed for fluid or edema. 05/22/22 12/31/23  [provider]  gabapentin (NEURONTIN) 100 MG capsule Take 200 mg by mouth 3 (three) times daily.    [provider]  glucose blood (FREESTYLE LITE) test strip For glucose testing every before meals at bedtime. Diagnosis E 11.65  Can substitute to any accepted brand 12/11/18   Leroy Sea, MD  hydrOXYzine (ATARAX) 25 MG tablet Take 1 tablet by mouth 2 (two) times daily. 02/16/21   [provider]  insulin lispro (HUMALOG) 100 UNIT/ML injection Inject 7-19 Units into the skin 3 (three) times daily before meals. Blood Glucose level: 140-199 - 7 units, 200-250 - 9 units, 251-299 - 13 units,   300-349 - 17 units,  350 or above 19 units.    [provider]  Insulin Syringe-Needle U-100 25G X 1" 1 ML MISC For 4 times a day insulin SQ, 1 month supply. Diagnosis E11.65 12/11/18   Leroy Sea, MD  LANTUS SOLOSTAR 100 UNIT/ML Solostar Pen Inject 36 Units into the skin at bedtime. 02/22/23   [provider]  magnesium oxide (MAG-OX) 400 MG tablet Take 800 mg by mouth 3 (three) times daily. 05/18/13   [provider]  metoprolol tartrate (LOPRESSOR) 50 MG tablet Take 1 tablet (50 mg total) by mouth 2 (two) times daily. 03/12/23 04/11/23  Charise Killian, MD  Multiple Vitamin (MULTIVITAMIN) tablet Take 1 tablet by mouth daily. Women's Daily Multivitamin    [provider]  pantoprazole (PROTONIX) 40 MG  tablet Take 1 tablet (40 mg total) by mouth daily. 02/05/23   Jonah Blue, MD  PARoxetine (PAXIL) 10 MG tablet Take 10 mg by mouth daily. 02/22/23   [provider]  prednisoLONE acetate (PRED FORTE) 1 % ophthalmic suspension Place 1 drop into both eyes at bedtime. 10/04/17   [provider]  rosuvastatin (CRESTOR) 10 MG tablet Take 10 mg by mouth daily.    [provider]  traZODone (DESYREL) 50 MG tablet Take 50 mg by mouth at bedtime.    [provider]  vitamin B-12 (CYANOCOBALAMIN) 1000 MCG tablet Take 1,000 mcg by mouth daily.    [provider]  apixaban (ELIQUIS) 5 MG TABS tablet Take 1 tablet (5 mg total) by mouth every 12 (twelve) hours. 02/01/23 02/22/23      Physical Exam: Vitals:   03/28/23 1330 03/28/23 1400 03/28/23 1445 03/28/23 1500  BP: 120/69 (!) 127/94  122/82  Pulse: (!) 116 (!) 114 (!) 112   Resp:  (!) 21 18   Temp:      TempSrc:      SpO2: 93% 93% 94%   Weight:      Height:       Physical Exam Constitutional:      Appearance: She is normal weight.  HENT:     Head: Normocephalic and atraumatic.     Nose: Nose normal.     Mouth/Throat:     Mouth: Mucous membranes are moist.   Eyes:     Pupils: Pupils are equal, round, and reactive to light.  Cardiovascular:     Rate and Rhythm: Normal rate and regular rhythm.  Pulmonary:     Effort: Pulmonary effort is normal.  Abdominal:     General: Bowel sounds are normal.  Musculoskeletal:        General: Normal range of motion.     Cervical back: Normal range of motion.  Skin:    General: Skin is warm.  Neurological:     General: No focal deficit present.     Mental Status: She is oriented to person, place, and time.  Psychiatric:        Mood and Affect: Mood normal.     Data Reviewed:  There are no new results to review at this time. CT HEAD WO CONTRAST ( ) CLINICAL DATA:  Mental status change, unknown cause.  EXAM: CT HEAD WITHOUT CONTRAST  TECHNIQUE: Contiguous axial images were obtained from the base of the skull through the vertex without intravenous contrast.  RADIATION DOSE REDUCTION: This exam was performed according to the departmental dose-optimization program which includes automated exposure control, adjustment of the mA and/or kV according to patient size and/or use of iterative reconstruction technique.  COMPARISON:  None Available.  FINDINGS: Brain: There is no evidence of an acute infarct, intracranial hemorrhage, mass, midline shift, or extra-axial fluid collection. Mild cerebral atrophy is within normal limits for age. Hypodensities in the cerebral white matter nonspecific but compatible with mild chronic small vessel ischemic disease.  Vascular: Calcified atherosclerosis at the skull base. No hyperdense vessel.  Skull: No acute fracture or suspicious osseous lesion.  Sinuses/Orbits: Chronic left sphenoid sinusitis with near complete opacification. Mild mucosal thickening in the paranasal sinuses elsewhere. Clear mastoid air cells. Bilateral cataract extraction.  Other: None.  IMPRESSION: 1. No evidence of acute intracranial abnormality. 2. Mild chronic small vessel  ischemic disease.  Electronically Signed   By: Sebastian Ache M.D.   On: 03/28/2023 15:52 DG Chest Portable 1 View  CLINICAL DATA:  Shortness of breath  EXAM: PORTABLE CHEST - 1 VIEW  COMPARISON:  02/25/2023  FINDINGS: Unchanged borderline cardiomegaly. No significant pulmonary vascular congestion.  Unchanged mild left basilar opacity favored to be atelectasis.  Reversed left total shoulder prosthesis again seen.  IMPRESSION: 1. Unchanged mild cardiomegaly. 2. Unchanged left basilar opacity likely due to atelectasis.  Electronically Signed   By: Acquanetta Belling M.D.   On: 03/28/2023 11:42  Lab Results  Component Value Date   WBC 11.3 (H) 03/28/2023   HGB 14.6 03/28/2023   HCT 44.9 03/28/2023   MCV 91.4 03/28/2023   PLT 210 03/28/2023   Last metabolic panel Lab Results  Component Value Date   GLUCOSE 166 (H) 03/28/2023   NA 137 03/28/2023   K 4.3 03/28/2023   CL 95 (L) 03/28/2023   CO2 30 03/28/2023   BUN 29 (H) 03/28/2023   CREATININE 1.29 (H) 03/28/2023   GFRNONAA 41 (L) 03/28/2023   CALCIUM 9.2 03/28/2023   PROT 7.5 01/30/2023   ALBUMIN 3.9 01/30/2023   BILITOT 1.0 01/30/2023   ALKPHOS 52 01/30/2023   AST 24 01/30/2023   ALT 19 01/30/2023   ANIONGAP 12 03/28/2023    Assessment and Plan: Atrial fibrillation with RVR (HCC) Baseline A-fib with heart rate into the 120s EKG A-fib with RVR Started on dilt drip in the ER Will continue Cont home metoprolol  Noted admission for similar issues November 2024 Not on anticoagulation given hx/o epistaxis  [facility] cardiology consulted Follow-up recommendations  Acute hypoxic respiratory failure (HCC) Decompensated respiratory failure requiring initially BiPAP and now transition to high flow nasal cannula in the setting A-fib with RVR and acute on chronic HFpEF Chest x-ray with cardiomegaly CTA of the chest pending to rule out PE versus infiltrate Continue with IV diuresis Supplemental oxygen as  needed Monitor  Acute heart failure with preserved ejection fraction (HFpEF) (HCC) 2D echo November 2024 with EF of 60 to 65% BNP 600s with positive cardiomegaly on chest x-ray 1-2+ pitting edema bilaterally Status post IV Lasix in ER Will continue with diuresis pending cardiology evaluation Monitor   Encephalopathy Reported patient with transient episode of hallucination Appears to be alert at the bedside Will monitor for now CT head pending Add on VBG in the setting of hypoxia As needed Zyprexa for agitation Monitor  GERD (gastroesophageal reflux disease) PPI    HTN (hypertension) BP stable Titrate regimen  DM (diabetes mellitus) (HCC) Blood sugar in 160s  Does report some episodes of hypoglycemia at home  SSI  Monitor    Chronic kidney disease, stage 3a (HCC) Cr 1.3 w/ GFR in 40s  Looks to be near baseline  Monitor       Advance Care Planning:   Code Status: Limited: Do not attempt resuscitation (DNR) -DNR-LIMITED -Do Not Intubate/DNI    Consults: Cardiology   Family Communication: Family at the bedside   Severity of Illness: The appropriate patient status for this patient is INPATIENT. Inpatient status is judged to be reasonable and necessary in order to provide the required intensity of service to ensure the patient's safety. The patient's presenting symptoms, physical exam findings, and initial radiographic and laboratory data in the context of their chronic comorbidities is felt to place them at high risk for further clinical deterioration. Furthermore, it is not anticipated that the patient will be medically stable for discharge from the hospital within 2 midnights of admission.   * I certify that at the point of admission it is my  clinical judgment that the patient will require inpatient hospital care spanning beyond 2 midnights from the point of admission due to high intensity of service, high risk for further deterioration and high frequency of  surveillance required.*  Author: Floydene Flock, MD 03/28/2023 3:51 PM  For on call review [email or web address].

## 2023-03-28 NOTE — ED Notes (Signed)
Pt states she "sees people, faces that appear on the wall and disappear." Roxan Hockey, MD, made aware.

## 2023-03-28 NOTE — Progress Notes (Signed)
PHARMACIST - PHYSICIAN COMMUNICATION  CONCERNING:  Enoxaparin (Lovenox) for DVT Prophylaxis    RECOMMENDATION: Patient was prescribed enoxaprin 40mg  q24 hours for VTE prophylaxis.   Filed Weights   03/28/23 1029  Weight: 86.1 kg (189 lb 13.1 oz)    Body mass index is 32.58 kg/m.  Estimated Creatinine Clearance: 33.9 mL/min (A) (by C-G formula based on SCr of 1.29 mg/dL (H)).   Based on [facility] policy patient is candidate for enoxaparin 0.5mg /kg TBW SQ every 24 hours based on BMI being >30.  DESCRIPTION: Pharmacy has adjusted enoxaparin dose per [facility] policy.  Patient is now receiving enoxaparin 42.5 mg every 24 hours    Rockwell Alexandria, PharmD Clinical Pharmacist  03/28/2023 3:50 PM

## 2023-03-28 NOTE — Assessment & Plan Note (Addendum)
Last ejection fraction 60%. On Lasix 20 mg daily and metoprolol.

## 2023-03-28 NOTE — Assessment & Plan Note (Addendum)
Initially required BiPAP and then high flow nasal cannula.  Yesterday off oxygen.  Overnight oximetry showed nocturnal desaturations for most of the night in the 80s.  Will prescribe oxygen 2 L nasal cannula at night.

## 2023-03-28 NOTE — Assessment & Plan Note (Signed)
PPI ?

## 2023-03-28 NOTE — Assessment & Plan Note (Deleted)
Blood sugar in 160s  Does report some episodes of hypoglycemia at home  SSI  Monitor

## 2023-03-28 NOTE — Assessment & Plan Note (Deleted)
Reported patient with transient episode of hallucination Appears to be alert at the bedside Will monitor for now CT head pending Add on VBG in the setting of hypoxia As needed Zyprexa for agitation Monitor

## 2023-03-28 NOTE — ED Notes (Signed)
Provider notified of nosebleed while pt on heparin drip. Provider ordered to continue heparin drip and monitor nosebleed. Will attempt to control with other methods at this time. Provider also notified of pt's HR maintaining around 90s-1teens and pt maxed out on cardizem. Provider okay to maintain cardizem rate as long as HR is not sustaining above 120s.

## 2023-03-28 NOTE — ED Triage Notes (Signed)
Pt to ED from home via [facility] for c/o difficulty breathing. EMS states diminished in lower lobes, pt given one duo neb. O2 70% on room air on EMS arrival, 96% on 6L. HR 138, afib. Pt has hx Afib. Pt recently seen in this facility for fluid on lungs.

## 2023-03-28 NOTE — Assessment & Plan Note (Deleted)
Cr 1.3 w/ GFR in 40s  Looks to be near baseline  Monitor

## 2023-03-28 NOTE — Assessment & Plan Note (Addendum)
Patient on metoprolol and Cardizem

## 2023-03-28 NOTE — Assessment & Plan Note (Addendum)
Persistent atrial fibrillation.  Continue Eliquis, metoprolol, diltiazem and digoxin.  Patient held her heart rate with ambulating yesterday.

## 2023-03-28 NOTE — Progress Notes (Signed)
Received report from radiology CTA of the chest notable for bilateral small to moderate PE Will place patient on heparin drip in the interim Noted prior history of epistaxis associated with anticoagulation use Will otherwise monitor closely Follow

## 2023-03-29 ENCOUNTER — Encounter
Admission: EM | Disposition: A | Discharge status: Home Health | Payer: Self-pay | Source: Home / Self Care | Attending: Hospitalist

## 2023-03-29 DIAGNOSIS — J9601 Acute respiratory failure with hypoxia: Secondary | ICD-10-CM | POA: Diagnosis not present

## 2023-03-29 LAB — CBC
HCT: 40.1 % (ref 36.0–46.0)
Hemoglobin: 13.1 g/dL (ref 12.0–15.0)
MCH: 29.9 pg (ref 26.0–34.0)
MCHC: 32.7 g/dL (ref 30.0–36.0)
MCV: 91.6 fL (ref 80.0–100.0)
Platelets: 191 10*3/uL (ref 150–400)
RBC: 4.38 MIL/uL (ref 3.87–5.11)
RDW: 15.7 % — ABNORMAL HIGH (ref 11.5–15.5)
WBC: 10.9 10*3/uL — ABNORMAL HIGH (ref 4.0–10.5)
nRBC: 0 % (ref 0.0–0.2)

## 2023-03-29 LAB — COMPREHENSIVE METABOLIC PANEL
ALT: 13 U/L (ref 0–44)
AST: 16 U/L (ref 15–41)
Albumin: 3.5 g/dL (ref 3.5–5.0)
Alkaline Phosphatase: 46 U/L (ref 38–126)
Anion gap: 13 (ref 5–15)
BUN: 26 mg/dL — ABNORMAL HIGH (ref 8–23)
CO2: 27 mmol/L (ref 22–32)
Calcium: 8.6 mg/dL — ABNORMAL LOW (ref 8.9–10.3)
Chloride: 95 mmol/L — ABNORMAL LOW (ref 98–111)
Creatinine, Ser: 1.05 mg/dL — ABNORMAL HIGH (ref 0.44–1.00)
GFR, Estimated: 52 mL/min — ABNORMAL LOW (ref >60)
Glucose, Bld: 163 mg/dL — ABNORMAL HIGH (ref 70–99)
Potassium: 3.7 mmol/L (ref 3.5–5.1)
Sodium: 135 mmol/L (ref 135–145)
Total Bilirubin: 1.2 mg/dL — ABNORMAL HIGH (ref <1.2)
Total Protein: 6.7 g/dL (ref 6.5–8.1)

## 2023-03-29 LAB — LACTIC ACID, PLASMA: Lactic Acid, Venous: 1 mmol/L (ref 0.5–1.9)

## 2023-03-29 LAB — GLUCOSE, CAPILLARY
Glucose-Capillary: 161 mg/dL — ABNORMAL HIGH (ref 70–99)
Glucose-Capillary: 164 mg/dL — ABNORMAL HIGH (ref 70–99)
Glucose-Capillary: 188 mg/dL — ABNORMAL HIGH (ref 70–99)
Glucose-Capillary: 211 mg/dL — ABNORMAL HIGH (ref 70–99)
Glucose-Capillary: 234 mg/dL — ABNORMAL HIGH (ref 70–99)
Glucose-Capillary: 262 mg/dL — ABNORMAL HIGH (ref 70–99)
Glucose-Capillary: 276 mg/dL — ABNORMAL HIGH (ref 70–99)

## 2023-03-29 LAB — DIGOXIN LEVEL: Digoxin Level: <0.2 ng/mL — ABNORMAL LOW (ref 0.8–2.0)

## 2023-03-29 LAB — HEPARIN LEVEL (UNFRACTIONATED)
Heparin Unfractionated: 0.66 [IU]/mL (ref 0.30–0.70)
Heparin Unfractionated: 0.74 [IU]/mL — ABNORMAL HIGH (ref 0.30–0.70)
Heparin Unfractionated: 0.62 [IU]/mL (ref 0.30–0.70)

## 2023-03-29 SURGERY — EMBOLIZATION (CATH LAB)
Anesthesia: Moderate Sedation

## 2023-03-29 MED ORDER — FUROSEMIDE 10 MG/ML IJ SOLN
20.0000 mg | Freq: Once | INTRAMUSCULAR | Status: AC
  Administered 2023-03-29: 20 mg via INTRAVENOUS
  Filled 2023-03-29: qty 2

## 2023-03-29 MED ORDER — MENTHOL 3 MG MT LOZG
1.0000 | LOZENGE | OROMUCOSAL | Status: DC | PRN
  Administered 2023-03-29: 3 mg via ORAL
  Filled 2023-03-29: qty 9

## 2023-03-29 MED ORDER — FUROSEMIDE 10 MG/ML IJ SOLN: 40.0000 mg | Freq: Every day | INTRAMUSCULAR | Status: DC

## 2023-03-29 MED ORDER — PREDNISOLONE ACETATE 1 % OP SUSP
1.0000 [drp] | Freq: Every day | OPHTHALMIC | Status: DC
  Administered 2023-03-29 – 2023-04-07 (×10): 1 [drp] via OPHTHALMIC
  Filled 2023-03-29: qty 1

## 2023-03-29 MED ORDER — MUPIROCIN 2 % EX OINT
TOPICAL_OINTMENT | Freq: Two times a day (BID) | CUTANEOUS | Status: DC
  Administered 2023-04-05: 1 via NASAL
  Filled 2023-03-29: qty 22

## 2023-03-29 MED ORDER — INSULIN GLARGINE-YFGN 100 UNIT/ML ~~LOC~~ SOLN
10.0000 [IU] | Freq: Every day | SUBCUTANEOUS | Status: DC
  Administered 2023-03-30: 10 [IU] via SUBCUTANEOUS
  Filled 2023-03-29 (×2): qty 0.1

## 2023-03-29 MED ORDER — INSULIN ASPART 100 UNIT/ML IJ SOLN
3.0000 [IU] | Freq: Once | INTRAMUSCULAR | Status: AC
  Administered 2023-03-29: 3 [IU] via SUBCUTANEOUS
  Filled 2023-03-29: qty 1

## 2023-03-29 MED ORDER — SALINE SPRAY 0.65 % NA SOLN
2.0000 | Freq: Four times a day (QID) | NASAL | Status: DC
  Administered 2023-03-29 – 2023-04-05 (×27): 2 via NASAL
  Filled 2023-03-29: qty 44

## 2023-03-29 MED ORDER — INSULIN ASPART 100 UNIT/ML IJ SOLN
0.0000 [IU] | INTRAMUSCULAR | Status: DC
  Administered 2023-03-29: 5 [IU] via SUBCUTANEOUS
  Administered 2023-03-29: 3 [IU] via SUBCUTANEOUS
  Administered 2023-03-29 – 2023-03-30 (×2): 5 [IU] via SUBCUTANEOUS
  Administered 2023-03-30: 3 [IU] via SUBCUTANEOUS
  Administered 2023-03-30: 7 [IU] via SUBCUTANEOUS
  Administered 2023-03-30: 2 [IU] via SUBCUTANEOUS
  Administered 2023-03-30: 3 [IU] via SUBCUTANEOUS
  Filled 2023-03-29 (×8): qty 1

## 2023-03-29 NOTE — Plan of Care (Signed)

## 2023-03-29 NOTE — Progress Notes (Signed)
Report given to receiving nurse for room 251.

## 2023-03-29 NOTE — Consult Note (Signed)
Rai, Repasky [id number] 1936/01/09 Darlin Priestly, MD  Reason for Consult: epistaxis  HPI: 87 year old female with history of Afib admitted for anticoagulation due to pulmonary emboli.  History of prior epistaxis and evaluated by Dr. Jenne Campus previously and underwent cauterization and packing as well as cauterization by myself.  Previous epistaxis only stopped following packing and then patient previously had refused to restart anticoagulation.  Restarted on Heparin last night and developed epistaxis that has since resolved.  Patient is very adamant that she would rather die than have nasal packing again.  Very anxious regarding nasal bleeding and nasal obstruction.  Denies any trauma.  Currently on 8L nasal cannula.  Allergies:  Allergies  Allergen Reactions   Buspirone     Other Reaction(s): Dizziness   Propofol Anaphylaxis   Zolpidem Other (See Comments)    Hallucinations   Cholestyramine Itching and Rash   Codeine Nausea Only   Hydrochlorothiazide Other (See Comments)    PATIENT DOES NOT REMEMBER   Loratadine Other (See Comments)    PATIENT DOES NOT REMEMBER   Niacin Rash    ROS: Review of systems normal other than 12 systems except per HPI.  PMH:  Past Medical History:  Diagnosis Date   Anemia    Anxiety    Aortic stenosis, mild    Arthritis    CKD (chronic kidney disease), stage III (HCC)    Complication of anesthesia    Propofol anaphylaxis   Cortical cataract    DDD (degenerative disc disease), lumbar    Depression    Dyspnea    GERD (gastroesophageal reflux disease)    Headache    Heart murmur    History of 2019 novel coronavirus disease (COVID-19) 12/01/2018   HLD (hyperlipidemia)    Hypertension    Pneumonia    Schatzki's ring    Seasonal allergies    T2DM (type 2 diabetes mellitus) (HCC)    Valvular insufficiency     FH:  Family History  Problem Relation Age of Onset   Breast cancer Paternal Aunt     SH:  Social History   Socioeconomic History    Marital status: Widowed    Spouse name: Not on file   Number of children: Not on file   Years of education: Not on file   Highest education level: Not on file  Occupational History   Not on file  Tobacco Use   Smoking status: Never   Smokeless tobacco: Never  Vaping Use   Vaping status: Never Used  Substance and Sexual Activity   Alcohol use: Never   Drug use: Never   Sexual activity: Not Currently  Other Topics Concern   Not on file  Social History Narrative   Lives alone, has support from son who is a Optician, dispensing and daughter in Social worker. Will be with mother when she has surgery.   Social Determinants of Health   Financial Resource Strain: Not on file  Food Insecurity: No Food Insecurity (03/28/2023)   Hunger Vital Sign    Worried About Running Out of Food in the Last Year: Never true    Ran Out of Food in the Last Year: Never true  Transportation Needs: No Transportation Needs (03/28/2023)   PRAPARE - Administrator, Civil Service (Medical): No    Lack of Transportation (Non-Medical): No  Physical Activity: Not on file  Stress: Not on file  Social Connections: Not on file  Intimate Partner Violence: Not At Risk (03/28/2023)   Humiliation, Afraid,  Rape, and Kick questionnaire    Fear of Current or Ex-Partner: No    Emotionally Abused: No    Physically Abused: No    Sexually Abused: No    PSH:  Past Surgical History:  Procedure Laterality Date   ABDOMINAL HYSTERECTOMY     APPENDECTOMY     BICEPT TENODESIS Right 05/13/2018   Procedure: BICEPS TENODESIS;  Surgeon: Christena Flake, MD;  Location: [facility] ORS;  Service: Orthopedics;  Laterality: Right;   CARDIAC CATHETERIZATION     COLONOSCOPY     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   EYE SURGERY Right 09/2013   Corneal transplant   JOINT REPLACEMENT Right 2015   knee   REVERSE SHOULDER ARTHROPLASTY Left 10/13/2020   Procedure: REVERSE SHOULDER ARTHROPLASTY;  Surgeon: Christena Flake, MD;  Location: [facility]  ORS;  Service: Orthopedics;  Laterality: Left;   SHOULDER ARTHROSCOPY WITH ROTATOR CUFF REPAIR Right 05/13/2018   Procedure: SHOULDER ARTHROSCOPY WITH DEBRIDEMENT, DECOMPRESSION AND ROTATOR CUFF REPAIR;  Surgeon: Christena Flake, MD;  Location: [facility] ORS;  Service: Orthopedics;  Laterality: Right;   TONSILLECTOMY      Physical  Exam:  GEN-  NAD, sitting upright in bed with nasal canula in place NEURO-  CN 2-12 grossly intact and symmetric. EARS- external ears clear NOSE-  nasal canula in place.  Very dry mucosa.  Large dried blood and crust filling majority of right nasal cavity, no active bleeding.  Right side anesthetized with Gen-nasal and lidocaine for 10 minutes and attempted to remove crusting/clot without success due to significant adherence to underlying mucosa, stopped due to concern for bleeding OC/OP-  no active bleeding present NECK- supple with no LAD RESP- mildly labored CARD-  irregular    A/P: Epistaxis, AFib, Pulmonary emboli.  Had an extensive conversation with patient and nurse regarding options and treatment for epistaxis for over 40 minutes.  Patient is adamant she does not want nasal packing and reports she would rather die than have packing again.  Unable to visualize any bleeding site on exam but also unable to remove crusting due to dryness and it certainly would bleed if I removed it in its current state.  Will try and soft the crusting with Nasal saline sprays and ointment overnight and re-evaluate in morning.  Option is to consider embolization by vascular surgery if she continues to intermittently bleed.  Options are somewhat limited on my end if she begins to bleed significantly and yet continues to decline packing.  If bleeding worsens significantly, may have to decide between epistaxis vs anticoagulation.  Patient is DNR/DNI and reports she would rather die than have nasal packing in place.  I reiterated this many times to patient that this limits our ability to treat  nose bleeds significantly especially with anticoagulation.  Will recheck in morning.  If bleeds tonight, recommend pressure with clamp, Afrin, humidification.   Bud Face 03/29/2023 6:25 PM

## 2023-03-29 NOTE — Progress Notes (Signed)
PROGRESS NOTE    Samantha Freeman  NFA:[id number] DOB: [birth year] DOA: 03/28/2023 PCP: Marguarite Arbour, MD  IC11A/IC11A-AA  LOS: 1 day   Brief hospital course:   Assessment & Plan: Samantha Freeman is a 87 y.o. female with medical history significant of atrial fibrillation not on AC, HFpEF, type 2 diabetes, hypertension, hyperlipidemia, CKD stage IIIa, depression/anxiety presented acute respiratory failure hypoxia, A-fib with RVR, acute on chronic HFpEF.  Patient reports increased work of breathing over the past 1 to 2 days.    Atrial fibrillation with RVR (HCC) --3 hospitalizations in 2 months for the same.  Most recently was placed on metoprolol, diltiazem, and digoxin for rate control.  Not on anticoagulation due to hx/o epistaxis.   --started on dilt gtt, [facility] cardiology consulted. Plan: --cont dilt gtt  --cont metop 50 BID --cont heparin gtt  Acute hypoxic respiratory failure (HCC) Decompensated respiratory failure requiring initially BiPAP and now transition to high flow nasal cannula 8L. --in the setting A-fib with RVR and acute on chronic HFpEF and PE. Plan: --treat underlying causes --Continue supplemental O2 to keep sats >=92%, wean as tolerated  Acute PE --New bilateral few segmental and smaller pulmonary emboli.  --cont heparin gtt for now  Acute heart failure with preserved ejection fraction (HFpEF) (HCC) 2D echo November 2024 with EF of 60 to 65% BNP 600s with positive cardiomegaly on chest x-ray 1-2+ pitting edema bilaterally --s/p IV lasix 40 x1 in ED Plan: --cont IV lasix 20, per cardio  Recurrent epistaxis --in response to anticoagulation.  Per ENT, has been "packed cauterized 3x by [place] in last 2 months." --ENT consult today  --Vascular surgery consult today for possible embolization.   Encephalopathy Reported patient with transient episode of hallucination Appears to be alert at the bedside Will monitor for now CT head pending Add on VBG in the  setting of hypoxia As needed Zyprexa for agitation Monitor  GERD (gastroesophageal reflux disease) PPI    HTN (hypertension) BP stable Titrate regimen  DM (diabetes mellitus) (HCC) --SSI   Chronic kidney disease, stage 3a (HCC) Cr 1.3 w/ GFR in 40s  Looks to be near baseline  Monitor    DVT prophylaxis: IO:[id number] gtt Code Status: DNR  Family Communication:  Level of care: Progressive Dispo:   The patient is from: home Anticipated d/c is to: home Anticipated d/c date is: to be determined    Subjective and Interval History:  Pt reported having nose bleed from right side last night, which stopped with [place], now feeling clogged up.   Objective: Vitals:   03/29/23 1300 03/29/23 1400 03/29/23 1435 03/29/23 1500  BP: 125/76 (!) 127/90  (!) 128/104  Pulse: (!) 116 (!) 116 (!) 110 (!) 115  Resp: 16 (!) 23 (!) 21 (!) 31  Temp:      TempSrc:      SpO2: 91% 90% 91% (!) 89%  Weight:      Height:        Intake/Output Summary (Last 24 hours) at 03/29/2023 1725 Last data filed at 03/29/2023 1500 Gross per 24 hour  Intake 634.23 ml  Output 2800 ml  Net -2165.77 ml   Filed Weights   03/28/23 1029 03/28/23 2145  Weight: 86.1 kg 81 kg    Examination:   Constitutional: NAD, AAOx3 HEENT: conjunctivae and lids normal, EOMI CV: No cyanosis.   RESP: normal respiratory effort, on 8L Neuro: II - XII grossly intact.   Psych: Normal mood and affect.  Appropriate judgement and reason  Data Reviewed: I have personally reviewed labs and imaging studies  Time spent: 50 minutes  Darlin Priestly, MD [facility] If 7PM-7AM, please contact night-coverage 03/29/2023, 5:25 PM

## 2023-03-29 NOTE — Progress Notes (Signed)
ENT at bedside to evaluate patient for intermittent nose bleed. Patient expressed not to have packing done in the event of a nose bleed. No interventions at this time.

## 2023-03-29 NOTE — Progress Notes (Signed)
Arrived from [facility] ICU to 2A, placed on cardiac monitor.  Continues on heparin gtt and cardizem gtt.     03/29/23 1835  Vitals  BP 115/66  MAP (mmHg) 80  BP Location Left Arm  BP Method Automatic  Patient Position (if appropriate) Lying  Pulse Rate 97  Pulse Rate Source Monitor  ECG Heart Rate 98  Resp (!) 23  Level of Consciousness  Level of Consciousness Alert  MEWS COLOR  MEWS Score Color Green  Oxygen Therapy  SpO2 93 %  O2 Device HFNC  O2 Flow Rate (L/min) 8 L/min  MEWS Score  MEWS Temp 0  MEWS Systolic 0  MEWS Pulse 0  MEWS RR 1  MEWS LOC 0  MEWS Score 1

## 2023-03-29 NOTE — Progress Notes (Signed)
PT Cancellation Note  Patient Details Name: [name] MRN: [id number] DOB: [birth year]   Cancelled Treatment:    Reason Eval/Treat Not Completed: Medical issues which prohibited therapy. Consult received and chart reviewed. Pt not medically ready for PT evaluation this AM, PT to follow up as able (noted to start heparin 12/6 1810, PT guidelines indicate 24hrs of anticoagulation for PE prior to therapy).  Olga Coaster PT, DPT 8:23 AM,03/29/23

## 2023-03-29 NOTE — Progress Notes (Signed)
OT Cancellation Note  Patient Details Name: Mulan Yarwood MRN: [id number] DOB: 1935-07-09   Cancelled Treatment:    Reason Eval/Treat Not Completed: Patient not medically ready. Consult received and chart reviewed. Pt not medically ready for OT evaluation this AM, will follow up as able (noted to start heparin 12/6 1810, OT guidelines indicate 24hrs of anticoagulation for PE prior to therapy).   Emeline Darling Kedar Sedano 03/29/2023, 8:29 AM

## 2023-03-29 NOTE — Consult Note (Signed)
PHARMACY - ANTICOAGULATION CONSULT NOTE  Pharmacy Consult for Heparin Indication: pulmonary embolus  Allergies  Allergen Reactions   Buspirone     Other Reaction(s): Dizziness   Propofol Anaphylaxis   Zolpidem Other (See Comments)    Hallucinations   Cholestyramine Itching and Rash   Codeine Nausea Only   Hydrochlorothiazide Other (See Comments)    PATIENT DOES NOT REMEMBER   Loratadine Other (See Comments)    PATIENT DOES NOT REMEMBER   Niacin Rash   Patient Measurements: Height: 5\' 4"  (162.6 cm) Weight: 81 kg (178 lb 9.2 oz) IBW/kg (Calculated) : 54.7 Heparin Dosing Weight: 73.7 kg   Vital Signs: Temp: 98.1 F (36.7 C) (12/06 1600) Temp Source: Oral (12/06 1600) BP: 115/66 (12/06 1835) Pulse Rate: 97 (12/06 1835)  Labs: Recent Labs    03/28/23 1038 03/28/23 1441 03/28/23 1736 03/28/23 1736 03/29/23 0202 03/29/23 0954 03/29/23 2002  HGB 14.6  --   --   --  13.1  --   --   HCT 44.9  --   --   --  40.1  --   --   PLT 210  --   --   --  191  --   --   APTT  --   --  32  --   --   --   --   LABPROT  --   --  15.3*  --   --   --   --   INR  --   --  1.2  --   --   --   --   HEPARINUNFRC  --   --  <0.10*   < > 0.66 0.74* 0.62  CREATININE 1.29*  --   --   --  1.05*  --   --   TROPONINIHS 9 9  --   --   --   --   --    < > = values in this interval not displayed.   Estimated Creatinine Clearance: 40.3 mL/min (A) (by C-G formula based on SCr of 1.05 mg/dL (H)).  Medical History: Past Medical History:  Diagnosis Date   Anemia    Anxiety    Aortic stenosis, mild    Arthritis    CKD (chronic kidney disease), stage III (HCC)    Complication of anesthesia    Propofol anaphylaxis   Cortical cataract    DDD (degenerative disc disease), lumbar    Depression    Dyspnea    GERD (gastroesophageal reflux disease)    Headache    Heart murmur    History of 2019 novel coronavirus disease (COVID-19) 12/01/2018   HLD (hyperlipidemia)    Hypertension    Pneumonia     Schatzki's ring    Seasonal allergies    T2DM (type 2 diabetes mellitus) (HCC)    Valvular insufficiency    Medications:  Patient was taking apixaban previously but it was held due to epistaxis - most recently filled in October; per patient she stopped taking in October   Assessment: Samantha Freeman is an 87 year old female that presented with difficulty breathing. PMH is significant for atrial fibrillation not on AC, HFpEF, type 2 diabetes, hypertension, hyperlipidemia, CKD stage IIIa, depression/anxiety presented acute respiratory failure hypoxia, A-fib with RVR, and acute on chronic HFpEF. From chart review, patient was taking apixaban previously but it was held due to epistaxis. CTA of the chest notable for bilateral small to moderate PE. Pharmacy has been consulted for initiation and management  of a heparin infusion. Baseline labs: Hgb 14.6, PLT 210, aPTT and PT/INR ordered.   Goal of Therapy:  Heparin level 0.3 - 0.7  Monitor platelets by anticoagulation protocol: Yes  12/6@0202 : HL=0.66, therapeutic X 1@1250  units/hr 12/6@0954 : HL=0.74, supratherapeutic@1250  units/hr 12/6@2018 : HL=0.62, Therapeutic x 1   Plan:  - continue heparin infusion at 1150 units/hr - check confirmatory HL in am - Monitor CBC and HL daily   Aala Ransom A, PharmD 03/29/2023 8:47 PM

## 2023-03-29 NOTE — Plan of Care (Signed)
Continuing with plan of care. 

## 2023-03-29 NOTE — Consult Note (Signed)
PHARMACY - ANTICOAGULATION CONSULT NOTE  Pharmacy Consult for Heparin Indication: pulmonary embolus  Allergies  Allergen Reactions   Buspirone     Other Reaction(s): Dizziness   Propofol Anaphylaxis   Zolpidem Other (See Comments)    Hallucinations   Cholestyramine Itching and Rash   Codeine Nausea Only   Hydrochlorothiazide Other (See Comments)    PATIENT DOES NOT REMEMBER   Loratadine Other (See Comments)    PATIENT DOES NOT REMEMBER   Niacin Rash   Patient Measurements: Height: 5\' 4"  (162.6 cm) Weight: 81 kg (178 lb 9.2 oz) IBW/kg (Calculated) : 54.7 Heparin Dosing Weight: 73.7 kg   Vital Signs: Temp: 98.1 F (36.7 C) (12/06 0800) Temp Source: Oral (12/06 0800) BP: 120/76 (12/06 1200) Pulse Rate: 88 (12/06 1200)  Labs: Recent Labs    03/28/23 1038 03/28/23 1441 03/28/23 1736 03/29/23 0202 03/29/23 0954  HGB 14.6  --   --  13.1  --   HCT 44.9  --   --  40.1  --   PLT 210  --   --  191  --   APTT  --   --  32  --   --   LABPROT  --   --  15.3*  --   --   INR  --   --  1.2  --   --   HEPARINUNFRC  --   --  <0.10* 0.66 0.74*  CREATININE 1.29*  --   --  1.05*  --   TROPONINIHS 9 9  --   --   --    Estimated Creatinine Clearance: 40.3 mL/min (A) (by C-G formula based on SCr of 1.05 mg/dL (H)).  Medical History: Past Medical History:  Diagnosis Date   Anemia    Anxiety    Aortic stenosis, mild    Arthritis    CKD (chronic kidney disease), stage III (HCC)    Complication of anesthesia    Propofol anaphylaxis   Cortical cataract    DDD (degenerative disc disease), lumbar    Depression    Dyspnea    GERD (gastroesophageal reflux disease)    Headache    Heart murmur    History of 2019 novel coronavirus disease (COVID-19) 12/01/2018   HLD (hyperlipidemia)    Hypertension    Pneumonia    Schatzki's ring    Seasonal allergies    T2DM (type 2 diabetes mellitus) (HCC)    Valvular insufficiency    Medications:  Patient was taking apixaban previously  but it was held due to epistaxis - most recently filled in October; per patient she stopped taking in October   Assessment: Samantha Freeman is an 87 year old female that presented with difficulty breathing. PMH is significant for atrial fibrillation not on AC, HFpEF, type 2 diabetes, hypertension, hyperlipidemia, CKD stage IIIa, depression/anxiety presented acute respiratory failure hypoxia, A-fib with RVR, and acute on chronic HFpEF. From chart review, patient was taking apixaban previously but it was held due to epistaxis. CTA of the chest notable for bilateral small to moderate PE. Pharmacy has been consulted for initiation and management of a heparin infusion. Baseline labs: Hgb 14.6, PLT 210, aPTT and PT/INR ordered.   Goal of Therapy:  Heparin level 0.3 - 0.7  Monitor platelets by anticoagulation protocol: Yes  12/6@0202 : HL=0.66, therapeutic X 1@1250  units/hr 12/6@0954 : HL=0.74, supratherapeutic@1250  units/hr   Plan:  - Decrease heparin infusion to 1150 units/hr - Recheck HL 8 hours after rate change  - Monitor CBC and HL  daily   Bettey Costa, PharmD 03/29/2023 1:03 PM

## 2023-03-29 NOTE — Consult Note (Signed)
PHARMACY - ANTICOAGULATION CONSULT NOTE  Pharmacy Consult for Heparin Indication: pulmonary embolus  Allergies  Allergen Reactions   Buspirone     Other Reaction(s): Dizziness   Propofol Anaphylaxis   Zolpidem Other (See Comments)    Hallucinations   Cholestyramine Itching and Rash   Codeine Nausea Only   Hydrochlorothiazide Other (See Comments)    PATIENT DOES NOT REMEMBER   Loratadine Other (See Comments)    PATIENT DOES NOT REMEMBER   Niacin Rash   Patient Measurements: Height: 5\' 4"  (162.6 cm) Weight: 81 kg (178 lb 9.2 oz) IBW/kg (Calculated) : 54.7 Heparin Dosing Weight: 73.7 kg   Vital Signs: Temp: 98.5 F (36.9 C) (12/06 0200) Temp Source: Oral (12/06 0200) BP: 127/74 (12/06 0200) Pulse Rate: 101 (12/06 0200)  Labs: Recent Labs    03/28/23 1038 03/28/23 1441 03/28/23 1736 03/29/23 0202  HGB 14.6  --   --  13.1  HCT 44.9  --   --  40.1  PLT 210  --   --  191  APTT  --   --  32  --   LABPROT  --   --  15.3*  --   INR  --   --  1.2  --   HEPARINUNFRC  --   --  <0.10* 0.66  CREATININE 1.29*  --   --  1.05*  TROPONINIHS 9 9  --   --    Estimated Creatinine Clearance: 40.3 mL/min (A) (by C-G formula based on SCr of 1.05 mg/dL (H)).  Medical History: Past Medical History:  Diagnosis Date   Anemia    Anxiety    Aortic stenosis, mild    Arthritis    CKD (chronic kidney disease), stage III (HCC)    Complication of anesthesia    Propofol anaphylaxis   Cortical cataract    DDD (degenerative disc disease), lumbar    Depression    Dyspnea    GERD (gastroesophageal reflux disease)    Headache    Heart murmur    History of 2019 novel coronavirus disease (COVID-19) 12/01/2018   HLD (hyperlipidemia)    Hypertension    Pneumonia    Schatzki's ring    Seasonal allergies    T2DM (type 2 diabetes mellitus) (HCC)    Valvular insufficiency    Medications:  Patient was taking apixaban previously but it was held due to epistaxis - most recently filled in  October; per patient she stopped taking in October   Assessment: Samantha Freeman is an 87 year old female that presented with difficulty breathing. PMH is significant for atrial fibrillation not on AC, HFpEF, type 2 diabetes, hypertension, hyperlipidemia, CKD stage IIIa, depression/anxiety presented acute respiratory failure hypoxia, A-fib with RVR, and acute on chronic HFpEF. From chart review, patient was taking apixaban previously but it was held due to epistaxis. CTA of the chest notable for bilateral small to moderate PE. Pharmacy has been consulted for initiation and management of a heparin infusion. Baseline labs: Hgb 14.6, PLT 210, aPTT and PT/INR ordered.   Goal of Therapy:  Heparin level 0.3 - 0.7  Monitor platelets by anticoagulation protocol: Yes   Plan:  12/6:  HL @ 0202 = 0.66, therapeutic X 1  - Will continue pt on current rate and recheck HL on 12/6 @ 1000.  Monitor CBC and HL daily   Braysen Cloward D, PharmD 03/29/2023 3:10 AM

## 2023-03-29 NOTE — Progress Notes (Signed)
Patient transferred to PCU on hospital bed with cardiac monitoring and in stable condition.

## 2023-03-29 NOTE — Consult Note (Signed)
[facility] CLINIC CARDIOLOGY CONSULT NOTE       Patient ID: Samantha Freeman MRN: [id number] DOB/AGE: [birth year] 87 y.o.  Admit date: 03/28/2023 Referring Physician Dr. Doree Albee Primary Physician Sparks, Duane Lope, MD  Primary Cardiologist Dr. Darrold Junker Reason for Consultation AoCHF, AF RVR  HPI: Samantha Freeman is a 87 y.o. female  with a past medical history of  mild aortic stenosis, bradycardia, type 2 diabetes, hypercholesterolemia  who presented to the ED on 03/28/2023 for shortness of breath, fatigue. Cardiology was consulted for further evaluation.   Patient reports that she was discharged from rehab earlier this week and went back home on Tuesday.  States that throughout the week after Tuesday she had worsening shortness of breath.  Also reports that she felt extremely fatigued, had a cough and runny nose.  She was feeling so poorly yesterday that her friend encouraged her to be evaluated so she called EMS and was brought to the ED.  Workup in the ED notable for creatinine 1.29, potassium 4.3, hemoglobin 14.6, WBC 11.3.  BNP elevated at 661.  Troponins negative x 2 at 9 > 9.  Chest x-ray without overt evidence of pulmonary edema.  CTA chest demonstrated few bilateral small pleural effusions.  EKG demonstrated atrial fibrillation RVR with a rate of 118 bpm she was given a dose of IV Lasix and started on IV heparin in the ED as well as IV diltiazem.  She did initially require BiPAP in the ED.  Evaluation this morning patient is resting comfortably in hospital bed.  She states that she feels poorly, still reporting fatigue and SOB which is mildly improved.  She has been weaned to 8 L high flow nasal cannula.  Heart rate overall has improved while on diltiazem infusion.  She did have episode of epistaxis overnight with IV heparin, she has a history of this and is underwent cauterization in the past.  She denies any new or recent episodes of chest pain, palpitations, dizziness/lightheadedness,  syncope.  States that she did not have any lower extremity edema prior to her presentation.  Review of systems complete and found to be negative unless listed above    Past Medical History:  Diagnosis Date   Anemia    Anxiety    Aortic stenosis, mild    Arthritis    CKD (chronic kidney disease), stage III (HCC)    Complication of anesthesia    Propofol anaphylaxis   Cortical cataract    DDD (degenerative disc disease), lumbar    Depression    Dyspnea    GERD (gastroesophageal reflux disease)    Headache    Heart murmur    History of 2019 novel coronavirus disease (COVID-19) 12/01/2018   HLD (hyperlipidemia)    Hypertension    Pneumonia    Schatzki's ring    Seasonal allergies    T2DM (type 2 diabetes mellitus) (HCC)    Valvular insufficiency     Past Surgical History:  Procedure Laterality Date   ABDOMINAL HYSTERECTOMY     APPENDECTOMY     BICEPT TENODESIS Right 05/13/2018   Procedure: BICEPS TENODESIS;  Surgeon: Christena Flake, MD;  Location: [facility] ORS;  Service: Orthopedics;  Laterality: Right;   CARDIAC CATHETERIZATION     COLONOSCOPY     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   EYE SURGERY Right 09/2013   Corneal transplant   JOINT REPLACEMENT Right 2015   knee   REVERSE SHOULDER ARTHROPLASTY Left 10/13/2020   Procedure: REVERSE SHOULDER ARTHROPLASTY;  Surgeon: Christena Flake, MD;  Location: [facility] ORS;  Service: Orthopedics;  Laterality: Left;   SHOULDER ARTHROSCOPY WITH ROTATOR CUFF REPAIR Right 05/13/2018   Procedure: SHOULDER ARTHROSCOPY WITH DEBRIDEMENT, DECOMPRESSION AND ROTATOR CUFF REPAIR;  Surgeon: Christena Flake, MD;  Location: [facility] ORS;  Service: Orthopedics;  Laterality: Right;   TONSILLECTOMY      Medications Prior to Admission  Medication Sig Dispense Refill Last Dose   ALPRAZolam (XANAX) 0.5 MG tablet Take 0.5 mg by mouth at bedtime as needed for anxiety or sleep.   03/27/2023   aspirin EC 81 MG tablet Take 1 tablet (81 mg total) by mouth daily.  Swallow whole. 30 tablet 0 03/27/2023   Calcium Carb-Cholecalciferol (CALCIUM 600/VITAMIN D3 PO) Take 1 tablet by mouth daily.   03/27/2023   Coenzyme Q10 10 MG capsule Take 10 mg by mouth every morning.   03/27/2023   dapagliflozin propanediol (FARXIGA) 10 MG TABS tablet Take 10 mg by mouth daily.   03/27/2023   digoxin (LANOXIN) 0.125 MG tablet Take 1 tablet (0.125 mg total) by mouth every other day. 15 tablet 0 03/27/2023   diltiazem (CARDIZEM CD) 180 MG 24 hr capsule Take 1 capsule (180 mg total) by mouth daily. 30 capsule 0 03/27/2023   fenofibrate (TRICOR) 48 MG tablet Take 1 tablet by mouth daily.   03/27/2023   furosemide (LASIX) 20 MG tablet Take 20 mg by mouth daily as needed for fluid or edema.   prn at unknown   gabapentin (NEURONTIN) 100 MG capsule Take 200 mg by mouth 3 (three) times daily.   03/27/2023   hydrOXYzine (ATARAX) 25 MG tablet Take 1 tablet by mouth 2 (two) times daily.   03/27/2023   insulin lispro (HUMALOG) 100 UNIT/ML injection Inject 7-19 Units into the skin 3 (three) times daily before meals. Blood Glucose level: 140-199 - 7 units, 200-250 - 9 units, 251-299 - 13 units,  300-349 - 17 units,  350 or above 19 units.   03/27/2023   LANTUS SOLOSTAR 100 UNIT/ML Solostar Pen Inject 36 Units into the skin at bedtime.   03/27/2023   magnesium oxide (MAG-OX) 400 MG tablet Take 800 mg by mouth 3 (three) times daily.   03/27/2023   metoprolol tartrate (LOPRESSOR) 50 MG tablet Take 1 tablet (50 mg total) by mouth 2 (two) times daily. 60 tablet 0 03/27/2023   Multiple Vitamin (MULTIVITAMIN) tablet Take 1 tablet by mouth daily. Women's Daily Multivitamin   03/27/2023   pantoprazole (PROTONIX) 40 MG tablet Take 1 tablet (40 mg total) by mouth daily.   03/27/2023   PARoxetine (PAXIL) 10 MG tablet Take 10 mg by mouth daily.   03/27/2023   prednisoLONE acetate (PRED FORTE) 1 % ophthalmic suspension Place 1 drop into both eyes at bedtime.   03/27/2023   rOPINIRole (REQUIP) 1 MG tablet Take 1 mg by mouth  at bedtime.   03/27/2023   rosuvastatin (CRESTOR) 10 MG tablet Take 10 mg by mouth daily.   03/27/2023   vitamin B-12 (CYANOCOBALAMIN) 1000 MCG tablet Take 1,000 mcg by mouth daily.   03/27/2023   blood glucose meter kit and supplies KIT Dispense based on patient and insurance preference. Use up to four times daily as directed. (FOR ICD-9 250.00, 250.01). For QAC - HS accuchecks. 1 each 1    ferrous sulfate 325 (65 FE) MG tablet Take 325 mg by mouth daily with breakfast. (Patient not taking: Reported on 02/25/2023)      glucose blood (FREESTYLE LITE) test  strip For glucose testing every before meals at bedtime. Diagnosis E 11.65  Can substitute to any accepted brand 100 each 0    Insulin Syringe-Needle U-100 25G X 1" 1 ML MISC For 4 times a day insulin SQ, 1 month supply. Diagnosis E11.65 30 each 0    traZODone (DESYREL) 50 MG tablet Take 50 mg by mouth at bedtime. (Patient not taking: Reported on 03/28/2023)   Not Taking   Social History   Socioeconomic History   Marital status: Widowed    Spouse name: Not on file   Number of children: Not on file   Years of education: Not on file   Highest education level: Not on file  Occupational History   Not on file  Tobacco Use   Smoking status: Never   Smokeless tobacco: Never  Vaping Use   Vaping status: Never Used  Substance and Sexual Activity   Alcohol use: Never   Drug use: Never   Sexual activity: Not Currently  Other Topics Concern   Not on file  Social History Narrative   Lives alone, has support from son who is a Optician, dispensing and daughter in Social worker. Will be with mother when she has surgery.   Social Determinants of Health   Financial Resource Strain: Not on file  Food Insecurity: No Food Insecurity (03/28/2023)   Hunger Vital Sign    Worried About Running Out of Food in the Last Year: Never true    Ran Out of Food in the Last Year: Never true  Transportation Needs: No Transportation Needs (03/28/2023)   PRAPARE - Doctor, general practice (Medical): No    Lack of Transportation (Non-Medical): No  Physical Activity: Not on file  Stress: Not on file  Social Connections: Not on file  Intimate Partner Violence: Not At Risk (03/28/2023)   Humiliation, Afraid, Rape, and Kick questionnaire    Fear of Current or Ex-Partner: No    Emotionally Abused: No    Physically Abused: No    Sexually Abused: No    Family History  Problem Relation Age of Onset   Breast cancer Paternal Aunt      Vitals:   03/29/23 0600 03/29/23 0700 03/29/23 0732 03/29/23 0800  BP: 115/79 119/72  126/71  Pulse: 90 77 (!) 109 (!) 127  Resp: (!) 22 (!) 21 20 (!) 23  Temp:    98.1 F (36.7 C)  TempSrc:    Oral  SpO2: 93% 93% 92% 91%  Weight:      Height:        PHYSICAL EXAM General: Ill appearing elderly female, well nourished, in no acute distress. HEENT: Normocephalic and atraumatic. Neck: No JVD.  Lungs: Normal respiratory effort on 8L HFNC. Clear bilaterally to auscultation. No wheezes, crackles, rhonchi.  Heart: Irregularly irregular. Normal S1 and S2 without gallops or murmurs.  Abdomen: Non-distended appearing.  Msk: Normal strength and tone for age. Extremities: Warm and well perfused. No clubbing, cyanosis. No edema.  Neuro: Alert and oriented X 3. Psych: Answers questions appropriately.   Labs: Basic Metabolic Panel: Recent Labs    03/28/23 1038 03/28/23 2241 03/29/23 0202  NA 137  --  135  K 4.3  --  3.7  CL 95*  --  95*  CO2 30  --  27  GLUCOSE 166*  --  163*  BUN 29*  --  26*  CREATININE 1.29*  --  1.05*  CALCIUM 9.2  --  8.6*  MG  --  1.8  --    Liver Function Tests: Recent Labs    03/29/23 0202  AST 16  ALT 13  ALKPHOS 46  BILITOT 1.2*  PROT 6.7  ALBUMIN 3.5   No results for input(s): "LIPASE", "AMYLASE" in the last 72 hours. CBC: Recent Labs    03/28/23 1038 03/29/23 0202  WBC 11.3* 10.9*  NEUTROABS 8.2*  --   HGB 14.6 13.1  HCT 44.9 40.1  MCV 91.4 91.6  PLT 210 191   Cardiac  Enzymes: Recent Labs    03/28/23 1038 03/28/23 1441  TROPONINIHS 9 9   BNP: Recent Labs    03/28/23 1026  BNP 661.2*   D-Dimer: No results for input(s): "DDIMER" in the last 72 hours. Hemoglobin A1C: No results for input(s): "HGBA1C" in the last 72 hours. Fasting Lipid Panel: No results for input(s): "CHOL", "HDL", "LDLCALC", "TRIG", "CHOLHDL", "LDLDIRECT" in the last 72 hours. Thyroid Function Tests: No results for input(s): "TSH", "T4TOTAL", "T3FREE", "THYROIDAB" in the last 72 hours.  Invalid input(s): "FREET3" Anemia Panel: No results for input(s): "VITAMINB12", "FOLATE", "FERRITIN", "TIBC", "IRON", "RETICCTPCT" in the last 72 hours.   Radiology: CT Angio Chest PE W and/or Wo Contrast  Result Date: 03/28/2023 CLINICAL DATA:  Shortness of breath EXAM: CT ANGIOGRAPHY CHEST WITH CONTRAST TECHNIQUE: Multidetector CT imaging of the chest was performed using the standard protocol during bolus administration of intravenous contrast. Multiplanar CT image reconstructions and MIPs were obtained to evaluate the vascular anatomy. RADIATION DOSE REDUCTION: This exam was performed according to the departmental dose-optimization program which includes automated exposure control, adjustment of the mA and/or kV according to patient size and/or use of iterative reconstruction technique. CONTRAST:  75mL OMNIPAQUE IOHEXOL 350 MG/ML SOLN COMPARISON:  CT angiogram 02/25/2023.  X-ray 03/28/2023 and older. FINDINGS: Cardiovascular: Heart is enlarged. Trace pericardial fluid. Coronary artery calcifications are seen. Minimal contrast opacification along the thoracic aorta. Grossly normal course and caliber with vascular calcifications. There is some dilatation of the main pulmonary artery. Please correlate for pulmonary artery hypertension. There is a pulmonary embolism identified in the upper aspect of the middle lobe as seen on series 6, image 213. This has not seen on the previous examination. There is also  embolus along the lingula on series 6, image 180, not seen previously. Overall mild clot burden. No larger or more central embolus. Some limited evaluation related to motion. Mediastinum/Nodes: Patulous esophagus. Heterogeneous thyroid gland. There are several prominent nodes identified in the mediastinum and hilum bilaterally, similar to previous. No axillary nodes. Lungs/Pleura: Trace pleural fluid with the adjacent parenchymal opacities. The opacities are increasing from previous at the lower lobes. There are also some dependent areas in the upper lobes. There is some new nodules identified as well in the left upper lobe measuring 10 mm on series 5, image 38. With the time course favor a infectious or inflammatory process as this has not seen previously. There are areas of bronchial wall thickening, mucous plugging and interstitial septal thickening. Few areas of ground-glass as well. Upper Abdomen: The adrenal glands are preserved in the upper abdomen. Stomach is relatively collapsed punctate nonobstructing right-sided renal stone at the edge of the imaging field. Musculoskeletal: Surgical changes about the left shoulder. Scattered degenerative changes along the spine. Review of the MIP images confirms the above findings. Critical Value/emergent results were called by telephone at the time of interpretation on 03/28/2023 at 4:21 pm EST to provider Dr. Alvester Morin, who verbally acknowledged these results. IMPRESSION: New bilateral few segmental and  smaller pulmonary emboli. Mild clot burden. No larger or more central embolus. There is some enlargement of the main pulmonary arteries. Please correlate for pulmonary artery hypertension. This has seen previously. Enlarged heart. Increasing parenchymal bilateral lung opacities identified with some subtle nodularity, ground-glass and interstitial septal thickening. Few presumed reactive nodes. Recommend follow up to confirm resolution. Persistent tiny pleural effusions,  left-greater-than-right. Aortic Atherosclerosis (ICD10-I70.0). Electronically Signed   By: Karen Kays M.D.   On: 03/28/2023 16:42   CT HEAD WO CONTRAST ( )  Result Date: 03/28/2023 CLINICAL DATA:  Mental status change, unknown cause. EXAM: CT HEAD WITHOUT CONTRAST TECHNIQUE: Contiguous axial images were obtained from the base of the skull through the vertex without intravenous contrast. RADIATION DOSE REDUCTION: This exam was performed according to the departmental dose-optimization program which includes automated exposure control, adjustment of the mA and/or kV according to patient size and/or use of iterative reconstruction technique. COMPARISON:  None Available. FINDINGS: Brain: There is no evidence of an acute infarct, intracranial hemorrhage, mass, midline shift, or extra-axial fluid collection. Mild cerebral atrophy is within normal limits for age. Hypodensities in the cerebral white matter nonspecific but compatible with mild chronic small vessel ischemic disease. Vascular: Calcified atherosclerosis at the skull base. No hyperdense vessel. Skull: No acute fracture or suspicious osseous lesion. Sinuses/Orbits: Chronic left sphenoid sinusitis with near complete opacification. Mild mucosal thickening in the paranasal sinuses elsewhere. Clear mastoid air cells. Bilateral cataract extraction. Other: None. IMPRESSION: 1. No evidence of acute intracranial abnormality. 2. Mild chronic small vessel ischemic disease. Electronically Signed   By: Sebastian Ache M.D.   On: 03/28/2023 15:52   DG Chest Portable 1 View  Result Date: 03/28/2023 CLINICAL DATA:  Shortness of breath EXAM: PORTABLE CHEST - 1 VIEW COMPARISON:  02/25/2023 FINDINGS: Unchanged borderline cardiomegaly. No significant pulmonary vascular congestion. Unchanged mild left basilar opacity favored to be atelectasis. Reversed left total shoulder prosthesis again seen. IMPRESSION: 1. Unchanged mild cardiomegaly. 2. Unchanged left basilar opacity  likely due to atelectasis. Electronically Signed   By: Acquanetta Belling M.D.   On: 03/28/2023 11:42    ECHO 01/2023: 1. Left ventricular ejection fraction, by estimation, is 60 to 65%. The left ventricle has normal function. The left ventricle has no regional wall motion abnormalities. There is moderate left ventricular hypertrophy. Indeterminate diastolic filling due  to E-A fusion.   2. Right ventricular systolic function is normal. The right ventricular size is normal.   3. Left atrial size was moderately dilated.   4. Right atrial size was mildly dilated.   5. The mitral valve is normal in structure. Mild mitral valve  regurgitation. No evidence of mitral stenosis.   6. The aortic valve is normal in structure. Aortic valve regurgitation is  not visualized. Mild aortic valve stenosis.   7. The inferior vena cava is normal in size with greater than 50%  respiratory variability, suggesting right atrial pressure of 3 mmHg.   TELEMETRY reviewed by me 03/29/2023: atrial fibrillation rate 90s  EKG reviewed by me: atrial fibrillation RVR rate 118 bom  Data reviewed by me 03/29/2023: last 24h vitals tele labs imaging I/O ED provider note, admission H&P  Principal Problem:   Acute respiratory failure with hypoxia (HCC) Active Problems:   Chronic kidney disease, stage 3a (HCC)   DM (diabetes mellitus) (HCC)   HTN (hypertension)   Acute hypoxic respiratory failure (HCC)   Atrial fibrillation with RVR (HCC)   GERD (gastroesophageal reflux disease)   Acute heart failure with preserved  ejection fraction (HFpEF) (HCC)   Encephalopathy    ASSESSMENT AND PLAN:  Samantha Freeman is a 87 y.o. female  with a past medical history of  mild aortic stenosis, bradycardia, type 2 diabetes, hypercholesterolemia  who presented to the ED on 03/28/2023 for shortness of breath, fatigue. Cardiology was consulted for further evaluation.   # Acute hypoxic respiratory failure # Acute on chronic HFpEF # Acute pulmonary  emboli Patient with worsening SOB and fatigue over the last few days, brought to ED yesterday. Initially required BiPAP due to hypoxia now on 8L HFNC. BNP elevated at 661. Troponins negative x2 at 9 > 9. CXR without significant pulmonary edema. CTA chest with few bilateral segmental and small PE, mild clot burden. -Continue IV heparin. Patient with nosebleeds on DOAC before and now with heparin, has undergone cauterization in the past. Given she will need long term anticoagulation if she agrees (has declined in the past) would consider ENT evaluation.  -Will give additional dose of IV lasix 20 mg today. Suspect SOB more related to acute PE. Has diuresed well so far.  -Further management of PE per primary.   # Persistent atrial fibrillation # Atrial fibrillation with rapid ventricular response Patient with hx of AF, multiple recent hospitalizations with difficult to control rate. Most recently was placed on metoprolol, diltiazem, and digoxin for rate control. Dig level reported at 2.6 but this was corrected to 0.2 per lab.  -Consider restarting digoxin.  -Continue diltiazem infusion. Continue metoprolol 50 mg twice daily.  -Continue heparin, would benefit from DOAC which has been discussed in detail with patient in the past but she has declined.  # Hyperlipidemia -Continue rosuvastatin 10 mg daily and aspirin 81 mg daily.    This patient's plan of care was discussed and created with Dr. Melton Alar and she is in agreement.  Signed: Gale Journey, PA-C  03/29/2023, 9:56 AM [facility]

## 2023-03-29 NOTE — Progress Notes (Signed)
       CROSS COVER NOTE  NAME: Samantha Freeman MRN: [id number] DOB : 09-Aug-1935    Concern as stated by nurse / staff   Minor nose bleed, know complication of anticoagulants in patient managed with afrin nasal spray per patient report     Pertinent findings on chart review:   Assessment and  Interventions   Assessment:  Plan: Afrin nasal spray BID prn nose bleed       Donnie Mesa NP [facility] Cross Cover 7pm-7am - check amion for availability Pager [phone]

## 2023-03-30 DIAGNOSIS — J9601 Acute respiratory failure with hypoxia: Secondary | ICD-10-CM | POA: Diagnosis not present

## 2023-03-30 LAB — GLUCOSE, CAPILLARY
Glucose-Capillary: 221 mg/dL — ABNORMAL HIGH (ref 70–99)
Glucose-Capillary: 198 mg/dL — ABNORMAL HIGH (ref 70–99)
Glucose-Capillary: 333 mg/dL — ABNORMAL HIGH (ref 70–99)
Glucose-Capillary: 288 mg/dL — ABNORMAL HIGH (ref 70–99)
Glucose-Capillary: 394 mg/dL — ABNORMAL HIGH (ref 70–99)

## 2023-03-30 LAB — BASIC METABOLIC PANEL
Anion gap: 10 (ref 5–15)
BUN: 24 mg/dL — ABNORMAL HIGH (ref 8–23)
CO2: 28 mmol/L (ref 22–32)
Calcium: 9 mg/dL (ref 8.9–10.3)
Chloride: 96 mmol/L — ABNORMAL LOW (ref 98–111)
Creatinine, Ser: 1.12 mg/dL — ABNORMAL HIGH (ref 0.44–1.00)
GFR, Estimated: 48 mL/min — ABNORMAL LOW (ref >60)
Glucose, Bld: 204 mg/dL — ABNORMAL HIGH (ref 70–99)
Potassium: 3.7 mmol/L (ref 3.5–5.1)
Sodium: 134 mmol/L — ABNORMAL LOW (ref 135–145)

## 2023-03-30 LAB — MAGNESIUM: Magnesium: 1.8 mg/dL (ref 1.7–2.4)

## 2023-03-30 LAB — CBC
HCT: 42.1 % (ref 36.0–46.0)
Hemoglobin: 13.8 g/dL (ref 12.0–15.0)
MCH: 29.4 pg (ref 26.0–34.0)
MCHC: 32.8 g/dL (ref 30.0–36.0)
MCV: 89.6 fL (ref 80.0–100.0)
Platelets: 219 10*3/uL (ref 150–400)
RBC: 4.7 MIL/uL (ref 3.87–5.11)
RDW: 15.2 % (ref 11.5–15.5)
WBC: 9.4 10*3/uL (ref 4.0–10.5)
nRBC: 0 % (ref 0.0–0.2)

## 2023-03-30 LAB — HEPARIN LEVEL (UNFRACTIONATED): Heparin Unfractionated: 0.58 [IU]/mL (ref 0.30–0.70)

## 2023-03-30 MED ORDER — INSULIN ASPART 100 UNIT/ML IJ SOLN
0.0000 [IU] | Freq: Three times a day (TID) | INTRAMUSCULAR | Status: DC
  Administered 2023-03-31: 7 [IU] via SUBCUTANEOUS
  Administered 2023-03-31: 9 [IU] via SUBCUTANEOUS
  Administered 2023-03-31 – 2023-04-01 (×2): 7 [IU] via SUBCUTANEOUS
  Administered 2023-04-01: 5 [IU] via SUBCUTANEOUS
  Administered 2023-04-01: 7 [IU] via SUBCUTANEOUS
  Administered 2023-04-02: 5 [IU] via SUBCUTANEOUS
  Administered 2023-04-02: 3 [IU] via SUBCUTANEOUS
  Administered 2023-04-03: 9 [IU] via SUBCUTANEOUS
  Administered 2023-04-03: 5 [IU] via SUBCUTANEOUS
  Filled 2023-03-30 (×10): qty 1

## 2023-03-30 MED ORDER — GABAPENTIN 100 MG PO CAPS
200.0000 mg | ORAL_CAPSULE | Freq: Three times a day (TID) | ORAL | Status: DC
  Administered 2023-03-30 – 2023-04-08 (×26): 200 mg via ORAL
  Filled 2023-03-30 (×26): qty 2

## 2023-03-30 MED ORDER — ENSURE ENLIVE PO LIQD
237.0000 mL | Freq: Three times a day (TID) | ORAL | Status: DC
  Administered 2023-03-30 – 2023-03-31 (×3): 237 mL via ORAL

## 2023-03-30 MED ORDER — PAROXETINE HCL 10 MG PO TABS
10.0000 mg | ORAL_TABLET | Freq: Every day | ORAL | Status: DC
  Administered 2023-03-31 – 2023-04-08 (×9): 10 mg via ORAL
  Filled 2023-03-30 (×9): qty 1

## 2023-03-30 MED ORDER — ROPINIROLE HCL 1 MG PO TABS
1.0000 mg | ORAL_TABLET | Freq: Every day | ORAL | Status: DC
  Administered 2023-03-30: 1 mg via ORAL
  Filled 2023-03-30: qty 1

## 2023-03-30 MED ORDER — DILTIAZEM HCL 30 MG PO TABS
60.0000 mg | ORAL_TABLET | Freq: Three times a day (TID) | ORAL | Status: DC
  Administered 2023-03-30 – 2023-03-31 (×2): 60 mg via ORAL
  Filled 2023-03-30 (×2): qty 2

## 2023-03-30 NOTE — Progress Notes (Signed)
PROGRESS NOTE    Samantha Freeman  FIE:[id number] DOB: Aug 23, 1935 DOA: 03/28/2023 PCP: Marguarite Arbour, MD  [id number]  LOS: 2 days   Brief hospital course:   Assessment & Plan: Samantha Freeman is a 87 y.o. female with medical history significant of atrial fibrillation not on AC, HFpEF, type 2 diabetes, hypertension, hyperlipidemia, CKD stage IIIa, depression/anxiety presented acute respiratory failure hypoxia, A-fib with RVR, acute on chronic HFpEF.  Patient reports increased work of breathing over the past 1 to 2 days.    Atrial fibrillation with RVR (HCC) --3 hospitalizations in 2 months for the same.  Most recently was placed on metoprolol, diltiazem, and digoxin for rate control.  Not on anticoagulation due to hx/o epistaxis.   --started on dilt gtt, [facility] cardiology consulted. Plan: --cont dilt gtt  --start oral dilt 60 q8h --cont metop 50 BID --cont heparin gtt  Acute hypoxic respiratory failure (HCC) Decompensated respiratory failure requiring initially BiPAP and now transition to high flow nasal cannula 8L. --in the setting A-fib with RVR and acute on chronic HFpEF and PE. Plan: --treat underlying causes --Continue supplemental O2 to keep sats >=92%, wean as tolerated  Acute PE --New bilateral few segmental and smaller pulmonary emboli.  --cont heparin gtt  Acute heart failure with preserved ejection fraction (HFpEF) (HCC) 2D echo November 2024 with EF of 60 to 65% BNP 600s with positive cardiomegaly on chest x-ray 1-2+ pitting edema bilaterally --s/p IV lasix 40 x1 in ED and IV lasix 20 x1 per cardio Plan: --diuresis per cardio  Recurrent epistaxis --in response to anticoagulation.  Per ENT, has been "packed cauterized 3x by [place] in last 2 months." --ENT consulted, pt repeated stressed "she would rather die than have nasal packing."   Plan: --If bleeds, recommend pressure with clamp, Afrin, humidification.  --vascular surgery to consider embolization on  Monday  Encephalopathy Reported patient with transient episode of hallucination Appears to be alert at the bedside Will monitor for now CT head pending Add on VBG in the setting of hypoxia As needed Zyprexa for agitation Monitor  GERD (gastroesophageal reflux disease) PPI    HTN (hypertension) --on rate control agents  DM (diabetes mellitus) (HCC) --cont glargine 10u nightly --ACHS and SSI   Chronic kidney disease, stage 3a (HCC) Cr 1.3 w/ GFR in 40s  Looks to be near baseline  Monitor    DVT prophylaxis: ZY:[id number] gtt Code Status: DNR  Family Communication:  Level of care: Progressive Dispo:   The patient is from: home Anticipated d/c is to: home Anticipated d/c date is: to be determined    Subjective and Interval History:  Pt reported no dyspnea, the most bothersome for pt is her stuffed up nose.   Objective: Vitals:   03/30/23 0839 03/30/23 1214 03/30/23 1602 03/30/23 1810  BP:  117/70 120/75   Pulse:  71 72 95  Resp:  14 16   Temp:  98.1 F (36.7 C) 98 F (36.7 C)   TempSrc:      SpO2: 93%   93%  Weight:      Height:        Intake/Output Summary (Last 24 hours) at 03/30/2023 1811 Last data filed at 03/30/2023 1742 Gross per 24 hour  Intake 282.01 ml  Output 1025 ml  Net -742.99 ml   Filed Weights   03/28/23 1029 03/28/23 2145  Weight: 86.1 kg 81 kg    Examination:   Constitutional: NAD, AAOx3 HEENT: conjunctivae and lids normal, EOMI CV: No cyanosis.  RESP: normal respiratory effort, on 8L Neuro: II - XII grossly intact.   Psych: Normal mood and affect.  Appropriate judgement and reason   Data Reviewed: I have personally reviewed labs and imaging studies  Time spent: 50 minutes  Darlin Priestly, MD [facility] If 7PM-7AM, please contact night-coverage 03/30/2023, 6:11 PM

## 2023-03-30 NOTE — Consult Note (Signed)
PHARMACY - ANTICOAGULATION CONSULT NOTE  Pharmacy Consult for Heparin Indication: pulmonary embolus  Allergies  Allergen Reactions   Buspirone     Other Reaction(s): Dizziness   Propofol Anaphylaxis   Zolpidem Other (See Comments)    Hallucinations   Cholestyramine Itching and Rash   Codeine Nausea Only   Hydrochlorothiazide Other (See Comments)    PATIENT DOES NOT REMEMBER   Loratadine Other (See Comments)    PATIENT DOES NOT REMEMBER   Niacin Rash   Patient Measurements: Height: 5\' 4"  (162.6 cm) Weight: 81 kg (178 lb 9.2 oz) IBW/kg (Calculated) : 54.7 Heparin Dosing Weight: 73.7 kg   Vital Signs: Temp: 97.8 F (36.6 C) (12/07 0450) Temp Source: Oral (12/07 0450) BP: 115/69 (12/07 0450) Pulse Rate: 57 (12/07 0450)  Labs: Recent Labs    03/28/23 1038 03/28/23 1441 03/28/23 1736 03/28/23 1736 03/29/23 0202 03/29/23 0954 03/29/23 2002 03/30/23 0637  HGB 14.6  --   --   --  13.1  --   --  13.8  HCT 44.9  --   --   --  40.1  --   --  42.1  PLT 210  --   --   --  191  --   --  219  APTT  --   --  32  --   --   --   --   --   LABPROT  --   --  15.3*  --   --   --   --   --   INR  --   --  1.2  --   --   --   --   --   HEPARINUNFRC  --   --  <0.10*   < > 0.66 0.74* 0.62 0.58  CREATININE 1.29*  --   --   --  1.05*  --   --   --   TROPONINIHS 9 9  --   --   --   --   --   --    < > = values in this interval not displayed.   Estimated Creatinine Clearance: 40.3 mL/min (A) (by C-G formula based on SCr of 1.05 mg/dL (H)).  Medical History: Past Medical History:  Diagnosis Date   Anemia    Anxiety    Aortic stenosis, mild    Arthritis    CKD (chronic kidney disease), stage III (HCC)    Complication of anesthesia    Propofol anaphylaxis   Cortical cataract    DDD (degenerative disc disease), lumbar    Depression    Dyspnea    GERD (gastroesophageal reflux disease)    Headache    Heart murmur    History of 2019 novel coronavirus disease (COVID-19) 12/01/2018    HLD (hyperlipidemia)    Hypertension    Pneumonia    Schatzki's ring    Seasonal allergies    T2DM (type 2 diabetes mellitus) (HCC)    Valvular insufficiency    Medications:  Patient was taking apixaban previously but it was held due to epistaxis - most recently filled in October; per patient she stopped taking in October   Assessment: Samantha Freeman is an 87 year old female that presented with difficulty breathing. PMH is significant for atrial fibrillation not on AC, HFpEF, type 2 diabetes, hypertension, hyperlipidemia, CKD stage IIIa, depression/anxiety presented acute respiratory failure hypoxia, A-fib with RVR, and acute on chronic HFpEF. From chart review, patient was taking apixaban previously but it was held due  to epistaxis. CTA of the chest notable for bilateral small to moderate PE. Pharmacy has been consulted for initiation and management of a heparin infusion. Baseline labs: Hgb 14.6, PLT 210, aPTT and PT/INR ordered.   Goal of Therapy:  Heparin level 0.3 - 0.7  Monitor platelets by anticoagulation protocol: Yes  12/6@0202 : HL=0.66, therapeutic X 1@1250  units/hr 12/6@0954 : HL=0.74, supratherapeutic@1250  units/hr 12/6@2018 : HL=0.62, Therapeutic x 1 12/7@0637 : HL=0.58, Therapeutic X 2    Plan:  - continue heparin infusion at 1150 units/hr - recheck HL on 12/8 with AM labs - Monitor CBC and HL daily   Doshie Maggi D, PharmD 03/30/2023 7:14 AM

## 2023-03-30 NOTE — Evaluation (Signed)
Occupational Therapy Evaluation Patient Details Name: Samantha Freeman MRN: [id number] DOB: May 28, 1935 Today's Date: 03/30/2023   History of Present Illness Pt is a 87 y.o. female admitted with AFIB with RVR, acute hypoxic respiratory failure, acute heart failure, & encephalopathy. PMH significant for atrial fibrillation not on AC, HFpEF, type 2 diabetes, hypertension, hyperlipidemia, CKD stage IIIa   Clinical Impression   Prior to hospital admission, pt was independent and living alone but notes that she has been unable to take care of herself recently. Pt has family nearby, but limited options for 24/7 assist upon hospital discharge. Pt currently requires minA for functional transfers, setup for UB ADLs, maxA-minA for LB ADLs (max for posterior pericare), and is limited by decreased activity tolerance. Pt does not use oxygen at home, and is currently on 9L HFNC. Pt would benefit from skilled OT services to address noted impairments and functional limitations (see below for any additional details) in order to maximize safety and independence while minimizing falls risk and caregiver burden. Patient will benefit from continued inpatient follow up therapy, <3 hours/day.       If plan is discharge home, recommend the following: A little help with walking and/or transfers;A little help with bathing/dressing/bathroom;Assistance with cooking/housework;Assist for transportation;Help with stairs or ramp for entrance    Functional Status Assessment  Patient has had a recent decline in their functional status and demonstrates the ability to make significant improvements in function in a reasonable and predictable amount of time.  Equipment Recommendations  Other (comment)       Precautions / Restrictions Precautions Precautions: Fall Precaution Comments: watch HR and O2 Restrictions Weight Bearing Restrictions: No      Mobility Bed Mobility Overal bed mobility: Needs Assistance Bed Mobility:  Supine to Sit     Supine to sit: Min assist, +2 for physical assistance, +2 for safety/equipment     General bed mobility comments: bed mobility +2 for inital attempt    Transfers Overall transfer level: Needs assistance Equipment used: Rolling walker (2 wheels) Transfers: Sit to/from Stand, Bed to chair/wheelchair/BSC Sit to Stand: Contact guard assist, Min assist, +2 safety/equipment, From elevated surface     Step pivot transfers: Contact guard assist, +2 safety/equipment     General transfer comment: first transfer required additonal vcs and cues for hand placement, second transfer to [facility] improved with CGA and better control of descent      Balance Overall balance assessment: Needs assistance Sitting-balance support: Feet supported Sitting balance-Leahy Scale: Good Sitting balance - Comments: sits EOB to don socks no LOB   Standing balance support: During functional activity, Bilateral upper extremity supported Standing balance-Leahy Scale: Fair                             ADL either performed or assessed with clinical judgement   ADL Overall ADL's : Needs assistance/impaired Eating/Feeding: Sitting;Set up   Grooming: Sitting;Set up   Upper Body Bathing: Sitting;Set up   Lower Body Bathing: Sit to/from stand;Contact guard assist   Upper Body Dressing : Set up;Sitting   Lower Body Dressing: Sit to/from stand;Contact guard assist Lower Body Dressing Details (indicate cue type and reason): dons socks using figure four Toilet Transfer: Contact guard assist;Minimal assistance;+2 for safety/equipment;Cueing for safety;Cueing for sequencing;Ambulation;Rolling walker (2 wheels) Toilet Transfer Details (indicate cue type and reason): cues for hand placement, +2 for line mgmt, steady in balance but decreased activity tolerance Toileting- Clothing Manipulation and Hygiene: +2 for  safety/equipment;Sit to/from stand;Minimal assistance;Maximal assistance Toileting  - Clothing Manipulation Details (indicate cue type and reason): standing with RW, max for posterior pericare, pt able to perform anterior with CGA     Functional mobility during ADLs: +2 for safety/equipment;Minimal assistance;Contact guard assist;Rolling walker (2 wheels);Cueing for sequencing General ADL Comments: Pt found in bed with pure wick failure (unaware that bed soaked in urine), +2 for line/lead mgmt. Limitations to safe, efficient ADL performance are decreased activity tolerance, balance and generalized weakness      Pertinent Vitals/Pain Pain Assessment Pain Assessment: No/denies pain     Extremity/Trunk Assessment Upper Extremity Assessment Upper Extremity Assessment: Right hand dominant;Generalized weakness   Lower Extremity Assessment Lower Extremity Assessment: Generalized weakness       Communication Communication Communication: No apparent difficulties   Cognition Arousal: Alert Behavior During Therapy: WFL for tasks assessed/performed Overall Cognitive Status: Within Functional Limits for tasks assessed                                 General Comments: Pleasant and motivated to work with OT     General Comments  On 9L HFNC, SpO2 91-98% throughout. HR 99-105 with one increase to 117            Home Living Family/patient expects to be discharged to:: Private residence Living Arrangements: Alone Available Help at Discharge: Family;Available PRN/intermittently Type of Home: House Home Access: Level entry     Home Layout: One level     Bathroom Shower/Tub: Producer, television/film/video: Handicapped height Bathroom Accessibility: Yes   Home Equipment: Cane - single point;Standard Environmental consultant;Shower seat - built in;Grab bars - tub/shower;Hand held Stage manager (4 wheels)          Prior Functioning/Environment Prior Level of Function : Independent/Modified Independent;Driving             Mobility Comments: Pt reports  using a 4W RW at baseline. Pt reports that she hasn't been going out into the community much d/t to fatigue. Pt reports son and his wife help her with groceries. Pt still drives but hasn't done much because of how she has been feeling. ADLs Comments: mod I, uses weekly pill box for medications, light meal prep.        OT Problem List: Decreased activity tolerance;Impaired balance (sitting and/or standing);Decreased knowledge of use of DME or AE;Decreased safety awareness;Cardiopulmonary status limiting activity;Decreased strength;Decreased coordination      OT Treatment/Interventions: Self-care/ADL training;Therapeutic exercise;Therapeutic activities;DME and/or AE instruction;Energy conservation;Patient/family education;Balance training    OT Goals(Current goals can be found in the care plan section) Acute Rehab OT Goals OT Goal Formulation: With patient Time For Goal Achievement: 04/13/23 Potential to Achieve Goals: Good  OT Frequency: Min 1X/week    Co-evaluation PT/OT/SLP Co-Evaluation/Treatment: Yes Reason for Co-Treatment: For patient/therapist safety;To address functional/ADL transfers PT goals addressed during session: Mobility/safety with mobility;Balance OT goals addressed during session: ADL's and self-care      AM-PAC OT "6 Clicks" Daily Activity     Outcome Measure Help from another person eating meals?: None Help from another person taking care of personal grooming?: A Little Help from another person toileting, which includes using toliet, bedpan, or urinal?: A Little Help from another person bathing (including washing, rinsing, drying)?: A Lot Help from another person to put on and taking off regular upper body clothing?: A Little Help from another person to put on and taking off regular lower  body clothing?: A Little 6 Click Score: 18   End of Session Equipment Utilized During Treatment: Rolling walker (2 wheels) Nurse Communication: Mobility status  Activity  Tolerance: Patient tolerated treatment well Patient left: in chair;with call bell/phone within reach;with chair alarm set  OT Visit Diagnosis: Other abnormalities of gait and mobility (R26.89);Unsteadiness on feet (R26.81);Muscle weakness (generalized) (M62.81)                Time: 0272-5366 OT Time Calculation (min): 37 min Charges:  OT General Charges $OT Visit: 1 Visit OT Evaluation $OT Eval Low Complexity: 1 Low OT Treatments $Self Care/Home Management : 8-22 mins Phoua Hoadley L. Cayleb Jarnigan, OTR/L  03/30/23, 5:05 PM

## 2023-03-30 NOTE — Evaluation (Signed)
Physical Therapy Evaluation (Co-eval with OT) Patient Details Name: Samantha Freeman MRN: [id number] DOB: 1935-07-05 Today's Date: 03/30/2023  History of Present Illness  Pt is a 87 y.o. female admitted with AFIB with RVR, acute hypoxic respiratory failure, acute heart failure, & encephalopathy. PMH significant for atrial fibrillation not on AC, HFpEF, type 2 diabetes, hypertension, hyperlipidemia, CKD stage IIIa  Clinical Impression  87 yo Female presents with acute respiratory failure. She was on High Flow 8L O2 via nasal cannula and therefore had to stay close to bed for O2 management. During evaluation, Spo2 levels stayed between 91-95%. She does have a-fib and HR would fluctuate between 95-116 bpm especially during mobility. Patient requires min A for bed mobility, min A to stand by assist +2 for sit to stand and stand pivot transfers. She requires +2 for equipment management. She was able to take a few steps 2-3 feet to bedside commode requiring stand by assist with RW +2 for equipment management. Patient would benefit from additional skilled PT intervention to improve strength, balance and mobility.         If plan is discharge home, recommend the following: A little help with walking and/or transfers;A little help with bathing/dressing/bathroom;Assistance with cooking/housework;Assist for transportation   Can travel by private vehicle   No    Equipment Recommendations None recommended by PT  Recommendations for Other Services       Functional Status Assessment Patient has had a recent decline in their functional status and demonstrates the ability to make significant improvements in function in a reasonable and predictable amount of time.     Precautions / Restrictions Precautions Precautions: Fall Precaution Comments: watch HR and O2 Restrictions Weight Bearing Restrictions: No      Mobility  Bed Mobility Overal bed mobility: Needs Assistance Bed Mobility: Supine to Sit      Supine to sit: Min assist, +2 for physical assistance, +2 for safety/equipment     General bed mobility comments: bed mobility +2 for inital attempt    Transfers Overall transfer level: Needs assistance Equipment used: Rolling walker (2 wheels) Transfers: Sit to/from Stand, Bed to chair/wheelchair/BSC Sit to Stand: Contact guard assist, Min assist, +2 safety/equipment, From elevated surface   Step pivot transfers: Contact guard assist, +2 safety/equipment       General transfer comment: first transfer required additonal vcs and cues for hand placement, second transfer to [facility] improved with CGA and better control of descent    Ambulation/Gait Ambulation/Gait assistance: Contact guard assist, +2 safety/equipment Gait Distance (Feet): 2 Feet Assistive device: Rolling walker (2 wheels)   Gait velocity: decreased     General Gait Details: Took 2-3 steps to bedside commode requiring CGA for safety and equipment management.  Stairs            Wheelchair Mobility     Tilt Bed    Modified Rankin (Stroke Patients Only)       Balance Overall balance assessment: Needs assistance Sitting-balance support: Feet supported Sitting balance-Leahy Scale: Good Sitting balance - Comments: sits EOB to don socks no LOB   Standing balance support: During functional activity, Bilateral upper extremity supported Standing balance-Leahy Scale: Fair                               Pertinent Vitals/Pain Pain Assessment Pain Assessment: No/denies pain    Home Living Family/patient expects to be discharged to:: Private residence Living Arrangements: Alone Available Help at Discharge:  Family;Available PRN/intermittently Type of Home: House Home Access: Level entry       Home Layout: One level Home Equipment: Cane - single point;Standard Environmental consultant;Shower seat - built in;Grab bars - tub/shower;Hand held Stage manager (4 wheels)      Prior Function Prior Level  of Function : Independent/Modified Independent;Driving             Mobility Comments: Pt reports using a 4W RW at baseline. Pt reports that she hasn't been going out into the community much d/t to fatigue. Pt reports son and his wife help her with groceries. Pt still drives but hasn't done much because of how she has been feeling. ADLs Comments: mod I, uses weekly pill box for medications, light meal prep.     Extremity/Trunk Assessment   Upper Extremity Assessment Upper Extremity Assessment: Generalized weakness    Lower Extremity Assessment Lower Extremity Assessment: Generalized weakness    Cervical / Trunk Assessment Cervical / Trunk Assessment: Normal  Communication   Communication Communication: No apparent difficulties Cueing Techniques: Verbal cues  Cognition Arousal: Alert Behavior During Therapy: WFL for tasks assessed/performed Overall Cognitive Status: Within Functional Limits for tasks assessed                                 General Comments: Pleasant and motivated to work with OT        General Comments General comments (skin integrity, edema, etc.): On 9L HFNC, SpO2 91-98% throughout. HR 99-105 with one increase to 117    Exercises     Assessment/Plan    PT Assessment Patient needs continued PT services  PT Problem List Decreased strength;Decreased activity tolerance;Decreased balance;Decreased mobility;Cardiopulmonary status limiting activity       PT Treatment Interventions DME instruction;Gait training;Functional mobility training;Therapeutic activities;Therapeutic exercise;Balance training;Patient/family education    PT Goals (Current goals can be found in the Care Plan section)  Acute Rehab PT Goals Patient Stated Goal: to go to rehab and get stronger PT Goal Formulation: With patient Time For Goal Achievement: 04/13/23 Potential to Achieve Goals: Good    Frequency Min 1X/week     Co-evaluation   Reason for  Co-Treatment: For patient/therapist safety;To address functional/ADL transfers PT goals addressed during session: Mobility/safety with mobility;Balance OT goals addressed during session: ADL's and self-care       AM-PAC PT "6 Clicks" Mobility  Outcome Measure Help needed turning from your back to your side while in a flat bed without using bedrails?: A Little Help needed moving from lying on your back to sitting on the side of a flat bed without using bedrails?: A Little Help needed moving to and from a bed to a chair (including a wheelchair)?: A Little Help needed standing up from a chair using your arms (e.g., wheelchair or bedside chair)?: A Little Help needed to walk in hospital room?: A Little Help needed climbing 3-5 steps with a railing? : A Little 6 Click Score: 18    End of Session Equipment Utilized During Treatment: Gait belt Activity Tolerance: Patient tolerated treatment well;No increased pain Patient left: with call bell/phone within reach;with chair alarm set;in chair Nurse Communication: Mobility status PT Visit Diagnosis: Muscle weakness (generalized) (M62.81);Difficulty in walking, not elsewhere classified (R26.2)    Time: 1355-1430 PT Time Calculation (min) (ACUTE ONLY): 35 min   Charges:   PT Evaluation $PT Eval Low Complexity: 1 Low   PT General Charges $$ ACUTE PT VISIT: 1 Visit  Ladine Kiper PT, DPT 03/30/2023, 6:14 PM

## 2023-03-30 NOTE — Progress Notes (Signed)
..03/30/2023 7:51 PM  Samantha Freeman [id number]   Temp:  [97.5 F (36.4 C)-98.3 F (36.8 C)] 98 F (36.7 C) (12/07 1602) Pulse Rate:  [41-144] 95 (12/07 1810) Resp:  [14-26] 16 (12/07 1602) BP: (115-130)/(69-104) 120/75 (12/07 1602) SpO2:  [89 %-96 %] 93 % (12/07 1810),     Intake/Output Summary (Last 24 hours) at 03/30/2023 1951 Last data filed at 03/30/2023 1742 Gross per 24 hour  Intake 282.01 ml  Output 1025 ml  Net -742.99 ml    Results for orders placed or performed during the hospital encounter of 03/28/23 (from the past 24 hour(s))  Heparin level (unfractionated)     Status: None   Collection Time: 03/29/23  8:02 PM  Result Value Ref Range   Heparin Unfractionated 0.62 0.30 - 0.70 IU/mL  Glucose, capillary     Status: Abnormal   Collection Time: 03/29/23  8:18 PM  Result Value Ref Range   Glucose-Capillary 276 (H) 70 - 99 mg/dL  Glucose, capillary     Status: Abnormal   Collection Time: 03/29/23 11:46 PM  Result Value Ref Range   Glucose-Capillary 211 (H) 70 - 99 mg/dL  Glucose, capillary     Status: Abnormal   Collection Time: 03/30/23  3:55 AM  Result Value Ref Range   Glucose-Capillary 221 (H) 70 - 99 mg/dL  Basic metabolic panel     Status: Abnormal   Collection Time: 03/30/23  6:37 AM  Result Value Ref Range   Sodium 134 (L) 135 - 145 mmol/L   Potassium 3.7 3.5 - 5.1 mmol/L   Chloride 96 (L) 98 - 111 mmol/L   CO2 28 22 - 32 mmol/L   Glucose, Bld 204 (H) 70 - 99 mg/dL   BUN 24 (H) 8 - 23 mg/dL   Creatinine, Ser 4.69 (H) 0.44 - 1.00 mg/dL   Calcium 9.0 8.9 - 62.9 mg/dL   GFR, Estimated 48 (L) >60 mL/min   Anion gap 10 5 - 15  CBC     Status: None   Collection Time: 03/30/23  6:37 AM  Result Value Ref Range   WBC 9.4 4.0 - 10.5 K/uL   RBC 4.70 3.87 - 5.11 MIL/uL   Hemoglobin 13.8 12.0 - 15.0 g/dL   HCT 52.8 41.3 - 24.4 %   MCV 89.6 80.0 - 100.0 fL   MCH 29.4 26.0 - 34.0 pg   MCHC 32.8 30.0 - 36.0 g/dL   RDW 01.0 27.2 - 53.6 %   Platelets 219  150 - 400 K/uL   nRBC 0.0 0.0 - 0.2 %  Magnesium     Status: None   Collection Time: 03/30/23  6:37 AM  Result Value Ref Range   Magnesium 1.8 1.7 - 2.4 mg/dL  Heparin level (unfractionated)     Status: None   Collection Time: 03/30/23  6:37 AM  Result Value Ref Range   Heparin Unfractionated 0.58 0.30 - 0.70 IU/mL  Glucose, capillary     Status: Abnormal   Collection Time: 03/30/23  7:38 AM  Result Value Ref Range   Glucose-Capillary 198 (H) 70 - 99 mg/dL  Glucose, capillary     Status: Abnormal   Collection Time: 03/30/23  1:04 PM  Result Value Ref Range   Glucose-Capillary 333 (H) 70 - 99 mg/dL  Glucose, capillary     Status: Abnormal   Collection Time: 03/30/23  4:17 PM  Result Value Ref Range   Glucose-Capillary 288 (H) 70 - 99 mg/dL    SUBJECTIVE:  No significant bleeding overnight.  Patient reports continued crusting in nose right worse than left.  Stable breathing with oxygen requirement.  OBJECTIVE:  GEN-  NAD, sitting upright in bed NOSE-  crusting filling bilateral nares Right worse than Left.  Firm and hardened. OC/OP-  old blood in posterior oropharynx.  IMPRESSION:  Epistaxis recurrent with need for anticoagulation.  PLAN:  Patient continues to refuse packing.  Today she also said she did not want to have general anesthesia to control her nose bleed if it worsened.  Discussed at bedside with son and patient what implications of this means and that it limits my ability significantly for options to control recurrent epistaxis.  Given her not wanting any packing/OR and no bleeding in last 1.5 day I recommend continued moisturization, Afrin prn, humidification and will avoid any manipulation of the patient's nasal cavity.  Will have her be NPO at midnight on 12/8 in case Vascular Surgery is able to do selective embolization of her right I-max.  Samantha Freeman 03/30/2023, 7:51 PM

## 2023-03-31 DIAGNOSIS — J9601 Acute respiratory failure with hypoxia: Secondary | ICD-10-CM | POA: Diagnosis not present

## 2023-03-31 LAB — BASIC METABOLIC PANEL
Anion gap: 12 (ref 5–15)
BUN: 26 mg/dL — ABNORMAL HIGH (ref 8–23)
CO2: 26 mmol/L (ref 22–32)
Calcium: 9.3 mg/dL (ref 8.9–10.3)
Chloride: 94 mmol/L — ABNORMAL LOW (ref 98–111)
Creatinine, Ser: 0.98 mg/dL (ref 0.44–1.00)
GFR, Estimated: 57 mL/min — ABNORMAL LOW (ref >60)
Glucose, Bld: 263 mg/dL — ABNORMAL HIGH (ref 70–99)
Potassium: 3.8 mmol/L (ref 3.5–5.1)
Sodium: 132 mmol/L — ABNORMAL LOW (ref 135–145)

## 2023-03-31 LAB — CBC
HCT: 43.2 % (ref 36.0–46.0)
Hemoglobin: 14.4 g/dL (ref 12.0–15.0)
MCH: 29.7 pg (ref 26.0–34.0)
MCHC: 33.3 g/dL (ref 30.0–36.0)
MCV: 89.1 fL (ref 80.0–100.0)
Platelets: 234 10*3/uL (ref 150–400)
RBC: 4.85 MIL/uL (ref 3.87–5.11)
RDW: 14.9 % (ref 11.5–15.5)
WBC: 8.3 10*3/uL (ref 4.0–10.5)
nRBC: 0 % (ref 0.0–0.2)

## 2023-03-31 LAB — GLUCOSE, CAPILLARY
Glucose-Capillary: 303 mg/dL — ABNORMAL HIGH (ref 70–99)
Glucose-Capillary: 337 mg/dL — ABNORMAL HIGH (ref 70–99)
Glucose-Capillary: 352 mg/dL — ABNORMAL HIGH (ref 70–99)
Glucose-Capillary: 363 mg/dL — ABNORMAL HIGH (ref 70–99)

## 2023-03-31 LAB — MAGNESIUM: Magnesium: 1.8 mg/dL (ref 1.7–2.4)

## 2023-03-31 LAB — HEPARIN LEVEL (UNFRACTIONATED): Heparin Unfractionated: 0.43 [IU]/mL (ref 0.30–0.70)

## 2023-03-31 MED ORDER — DILTIAZEM HCL 30 MG PO TABS
90.0000 mg | ORAL_TABLET | Freq: Three times a day (TID) | ORAL | Status: DC
  Administered 2023-03-31 – 2023-04-01 (×3): 90 mg via ORAL
  Filled 2023-03-31 (×3): qty 3

## 2023-03-31 MED ORDER — ROPINIROLE HCL 1 MG PO TABS
1.0000 mg | ORAL_TABLET | Freq: Every day | ORAL | Status: DC
  Administered 2023-03-31 – 2023-04-07 (×8): 1 mg via ORAL
  Filled 2023-03-31 (×9): qty 1

## 2023-03-31 MED ORDER — INSULIN GLARGINE-YFGN 100 UNIT/ML ~~LOC~~ SOLN
20.0000 [IU] | Freq: Every day | SUBCUTANEOUS | Status: DC
  Administered 2023-03-31: 20 [IU] via SUBCUTANEOUS
  Filled 2023-03-31: qty 0.2

## 2023-03-31 NOTE — Progress Notes (Signed)
..03/31/2023 9:34 AM  Kerrin Champagne [id number]   Temp:  [98 F (36.7 C)-98.7 F (37.1 C)] 98.7 F (37.1 C) (12/08 0408) Pulse Rate:  [43-105] 82 (12/08 0408) Resp:  [14-21] 19 (12/08 0408) BP: (117-130)/(54-82) 129/74 (12/08 0408) SpO2:  [91 %-96 %] 92 % (12/08 0408),     Intake/Output Summary (Last 24 hours) at 03/31/2023 0934 Last data filed at 03/31/2023 0400 Gross per 24 hour  Intake 623.48 ml  Output 750 ml  Net -126.52 ml    Results for orders placed or performed during the hospital encounter of 03/28/23 (from the past 24 hour(s))  Glucose, capillary     Status: Abnormal   Collection Time: 03/30/23  1:04 PM  Result Value Ref Range   Glucose-Capillary 333 (H) 70 - 99 mg/dL  Glucose, capillary     Status: Abnormal   Collection Time: 03/30/23  4:17 PM  Result Value Ref Range   Glucose-Capillary 288 (H) 70 - 99 mg/dL  Glucose, capillary     Status: Abnormal   Collection Time: 03/30/23  9:03 PM  Result Value Ref Range   Glucose-Capillary 394 (H) 70 - 99 mg/dL  Basic metabolic panel     Status: Abnormal   Collection Time: 03/31/23  6:22 AM  Result Value Ref Range   Sodium 132 (L) 135 - 145 mmol/L   Potassium 3.8 3.5 - 5.1 mmol/L   Chloride 94 (L) 98 - 111 mmol/L   CO2 26 22 - 32 mmol/L   Glucose, Bld 263 (H) 70 - 99 mg/dL   BUN 26 (H) 8 - 23 mg/dL   Creatinine, Ser 4.09 0.44 - 1.00 mg/dL   Calcium 9.3 8.9 - 81.1 mg/dL   GFR, Estimated 57 (L) >60 mL/min   Anion gap 12 5 - 15  CBC     Status: None   Collection Time: 03/31/23  6:22 AM  Result Value Ref Range   WBC 8.3 4.0 - 10.5 K/uL   RBC 4.85 3.87 - 5.11 MIL/uL   Hemoglobin 14.4 12.0 - 15.0 g/dL   HCT 91.4 78.2 - 95.6 %   MCV 89.1 80.0 - 100.0 fL   MCH 29.7 26.0 - 34.0 pg   MCHC 33.3 30.0 - 36.0 g/dL   RDW 21.3 08.6 - 57.8 %   Platelets 234 150 - 400 K/uL   nRBC 0.0 0.0 - 0.2 %  Magnesium     Status: None   Collection Time: 03/31/23  6:22 AM  Result Value Ref Range   Magnesium 1.8 1.7 - 2.4 mg/dL   Heparin level (unfractionated)     Status: None   Collection Time: 03/31/23  6:22 AM  Result Value Ref Range   Heparin Unfractionated 0.43 0.30 - 0.70 IU/mL    SUBJECTIVE:  No acute events.  Had restless legs last night and didn't sleep well.  Gets blood out of left nostril today when dabbing.  Denies any significant bleeding  OBJECTIVE:  GEN-  NAD sitting upright in chair NOSE-  right nostril with wet debris and apparent piece of tissue within it.  This was removed with bayonet forceps revealing a patent nostril without active bleeding or prominent vessel anteriorly.  Left nostril with bloody crust and old dissolving blood.  No active bleeding today. OC/OP- old blood in posterior oropharynx  IMPRESSION:  Epistaxis recurrent failed packing/cauterization with need for continued anticoagulation.  Patient refusing repeat packing and/or trip to OR for endoscopic cautery  PLAN:  Recurrent epistaxis that has failed outpatient packing  and cauterization x 3 with need for anticoagulation given Afib and pulmonary emobli.   Previously had discussed with Vascular surgery regarding selective emoblization.  Most of patient's significant bleeding has come from right side of nasal cavity but at times left has also had bleeding.  Will make NPO at midnight in case Vascular surgery wishes to pursue embolization this next week.  Dewaun Kinzler 03/31/2023, 9:34 AM

## 2023-03-31 NOTE — TOC Progression Note (Signed)
Transition of Care [facility]) - Progression Note    Patient Details  Name: Samantha Freeman MRN: [id number] Date of Birth: 08/31/1935  [facility]) CM/SW Contact  Bing Quarry, RN Phone Number: 03/31/2023, 3:29 PM  Clinical Narrative: 12/8: Admitted 03/28/23 for increasing SOB. Significant PMH for afib not on AC, HFpEF, type 2 diabetes, HTN, hyperlipidemia, CKD stage 3a, depression/anxiety.   Recent hospitalizations for Afib with RVR.  Provider dx acute with respiratory failure hypoxia, A-fib with RVR, acute on chronic HFpEF. Also PE. Had nosebleed (and history of this) from heparin for PE,with Otolaryngology consult.  On HFNC 8L/[place] today.    Disposition: Per prior CM ED note, patient active with [facility] [phone] and they would like update prior to DC.    TOC to follow through final disposition and discharge needs.    Gabriel Cirri MSN RN CM  Care Management Department.  [place]  [facility] Direct Dial: [phone] Main Office Phone: [phone] Weekends Only      Expected Discharge Plan: Home w Home Health Services Barriers to Discharge: Continued Medical Work up  Expected Discharge Plan and Services In-house Referral: Clinical Social Work   Post Acute Care Choice: NA Loreli Slot) Living arrangements for the past 2 months: Single Family Home                   DME Agency: Other - Comment (Patient with [facility]) Date DME Agency Contacted: 03/28/23 Time DME Agency Contacted: 1620 Representative spoke with at DME Agency: Jennette Kettle [phone] HH Arranged: PT, OT HH Agency: NA ([facility])     Representative spoke with at [facility] Agency: Deanna [phone]   Social Determinants of Health (SDOH) Interventions SDOH Screenings   Food Insecurity: No Food Insecurity (03/28/2023)  Housing: Low Risk  (03/28/2023)  Transportation Needs: No Transportation Needs (03/28/2023)  Utilities: Not At Risk (03/28/2023)  Tobacco Use: Low Risk  (03/28/2023)    Readmission Risk  Interventions    03/28/2023    4:20 PM  Readmission Risk Prevention Plan  Transportation Screening Complete  PCP or Specialist Appt within 3-5 Days Complete  HRI or Home Care Consult Complete  Social Work Consult for Recovery Care Planning/Counseling Complete  Palliative Care Screening Not Applicable  Medication Review Oceanographer) Complete

## 2023-03-31 NOTE — Consult Note (Signed)
PHARMACY - ANTICOAGULATION CONSULT NOTE  Pharmacy Consult for Heparin Indication: pulmonary embolus  Allergies  Allergen Reactions   Buspirone     Other Reaction(s): Dizziness   Propofol Anaphylaxis   Zolpidem Other (See Comments)    Hallucinations   Cholestyramine Itching and Rash   Codeine Nausea Only   Hydrochlorothiazide Other (See Comments)    PATIENT DOES NOT REMEMBER   Loratadine Other (See Comments)    PATIENT DOES NOT REMEMBER   Niacin Rash   Patient Measurements: Height: 5\' 4"  (162.6 cm) Weight: 81 kg (178 lb 9.2 oz) IBW/kg (Calculated) : 54.7 Heparin Dosing Weight: 73.7 kg   Vital Signs: Temp: 98.7 F (37.1 C) (12/08 0408) Temp Source: Oral (12/08 0408) BP: 129/74 (12/08 0408) Pulse Rate: 82 (12/08 0408)  Labs: Recent Labs    03/28/23 1038 03/28/23 1038 03/28/23 1441 03/28/23 1736 03/29/23 0202 03/29/23 0954 03/29/23 2002 03/30/23 0637 03/31/23 0622  HGB 14.6  --   --   --  13.1  --   --  13.8 14.4  HCT 44.9  --   --   --  40.1  --   --  42.1 43.2  PLT 210  --   --   --  191  --   --  219 234  APTT  --   --   --  32  --   --   --   --   --   LABPROT  --   --   --  15.3*  --   --   --   --   --   INR  --   --   --  1.2  --   --   --   --   --   HEPARINUNFRC  --    < >  --  <0.10* 0.66   < > 0.62 0.58 0.43  CREATININE 1.29*  --   --   --  1.05*  --   --  1.12* 0.98  TROPONINIHS 9  --  9  --   --   --   --   --   --    < > = values in this interval not displayed.   Estimated Creatinine Clearance: 43.2 mL/min (by C-G formula based on SCr of 0.98 mg/dL).  Medical History: Past Medical History:  Diagnosis Date   Anemia    Anxiety    Aortic stenosis, mild    Arthritis    CKD (chronic kidney disease), stage III (HCC)    Complication of anesthesia    Propofol anaphylaxis   Cortical cataract    DDD (degenerative disc disease), lumbar    Depression    Dyspnea    GERD (gastroesophageal reflux disease)    Headache    Heart murmur    History of  2019 novel coronavirus disease (COVID-19) 12/01/2018   HLD (hyperlipidemia)    Hypertension    Pneumonia    Schatzki's ring    Seasonal allergies    T2DM (type 2 diabetes mellitus) (HCC)    Valvular insufficiency    Medications:  Patient was taking apixaban previously but it was held due to epistaxis - most recently filled in October; per patient she stopped taking in October   Assessment: Samantha Freeman is an 87 year old female that presented with difficulty breathing. PMH is significant for atrial fibrillation not on AC, HFpEF, type 2 diabetes, hypertension, hyperlipidemia, CKD stage IIIa, depression/anxiety presented acute respiratory failure hypoxia, A-fib with RVR,  and acute on chronic HFpEF. From chart review, patient was taking apixaban previously but it was held due to epistaxis. CTA of the chest notable for bilateral small to moderate PE. Pharmacy has been consulted for initiation and management of a heparin infusion. Baseline labs: Hgb 14.6, PLT 210, aPTT and PT/INR ordered.   Goal of Therapy:  Heparin level 0.3 - 0.7  Monitor platelets by anticoagulation protocol: Yes  12/6@0202 : HL=0.66, therapeutic X 1@1250  units/hr 12/6@0954 : HL=0.74, supratherapeutic@1250  units/hr 12/6@2018 : HL=0.62, Therapeutic x 1 12/7@0637 : HL=0.58, Therapeutic X 2  12/7@0622 : HL=0.43, therapeutic x3   Plan:  - continue heparin infusion at 1150 units/hr - recheck HL with AM labs - Monitor CBC and HL daily   Samantha Freeman A, PharmD 03/31/2023 8:35 AM

## 2023-03-31 NOTE — Progress Notes (Signed)
PROGRESS NOTE    Samantha Freeman  ZHY:[id number] DOB: 1935/05/29 DOA: 03/28/2023 PCP: Marguarite Arbour, MD  [id number]  LOS: 3 days   Brief hospital course:   Assessment & Plan: Samantha Freeman is a 87 y.o. female with medical history significant of atrial fibrillation not on AC, HFpEF, type 2 diabetes, hypertension, hyperlipidemia, CKD stage IIIa, depression/anxiety presented acute respiratory failure hypoxia, A-fib with RVR, acute on chronic HFpEF.  Patient reports increased work of breathing over the past 1 to 2 days.    Atrial fibrillation with RVR (HCC) --3 hospitalizations in 2 months for the same.  Most recently was placed on metoprolol, diltiazem, and digoxin for rate control.  Not on anticoagulation due to hx/o epistaxis.   --started on dilt gtt, [facility] cardiology consulted. Plan: --cont dilt gtt  --increase oral dilt to 90 q8h --cont metop 50 BID --cont heparin gtt  Acute hypoxic respiratory failure (HCC) Decompensated respiratory failure requiring initially BiPAP and now transition to high flow nasal cannula 8L. --in the setting A-fib with RVR and acute on chronic HFpEF and PE. Plan: --treat underlying causes --Continue supplemental O2 to keep sats >=92%, wean as tolerated  Acute PE --New bilateral few segmental and smaller pulmonary emboli.  --cont heparin gtt  Acute heart failure with preserved ejection fraction (HFpEF) (HCC) 2D echo November 2024 with EF of 60 to 65% BNP 600s with positive cardiomegaly on chest x-ray 1-2+ pitting edema bilaterally --s/p IV lasix 40 x1 in ED and IV lasix 20 x1 per cardio Plan: --further diuresis per cardio  Recurrent epistaxis --in response to anticoagulation.  Per ENT, has been "packed cauterized 3x by [place] in last 2 months." --ENT consulted, pt repeated stressed "she would rather die than have nasal packing."   Plan: --If bleeds, recommend pressure with clamp, Afrin, humidification.  --possible embolization on  Monday  Encephalopathy Reported patient with transient episode of hallucination Appears to be alert at the bedside Will monitor for now CT head pending Add on VBG in the setting of hypoxia As needed Zyprexa for agitation Monitor  GERD (gastroesophageal reflux disease) PPI    HTN (hypertension) --on rate control agents as above  DM (diabetes mellitus) (HCC) --increase glargine to 20u nightly --ACHS and SSI   Chronic kidney disease, stage 3a (HCC) Cr 1.3 w/ GFR in 40s  Looks to be near baseline  Monitor    DVT prophylaxis: [id number] gtt Code Status: DNR  Family Communication:  Level of care: Progressive Dispo:   The patient is from: home Anticipated d/c is to: home Anticipated d/c date is: to be determined    Subjective and Interval History:  Pt reported her nostrils had cleared up quite a bit.  Complained of restless leg last night.   Objective: Vitals:   03/30/23 2200 03/30/23 2314 03/31/23 0408 03/31/23 1232  BP:  (!) 125/54 129/74 124/64  Pulse: 98 78 82 68  Resp: (!) 21 17 19    Temp:  98.1 F (36.7 C) 98.7 F (37.1 C) 98.3 F (36.8 C)  TempSrc:  Oral Oral Oral  SpO2: 91% 92% 92%   Weight:      Height:        Intake/Output Summary (Last 24 hours) at 03/31/2023 1739 Last data filed at 03/31/2023 1520 Gross per 24 hour  Intake 1608.74 ml  Output 650 ml  Net 958.74 ml   Filed Weights   03/28/23 1029 03/28/23 2145  Weight: 86.1 kg 81 kg    Examination:   Constitutional: NAD, AAOx3 HEENT:  conjunctivae and lids normal, EOMI CV: No cyanosis.   RESP: normal respiratory effort, mild crackles at posterior bases, on 8L Neuro: II - XII grossly intact.   Psych: Normal mood and affect.  Appropriate judgement and reason   Data Reviewed: I have personally reviewed labs and imaging studies  Time spent: 35 minutes  Darlin Priestly, MD [facility] If 7PM-7AM, please contact night-coverage 03/31/2023, 5:39 PM

## 2023-04-01 DIAGNOSIS — R040 Epistaxis: Secondary | ICD-10-CM

## 2023-04-01 DIAGNOSIS — J9601 Acute respiratory failure with hypoxia: Secondary | ICD-10-CM | POA: Diagnosis not present

## 2023-04-01 LAB — CBC
HCT: 41.3 % (ref 36.0–46.0)
Hemoglobin: 13.7 g/dL (ref 12.0–15.0)
MCH: 29.3 pg (ref 26.0–34.0)
MCHC: 33.2 g/dL (ref 30.0–36.0)
MCV: 88.2 fL (ref 80.0–100.0)
Platelets: 226 10*3/uL (ref 150–400)
RBC: 4.68 MIL/uL (ref 3.87–5.11)
RDW: 14.7 % (ref 11.5–15.5)
WBC: 6.5 10*3/uL (ref 4.0–10.5)
nRBC: 0 % (ref 0.0–0.2)

## 2023-04-01 LAB — GLUCOSE, CAPILLARY
Glucose-Capillary: 321 mg/dL — ABNORMAL HIGH (ref 70–99)
Glucose-Capillary: 294 mg/dL — ABNORMAL HIGH (ref 70–99)
Glucose-Capillary: 321 mg/dL — ABNORMAL HIGH (ref 70–99)
Glucose-Capillary: 282 mg/dL — ABNORMAL HIGH (ref 70–99)

## 2023-04-01 LAB — MAGNESIUM: Magnesium: 1.6 mg/dL — ABNORMAL LOW (ref 1.7–2.4)

## 2023-04-01 LAB — BASIC METABOLIC PANEL
Anion gap: 10 (ref 5–15)
BUN: 25 mg/dL — ABNORMAL HIGH (ref 8–23)
CO2: 24 mmol/L (ref 22–32)
Calcium: 9.1 mg/dL (ref 8.9–10.3)
Chloride: 98 mmol/L (ref 98–111)
Creatinine, Ser: 0.91 mg/dL (ref 0.44–1.00)
GFR, Estimated: >60 mL/min (ref >60)
Glucose, Bld: 296 mg/dL — ABNORMAL HIGH (ref 70–99)
Potassium: 4 mmol/L (ref 3.5–5.1)
Sodium: 132 mmol/L — ABNORMAL LOW (ref 135–145)

## 2023-04-01 LAB — HEPARIN LEVEL (UNFRACTIONATED): Heparin Unfractionated: 0.47 [IU]/mL (ref 0.30–0.70)

## 2023-04-01 MED ORDER — INSULIN ASPART 100 UNIT/ML IJ SOLN
4.0000 [IU] | Freq: Three times a day (TID) | INTRAMUSCULAR | Status: DC
  Administered 2023-04-01 (×2): 4 [IU] via SUBCUTANEOUS
  Filled 2023-04-01 (×2): qty 1

## 2023-04-01 MED ORDER — DILTIAZEM HCL 30 MG PO TABS
90.0000 mg | ORAL_TABLET | Freq: Four times a day (QID) | ORAL | Status: DC
Start: 2023-04-01 — End: 2023-04-02
  Administered 2023-04-01 – 2023-04-02 (×4): 90 mg via ORAL
  Filled 2023-04-01 (×4): qty 3

## 2023-04-01 MED ORDER — FUROSEMIDE 20 MG PO TABS
20.0000 mg | ORAL_TABLET | Freq: Every day | ORAL | Status: DC
  Administered 2023-04-01 – 2023-04-08 (×7): 20 mg via ORAL
  Filled 2023-04-01 (×7): qty 1

## 2023-04-01 MED ORDER — GLUCERNA SHAKE PO LIQD
237.0000 mL | Freq: Three times a day (TID) | ORAL | Status: DC
  Administered 2023-04-01 – 2023-04-08 (×15): 237 mL via ORAL

## 2023-04-01 MED ORDER — INSULIN GLARGINE-YFGN 100 UNIT/ML ~~LOC~~ SOLN
25.0000 [IU] | Freq: Every day | SUBCUTANEOUS | Status: DC
  Administered 2023-04-01: 25 [IU] via SUBCUTANEOUS
  Filled 2023-04-01: qty 0.25

## 2023-04-01 NOTE — Progress Notes (Signed)
[facility] CLINIC CARDIOLOGY PROGRESS NOTE       Patient ID: Samantha Freeman MRN: [id number] DOB/AGE: Aug 09, 1935 87 y.o.  Admit date: 03/28/2023 Referring Physician Dr. Doree Albee Primary Physician Sparks, Duane Lope, MD  Primary Cardiologist Dr. Darrold Junker Reason for Consultation AoCHF, AF RVR  HPI: Samantha Freeman is a 87 y.o. female  with a past medical history of  mild aortic stenosis, bradycardia, type 2 diabetes, hypercholesterolemia  who presented to the ED on 03/28/2023 for shortness of breath, fatigue. Cardiology was consulted for further evaluation.   Interval history: -Patient reports she is feeling ok this AM.  -States SOB is mildly improved. Remains on 5 L [place].  -Remains in AF with controlled rate. Denies CP, palpitations.  -Possibly going to embolization today for epistaxis.   Review of systems complete and found to be negative unless listed above    Past Medical History:  Diagnosis Date   Anemia    Anxiety    Aortic stenosis, mild    Arthritis    CKD (chronic kidney disease), stage III (HCC)    Complication of anesthesia    Propofol anaphylaxis   Cortical cataract    DDD (degenerative disc disease), lumbar    Depression    Dyspnea    GERD (gastroesophageal reflux disease)    Headache    Heart murmur    History of 2019 novel coronavirus disease (COVID-19) 12/01/2018   HLD (hyperlipidemia)    Hypertension    Pneumonia    Schatzki's ring    Seasonal allergies    T2DM (type 2 diabetes mellitus) (HCC)    Valvular insufficiency     Past Surgical History:  Procedure Laterality Date   ABDOMINAL HYSTERECTOMY     APPENDECTOMY     BICEPT TENODESIS Right 05/13/2018   Procedure: BICEPS TENODESIS;  Surgeon: Christena Flake, MD;  Location: [facility] ORS;  Service: Orthopedics;  Laterality: Right;   CARDIAC CATHETERIZATION     COLONOSCOPY     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   EYE SURGERY Right 09/2013   Corneal transplant   JOINT REPLACEMENT Right 2015    knee   REVERSE SHOULDER ARTHROPLASTY Left 10/13/2020   Procedure: REVERSE SHOULDER ARTHROPLASTY;  Surgeon: Christena Flake, MD;  Location: [facility] ORS;  Service: Orthopedics;  Laterality: Left;   SHOULDER ARTHROSCOPY WITH ROTATOR CUFF REPAIR Right 05/13/2018   Procedure: SHOULDER ARTHROSCOPY WITH DEBRIDEMENT, DECOMPRESSION AND ROTATOR CUFF REPAIR;  Surgeon: Christena Flake, MD;  Location: [facility] ORS;  Service: Orthopedics;  Laterality: Right;   TONSILLECTOMY      Medications Prior to Admission  Medication Sig Dispense Refill Last Dose   ALPRAZolam (XANAX) 0.5 MG tablet Take 0.5 mg by mouth at bedtime as needed for anxiety or sleep.   03/27/2023   aspirin EC 81 MG tablet Take 1 tablet (81 mg total) by mouth daily. Swallow whole. 30 tablet 0 03/27/2023   Calcium Carb-Cholecalciferol (CALCIUM 600/VITAMIN D3 PO) Take 1 tablet by mouth daily.   03/27/2023   Coenzyme Q10 10 MG capsule Take 10 mg by mouth every morning.   03/27/2023   dapagliflozin propanediol (FARXIGA) 10 MG TABS tablet Take 10 mg by mouth daily.   03/27/2023   digoxin (LANOXIN) 0.125 MG tablet Take 1 tablet (0.125 mg total) by mouth every other day. 15 tablet 0 03/27/2023   diltiazem (CARDIZEM CD) 180 MG 24 hr capsule Take 1 capsule (180 mg total) by mouth daily. 30 capsule 0 03/27/2023   fenofibrate (TRICOR) 48 MG  tablet Take 1 tablet by mouth daily.   03/27/2023   furosemide (LASIX) 20 MG tablet Take 20 mg by mouth daily as needed for fluid or edema.   prn at unknown   gabapentin (NEURONTIN) 100 MG capsule Take 200 mg by mouth 3 (three) times daily.   03/27/2023   hydrOXYzine (ATARAX) 25 MG tablet Take 1 tablet by mouth 2 (two) times daily.   03/27/2023   insulin lispro (HUMALOG) 100 UNIT/ML injection Inject 7-19 Units into the skin 3 (three) times daily before meals. Blood Glucose level: 140-199 - 7 units, 200-250 - 9 units, 251-299 - 13 units,  300-349 - 17 units,  350 or above 19 units.   03/27/2023   LANTUS SOLOSTAR 100 UNIT/ML Solostar Pen Inject  36 Units into the skin at bedtime.   03/27/2023   magnesium oxide (MAG-OX) 400 MG tablet Take 800 mg by mouth 3 (three) times daily.   03/27/2023   metoprolol tartrate (LOPRESSOR) 50 MG tablet Take 1 tablet (50 mg total) by mouth 2 (two) times daily. 60 tablet 0 03/27/2023   Multiple Vitamin (MULTIVITAMIN) tablet Take 1 tablet by mouth daily. Women's Daily Multivitamin   03/27/2023   pantoprazole (PROTONIX) 40 MG tablet Take 1 tablet (40 mg total) by mouth daily.   03/27/2023   PARoxetine (PAXIL) 10 MG tablet Take 10 mg by mouth daily.   03/27/2023   prednisoLONE acetate (PRED FORTE) 1 % ophthalmic suspension Place 1 drop into both eyes at bedtime.   03/27/2023   rOPINIRole (REQUIP) 1 MG tablet Take 1 mg by mouth at bedtime.   03/27/2023   rosuvastatin (CRESTOR) 10 MG tablet Take 10 mg by mouth daily.   03/27/2023   vitamin B-12 (CYANOCOBALAMIN) 1000 MCG tablet Take 1,000 mcg by mouth daily.   03/27/2023   blood glucose meter kit and supplies KIT Dispense based on patient and insurance preference. Use up to four times daily as directed. (FOR ICD-9 250.00, 250.01). For QAC - HS accuchecks. 1 each 1    ferrous sulfate 325 (65 FE) MG tablet Take 325 mg by mouth daily with breakfast. (Patient not taking: Reported on 02/25/2023)      glucose blood (FREESTYLE LITE) test strip For glucose testing every before meals at bedtime. Diagnosis E 11.65  Can substitute to any accepted brand 100 each 0    Insulin Syringe-Needle U-100 25G X 1" 1 ML MISC For 4 times a day insulin SQ, 1 month supply. Diagnosis E11.65 30 each 0    traZODone (DESYREL) 50 MG tablet Take 50 mg by mouth at bedtime. (Patient not taking: Reported on 03/28/2023)   Not Taking   Social History   Socioeconomic History   Marital status: Widowed    Spouse name: Not on file   Number of children: Not on file   Years of education: Not on file   Highest education level: Not on file  Occupational History   Not on file  Tobacco Use   Smoking status:  Never   Smokeless tobacco: Never  Vaping Use   Vaping status: Never Used  Substance and Sexual Activity   Alcohol use: Never   Drug use: Never   Sexual activity: Not Currently  Other Topics Concern   Not on file  Social History Narrative   Lives alone, has support from son who is a Optician, dispensing and daughter in Social worker. Will be with mother when she has surgery.   Social Determinants of Health   Financial Resource Strain: Not on  file  Food Insecurity: No Food Insecurity (03/28/2023)   Hunger Vital Sign    Worried About Running Out of Food in the Last Year: Never true    Ran Out of Food in the Last Year: Never true  Transportation Needs: No Transportation Needs (03/28/2023)   PRAPARE - Administrator, Civil Service (Medical): No    Lack of Transportation (Non-Medical): No  Physical Activity: Not on file  Stress: Not on file  Social Connections: Not on file  Intimate Partner Violence: Not At Risk (03/28/2023)   Humiliation, Afraid, Rape, and Kick questionnaire    Fear of Current or Ex-Partner: No    Emotionally Abused: No    Physically Abused: No    Sexually Abused: No    Family History  Problem Relation Age of Onset   Breast cancer Paternal Aunt      Vitals:   04/01/23 0400 04/01/23 0651 04/01/23 0724 04/01/23 0833  BP: 135/68 (!) 145/95  122/88  Pulse: 92   64  Resp: 18   16  Temp: 98 F (36.7 C)   98.2 F (36.8 C)  TempSrc: Oral     SpO2: 92%  95% 94%  Weight:      Height:        PHYSICAL EXAM General: Ill appearing elderly female, well nourished, in no acute distress. HEENT: Normocephalic and atraumatic. Neck: No JVD.  Lungs: Normal respiratory effort on 5L HFNC. Clear bilaterally to auscultation. No wheezes, crackles, rhonchi.  Heart: Irregularly irregular. Normal S1 and S2 without gallops or murmurs.  Abdomen: Non-distended appearing.  Msk: Normal strength and tone for age. Extremities: Warm and well perfused. No clubbing, cyanosis. No edema.  Neuro:  Alert and oriented X 3. Psych: Answers questions appropriately.   Labs: Basic Metabolic Panel: Recent Labs    03/31/23 0622 04/01/23 0604  NA 132* 132*  K 3.8 4.0  CL 94* 98  CO2 26 24  GLUCOSE 263* 296*  BUN 26* 25*  CREATININE 0.98 0.91  CALCIUM 9.3 9.1  MG 1.8 1.6*   Liver Function Tests: No results for input(s): "AST", "ALT", "ALKPHOS", "BILITOT", "PROT", "ALBUMIN" in the last 72 hours.  No results for input(s): "LIPASE", "AMYLASE" in the last 72 hours. CBC: Recent Labs    03/31/23 0622 04/01/23 0604  WBC 8.3 6.5  HGB 14.4 13.7  HCT 43.2 41.3  MCV 89.1 88.2  PLT 234 226   Cardiac Enzymes: No results for input(s): "CKTOTAL", "CKMB", "CKMBINDEX", "TROPONINIHS" in the last 72 hours.  BNP: No results for input(s): "BNP" in the last 72 hours.  D-Dimer: No results for input(s): "DDIMER" in the last 72 hours. Hemoglobin A1C: No results for input(s): "HGBA1C" in the last 72 hours. Fasting Lipid Panel: No results for input(s): "CHOL", "HDL", "LDLCALC", "TRIG", "CHOLHDL", "LDLDIRECT" in the last 72 hours. Thyroid Function Tests: No results for input(s): "TSH", "T4TOTAL", "T3FREE", "THYROIDAB" in the last 72 hours.  Invalid input(s): "FREET3" Anemia Panel: No results for input(s): "VITAMINB12", "FOLATE", "FERRITIN", "TIBC", "IRON", "RETICCTPCT" in the last 72 hours.   Radiology: CT Angio Chest PE W and/or Wo Contrast  Result Date: 03/28/2023 CLINICAL DATA:  Shortness of breath EXAM: CT ANGIOGRAPHY CHEST WITH CONTRAST TECHNIQUE: Multidetector CT imaging of the chest was performed using the standard protocol during bolus administration of intravenous contrast. Multiplanar CT image reconstructions and MIPs were obtained to evaluate the vascular anatomy. RADIATION DOSE REDUCTION: This exam was performed according to the departmental dose-optimization program which includes automated exposure control,  adjustment of the mA and/or kV according to patient size and/or use of  iterative reconstruction technique. CONTRAST:  75mL OMNIPAQUE IOHEXOL 350 MG/ML SOLN COMPARISON:  CT angiogram 02/25/2023.  X-ray 03/28/2023 and older. FINDINGS: Cardiovascular: Heart is enlarged. Trace pericardial fluid. Coronary artery calcifications are seen. Minimal contrast opacification along the thoracic aorta. Grossly normal course and caliber with vascular calcifications. There is some dilatation of the main pulmonary artery. Please correlate for pulmonary artery hypertension. There is a pulmonary embolism identified in the upper aspect of the middle lobe as seen on series 6, image 213. This has not seen on the previous examination. There is also embolus along the lingula on series 6, image 180, not seen previously. Overall mild clot burden. No larger or more central embolus. Some limited evaluation related to motion. Mediastinum/Nodes: Patulous esophagus. Heterogeneous thyroid gland. There are several prominent nodes identified in the mediastinum and hilum bilaterally, similar to previous. No axillary nodes. Lungs/Pleura: Trace pleural fluid with the adjacent parenchymal opacities. The opacities are increasing from previous at the lower lobes. There are also some dependent areas in the upper lobes. There is some new nodules identified as well in the left upper lobe measuring 10 mm on series 5, image 38. With the time course favor a infectious or inflammatory process as this has not seen previously. There are areas of bronchial wall thickening, mucous plugging and interstitial septal thickening. Few areas of ground-glass as well. Upper Abdomen: The adrenal glands are preserved in the upper abdomen. Stomach is relatively collapsed punctate nonobstructing right-sided renal stone at the edge of the imaging field. Musculoskeletal: Surgical changes about the left shoulder. Scattered degenerative changes along the spine. Review of the MIP images confirms the above findings. Critical Value/emergent results were  called by telephone at the time of interpretation on 03/28/2023 at 4:21 pm EST to provider Dr. Alvester Morin, who verbally acknowledged these results. IMPRESSION: New bilateral few segmental and smaller pulmonary emboli. Mild clot burden. No larger or more central embolus. There is some enlargement of the main pulmonary arteries. Please correlate for pulmonary artery hypertension. This has seen previously. Enlarged heart. Increasing parenchymal bilateral lung opacities identified with some subtle nodularity, ground-glass and interstitial septal thickening. Few presumed reactive nodes. Recommend follow up to confirm resolution. Persistent tiny pleural effusions, left-greater-than-right. Aortic Atherosclerosis (ICD10-I70.0). Electronically Signed   By: Karen Kays M.D.   On: 03/28/2023 16:42   CT HEAD WO CONTRAST ( )  Result Date: 03/28/2023 CLINICAL DATA:  Mental status change, unknown cause. EXAM: CT HEAD WITHOUT CONTRAST TECHNIQUE: Contiguous axial images were obtained from the base of the skull through the vertex without intravenous contrast. RADIATION DOSE REDUCTION: This exam was performed according to the departmental dose-optimization program which includes automated exposure control, adjustment of the mA and/or kV according to patient size and/or use of iterative reconstruction technique. COMPARISON:  None Available. FINDINGS: Brain: There is no evidence of an acute infarct, intracranial hemorrhage, mass, midline shift, or extra-axial fluid collection. Mild cerebral atrophy is within normal limits for age. Hypodensities in the cerebral white matter nonspecific but compatible with mild chronic small vessel ischemic disease. Vascular: Calcified atherosclerosis at the skull base. No hyperdense vessel. Skull: No acute fracture or suspicious osseous lesion. Sinuses/Orbits: Chronic left sphenoid sinusitis with near complete opacification. Mild mucosal thickening in the paranasal sinuses elsewhere. Clear mastoid air  cells. Bilateral cataract extraction. Other: None. IMPRESSION: 1. No evidence of acute intracranial abnormality. 2. Mild chronic small vessel ischemic disease. Electronically Signed   By: Freida Busman  Mosetta Putt M.D.   On: 03/28/2023 15:52   DG Chest Portable 1 View  Result Date: 03/28/2023 CLINICAL DATA:  Shortness of breath EXAM: PORTABLE CHEST - 1 VIEW COMPARISON:  02/25/2023 FINDINGS: Unchanged borderline cardiomegaly. No significant pulmonary vascular congestion. Unchanged mild left basilar opacity favored to be atelectasis. Reversed left total shoulder prosthesis again seen. IMPRESSION: 1. Unchanged mild cardiomegaly. 2. Unchanged left basilar opacity likely due to atelectasis. Electronically Signed   By: Acquanetta Belling M.D.   On: 03/28/2023 11:42    ECHO 01/2023: 1. Left ventricular ejection fraction, by estimation, is 60 to 65%. The left ventricle has normal function. The left ventricle has no regional wall motion abnormalities. There is moderate left ventricular hypertrophy. Indeterminate diastolic filling due  to E-A fusion.   2. Right ventricular systolic function is normal. The right ventricular size is normal.   3. Left atrial size was moderately dilated.   4. Right atrial size was mildly dilated.   5. The mitral valve is normal in structure. Mild mitral valve  regurgitation. No evidence of mitral stenosis.   6. The aortic valve is normal in structure. Aortic valve regurgitation is  not visualized. Mild aortic valve stenosis.   7. The inferior vena cava is normal in size with greater than 50%  respiratory variability, suggesting right atrial pressure of 3 mmHg.   TELEMETRY reviewed by me 04/01/2023: atrial fibrillation rate 80s  EKG reviewed by me: atrial fibrillation RVR rate 118 bom  Data reviewed by me 04/01/2023: last 24h vitals tele labs imaging I/O hospitalist progress note, ENT notes  Principal Problem:   Acute respiratory failure with hypoxia (HCC) Active Problems:   Chronic kidney  disease, stage 3a (HCC)   DM (diabetes mellitus) (HCC)   HTN (hypertension)   Acute hypoxic respiratory failure (HCC)   Atrial fibrillation with RVR (HCC)   GERD (gastroesophageal reflux disease)   Acute heart failure with preserved ejection fraction (HFpEF) (HCC)   Encephalopathy    ASSESSMENT AND PLAN:  Samantha Freeman is a 87 y.o. female  with a past medical history of  mild aortic stenosis, bradycardia, type 2 diabetes, hypercholesterolemia  who presented to the ED on 03/28/2023 for shortness of breath, fatigue. Cardiology was consulted for further evaluation.   # Acute hypoxic respiratory failure # Acute on chronic HFpEF # Acute pulmonary emboli Patient with worsening SOB and fatigue over the last few days, brought to ED yesterday. Initially required BiPAP due to hypoxia now on 8L HFNC. BNP elevated at 661. Troponins negative x2 at 9 > 9. CXR without significant pulmonary edema. CTA chest with few bilateral segmental and small PE, mild clot burden. -Continue IV heparin. Patient with nosebleeds on DOAC before and now with heparin, has undergone cauterization in the past. Given she will need long term anticoagulation if she agrees (has declined in the past) would consider ENT evaluation.  -Start lasix 20 mg daily.  -Further management of PE per primary.   # Persistent atrial fibrillation # Atrial fibrillation with rapid ventricular response Patient with hx of AF, multiple recent hospitalizations with difficult to control rate. Most recently was placed on metoprolol, diltiazem, and digoxin for rate control. Dig level reported at 2.6 but this was corrected to 0.2 per lab.  -Consider restarting digoxin tomorrow pending rate.  -Discontinue diltiazem infusion. Continue diltiazem 90 mg q6 hours, plan to consolidate tomorrow. Continue metoprolol 50 mg twice daily.  -Continue heparin, would benefit from DOAC which has been discussed in detail with patient in the  past but she has declined. -Not a  candidate for cardioversion as she would need TEE prior but given oxygen requirement this would be higher risk for complication. If she is amenable to discharging home on anticoagulation then could consider DCCV or antiarrhythmic medication in future.  # Hyperlipidemia -Continue rosuvastatin 10 mg daily and aspirin 81 mg daily.    This patient's plan of care was discussed and created with Dr. Juliann Pares and he is in agreement.  Signed: Gale Journey, PA-C  04/01/2023, 11:23 AM [facility]

## 2023-04-01 NOTE — H&P (View-Only) (Signed)
Hospital Consult    Reason for Consult:  Uncontrolled Epistaxis  Requesting Physician:  Dr Lenis Noon MD MRN #:  [id number]  History of Present Illness: This is a 87 y.o. female  who presented to [facility] emergency department with increased work of breathing which started 2 days prior. Patient has a history significant for atrial fibrillation not on any anticoagulation due to epistaxis. She was found to be in A-Fib with RVR with new bilateral small pulmonary emboli and started on anticoagulation. Since then she has developed epistaxis. Seen by ENT and nasal packed which she was unable to tolerate so they were removed. Cauterized but unsuccessful. Vascular Surgery was now consulted to embolize nasal epistaxis as patient will need to be on anticoagulation for her chronic Atrial fibrillation.   Past Medical History:  Diagnosis Date   Anemia    Anxiety    Aortic stenosis, mild    Arthritis    CKD (chronic kidney disease), stage III (HCC)    Complication of anesthesia    Propofol anaphylaxis   Cortical cataract    DDD (degenerative disc disease), lumbar    Depression    Dyspnea    GERD (gastroesophageal reflux disease)    Headache    Heart murmur    History of 2019 novel coronavirus disease (COVID-19) 12/01/2018   HLD (hyperlipidemia)    Hypertension    Pneumonia    Schatzki's ring    Seasonal allergies    T2DM (type 2 diabetes mellitus) (HCC)    Valvular insufficiency     Past Surgical History:  Procedure Laterality Date   ABDOMINAL HYSTERECTOMY     APPENDECTOMY     BICEPT TENODESIS Right 05/13/2018   Procedure: BICEPS TENODESIS;  Surgeon: Christena Flake, MD;  Location: [facility] ORS;  Service: Orthopedics;  Laterality: Right;   CARDIAC CATHETERIZATION     COLONOSCOPY     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   EYE SURGERY Right 09/2013   Corneal transplant   JOINT REPLACEMENT Right 2015   knee   REVERSE SHOULDER ARTHROPLASTY Left 10/13/2020   Procedure: REVERSE SHOULDER  ARTHROPLASTY;  Surgeon: Christena Flake, MD;  Location: [facility] ORS;  Service: Orthopedics;  Laterality: Left;   SHOULDER ARTHROSCOPY WITH ROTATOR CUFF REPAIR Right 05/13/2018   Procedure: SHOULDER ARTHROSCOPY WITH DEBRIDEMENT, DECOMPRESSION AND ROTATOR CUFF REPAIR;  Surgeon: Christena Flake, MD;  Location: [facility] ORS;  Service: Orthopedics;  Laterality: Right;   TONSILLECTOMY      Allergies  Allergen Reactions   Buspirone     Other Reaction(s): Dizziness   Propofol Anaphylaxis   Zolpidem Other (See Comments)    Hallucinations   Cholestyramine Itching and Rash   Codeine Nausea Only   Hydrochlorothiazide Other (See Comments)    PATIENT DOES NOT REMEMBER   Loratadine Other (See Comments)    PATIENT DOES NOT REMEMBER   Niacin Rash    Prior to Admission medications   Medication Sig Start Date End Date Taking? Authorizing Provider  ALPRAZolam Prudy Feeler) 0.5 MG tablet Take 0.5 mg by mouth at bedtime as needed for anxiety or sleep.   Yes [provider]  aspirin EC 81 MG tablet Take 1 tablet (81 mg total) by mouth daily. Swallow whole. 03/07/23 04/06/23 Yes Charise Killian, MD  Calcium Carb-Cholecalciferol (CALCIUM 600/VITAMIN D3 PO) Take 1 tablet by mouth daily.   Yes [provider]  Coenzyme Q10 10 MG capsule Take 10 mg by mouth every morning.   Yes [provider]  dapagliflozin propanediol (FARXIGA) 10 MG TABS tablet Take 10 mg by mouth daily. 11/03/20  Yes [provider]  digoxin (LANOXIN) 0.125 MG tablet Take 1 tablet (0.125 mg total) by mouth every other day. 03/14/23 04/13/23 Yes Charise Killian, MD  diltiazem (CARDIZEM CD) 180 MG 24 hr capsule Take 1 capsule (180 mg total) by mouth daily. 03/13/23 04/12/23 Yes Charise Killian, MD  fenofibrate (TRICOR) 48 MG tablet Take 1 tablet by mouth daily. 12/23/22  Yes [provider]  furosemide (LASIX) 20 MG tablet Take 20 mg by mouth daily as needed for fluid or edema. 05/22/22 12/31/23 Yes [provider]  gabapentin (NEURONTIN) 100 MG capsule Take 200 mg by mouth 3 (three) times daily.   Yes [provider]  hydrOXYzine (ATARAX) 25 MG tablet Take 1 tablet by mouth 2 (two) times daily. 02/16/21  Yes [provider]  insulin lispro (HUMALOG) 100 UNIT/ML injection Inject 7-19 Units into the skin 3 (three) times daily before meals. Blood Glucose level: 140-199 - 7 units, 200-250 - 9 units, 251-299 - 13 units,  300-349 - 17 units,  350 or above 19 units.   Yes [provider]  LANTUS SOLOSTAR 100 UNIT/ML Solostar Pen Inject 36 Units into the skin at bedtime. 02/22/23  Yes [provider]  magnesium oxide (MAG-OX) 400 MG tablet Take 800 mg by mouth 3 (three) times daily. 05/18/13  Yes [provider]  metoprolol tartrate (LOPRESSOR) 50 MG tablet Take 1 tablet (50 mg total) by mouth 2 (two) times daily. 03/12/23 04/11/23 Yes Charise Killian, MD  Multiple Vitamin (MULTIVITAMIN) tablet Take 1 tablet by mouth daily. Women's Daily Multivitamin   Yes [provider]  pantoprazole (PROTONIX) 40 MG tablet Take 1 tablet (40 mg total) by mouth daily. 02/05/23  Yes Jonah Blue, MD  PARoxetine (PAXIL) 10 MG tablet Take 10 mg by mouth daily. 02/22/23  Yes [provider]  prednisoLONE acetate (PRED FORTE) 1 % ophthalmic suspension Place 1 drop into both eyes at bedtime. 10/04/17  Yes [provider]  rOPINIRole (REQUIP) 1 MG tablet Take 1 mg by mouth at bedtime. 03/26/23  Yes [provider]  rosuvastatin (CRESTOR) 10 MG tablet Take 10 mg by mouth daily.   Yes [provider]  vitamin B-12 (CYANOCOBALAMIN) 1000 MCG tablet Take 1,000 mcg by mouth daily.   Yes [provider]  blood glucose meter kit and supplies KIT Dispense based on patient and insurance preference. Use up to four times daily as directed. (FOR ICD-9 250.00, 250.01). For QAC - HS accuchecks. 12/11/18   Leroy Sea, MD  ferrous sulfate  325 (65 FE) MG tablet Take 325 mg by mouth daily with breakfast. Patient not taking: Reported on 02/25/2023    [provider]  glucose blood (FREESTYLE LITE) test strip For glucose testing every before meals at bedtime. Diagnosis E 11.65  Can substitute to any accepted brand 12/11/18   Leroy Sea, MD  Insulin Syringe-Needle U-100 25G X 1" 1 ML MISC For 4 times a day insulin SQ, 1 month supply. Diagnosis E11.65 12/11/18   Leroy Sea, MD  traZODone (DESYREL) 50 MG tablet Take 50 mg by mouth at bedtime. Patient not taking: Reported on 03/28/2023    [provider]  apixaban (ELIQUIS) 5 MG TABS tablet Take 1 tablet (5 mg total) by mouth every 12 (twelve) hours. 02/01/23 02/22/23      Social History   Socioeconomic History   Marital  status: Widowed    Spouse name: Not on file   Number of children: Not on file   Years of education: Not on file   Highest education level: Not on file  Occupational History   Not on file  Tobacco Use   Smoking status: Never   Smokeless tobacco: Never  Vaping Use   Vaping status: Never Used  Substance and Sexual Activity   Alcohol use: Never   Drug use: Never   Sexual activity: Not Currently  Other Topics Concern   Not on file  Social History Narrative   Lives alone, has support from son who is a Optician, dispensing and daughter in Social worker. Will be with mother when she has surgery.   Social Determinants of Health   Financial Resource Strain: Not on file  Food Insecurity: No Food Insecurity (03/28/2023)   Hunger Vital Sign    Worried About Running Out of Food in the Last Year: Never true    Ran Out of Food in the Last Year: Never true  Transportation Needs: No Transportation Needs (03/28/2023)   PRAPARE - Administrator, Civil Service (Medical): No    Lack of Transportation (Non-Medical): No  Physical Activity: Not on file  Stress: Not on file  Social Connections: Not on file  Intimate Partner Violence: Not At Risk  (03/28/2023)   Humiliation, Afraid, Rape, and Kick questionnaire    Fear of Current or Ex-Partner: No    Emotionally Abused: No    Physically Abused: No    Sexually Abused: No     Family History  Problem Relation Age of Onset   Breast cancer Paternal Aunt     ROS: Otherwise negative unless mentioned in HPI  Physical Examination  Vitals:   04/01/23 0833 04/01/23 1135  BP: 122/88 138/74  Pulse: 64 73  Resp: 16 16  Temp: 98.2 F (36.8 C) 97.8 F (36.6 C)  SpO2: 94%    Body mass index is 30.65 kg/m.  General:  WDWN in NAD Gait: Not observed HENT: WNL, normocephalic Pulmonary: normal non-labored breathing, without Rales, rhonchi,  wheezing Cardiac: irregular, Hx A-Fib with RVR, without  Murmurs, rubs or gallops; without carotid bruits Abdomen: Positive bowel sounds, soft, NT/ND, no masses Skin: without rashes Vascular Exam/Pulses: Weak palpable pulses throughout Extremities: without ischemic changes, without Gangrene , without cellulitis; without open wounds;  Musculoskeletal: no muscle wasting or atrophy  Neurologic: A&O X 3;  No focal weakness or paresthesias are detected; speech is fluent/normal Psychiatric:  The pt has Normal affect. Lymph:  Unremarkable  CBC    Component Value Date/Time   WBC 6.5 04/01/2023 0604   RBC 4.68 04/01/2023 0604   HGB 13.7 04/01/2023 0604   HGB 11.9 (L) 10/07/2012 1403   HCT 41.3 04/01/2023 0604   HCT 35.1 10/07/2012 1403   PLT 226 04/01/2023 0604   PLT 281 10/07/2012 1403   MCV 88.2 04/01/2023 0604   MCV 87 10/07/2012 1403   MCH 29.3 04/01/2023 0604   MCHC 33.2 04/01/2023 0604   RDW 14.7 04/01/2023 0604   RDW 15.4 (H) 10/07/2012 1403   LYMPHSABS 1.8 03/28/2023 1038   LYMPHSABS 2.4 10/07/2012 1403   MONOABS 1.1 (H) 03/28/2023 1038   MONOABS 0.5 10/07/2012 1403   EOSABS 0.1 03/28/2023 1038   EOSABS 0.2 10/07/2012 1403   BASOSABS 0.1 03/28/2023 1038   BASOSABS 0.0 10/07/2012 1403    BMET    Component Value Date/Time    NA 132 (L) 04/01/2023 0604  NA 140 02/15/2012 0348   K 4.0 04/01/2023 0604   K 3.7 02/15/2012 0348   CL 98 04/01/2023 0604   CL 105 02/15/2012 0348   CO2 24 04/01/2023 0604   CO2 28 02/15/2012 0348   GLUCOSE 296 (H) 04/01/2023 0604   GLUCOSE 207 (H) 02/15/2012 0348   BUN 25 (H) 04/01/2023 0604   BUN 14 02/15/2012 0348   CREATININE 0.91 04/01/2023 0604   CREATININE 0.87 02/15/2012 0348   CALCIUM 9.1 04/01/2023 0604   CALCIUM 8.6 02/15/2012 0348   GFRNONAA >60 04/01/2023 0604   GFRNONAA >60 02/15/2012 0348   GFRAA 55 (L) 09/15/2019 1353   GFRAA >60 02/15/2012 0348    COAGS: Lab Results  Component Value Date   INR 1.2 03/28/2023   INR 1.1 01/30/2023   INR 1.0 09/15/2019     Non-Invasive Vascular Imaging:   EXAM:03/28/23 CT ANGIOGRAPHY CHEST WITH CONTRAST   TECHNIQUE: Multidetector CT imaging of the chest was performed using the standard protocol during bolus administration of intravenous contrast. Multiplanar CT image reconstructions and MIPs were obtained to evaluate the vascular anatomy.   RADIATION DOSE REDUCTION: This exam was performed according to the departmental dose-optimization program which includes automated exposure control, adjustment of the mA and/or kV according to patient size and/or use of iterative reconstruction technique.   CONTRAST:  75mL OMNIPAQUE IOHEXOL 350 MG/ML SOLN   COMPARISON:  CT angiogram 02/25/2023.  X-ray 03/28/2023 and older.   FINDINGS: Cardiovascular: Heart is enlarged. Trace pericardial fluid. Coronary artery calcifications are seen. Minimal contrast opacification along the thoracic aorta. Grossly normal course and caliber with vascular calcifications. There is some dilatation of the main pulmonary artery. Please correlate for pulmonary artery hypertension. There is a pulmonary embolism identified in the upper aspect of the middle lobe as seen on series 6, image 213. This has not seen on the previous examination. There is  also embolus along the lingula on series 6, image 180, not seen previously. Overall mild clot burden. No larger or more central embolus. Some limited evaluation related to motion.   Mediastinum/Nodes: Patulous esophagus. Heterogeneous thyroid gland. There are several prominent nodes identified in the mediastinum and hilum bilaterally, similar to previous. No axillary nodes.   Lungs/Pleura: Trace pleural fluid with the adjacent parenchymal opacities. The opacities are increasing from previous at the lower lobes. There are also some dependent areas in the upper lobes. There is some new nodules identified as well in the left upper lobe measuring 10 mm on series 5, image 38. With the time course favor a infectious or inflammatory process as this has not seen previously. There are areas of bronchial wall thickening, mucous plugging and interstitial septal thickening. Few areas of ground-glass as well.   Upper Abdomen: The adrenal glands are preserved in the upper abdomen. Stomach is relatively collapsed punctate nonobstructing right-sided renal stone at the edge of the imaging field.   Musculoskeletal: Surgical changes about the left shoulder. Scattered degenerative changes along the spine.   Review of the MIP images confirms the above findings.   Critical Value/emergent results were called by telephone at the time of interpretation on 03/28/2023 at 4:21 pm EST to provider Dr. Alvester Morin, who verbally acknowledged these results.   IMPRESSION: New bilateral few segmental and smaller pulmonary emboli. Mild clot burden. No larger or more central embolus.   There is some enlargement of the main pulmonary arteries. Please correlate for pulmonary artery hypertension. This has seen previously.   Enlarged heart.   Increasing parenchymal  bilateral lung opacities identified with some subtle nodularity, ground-glass and interstitial septal thickening. Few presumed reactive nodes. Recommend  follow up to confirm resolution.   Persistent tiny pleural effusions, left-greater-than-right.   Aortic Atherosclerosis (ICD10-I70.0).  Statin:  Yes.   Beta Blocker:  Yes.   Aspirin:  Yes.   ACEI:  No. ARB:  No. CCB use:  Yes Other antiplatelets/anticoagulants:  Yes.  Eliquis 5 mg BID  Patient NOT TAKING ELIQUIS AND ASA DUE TO EPISTAXIS NOW.    ASSESSMENT/PLAN: This is a 87 y.o. female who presents to [facility] emergency department with 1 to 2 days of worsening respiratory difficulties.  Patient was found to be in atrial fibrillation with RVR.  Patient was not anticoagulated due to history of epistaxis.  Due to the severity of the patient's atrial fibrillation patient was placed on anticoagulation and 2 days later developed epistaxis.  She was seen by ENT for packing and cauterization which were unsuccessful.  Vascular surgery consulted to embolize nasal epistaxis.* No surgery date entered *   PLAN: Vascular surgery plans on taking the patient to the vascular lab tomorrow 04/02/2023 for nasal embolization of epistaxis/hemorrhage.  I had a long detailed discussion at the bedside with the patient concerning the procedure, benefits, risk, and complications.  Patient verbalizes her understanding and wishes to proceed as soon as possible.  I answered all the patient's questions today.  Patient will be made n.p.o. after midnight for procedure tomorrow.   I discussed the procedure in detail with Dr. Levora Dredge MD and he is in agreement with the plan.   DVT prophylaxis -Heparin infusion    [name] 04/01/2023 2:45 PM

## 2023-04-01 NOTE — Progress Notes (Signed)
Physical Therapy Treatment Patient Details Name: Samantha Freeman MRN: [id number] DOB: 1935-07-11 Today's Date: 04/01/2023   History of Present Illness Pt is a 87 y.o. female admitted with AFIB with RVR, acute hypoxic respiratory failure, acute heart failure, & encephalopathy. PMH significant for atrial fibrillation not on AC, HFpEF, type 2 diabetes, hypertension, hyperlipidemia, CKD stage IIIa    PT Comments  Patient is agreeable to PT session. The patient was agreeable to get to the chair but declined further mobility. Sp02 90-93% on 6 L02 with getting out bed, taking a few steps to chair using rolling walker. Min A required for standing. Cues for safety with mobility and for breathing techniques (although patient reports she cannot breath well through her nose). Recommend to continue PT to maximize independence and facilitate return to prior level of function. Consider rehabilitation < 3 hours/day after this hospital stay if patient is unable to return home after this hospital stay. Anticipate she will need some assistance with mobility at discharge.    If plan is discharge home, recommend the following: A little help with walking and/or transfers;A little help with bathing/dressing/bathroom;Assistance with cooking/housework;Assist for transportation   Can travel by private vehicle     No  Equipment Recommendations  None recommended by PT    Recommendations for Other Services       Precautions / Restrictions Precautions Precautions: Fall Restrictions Weight Bearing Restrictions: No     Mobility  Bed Mobility Overal bed mobility: Needs Assistance Bed Mobility: Supine to Sit     Supine to sit: Contact guard     General bed mobility comments: increased time. cues for technique    Transfers Overall transfer level: Needs assistance Equipment used: Rolling walker (2 wheels) Transfers: Sit to/from Stand Sit to Stand: Min assist           General transfer comment: lifting  assistance required for standing.    Ambulation/Gait Ambulation/Gait assistance: Contact guard assist Gait Distance (Feet): 3 Feet Assistive device: Rolling walker (2 wheels) Gait Pattern/deviations: Decreased stride length Gait velocity: decreased     General Gait Details: patient is able to take several steps from bed to chair with CGA for safety. no loss of balance. Sp02 decresed briefly to 90% on 6 L02. patient declined walking further today. cues for breathing techniques, however patient reports she has difficulty getting air in her nose at this time   Stairs             Wheelchair Mobility     Tilt Bed    Modified Rankin (Stroke Patients Only)       Balance Overall balance assessment: Needs assistance Sitting-balance support: Feet supported Sitting balance-Leahy Scale: Good     Standing balance support: During functional activity, Bilateral upper extremity supported Standing balance-Leahy Scale: Fair                              Cognition Arousal: Alert Behavior During Therapy: WFL for tasks assessed/performed Overall Cognitive Status: Within Functional Limits for tasks assessed                                          Exercises      General Comments        Pertinent Vitals/Pain Pain Assessment Pain Assessment: No/denies pain    Home Living  Prior Function            PT Goals (current goals can now be found in the care plan section) Acute Rehab PT Goals Patient Stated Goal: to get out of the hospital PT Goal Formulation: With patient Time For Goal Achievement: 04/13/23 Potential to Achieve Goals: Good Progress towards PT goals: Progressing toward goals    Frequency    Min 1X/week      PT Plan      Co-evaluation              AM-PAC PT "6 Clicks" Mobility   Outcome Measure  Help needed turning from your back to your side while in a flat bed without using  bedrails?: A Little Help needed moving from lying on your back to sitting on the side of a flat bed without using bedrails?: A Little Help needed moving to and from a bed to a chair (including a wheelchair)?: A Little Help needed standing up from a chair using your arms (e.g., wheelchair or bedside chair)?: A Little Help needed to walk in hospital room?: A Little Help needed climbing 3-5 steps with a railing? : A Little 6 Click Score: 18    End of Session Equipment Utilized During Treatment: Oxygen Activity Tolerance: Patient tolerated treatment well Patient left: in chair;with call bell/phone within reach Nurse Communication: Mobility status PT Visit Diagnosis: Muscle weakness (generalized) (M62.81);Difficulty in walking, not elsewhere classified (R26.2)     Time: 1610-9604 PT Time Calculation (min) (ACUTE ONLY): 16 min  Charges:    $Therapeutic Activity: 8-22 mins PT General Charges $$ ACUTE PT VISIT: 1 Visit                     Donna Bernard, PT, MPT    Ina Homes 04/01/2023, 11:26 AM

## 2023-04-01 NOTE — Consult Note (Signed)
PHARMACY - ANTICOAGULATION CONSULT NOTE  Pharmacy Consult for Heparin Indication: pulmonary embolus  Allergies  Allergen Reactions   Buspirone     Other Reaction(s): Dizziness   Propofol Anaphylaxis   Zolpidem Other (See Comments)    Hallucinations   Cholestyramine Itching and Rash   Codeine Nausea Only   Hydrochlorothiazide Other (See Comments)    PATIENT DOES NOT REMEMBER   Loratadine Other (See Comments)    PATIENT DOES NOT REMEMBER   Niacin Rash   Patient Measurements: Height: 5\' 4"  (162.6 cm) Weight: 81 kg (178 lb 9.2 oz) IBW/kg (Calculated) : 54.7 Heparin Dosing Weight: 73.7 kg   Vital Signs: Temp: 98 F (36.7 C) (12/09 0400) Temp Source: Oral (12/09 0400) BP: 145/95 (12/09 0651) Pulse Rate: 92 (12/09 0400)  Labs: Recent Labs    03/30/23 0637 03/31/23 0622 04/01/23 0604  HGB 13.8 14.4 13.7  HCT 42.1 43.2 41.3  PLT 219 234 226  HEPARINUNFRC 0.58 0.43 0.47  CREATININE 1.12* 0.98 0.91   Estimated Creatinine Clearance: 46.5 mL/min (by C-G formula based on SCr of 0.91 mg/dL).  Medical History: Past Medical History:  Diagnosis Date   Anemia    Anxiety    Aortic stenosis, mild    Arthritis    CKD (chronic kidney disease), stage III (HCC)    Complication of anesthesia    Propofol anaphylaxis   Cortical cataract    DDD (degenerative disc disease), lumbar    Depression    Dyspnea    GERD (gastroesophageal reflux disease)    Headache    Heart murmur    History of 2019 novel coronavirus disease (COVID-19) 12/01/2018   HLD (hyperlipidemia)    Hypertension    Pneumonia    Schatzki's ring    Seasonal allergies    T2DM (type 2 diabetes mellitus) (HCC)    Valvular insufficiency    Medications:  Patient was taking apixaban previously but it was held due to epistaxis - most recently filled in October; per patient she stopped taking in October   Assessment: Samantha Freeman is an 87 year old female that presented with difficulty breathing. PMH is  significant for atrial fibrillation not on AC, HFpEF, type 2 diabetes, hypertension, hyperlipidemia, CKD stage IIIa, depression/anxiety presented acute respiratory failure hypoxia, A-fib with RVR, and acute on chronic HFpEF. From chart review, patient was taking apixaban previously but it was held due to epistaxis. CTA of the chest notable for bilateral small to moderate PE. Pharmacy has been consulted for initiation and management of a heparin infusion. Baseline labs: Hgb 14.6, PLT 210, aPTT and PT/INR ordered.   Goal of Therapy:  Heparin level 0.3 - 0.7  Monitor platelets by anticoagulation protocol: Yes  12/6@0202 : HL=0.66, therapeutic X 1@1250  units/hr 12/6@0954 : HL=0.74, supratherapeutic@1250  units/hr 12/6@2018 : HL=0.62, Therapeutic x 1 12/7@0637 : HL=0.58, Therapeutic X 2  12/8@0622 : HL=0.43, therapeutic x3 12/9@0604 : HL=0.47, therapeutic X 4    Plan:  - continue heparin infusion at 1150 units/hr - recheck HL with AM labs - Monitor CBC and HL daily   Riann Oman D, PharmD 04/01/2023 7:14 AM

## 2023-04-01 NOTE — Progress Notes (Signed)
PROGRESS NOTE    Samantha Freeman  ZOX:[id number] DOB: 1935/05/11 DOA: 03/28/2023 PCP: Marguarite Arbour, MD  [id number]  LOS: 4 days   Brief hospital course:   Assessment & Plan: Samantha Freeman is a 86 y.o. female with medical history significant of atrial fibrillation not on AC, HFpEF, type 2 diabetes, hypertension, hyperlipidemia, CKD stage IIIa, depression/anxiety presented acute respiratory failure hypoxia, A-fib with RVR, acute on chronic HFpEF.  Patient reports increased work of breathing over the past 1 to 2 days.    Atrial fibrillation with RVR (HCC) --3 hospitalizations in 2 months for the same.  Most recently was placed on metoprolol, diltiazem, and digoxin for rate control.  Not on anticoagulation due to hx/o epistaxis.   --started on dilt gtt, [facility] cardiology consulted. Plan: --wean off dilt gtt --cont oral dilt 90 mg q8h --cont metop 50 BID --cont heparin gtt  Acute hypoxic respiratory failure (HCC) Decompensated respiratory failure requiring initially BiPAP and now transition to high flow nasal cannula 8L. --in the setting A-fib with RVR and acute on chronic HFpEF and PE. --O2 requirement slowly improving Plan: --treat underlying causes --Continue supplemental O2 to keep sats >=92%, wean as tolerated  Acute PE --New bilateral few segmental and smaller pulmonary emboli.  --cont heparin gtt  Acute heart failure with preserved ejection fraction (HFpEF) (HCC) 2D echo November 2024 with EF of 60 to 65% BNP 600s with positive cardiomegaly on chest x-ray 1-2+ pitting edema bilaterally --s/p IV lasix 40 x1 in ED and IV lasix 20 x1 per cardio Plan: --further diuresis per cardio  Recurrent epistaxis --in response to anticoagulation.  Per ENT, has been "packed cauterized 3x by [place] in last 2 months." --ENT consulted, pt repeated stressed "she would rather die than have nasal packing."   Plan: --If bleeds, recommend pressure with clamp, Afrin, humidification.   --embolization tomorrow  Encephalopathy Reported patient with transient episode of hallucination Appears to be alert at the bedside Will monitor for now CT head pending Add on VBG in the setting of hypoxia As needed Zyprexa for agitation Monitor  GERD (gastroesophageal reflux disease) PPI    HTN (hypertension) --on rate control agents as above  DM (diabetes mellitus) (HCC) --increase glargine to 25u nightly --add mealtime 4u TID --ACHS and SSI   Chronic kidney disease, stage 3a (HCC) Cr 1.3 w/ GFR in 40s  Looks to be near baseline  Monitor    DVT prophylaxis: [id number] gtt Code Status: DNR  Family Communication:  Level of care: Progressive Dispo:   The patient is from: home Anticipated d/c is to: home Anticipated d/c date is: to be determined    Subjective and Interval History:  HR better controlled today.  Pt had new complaints.   Objective: Vitals:   04/01/23 0724 04/01/23 0833 04/01/23 1135 04/01/23 1630  BP:  122/88 138/74 139/85  Pulse:  64 73 74  Resp:  16 16 15   Temp:  98.2 F (36.8 C) 97.8 F (36.6 C) 98 F (36.7 C)  TempSrc:      SpO2: 95% 94%  92%  Weight:      Height:        Intake/Output Summary (Last 24 hours) at 04/01/2023 1834 Last data filed at 04/01/2023 1748 Gross per 24 hour  Intake 966.84 ml  Output 1600 ml  Net -633.16 ml   Filed Weights   03/28/23 1029 03/28/23 2145  Weight: 86.1 kg 81 kg    Examination:   Constitutional: NAD, AAOx3 HEENT: conjunctivae and lids normal,  EOMI CV: No cyanosis.   RESP: normal respiratory effort, on 6L Neuro: II - XII grossly intact.   Psych: Normal mood and affect.  Appropriate judgement and reason   Data Reviewed: I have personally reviewed labs and imaging studies  Time spent: 35 minutes  Darlin Priestly, MD [facility] If 7PM-7AM, please contact night-coverage 04/01/2023, 6:34 PM

## 2023-04-01 NOTE — Progress Notes (Signed)
..04/01/2023 8:15 AM  Kerrin Champagne [id number]    Temp:  [98 F (36.7 C)-98.5 F (36.9 C)] 98 F (36.7 C) (12/09 0400) Pulse Rate:  [65-92] 92 (12/09 0400) Resp:  [16-27] 18 (12/09 0400) BP: (124-145)/(64-95) 145/95 (12/09 0651) SpO2:  [91 %-95 %] 95 % (12/09 0724),     Intake/Output Summary (Last 24 hours) at 04/01/2023 0815 Last data filed at 04/01/2023 0700 Gross per 24 hour  Intake 1811.72 ml  Output 500 ml  Net 1311.72 ml    Results for orders placed or performed during the hospital encounter of 03/28/23 (from the past 24 hour(s))  Glucose, capillary     Status: Abnormal   Collection Time: 03/31/23  9:38 AM  Result Value Ref Range   Glucose-Capillary 303 (H) 70 - 99 mg/dL  Glucose, capillary     Status: Abnormal   Collection Time: 03/31/23 12:30 PM  Result Value Ref Range   Glucose-Capillary 337 (H) 70 - 99 mg/dL  Glucose, capillary     Status: Abnormal   Collection Time: 03/31/23  5:46 PM  Result Value Ref Range   Glucose-Capillary 352 (H) 70 - 99 mg/dL  Glucose, capillary     Status: Abnormal   Collection Time: 03/31/23  9:07 PM  Result Value Ref Range   Glucose-Capillary 363 (H) 70 - 99 mg/dL  Basic metabolic panel     Status: Abnormal   Collection Time: 04/01/23  6:04 AM  Result Value Ref Range   Sodium 132 (L) 135 - 145 mmol/L   Potassium 4.0 3.5 - 5.1 mmol/L   Chloride 98 98 - 111 mmol/L   CO2 24 22 - 32 mmol/L   Glucose, Bld 296 (H) 70 - 99 mg/dL   BUN 25 (H) 8 - 23 mg/dL   Creatinine, Ser 4.09 0.44 - 1.00 mg/dL   Calcium 9.1 8.9 - 81.1 mg/dL   GFR, Estimated >91 >47 mL/min   Anion gap 10 5 - 15  CBC     Status: None   Collection Time: 04/01/23  6:04 AM  Result Value Ref Range   WBC 6.5 4.0 - 10.5 K/uL   RBC 4.68 3.87 - 5.11 MIL/uL   Hemoglobin 13.7 12.0 - 15.0 g/dL   HCT 82.9 56.2 - 13.0 %   MCV 88.2 80.0 - 100.0 fL   MCH 29.3 26.0 - 34.0 pg   MCHC 33.2 30.0 - 36.0 g/dL   RDW 86.5 78.4 - 69.6 %   Platelets 226 150 - 400 K/uL   nRBC 0.0  0.0 - 0.2 %  Magnesium     Status: Abnormal   Collection Time: 04/01/23  6:04 AM  Result Value Ref Range   Magnesium 1.6 (L) 1.7 - 2.4 mg/dL  Heparin level (unfractionated)     Status: None   Collection Time: 04/01/23  6:04 AM  Result Value Ref Range   Heparin Unfractionated 0.47 0.30 - 0.70 IU/mL    SUBJECTIVE:  No acute events.  Continues to be on anticoagulation for PE and Afib.  No significant bleeding but some slight oozing from left nares  OBJECTIVE:  GEN-  NAD NOSE-  right nostril with extensive crusting along septum but airway patnent.  Left nostril with dried blood filling majority of cavity.  Hard and crusted  IMPRESSION:  Recurrent epistaxis with recurrent bleeding while on anticoagulation with need for anticoagulation due to PE/Afib  PLAN:  Initially was seen in May 2024 for recurrent epistaxis and cauterized then recurrent in 01/2023 and was cauterized  and packed.  Returned again when anticoagulation was restarted after patient stopped in October 2024.  Right side is worse than left.  NPO this morning in case Vascular surgery feels embolization is an option for helping control her recurrent bleeding.  Patient refusing packing/endoscopic cautery in OR as says she would rather die than do that.  Continue moisturization with saline/gel/ointment.  Treat bleeding prn with Afrin/pressure.  Virdell Hoiland 04/01/2023, 8:15 AM

## 2023-04-01 NOTE — Inpatient Diabetes Management (Signed)
Inpatient Diabetes Program Recommendations  AACE/ADA: New Consensus Statement on Inpatient Glycemic Control (2015)  Target Ranges:  Prepandial:   less than 140 mg/dL      Peak postprandial:   less than 180 mg/dL (1-2 hours)      Critically ill patients:  140 - 180 mg/dL   Lab Results  Component Value Date   GLUCAP 321 (H) 04/01/2023   HGBA1C 7.6 (H) 01/31/2023    Latest Reference Range & Units 03/31/23 09:38 03/31/23 12:30 03/31/23 17:46 03/31/23 21:07 04/01/23 08:36  Glucose-Capillary 70 - 99 mg/dL 161 (H) 096 (H) 045 (H) 363 (H) 321 (H)  (H): Data is abnormally high  Review of Glycemic Control  Diabetes history: DM2 Home DM Meds: Lantus 36 units at bedtime     Humalog 7-19 units TID     Farxiga 10 mg daily  Current orders for Inpatient glycemic control: Semglee 20 units nightly, Novolog 0-9 units tid  Inpatient Diabetes Program Recommendations:   Please consider: -Increase Semglee to 25 units nightly -Add Novolog 4 units tid meal coverage if eats 50% meals  Thank you, Darel Hong E. Tigran Haynie, RN, MSN, CDCES  Diabetes Coordinator Inpatient Glycemic Control Team Team Pager [phone] (8am-5pm) 04/01/2023 11:11 AM

## 2023-04-01 NOTE — Progress Notes (Signed)
Progress Note    04/01/2023 11:47 AM * No surgery date entered *  Subjective:  Samantha Freeman is an 87 yo female who presented to [facility] emergency department with increased work of breathing which started 2 days prior. Patient has a history significant for atrial fibrillation not on any anticoagulation due to epistaxis. She was found to be in A-Fib with RVR and started on anticoagulation. Since then she has developed epistaxis. Seen by ENT and nasal packed which she was unable to tolerate so they were removed. Cauterized but unsuccessful. Vascular Surgery was now consulted to embolize nasal epistaxis as patient will need to be on anticoagulation for her chronic Atrial fibrillation.      Vitals:   04/01/23 0833 04/01/23 1135  BP: 122/88 138/74  Pulse: 64 73  Resp: 16 16  Temp: 98.2 F (36.8 C) 97.8 F (36.6 C)  SpO2: 94%    Physical Exam: Cardiac:  Irregular Rhythm in Atrial fibrillation at controlled rate.  Lungs:  Slightly labored breathing on 5 liters nasal cannula. Bilateral rales in bases of lungs on auscultation.  Incisions:  None Extremities:  Weak but palpable pulses throughout. All extremities are warm to touch  Abdomen:  Positive bowel sounds throughout, soft, non tender and non distended.  Neurologic: AAOX3 answers all questions and follows commands appropriately.   CBC    Component Value Date/Time   WBC 6.5 04/01/2023 0604   RBC 4.68 04/01/2023 0604   HGB 13.7 04/01/2023 0604   HGB 11.9 (L) 10/07/2012 1403   HCT 41.3 04/01/2023 0604   HCT 35.1 10/07/2012 1403   PLT 226 04/01/2023 0604   PLT 281 10/07/2012 1403   MCV 88.2 04/01/2023 0604   MCV 87 10/07/2012 1403   MCH 29.3 04/01/2023 0604   MCHC 33.2 04/01/2023 0604   RDW 14.7 04/01/2023 0604   RDW 15.4 (H) 10/07/2012 1403   LYMPHSABS 1.8 03/28/2023 1038   LYMPHSABS 2.4 10/07/2012 1403   MONOABS 1.1 (H) 03/28/2023 1038   MONOABS 0.5 10/07/2012 1403   EOSABS 0.1 03/28/2023 1038   EOSABS 0.2 10/07/2012  1403   BASOSABS 0.1 03/28/2023 1038   BASOSABS 0.0 10/07/2012 1403    BMET    Component Value Date/Time   NA 132 (L) 04/01/2023 0604   NA 140 02/15/2012 0348   K 4.0 04/01/2023 0604   K 3.7 02/15/2012 0348   CL 98 04/01/2023 0604   CL 105 02/15/2012 0348   CO2 24 04/01/2023 0604   CO2 28 02/15/2012 0348   GLUCOSE 296 (H) 04/01/2023 0604   GLUCOSE 207 (H) 02/15/2012 0348   BUN 25 (H) 04/01/2023 0604   BUN 14 02/15/2012 0348   CREATININE 0.91 04/01/2023 0604   CREATININE 0.87 02/15/2012 0348   CALCIUM 9.1 04/01/2023 0604   CALCIUM 8.6 02/15/2012 0348   GFRNONAA >60 04/01/2023 0604   GFRNONAA >60 02/15/2012 0348   GFRAA 55 (L) 09/15/2019 1353   GFRAA >60 02/15/2012 0348    INR    Component Value Date/Time   INR 1.2 03/28/2023 1736   INR 1.0 01/28/2012 0923     Intake/Output Summary (Last 24 hours) at 04/01/2023 1147 Last data filed at 04/01/2023 0825 Gross per 24 hour  Intake 1811.72 ml  Output 1000 ml  Net 811.72 ml     Assessment/Plan:  87 y.o. female who presents to [facility] emergency department with 1 to 2 days of worsening respiratory difficulties.  Patient was found to be in atrial fibrillation with RVR.  Patient was  not anticoagulated due to history of epistaxis.  Due to the severity of the patient's atrial fibrillation patient was placed on anticoagulation and 2 days later developed epistaxis.  She was seen by ENT for packing and cauterization which were unsuccessful.  Vascular surgery consulted to embolize nasal epistaxis.* No surgery date entered *   PLAN: Vascular surgery plans on taking the patient to the vascular lab tomorrow 04/02/2023 for nasal embolization of epistaxis/hemorrhage.  I had a long detailed discussion at the bedside with the patient concerning the procedure, benefits, risk, and complications.  Patient verbalizes her understanding and wishes to proceed as soon as possible.  I answered all the patient's questions today.  Patient will be made  n.p.o. after midnight for procedure tomorrow.  I discussed the procedure in detail with Dr. Levora Dredge MD and he is in agreement with the plan.  DVT prophylaxis: Heparin infusion   [name] [facility] 04/01/2023 11:47 AM

## 2023-04-01 NOTE — TOC Progression Note (Signed)
Transition of Care [facility]) - Progression Note    Patient Details  Name: Samantha Freeman MRN: [id number] Date of Birth: 1935-10-13  Transition of Care [facility]) CM/SW Contact  Truddie Hidden, RN Phone Number: 04/01/2023, 10:11 AM  Clinical Narrative:    TOC continuing to follow patient's progress throughout discharge planning.   Expected Discharge Plan: Home w Home Health Services Barriers to Discharge: Continued Medical Work up  Expected Discharge Plan and Services In-house Referral: Clinical Social Work   Post Acute Care Choice: NA Loreli Slot) Living arrangements for the past 2 months: Single Family Home                   DME Agency: Other - Comment (Patient with [facility]) Date DME Agency Contacted: 03/28/23 Time DME Agency Contacted: 1620 Representative spoke with at DME Agency: Jennette Kettle [phone] HH Arranged: PT, OT HH Agency: NA ([facility])     Representative spoke with at [facility] Agency: Deanna [phone]   Social Determinants of Health (SDOH) Interventions SDOH Screenings   Food Insecurity: No Food Insecurity (03/28/2023)  Housing: Low Risk  (03/28/2023)  Transportation Needs: No Transportation Needs (03/28/2023)  Utilities: Not At Risk (03/28/2023)  Tobacco Use: Low Risk  (03/28/2023)    Readmission Risk Interventions    03/28/2023    4:20 PM  Readmission Risk Prevention Plan  Transportation Screening Complete  PCP or Specialist Appt within 3-5 Days Complete  HRI or Home Care Consult Complete  Social Work Consult for Recovery Care Planning/Counseling Complete  Palliative Care Screening Not Applicable  Medication Review Oceanographer) Complete

## 2023-04-01 NOTE — Consult Note (Signed)
Hospital Consult    Reason for Consult:  Uncontrolled Epistaxis  Requesting Physician:  Dr Lenis Noon MD MRN #:  [id number]  History of Present Illness: This is a 87 y.o. female  who presented to [facility] emergency department with increased work of breathing which started 2 days prior. Patient has a history significant for atrial fibrillation not on any anticoagulation due to epistaxis. She was found to be in A-Fib with RVR with new bilateral small pulmonary emboli and started on anticoagulation. Since then she has developed epistaxis. Seen by ENT and nasal packed which she was unable to tolerate so they were removed. Cauterized but unsuccessful. Vascular Surgery was now consulted to embolize nasal epistaxis as patient will need to be on anticoagulation for her chronic Atrial fibrillation.   Past Medical History:  Diagnosis Date   Anemia    Anxiety    Aortic stenosis, mild    Arthritis    CKD (chronic kidney disease), stage III (HCC)    Complication of anesthesia    Propofol anaphylaxis   Cortical cataract    DDD (degenerative disc disease), lumbar    Depression    Dyspnea    GERD (gastroesophageal reflux disease)    Headache    Heart murmur    History of 2019 novel coronavirus disease (COVID-19) 12/01/2018   HLD (hyperlipidemia)    Hypertension    Pneumonia    Schatzki's ring    Seasonal allergies    T2DM (type 2 diabetes mellitus) (HCC)    Valvular insufficiency     Past Surgical History:  Procedure Laterality Date   ABDOMINAL HYSTERECTOMY     APPENDECTOMY     BICEPT TENODESIS Right 05/13/2018   Procedure: BICEPS TENODESIS;  Surgeon: Christena Flake, MD;  Location: [facility] ORS;  Service: Orthopedics;  Laterality: Right;   CARDIAC CATHETERIZATION     COLONOSCOPY     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   EYE SURGERY Right 09/2013   Corneal transplant   JOINT REPLACEMENT Right 2015   knee   REVERSE SHOULDER ARTHROPLASTY Left 10/13/2020   Procedure: REVERSE SHOULDER  ARTHROPLASTY;  Surgeon: Christena Flake, MD;  Location: [facility] ORS;  Service: Orthopedics;  Laterality: Left;   SHOULDER ARTHROSCOPY WITH ROTATOR CUFF REPAIR Right 05/13/2018   Procedure: SHOULDER ARTHROSCOPY WITH DEBRIDEMENT, DECOMPRESSION AND ROTATOR CUFF REPAIR;  Surgeon: Christena Flake, MD;  Location: [facility] ORS;  Service: Orthopedics;  Laterality: Right;   TONSILLECTOMY      Allergies  Allergen Reactions   Buspirone     Other Reaction(s): Dizziness   Propofol Anaphylaxis   Zolpidem Other (See Comments)    Hallucinations   Cholestyramine Itching and Rash   Codeine Nausea Only   Hydrochlorothiazide Other (See Comments)    PATIENT DOES NOT REMEMBER   Loratadine Other (See Comments)    PATIENT DOES NOT REMEMBER   Niacin Rash    Prior to Admission medications   Medication Sig Start Date End Date Taking? Authorizing Provider  ALPRAZolam Prudy Feeler) 0.5 MG tablet Take 0.5 mg by mouth at bedtime as needed for anxiety or sleep.   Yes [provider]  aspirin EC 81 MG tablet Take 1 tablet (81 mg total) by mouth daily. Swallow whole. 03/07/23 04/06/23 Yes Charise Killian, MD  Calcium Carb-Cholecalciferol (CALCIUM 600/VITAMIN D3 PO) Take 1 tablet by mouth daily.   Yes [provider]  Coenzyme Q10 10 MG capsule Take 10 mg by mouth every morning.   Yes [provider]  dapagliflozin propanediol (FARXIGA) 10 MG TABS tablet Take 10 mg by mouth daily. 11/03/20  Yes [provider]  digoxin (LANOXIN) 0.125 MG tablet Take 1 tablet (0.125 mg total) by mouth every other day. 03/14/23 04/13/23 Yes Charise Killian, MD  diltiazem (CARDIZEM CD) 180 MG 24 hr capsule Take 1 capsule (180 mg total) by mouth daily. 03/13/23 04/12/23 Yes Charise Killian, MD  fenofibrate (TRICOR) 48 MG tablet Take 1 tablet by mouth daily. 12/23/22  Yes [provider]  furosemide (LASIX) 20 MG tablet Take 20 mg by mouth daily as needed for fluid or edema. 05/22/22 12/31/23 Yes [provider]  gabapentin (NEURONTIN) 100 MG capsule Take 200 mg by mouth 3 (three) times daily.   Yes [provider]  hydrOXYzine (ATARAX) 25 MG tablet Take 1 tablet by mouth 2 (two) times daily. 02/16/21  Yes [provider]  insulin lispro (HUMALOG) 100 UNIT/ML injection Inject 7-19 Units into the skin 3 (three) times daily before meals. Blood Glucose level: 140-199 - 7 units, 200-250 - 9 units, 251-299 - 13 units,  300-349 - 17 units,  350 or above 19 units.   Yes [provider]  LANTUS SOLOSTAR 100 UNIT/ML Solostar Pen Inject 36 Units into the skin at bedtime. 02/22/23  Yes [provider]  magnesium oxide (MAG-OX) 400 MG tablet Take 800 mg by mouth 3 (three) times daily. 05/18/13  Yes [provider]  metoprolol tartrate (LOPRESSOR) 50 MG tablet Take 1 tablet (50 mg total) by mouth 2 (two) times daily. 03/12/23 04/11/23 Yes Charise Killian, MD  Multiple Vitamin (MULTIVITAMIN) tablet Take 1 tablet by mouth daily. Women's Daily Multivitamin   Yes [provider]  pantoprazole (PROTONIX) 40 MG tablet Take 1 tablet (40 mg total) by mouth daily. 02/05/23  Yes Jonah Blue, MD  PARoxetine (PAXIL) 10 MG tablet Take 10 mg by mouth daily. 02/22/23  Yes [provider]  prednisoLONE acetate (PRED FORTE) 1 % ophthalmic suspension Place 1 drop into both eyes at bedtime. 10/04/17  Yes [provider]  rOPINIRole (REQUIP) 1 MG tablet Take 1 mg by mouth at bedtime. 03/26/23  Yes [provider]  rosuvastatin (CRESTOR) 10 MG tablet Take 10 mg by mouth daily.   Yes [provider]  vitamin B-12 (CYANOCOBALAMIN) 1000 MCG tablet Take 1,000 mcg by mouth daily.   Yes [provider]  blood glucose meter kit and supplies KIT Dispense based on patient and insurance preference. Use up to four times daily as directed. (FOR ICD-9 250.00, 250.01). For QAC - HS accuchecks. 12/11/18   Leroy Sea, MD  ferrous sulfate  325 (65 FE) MG tablet Take 325 mg by mouth daily with breakfast. Patient not taking: Reported on 02/25/2023    [provider]  glucose blood (FREESTYLE LITE) test strip For glucose testing every before meals at bedtime. Diagnosis E 11.65  Can substitute to any accepted brand 12/11/18   Leroy Sea, MD  Insulin Syringe-Needle U-100 25G X 1" 1 ML MISC For 4 times a day insulin SQ, 1 month supply. Diagnosis E11.65 12/11/18   Leroy Sea, MD  traZODone (DESYREL) 50 MG tablet Take 50 mg by mouth at bedtime. Patient not taking: Reported on 03/28/2023    [provider]  apixaban (ELIQUIS) 5 MG TABS tablet Take 1 tablet (5 mg total) by mouth every 12 (twelve) hours. 02/01/23 02/22/23      Social History   Socioeconomic History   Marital  status: Widowed    Spouse name: Not on file   Number of children: Not on file   Years of education: Not on file   Highest education level: Not on file  Occupational History   Not on file  Tobacco Use   Smoking status: Never   Smokeless tobacco: Never  Vaping Use   Vaping status: Never Used  Substance and Sexual Activity   Alcohol use: Never   Drug use: Never   Sexual activity: Not Currently  Other Topics Concern   Not on file  Social History Narrative   Lives alone, has support from son who is a Optician, dispensing and daughter in Social worker. Will be with mother when she has surgery.   Social Determinants of Health   Financial Resource Strain: Not on file  Food Insecurity: No Food Insecurity (03/28/2023)   Hunger Vital Sign    Worried About Running Out of Food in the Last Year: Never true    Ran Out of Food in the Last Year: Never true  Transportation Needs: No Transportation Needs (03/28/2023)   PRAPARE - Administrator, Civil Service (Medical): No    Lack of Transportation (Non-Medical): No  Physical Activity: Not on file  Stress: Not on file  Social Connections: Not on file  Intimate Partner Violence: Not At Risk  (03/28/2023)   Humiliation, Afraid, Rape, and Kick questionnaire    Fear of Current or Ex-Partner: No    Emotionally Abused: No    Physically Abused: No    Sexually Abused: No     Family History  Problem Relation Age of Onset   Breast cancer Paternal Aunt     ROS: Otherwise negative unless mentioned in HPI  Physical Examination  Vitals:   04/01/23 0833 04/01/23 1135  BP: 122/88 138/74  Pulse: 64 73  Resp: 16 16  Temp: 98.2 F (36.8 C) 97.8 F (36.6 C)  SpO2: 94%    Body mass index is 30.65 kg/m.  General:  WDWN in NAD Gait: Not observed HENT: WNL, normocephalic Pulmonary: normal non-labored breathing, without Rales, rhonchi,  wheezing Cardiac: irregular, Hx A-Fib with RVR, without  Murmurs, rubs or gallops; without carotid bruits Abdomen: Positive bowel sounds, soft, NT/ND, no masses Skin: without rashes Vascular Exam/Pulses: Weak palpable pulses throughout Extremities: without ischemic changes, without Gangrene , without cellulitis; without open wounds;  Musculoskeletal: no muscle wasting or atrophy  Neurologic: A&O X 3;  No focal weakness or paresthesias are detected; speech is fluent/normal Psychiatric:  The pt has Normal affect. Lymph:  Unremarkable  CBC    Component Value Date/Time   WBC 6.5 04/01/2023 0604   RBC 4.68 04/01/2023 0604   HGB 13.7 04/01/2023 0604   HGB 11.9 (L) 10/07/2012 1403   HCT 41.3 04/01/2023 0604   HCT 35.1 10/07/2012 1403   PLT 226 04/01/2023 0604   PLT 281 10/07/2012 1403   MCV 88.2 04/01/2023 0604   MCV 87 10/07/2012 1403   MCH 29.3 04/01/2023 0604   MCHC 33.2 04/01/2023 0604   RDW 14.7 04/01/2023 0604   RDW 15.4 (H) 10/07/2012 1403   LYMPHSABS 1.8 03/28/2023 1038   LYMPHSABS 2.4 10/07/2012 1403   MONOABS 1.1 (H) 03/28/2023 1038   MONOABS 0.5 10/07/2012 1403   EOSABS 0.1 03/28/2023 1038   EOSABS 0.2 10/07/2012 1403   BASOSABS 0.1 03/28/2023 1038   BASOSABS 0.0 10/07/2012 1403    BMET    Component Value Date/Time    NA 132 (L) 04/01/2023 0604  NA 140 02/15/2012 0348   K 4.0 04/01/2023 0604   K 3.7 02/15/2012 0348   CL 98 04/01/2023 0604   CL 105 02/15/2012 0348   CO2 24 04/01/2023 0604   CO2 28 02/15/2012 0348   GLUCOSE 296 (H) 04/01/2023 0604   GLUCOSE 207 (H) 02/15/2012 0348   BUN 25 (H) 04/01/2023 0604   BUN 14 02/15/2012 0348   CREATININE 0.91 04/01/2023 0604   CREATININE 0.87 02/15/2012 0348   CALCIUM 9.1 04/01/2023 0604   CALCIUM 8.6 02/15/2012 0348   GFRNONAA >60 04/01/2023 0604   GFRNONAA >60 02/15/2012 0348   GFRAA 55 (L) 09/15/2019 1353   GFRAA >60 02/15/2012 0348    COAGS: Lab Results  Component Value Date   INR 1.2 03/28/2023   INR 1.1 01/30/2023   INR 1.0 09/15/2019     Non-Invasive Vascular Imaging:   EXAM:03/28/23 CT ANGIOGRAPHY CHEST WITH CONTRAST   TECHNIQUE: Multidetector CT imaging of the chest was performed using the standard protocol during bolus administration of intravenous contrast. Multiplanar CT image reconstructions and MIPs were obtained to evaluate the vascular anatomy.   RADIATION DOSE REDUCTION: This exam was performed according to the departmental dose-optimization program which includes automated exposure control, adjustment of the mA and/or kV according to patient size and/or use of iterative reconstruction technique.   CONTRAST:  75mL OMNIPAQUE IOHEXOL 350 MG/ML SOLN   COMPARISON:  CT angiogram 02/25/2023.  X-ray 03/28/2023 and older.   FINDINGS: Cardiovascular: Heart is enlarged. Trace pericardial fluid. Coronary artery calcifications are seen. Minimal contrast opacification along the thoracic aorta. Grossly normal course and caliber with vascular calcifications. There is some dilatation of the main pulmonary artery. Please correlate for pulmonary artery hypertension. There is a pulmonary embolism identified in the upper aspect of the middle lobe as seen on series 6, image 213. This has not seen on the previous examination. There is  also embolus along the lingula on series 6, image 180, not seen previously. Overall mild clot burden. No larger or more central embolus. Some limited evaluation related to motion.   Mediastinum/Nodes: Patulous esophagus. Heterogeneous thyroid gland. There are several prominent nodes identified in the mediastinum and hilum bilaterally, similar to previous. No axillary nodes.   Lungs/Pleura: Trace pleural fluid with the adjacent parenchymal opacities. The opacities are increasing from previous at the lower lobes. There are also some dependent areas in the upper lobes. There is some new nodules identified as well in the left upper lobe measuring 10 mm on series 5, image 38. With the time course favor a infectious or inflammatory process as this has not seen previously. There are areas of bronchial wall thickening, mucous plugging and interstitial septal thickening. Few areas of ground-glass as well.   Upper Abdomen: The adrenal glands are preserved in the upper abdomen. Stomach is relatively collapsed punctate nonobstructing right-sided renal stone at the edge of the imaging field.   Musculoskeletal: Surgical changes about the left shoulder. Scattered degenerative changes along the spine.   Review of the MIP images confirms the above findings.   Critical Value/emergent results were called by telephone at the time of interpretation on 03/28/2023 at 4:21 pm EST to provider Dr. Alvester Morin, who verbally acknowledged these results.   IMPRESSION: New bilateral few segmental and smaller pulmonary emboli. Mild clot burden. No larger or more central embolus.   There is some enlargement of the main pulmonary arteries. Please correlate for pulmonary artery hypertension. This has seen previously.   Enlarged heart.   Increasing parenchymal  bilateral lung opacities identified with some subtle nodularity, ground-glass and interstitial septal thickening. Few presumed reactive nodes. Recommend  follow up to confirm resolution.   Persistent tiny pleural effusions, left-greater-than-right.   Aortic Atherosclerosis (ICD10-I70.0).  Statin:  Yes.   Beta Blocker:  Yes.   Aspirin:  Yes.   ACEI:  No. ARB:  No. CCB use:  Yes Other antiplatelets/anticoagulants:  Yes.  Eliquis 5 mg BID  Patient NOT TAKING ELIQUIS AND ASA DUE TO EPISTAXIS NOW.    ASSESSMENT/PLAN: This is a 87 y.o. female who presents to [facility] emergency department with 1 to 2 days of worsening respiratory difficulties.  Patient was found to be in atrial fibrillation with RVR.  Patient was not anticoagulated due to history of epistaxis.  Due to the severity of the patient's atrial fibrillation patient was placed on anticoagulation and 2 days later developed epistaxis.  She was seen by ENT for packing and cauterization which were unsuccessful.  Vascular surgery consulted to embolize nasal epistaxis.* No surgery date entered *   PLAN: Vascular surgery plans on taking the patient to the vascular lab tomorrow 04/02/2023 for nasal embolization of epistaxis/hemorrhage.  I had a long detailed discussion at the bedside with the patient concerning the procedure, benefits, risk, and complications.  Patient verbalizes her understanding and wishes to proceed as soon as possible.  I answered all the patient's questions today.  Patient will be made n.p.o. after midnight for procedure tomorrow.   I discussed the procedure in detail with Dr. Levora Dredge MD and he is in agreement with the plan.   DVT prophylaxis -Heparin infusion    [name] 04/01/2023 2:45 PM

## 2023-04-02 ENCOUNTER — Telehealth (HOSPITAL_COMMUNITY): Payer: Self-pay | Admitting: Pharmacy Technician

## 2023-04-02 ENCOUNTER — Encounter
Admission: EM | Disposition: A | Discharge status: Home Health | Payer: Self-pay | Source: Home / Self Care | Attending: Hospitalist

## 2023-04-02 ENCOUNTER — Other Ambulatory Visit (HOSPITAL_COMMUNITY): Payer: Self-pay

## 2023-04-02 DIAGNOSIS — J9601 Acute respiratory failure with hypoxia: Secondary | ICD-10-CM | POA: Diagnosis not present

## 2023-04-02 DIAGNOSIS — Z7901 Long term (current) use of anticoagulants: Secondary | ICD-10-CM

## 2023-04-02 HISTORY — PX: EMBOLIZATION (CATH LAB): CATH118239

## 2023-04-02 LAB — GLUCOSE, CAPILLARY
Glucose-Capillary: 293 mg/dL — ABNORMAL HIGH (ref 70–99)
Glucose-Capillary: 224 mg/dL — ABNORMAL HIGH (ref 70–99)
Glucose-Capillary: 246 mg/dL — ABNORMAL HIGH (ref 70–99)
Glucose-Capillary: 268 mg/dL — ABNORMAL HIGH (ref 70–99)

## 2023-04-02 LAB — CBC
HCT: 38.2 % (ref 36.0–46.0)
Hemoglobin: 12.7 g/dL (ref 12.0–15.0)
MCH: 30 pg (ref 26.0–34.0)
MCHC: 33.2 g/dL (ref 30.0–36.0)
MCV: 90.3 fL (ref 80.0–100.0)
Platelets: 235 10*3/uL (ref 150–400)
RBC: 4.23 MIL/uL (ref 3.87–5.11)
RDW: 14.6 % (ref 11.5–15.5)
WBC: 7 10*3/uL (ref 4.0–10.5)
nRBC: 0 % (ref 0.0–0.2)

## 2023-04-02 LAB — BASIC METABOLIC PANEL
Anion gap: 8 (ref 5–15)
BUN: 23 mg/dL (ref 8–23)
CO2: 27 mmol/L (ref 22–32)
Calcium: 9.1 mg/dL (ref 8.9–10.3)
Chloride: 97 mmol/L — ABNORMAL LOW (ref 98–111)
Creatinine, Ser: 0.88 mg/dL (ref 0.44–1.00)
GFR, Estimated: >60 mL/min (ref >60)
Glucose, Bld: 286 mg/dL — ABNORMAL HIGH (ref 70–99)
Potassium: 3.9 mmol/L (ref 3.5–5.1)
Sodium: 132 mmol/L — ABNORMAL LOW (ref 135–145)

## 2023-04-02 LAB — HEPARIN LEVEL (UNFRACTIONATED): Heparin Unfractionated: 0.46 [IU]/mL (ref 0.30–0.70)

## 2023-04-02 LAB — MAGNESIUM: Magnesium: 1.4 mg/dL — ABNORMAL LOW (ref 1.7–2.4)

## 2023-04-02 SURGERY — EMBOLIZATION
Anesthesia: Moderate Sedation | Laterality: Bilateral

## 2023-04-02 MED ORDER — IODIXANOL 320 MG/ML IV SOLN
INTRAVENOUS | Status: DC | PRN
  Administered 2023-04-02: 45 mL

## 2023-04-02 MED ORDER — MIDAZOLAM HCL 2 MG/2ML IJ SOLN
INTRAMUSCULAR | Status: AC
  Filled 2023-04-02: qty 2

## 2023-04-02 MED ORDER — MIDAZOLAM HCL 2 MG/2ML IJ SOLN
INTRAMUSCULAR | Status: DC | PRN
  Administered 2023-04-02: 1 mg via INTRAVENOUS

## 2023-04-02 MED ORDER — HEPARIN SODIUM (PORCINE) 1000 UNIT/ML IJ SOLN
INTRAMUSCULAR | Status: AC
  Filled 2023-04-02: qty 10

## 2023-04-02 MED ORDER — SODIUM CHLORIDE 0.9% FLUSH
3.0000 mL | Freq: Two times a day (BID) | INTRAVENOUS | Status: DC
Start: 2023-04-02 — End: 2023-04-02

## 2023-04-02 MED ORDER — FENTANYL CITRATE (PF) 100 MCG/2ML IJ SOLN
INTRAMUSCULAR | Status: DC | PRN
  Administered 2023-04-02: 50 ug via INTRAVENOUS

## 2023-04-02 MED ORDER — ACETAMINOPHEN 500 MG PO TABS
1000.0000 mg | ORAL_TABLET | Freq: Three times a day (TID) | ORAL | Status: DC | PRN
  Administered 2023-04-02 – 2023-04-07 (×4): 1000 mg via ORAL
  Filled 2023-04-02 (×4): qty 2

## 2023-04-02 MED ORDER — DIPHENHYDRAMINE HCL 50 MG/ML IJ SOLN: 50.0000 mg | Freq: Once | INTRAMUSCULAR | Status: DC | PRN

## 2023-04-02 MED ORDER — SODIUM CHLORIDE 0.9 % IV SOLN: 250.0000 mL | INTRAVENOUS | Status: AC | PRN

## 2023-04-02 MED ORDER — CEFAZOLIN SODIUM-DEXTROSE 2-4 GM/100ML-% IV SOLN
2.0000 g | INTRAVENOUS | Status: AC
  Administered 2023-04-02: 2 g via INTRAVENOUS
  Filled 2023-04-02: qty 100

## 2023-04-02 MED ORDER — HEPARIN (PORCINE) IN NACL 1000-0.9 UT/500ML-% IV SOLN
INTRAVENOUS | Status: DC | PRN
  Administered 2023-04-02: 1000 mL

## 2023-04-02 MED ORDER — DILTIAZEM HCL ER COATED BEADS 180 MG PO CP24
360.0000 mg | ORAL_CAPSULE | Freq: Every day | ORAL | Status: DC
  Administered 2023-04-03 – 2023-04-08 (×6): 360 mg via ORAL
  Filled 2023-04-02 (×6): qty 2

## 2023-04-02 MED ORDER — METHYLPREDNISOLONE SODIUM SUCC 125 MG IJ SOLR: 125.0000 mg | Freq: Once | INTRAMUSCULAR | Status: DC | PRN

## 2023-04-02 MED ORDER — INSULIN GLARGINE-YFGN 100 UNIT/ML ~~LOC~~ SOLN
30.0000 [IU] | Freq: Every day | SUBCUTANEOUS | Status: DC
  Administered 2023-04-02 – 2023-04-07 (×6): 30 [IU] via SUBCUTANEOUS
  Filled 2023-04-02 (×8): qty 0.3

## 2023-04-02 MED ORDER — FENTANYL CITRATE PF 50 MCG/ML IJ SOSY: 12.5000 ug | PREFILLED_SYRINGE | Freq: Once | INTRAMUSCULAR | Status: DC | PRN

## 2023-04-02 MED ORDER — LIDOCAINE HCL (PF) 1 % IJ SOLN
INTRAMUSCULAR | Status: DC | PRN
  Administered 2023-04-02: 10 mL

## 2023-04-02 MED ORDER — OXYCODONE HCL 5 MG PO TABS
5.0000 mg | ORAL_TABLET | ORAL | Status: DC | PRN
  Administered 2023-04-02 – 2023-04-04 (×4): 5 mg via ORAL
  Filled 2023-04-02 (×4): qty 1

## 2023-04-02 MED ORDER — DIGOXIN 125 MCG PO TABS
0.1250 mg | ORAL_TABLET | ORAL | Status: DC
  Administered 2023-04-02 – 2023-04-08 (×4): 0.125 mg via ORAL
  Filled 2023-04-02 (×4): qty 1

## 2023-04-02 MED ORDER — FAMOTIDINE 20 MG PO TABS: 40.0000 mg | ORAL_TABLET | Freq: Once | ORAL | Status: DC | PRN

## 2023-04-02 MED ORDER — INSULIN ASPART 100 UNIT/ML IJ SOLN
6.0000 [IU] | Freq: Three times a day (TID) | INTRAMUSCULAR | Status: DC
  Administered 2023-04-02 – 2023-04-05 (×7): 6 [IU] via SUBCUTANEOUS
  Filled 2023-04-02 (×7): qty 1

## 2023-04-02 MED ORDER — MAGNESIUM SULFATE 4 GM/100ML IV SOLN
4.0000 g | Freq: Once | INTRAVENOUS | Status: AC
  Administered 2023-04-02: 4 g via INTRAVENOUS
  Filled 2023-04-02: qty 100

## 2023-04-02 MED ORDER — SODIUM CHLORIDE 0.9 % IV SOLN: INTRAVENOUS | Status: AC

## 2023-04-02 MED ORDER — CEFAZOLIN SODIUM-DEXTROSE 2-4 GM/100ML-% IV SOLN
INTRAVENOUS | Status: AC
Start: 2023-04-02 — End: ?
  Filled 2023-04-02: qty 100

## 2023-04-02 MED ORDER — DILTIAZEM HCL 30 MG PO TABS
90.0000 mg | ORAL_TABLET | Freq: Four times a day (QID) | ORAL | Status: AC
  Administered 2023-04-02 – 2023-04-03 (×3): 90 mg via ORAL
  Filled 2023-04-02 (×4): qty 3

## 2023-04-02 MED ORDER — SODIUM CHLORIDE 0.9% FLUSH: 3.0000 mL | INTRAVENOUS | Status: DC | PRN

## 2023-04-02 MED ORDER — SODIUM CHLORIDE 0.9 % IV SOLN: INTRAVENOUS | Status: DC

## 2023-04-02 MED ORDER — FENTANYL CITRATE PF 50 MCG/ML IJ SOSY
PREFILLED_SYRINGE | INTRAMUSCULAR | Status: AC
  Filled 2023-04-02: qty 1

## 2023-04-02 MED ORDER — MIDAZOLAM HCL 2 MG/ML PO SYRP: 8.0000 mg | ORAL_SOLUTION | Freq: Once | ORAL | Status: DC | PRN

## 2023-04-02 MED ORDER — HEPARIN SODIUM (PORCINE) 1000 UNIT/ML IJ SOLN
INTRAMUSCULAR | Status: DC | PRN
  Administered 2023-04-02: 3000 [IU] via INTRAVENOUS

## 2023-04-02 SURGICAL SUPPLY — 18 items
CATH ANGIO 5F PIGTAIL 100CM (CATHETERS) IMPLANT
CATH MICROCATH PRGRT 2.8F 130 (MICROCATHETER) IMPLANT
CATH SLIP .038X100 JB2 (CATHETERS) IMPLANT
COIL 400 COMPLEX SOFT 3X15CM (Vascular Products) IMPLANT
COIL 400 COMPLEX SOFT 3X5CM (Vascular Products) IMPLANT
COVER PROBE ULTRASOUND 5X96 (MISCELLANEOUS) IMPLANT
DEVICE STARCLOSE SE CLOSURE (Vascular Products) IMPLANT
DEVICE TORQUE (MISCELLANEOUS) IMPLANT
GLIDEWIRE ANGLED SS 035X260CM (WIRE) IMPLANT
HANDLE DETACHMENT COIL (MISCELLANEOUS) IMPLANT
MICROCATH PROGREAT 2.8F 130CM (MICROCATHETER) ×1
NDL ENTRY 21GA 7CM ECHOTIP (NEEDLE) IMPLANT
NEEDLE ENTRY 21GA 7CM ECHOTIP (NEEDLE) ×1 IMPLANT
PACK ANGIOGRAPHY (CUSTOM PROCEDURE TRAY) ×1 IMPLANT
SET INTRO CAPELLA COAXIAL (SET/KITS/TRAYS/PACK) IMPLANT
SHEATH BRITE TIP 5FRX11 (SHEATH) IMPLANT
TUBING CONTRAST HIGH PRESS 72 (TUBING) IMPLANT
WIRE GUIDERIGHT .035X150 (WIRE) IMPLANT

## 2023-04-02 NOTE — Op Note (Signed)
[facility]  Percutaneous Study/Intervention Procedural Note     Surgeon(s): Best boy: none  Pre-operative Diagnosis:  1. Intractable nosebleeds 2.  Bilateral PE in association with A-fib requiring anticoagulation   Post-operative diagnosis:  Same  Procedure(s) Performed:             1.  Ultrasound guidance for vascular access right femoral artery             2.  Catheter placement into left maxillary artery third order catheter placement             3.  Thoracic aortogram and selective angiogram of the left external carotid artery and then the left maxillary artery             4.  Coil embolization of distal left maxillary artery with 3 mm Ruby coils             5.  StarClose closure device right femoral artery  Anesthesia: Continuous ECG pulse oximetry and cardiopulmonary monitoring was performed throughout the entire procedure by the interventional radiology nurse.  Parenteral Versed and fentanyl were administered.  Total sedation time was 47 minutes.     EBL: Less than 10 cc  Fluoro Time: 7.0 minutes  Contrast: 45 cc              Indications:  Patient is a 87 y.o.female with tractable nosebleeds requiring packing and multiple hospital or doctor visits.  We are asked to consider maxillary artery embolization.  The patient is brought in for angiography for further evaluation and potential treatment. Risks and benefits are discussed and informed consent is obtained  Procedure:  The patient was identified and appropriate procedural time out was performed.  The patient was then placed supine on the table and prepped and draped in the usual sterile fashion. Moderate conscious sedation was administered during a face to face encounter with the patient throughout the procedure with my supervision of the RN administering medicines and monitoring the patient's vital signs, pulse oximetry, telemetry and mental status throughout from the start of  the procedure until the patient was taken to the recovery room.    Ultrasound was used to evaluate the right common femoral artery.  It was patent .  A digital ultrasound image was acquired.  A micropuncture needle was used to access the right common femoral artery under direct ultrasound guidance and a permanent image was performed.  A microwire was advanced without difficulty followed by microsheath.  A 0.035 J wire was advanced without resistance and a 5Fr sheath was placed.  Pigtail catheter was placed into the ascending aorta and an LAO projection thoracic aortogram was performed. This demonstrated a type III arch we then cannulated the left common carotid artery with a JB2 catheter.  Selective imaging showed the common carotid artery to be relatively normal with a typical carotid bifurcation.  We then advanced with the glide wire and the catheter out into the left external carotid artery where selective imaging was performed.  This helped identify the origin of the maxillary artery which was then cannulated with a prograde microcatheter.  Selective imaging was then performed to the maxillary artery demonstrating this was directly feeding the nostril.  We then proceeded with coil embolization.  We began with a 3 mm x 15 cm soft Ruby coil starting as distal as possible.  We then placed 3 mm x 5 cm soft Ruby coils back in the maxillary artery until it  was successfully embolized.  This was demonstrated both injections through the prograde microcatheter and then the JB2 diagnostic catheter in the external carotid artery.  I elected to terminate the procedure. The diagnostic catheter was removed. StarClose closure device was deployed in usual fashion with excellent hemostatic result. The patient was taken to the recovery room in stable condition having tolerated the procedure well.     Findings: Initial imaging demonstrates a type III arch.  There is no stenosis or hemodynamically significant lesions of the  great vessels.  The left carotid bifurcation is essentially normal very mild plaque formation.  The maxillary artery is identified and subsequently selected.  With selective injection via the prograde catheter within the mid maxillary artery there is a very prominent and distinct blush associated with nasal filling.  Therefore I moved forward with embolization.  2 Ruby coils were used as described above with successful embolization follow-up imaging demonstrated a marked decrease in the blush.  Disposition: Patient was taken to the recovery room in stable condition having tolerated the procedure well.  Complications:  None  Levora Dredge 04/02/2023 2:05 PM   This note was created with Dragon Medical transcription system. Any errors in dictation are purely unintentional.

## 2023-04-02 NOTE — Plan of Care (Signed)
  Problem: Education: Goal: Knowledge of General Education information will improve Description: Including pain rating scale, medication(s)/side effects and non-pharmacologic comfort measures Outcome: Progressing   Problem: Health Behavior/Discharge Planning: Goal: Ability to manage health-related needs will improve Outcome: Progressing   Problem: Clinical Measurements: Goal: Will remain free from infection Outcome: Progressing   Problem: Clinical Measurements: Goal: Respiratory complications will improve Outcome: Progressing   Problem: Clinical Measurements: Goal: Cardiovascular complication will be avoided Outcome: Progressing   Problem: Elimination: Goal: Will not experience complications related to bowel motility Outcome: Progressing   Problem: Elimination: Goal: Will not experience complications related to urinary retention Outcome: Progressing   Problem: Pain Management: Goal: General experience of comfort will improve Outcome: Progressing   Problem: Safety: Goal: Ability to remain free from injury will improve Outcome: Progressing

## 2023-04-02 NOTE — Progress Notes (Signed)
30 degrees @ 3 45 degrees  3:30 Bedrest ends @ 4

## 2023-04-02 NOTE — Interval H&P Note (Signed)
History and Physical Interval Note:  04/02/2023 12:18 PM  Samantha Freeman  has presented today for surgery, with the diagnosis of Nasal Hemorhage.  The various methods of treatment have been discussed with the patient and family. After consideration of risks, benefits and other options for treatment, the patient has consented to  Procedure(s): EMBOLIZATION (Bilateral) as a surgical intervention.  The patient's history has been reviewed, patient examined, no change in status, stable for surgery.  I have reviewed the patient's chart and labs.  Questions were answered to the patient's satisfaction.     Levora Dredge

## 2023-04-02 NOTE — Consult Note (Signed)
PHARMACY - ANTICOAGULATION CONSULT NOTE  Pharmacy Consult for Heparin Indication: pulmonary embolus  Allergies  Allergen Reactions   Buspirone     Other Reaction(s): Dizziness   Propofol Anaphylaxis   Zolpidem Other (See Comments)    Hallucinations   Cholestyramine Itching and Rash   Codeine Nausea Only   Hydrochlorothiazide Other (See Comments)    PATIENT DOES NOT REMEMBER   Loratadine Other (See Comments)    PATIENT DOES NOT REMEMBER   Niacin Rash   Patient Measurements: Height: 5\' 4"  (162.6 cm) Weight: 81 kg (178 lb 9.2 oz) IBW/kg (Calculated) : 54.7 Heparin Dosing Weight: 73.7 kg   Vital Signs: Temp: 98.5 F (36.9 C) (12/10 0345) Temp Source: Oral (12/10 0345) BP: 120/70 (12/10 0345) Pulse Rate: 80 (12/10 0345)  Labs: Recent Labs    03/31/23 0622 04/01/23 0604 04/02/23 0409  HGB 14.4 13.7 12.7  HCT 43.2 41.3 38.2  PLT 234 226 235  HEPARINUNFRC 0.43 0.47 0.46  CREATININE 0.98 0.91 0.88   Estimated Creatinine Clearance: 48.1 mL/min (by C-G formula based on SCr of 0.88 mg/dL).  Medical History: Past Medical History:  Diagnosis Date   Anemia    Anxiety    Aortic stenosis, mild    Arthritis    CKD (chronic kidney disease), stage III (HCC)    Complication of anesthesia    Propofol anaphylaxis   Cortical cataract    DDD (degenerative disc disease), lumbar    Depression    Dyspnea    GERD (gastroesophageal reflux disease)    Headache    Heart murmur    History of 2019 novel coronavirus disease (COVID-19) 12/01/2018   HLD (hyperlipidemia)    Hypertension    Pneumonia    Schatzki's ring    Seasonal allergies    T2DM (type 2 diabetes mellitus) (HCC)    Valvular insufficiency    Medications:  Patient was taking apixaban previously but it was held due to epistaxis - most recently filled in October; per patient she stopped taking in October   Assessment: Samantha Freeman is an 87 year old female that presented with difficulty breathing. PMH is  significant for atrial fibrillation not on AC, HFpEF, type 2 diabetes, hypertension, hyperlipidemia, CKD stage IIIa, depression/anxiety presented acute respiratory failure hypoxia, A-fib with RVR, and acute on chronic HFpEF. From chart review, patient was taking apixaban previously but it was held due to epistaxis. CTA of the chest notable for bilateral small to moderate PE. Pharmacy has been consulted for initiation and management of a heparin infusion. Baseline labs: Hgb 14.6, PLT 210, aPTT and PT/INR ordered.   Goal of Therapy:  Heparin level 0.3 - 0.7  Monitor platelets by anticoagulation protocol: Yes  12/6@0202 : HL=0.66, therapeutic X 1@1250  units/hr 12/6@0954 : HL=0.74, supratherapeutic@1250  units/hr 12/6@2018 : HL=0.62, Therapeutic x 1 12/7@0637 : HL=0.58, Therapeutic X 2  12/8@0622 : HL=0.43, therapeutic x3 12/9@0604 : HL=0.47, therapeutic X 4 12/10 0409 HL 0.46, therapeutic x 5   Plan:  - continue heparin infusion at 1150 units/hr - recheck HL with AM labs - Monitor CBC and HL daily   Otelia Sergeant, PharmD, [facility] 04/02/2023 6:37 AM

## 2023-04-02 NOTE — Telephone Encounter (Signed)
Patient Product/process development scientist completed.    The patient is insured through [facility]. Patient has Medicare and is not eligible for a copay card, but may be able to apply for patient assistance, if available.    Ran test claim for Eliquis 5 mg and the current 30 day co-pay is $149.53 due to being in Coverage Gap (donut hole).  Ran test claim for Xarelto 20 mg and the current 30 day co-pay is $143.30 due to being in Coverage Gap (donut hole).  This test claim was processed through [facility]- copay amounts may vary at other pharmacies due to pharmacy/plan contracts, or as the patient moves through the different stages of their insurance plan.     Samantha Freeman, CPHT Pharmacy Technician III Certified Patient Advocate [facility] Patient Advocate Team Direct Number: [phone]  Fax: [phone]

## 2023-04-02 NOTE — Progress Notes (Addendum)
WCTM  Vitals retaken; pt no longer yellow mews.     04/02/23 0726  Assess: MEWS Score  Temp 97.9 F (36.6 C)  BP (!) 135/101  MAP (mmHg) 114  Pulse Rate 82  ECG Heart Rate (!) 107  Resp (!) 23  Level of Consciousness Alert  SpO2 90 %  O2 Device Nasal Cannula  O2 Flow Rate (L/min) 5 L/min  Assess: MEWS Score  MEWS Temp 0  MEWS Systolic 0  MEWS Pulse 1  MEWS RR 1  MEWS LOC 0  MEWS Score 2  MEWS Score Color Yellow  Assess: if the MEWS score is Yellow or Red  Were vital signs accurate and taken at a resting state? Yes  Does the patient meet 2 or more of the SIRS criteria? Yes  Does the patient have a confirmed or suspected source of infection? No  MEWS guidelines implemented  Yes, yellow  Treat  MEWS Interventions Considered administering scheduled or prn medications/treatments as ordered  Take Vital Signs  Increase Vital Sign Frequency  Yellow: Q2hr x1, continue Q4hrs until patient remains green for 12hrs  Escalate  MEWS: Escalate Yellow: Discuss with charge nurse and consider notifying provider and/or RRT  Notify: Charge Nurse/RN  Name of Charge Nurse/RN Notified Bary Richard RN  Assess: SIRS CRITERIA  SIRS Temperature  0  SIRS Pulse 1  SIRS Respirations  1  SIRS WBC 0  SIRS Score Sum  2

## 2023-04-02 NOTE — Progress Notes (Signed)
OT Cancellation Note  Patient Details Name: Samantha Freeman MRN: [id number] DOB: 14-Oct-1935   Cancelled Treatment:    Reason Eval/Treat Not Completed: Patient at procedure or test/ unavailable. Pt noted to be off the floor for procedure, unavailable at this time. Will continue to follow POC at later date/time as pt available.   Kathie Dike, M.S. OTR/L  04/02/23, 12:11 PM  ascom [phone]

## 2023-04-02 NOTE — Progress Notes (Signed)
[facility] CLINIC CARDIOLOGY PROGRESS NOTE       Patient ID: Samantha Freeman MRN: [id number] DOB/AGE: June 02, 1935 87 y.o.  Admit date: 03/28/2023 Referring Physician Dr. Doree Albee Primary Physician Sparks, Duane Lope, MD  Primary Cardiologist Dr. Darrold Junker Reason for Consultation AoCHF, AF RVR  HPI: Samantha Freeman is a 87 y.o. female  with a past medical history of  mild aortic stenosis, bradycardia, type 2 diabetes, hypercholesterolemia  who presented to the ED on 03/28/2023 for shortness of breath, fatigue. Cardiology was consulted for further evaluation.   Interval history: -Patient reports feeling ok this AM. Endorses fatigue. -Continues to report SOB, states this is from not being able to breath through her nose. Remains on supplemental O2.  -Remains in AF with controlled rate. Denies CP, palpitations.  -Going for embolization today for epistaxis with vascular.   Review of systems complete and found to be negative unless listed above    Past Medical History:  Diagnosis Date   Anemia    Anxiety    Aortic stenosis, mild    Arthritis    CKD (chronic kidney disease), stage III (HCC)    Complication of anesthesia    Propofol anaphylaxis   Cortical cataract    DDD (degenerative disc disease), lumbar    Depression    Dyspnea    GERD (gastroesophageal reflux disease)    Headache    Heart murmur    History of 2019 novel coronavirus disease (COVID-19) 12/01/2018   HLD (hyperlipidemia)    Hypertension    Pneumonia    Schatzki's ring    Seasonal allergies    T2DM (type 2 diabetes mellitus) (HCC)    Valvular insufficiency     Past Surgical History:  Procedure Laterality Date   ABDOMINAL HYSTERECTOMY     APPENDECTOMY     BICEPT TENODESIS Right 05/13/2018   Procedure: BICEPS TENODESIS;  Surgeon: Christena Flake, MD;  Location: [facility] ORS;  Service: Orthopedics;  Laterality: Right;   CARDIAC CATHETERIZATION     COLONOSCOPY     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    EYE SURGERY Right 09/2013   Corneal transplant   JOINT REPLACEMENT Right 2015   knee   REVERSE SHOULDER ARTHROPLASTY Left 10/13/2020   Procedure: REVERSE SHOULDER ARTHROPLASTY;  Surgeon: Christena Flake, MD;  Location: [facility] ORS;  Service: Orthopedics;  Laterality: Left;   SHOULDER ARTHROSCOPY WITH ROTATOR CUFF REPAIR Right 05/13/2018   Procedure: SHOULDER ARTHROSCOPY WITH DEBRIDEMENT, DECOMPRESSION AND ROTATOR CUFF REPAIR;  Surgeon: Christena Flake, MD;  Location: [facility] ORS;  Service: Orthopedics;  Laterality: Right;   TONSILLECTOMY      Medications Prior to Admission  Medication Sig Dispense Refill Last Dose   ALPRAZolam (XANAX) 0.5 MG tablet Take 0.5 mg by mouth at bedtime as needed for anxiety or sleep.   03/27/2023   aspirin EC 81 MG tablet Take 1 tablet (81 mg total) by mouth daily. Swallow whole. 30 tablet 0 03/27/2023   Calcium Carb-Cholecalciferol (CALCIUM 600/VITAMIN D3 PO) Take 1 tablet by mouth daily.   03/27/2023   Coenzyme Q10 10 MG capsule Take 10 mg by mouth every morning.   03/27/2023   dapagliflozin propanediol (FARXIGA) 10 MG TABS tablet Take 10 mg by mouth daily.   03/27/2023   digoxin (LANOXIN) 0.125 MG tablet Take 1 tablet (0.125 mg total) by mouth every other day. 15 tablet 0 03/27/2023   diltiazem (CARDIZEM CD) 180 MG 24 hr capsule Take 1 capsule (180 mg total) by mouth daily.  30 capsule 0 03/27/2023   fenofibrate (TRICOR) 48 MG tablet Take 1 tablet by mouth daily.   03/27/2023   furosemide (LASIX) 20 MG tablet Take 20 mg by mouth daily as needed for fluid or edema.   prn at unknown   gabapentin (NEURONTIN) 100 MG capsule Take 200 mg by mouth 3 (three) times daily.   03/27/2023   hydrOXYzine (ATARAX) 25 MG tablet Take 1 tablet by mouth 2 (two) times daily.   03/27/2023   insulin lispro (HUMALOG) 100 UNIT/ML injection Inject 7-19 Units into the skin 3 (three) times daily before meals. Blood Glucose level: 140-199 - 7 units, 200-250 - 9 units, 251-299 - 13 units,  300-349 - 17 units,  350  or above 19 units.   03/27/2023   LANTUS SOLOSTAR 100 UNIT/ML Solostar Pen Inject 36 Units into the skin at bedtime.   03/27/2023   magnesium oxide (MAG-OX) 400 MG tablet Take 800 mg by mouth 3 (three) times daily.   03/27/2023   metoprolol tartrate (LOPRESSOR) 50 MG tablet Take 1 tablet (50 mg total) by mouth 2 (two) times daily. 60 tablet 0 03/27/2023   Multiple Vitamin (MULTIVITAMIN) tablet Take 1 tablet by mouth daily. Women's Daily Multivitamin   03/27/2023   pantoprazole (PROTONIX) 40 MG tablet Take 1 tablet (40 mg total) by mouth daily.   03/27/2023   PARoxetine (PAXIL) 10 MG tablet Take 10 mg by mouth daily.   03/27/2023   prednisoLONE acetate (PRED FORTE) 1 % ophthalmic suspension Place 1 drop into both eyes at bedtime.   03/27/2023   rOPINIRole (REQUIP) 1 MG tablet Take 1 mg by mouth at bedtime.   03/27/2023   rosuvastatin (CRESTOR) 10 MG tablet Take 10 mg by mouth daily.   03/27/2023   vitamin B-12 (CYANOCOBALAMIN) 1000 MCG tablet Take 1,000 mcg by mouth daily.   03/27/2023   blood glucose meter kit and supplies KIT Dispense based on patient and insurance preference. Use up to four times daily as directed. (FOR ICD-9 250.00, 250.01). For QAC - HS accuchecks. 1 each 1    ferrous sulfate 325 (65 FE) MG tablet Take 325 mg by mouth daily with breakfast. (Patient not taking: Reported on 02/25/2023)      glucose blood (FREESTYLE LITE) test strip For glucose testing every before meals at bedtime. Diagnosis E 11.65  Can substitute to any accepted brand 100 each 0    Insulin Syringe-Needle U-100 25G X 1" 1 ML MISC For 4 times a day insulin SQ, 1 month supply. Diagnosis E11.65 30 each 0    traZODone (DESYREL) 50 MG tablet Take 50 mg by mouth at bedtime. (Patient not taking: Reported on 03/28/2023)   Not Taking   Social History   Socioeconomic History   Marital status: Widowed    Spouse name: Not on file   Number of children: Not on file   Years of education: Not on file   Highest education level: Not  on file  Occupational History   Not on file  Tobacco Use   Smoking status: Never   Smokeless tobacco: Never  Vaping Use   Vaping status: Never Used  Substance and Sexual Activity   Alcohol use: Never   Drug use: Never   Sexual activity: Not Currently  Other Topics Concern   Not on file  Social History Narrative   Lives alone, has support from son who is a Optician, dispensing and daughter in Social worker. Will be with mother when she has surgery.   Social  Determinants of Health   Financial Resource Strain: Not on file  Food Insecurity: No Food Insecurity (03/28/2023)   Hunger Vital Sign    Worried About Running Out of Food in the Last Year: Never true    Ran Out of Food in the Last Year: Never true  Transportation Needs: No Transportation Needs (03/28/2023)   PRAPARE - Administrator, Civil Service (Medical): No    Lack of Transportation (Non-Medical): No  Physical Activity: Not on file  Stress: Not on file  Social Connections: Not on file  Intimate Partner Violence: Not At Risk (03/28/2023)   Humiliation, Afraid, Rape, and Kick questionnaire    Fear of Current or Ex-Partner: No    Emotionally Abused: No    Physically Abused: No    Sexually Abused: No    Family History  Problem Relation Age of Onset   Breast cancer Paternal Aunt      Vitals:   04/02/23 0726 04/02/23 0735 04/02/23 0811 04/02/23 1032  BP: (!) 135/101     Pulse: 82 66  86  Resp: (!) 23 19  17   Temp: 97.9 F (36.6 C)     TempSrc: Oral     SpO2: 90% 90% (!) 88% 99%  Weight:      Height:        PHYSICAL EXAM General: Ill appearing elderly female, well nourished, in no acute distress. HEENT: Normocephalic and atraumatic. Neck: No JVD.  Lungs: Normal respiratory effort on 7L HFNC. Clear bilaterally to auscultation. No wheezes, crackles, rhonchi.  Heart: Irregularly irregular. Normal S1 and S2 without gallops or murmurs.  Abdomen: Non-distended appearing.  Msk: Normal strength and tone for age. Extremities:  Warm and well perfused. No clubbing, cyanosis. No edema.  Neuro: Alert and oriented X 3. Psych: Answers questions appropriately.   Labs: Basic Metabolic Panel: Recent Labs    04/01/23 0604 04/02/23 0409  NA 132* 132*  K 4.0 3.9  CL 98 97*  CO2 24 27  GLUCOSE 296* 286*  BUN 25* 23  CREATININE 0.91 0.88  CALCIUM 9.1 9.1  MG 1.6* 1.4*   Liver Function Tests: No results for input(s): "AST", "ALT", "ALKPHOS", "BILITOT", "PROT", "ALBUMIN" in the last 72 hours.  No results for input(s): "LIPASE", "AMYLASE" in the last 72 hours. CBC: Recent Labs    04/01/23 0604 04/02/23 0409  WBC 6.5 7.0  HGB 13.7 12.7  HCT 41.3 38.2  MCV 88.2 90.3  PLT 226 235   Cardiac Enzymes: No results for input(s): "CKTOTAL", "CKMB", "CKMBINDEX", "TROPONINIHS" in the last 72 hours.  BNP: No results for input(s): "BNP" in the last 72 hours.  D-Dimer: No results for input(s): "DDIMER" in the last 72 hours. Hemoglobin A1C: No results for input(s): "HGBA1C" in the last 72 hours. Fasting Lipid Panel: No results for input(s): "CHOL", "HDL", "LDLCALC", "TRIG", "CHOLHDL", "LDLDIRECT" in the last 72 hours. Thyroid Function Tests: No results for input(s): "TSH", "T4TOTAL", "T3FREE", "THYROIDAB" in the last 72 hours.  Invalid input(s): "FREET3" Anemia Panel: No results for input(s): "VITAMINB12", "FOLATE", "FERRITIN", "TIBC", "IRON", "RETICCTPCT" in the last 72 hours.   Radiology: CT Angio Chest PE W and/or Wo Contrast  Result Date: 03/28/2023 CLINICAL DATA:  Shortness of breath EXAM: CT ANGIOGRAPHY CHEST WITH CONTRAST TECHNIQUE: Multidetector CT imaging of the chest was performed using the standard protocol during bolus administration of intravenous contrast. Multiplanar CT image reconstructions and MIPs were obtained to evaluate the vascular anatomy. RADIATION DOSE REDUCTION: This exam was performed according to  the departmental dose-optimization program which includes automated exposure control,  adjustment of the mA and/or kV according to patient size and/or use of iterative reconstruction technique. CONTRAST:  75mL OMNIPAQUE IOHEXOL 350 MG/ML SOLN COMPARISON:  CT angiogram 02/25/2023.  X-ray 03/28/2023 and older. FINDINGS: Cardiovascular: Heart is enlarged. Trace pericardial fluid. Coronary artery calcifications are seen. Minimal contrast opacification along the thoracic aorta. Grossly normal course and caliber with vascular calcifications. There is some dilatation of the main pulmonary artery. Please correlate for pulmonary artery hypertension. There is a pulmonary embolism identified in the upper aspect of the middle lobe as seen on series 6, image 213. This has not seen on the previous examination. There is also embolus along the lingula on series 6, image 180, not seen previously. Overall mild clot burden. No larger or more central embolus. Some limited evaluation related to motion. Mediastinum/Nodes: Patulous esophagus. Heterogeneous thyroid gland. There are several prominent nodes identified in the mediastinum and hilum bilaterally, similar to previous. No axillary nodes. Lungs/Pleura: Trace pleural fluid with the adjacent parenchymal opacities. The opacities are increasing from previous at the lower lobes. There are also some dependent areas in the upper lobes. There is some new nodules identified as well in the left upper lobe measuring 10 mm on series 5, image 38. With the time course favor a infectious or inflammatory process as this has not seen previously. There are areas of bronchial wall thickening, mucous plugging and interstitial septal thickening. Few areas of ground-glass as well. Upper Abdomen: The adrenal glands are preserved in the upper abdomen. Stomach is relatively collapsed punctate nonobstructing right-sided renal stone at the edge of the imaging field. Musculoskeletal: Surgical changes about the left shoulder. Scattered degenerative changes along the spine. Review of the MIP  images confirms the above findings. Critical Value/emergent results were called by telephone at the time of interpretation on 03/28/2023 at 4:21 pm EST to provider Dr. Alvester Morin, who verbally acknowledged these results. IMPRESSION: New bilateral few segmental and smaller pulmonary emboli. Mild clot burden. No larger or more central embolus. There is some enlargement of the main pulmonary arteries. Please correlate for pulmonary artery hypertension. This has seen previously. Enlarged heart. Increasing parenchymal bilateral lung opacities identified with some subtle nodularity, ground-glass and interstitial septal thickening. Few presumed reactive nodes. Recommend follow up to confirm resolution. Persistent tiny pleural effusions, left-greater-than-right. Aortic Atherosclerosis (ICD10-I70.0). Electronically Signed   By: Karen Kays M.D.   On: 03/28/2023 16:42   CT HEAD WO CONTRAST ( )  Result Date: 03/28/2023 CLINICAL DATA:  Mental status change, unknown cause. EXAM: CT HEAD WITHOUT CONTRAST TECHNIQUE: Contiguous axial images were obtained from the base of the skull through the vertex without intravenous contrast. RADIATION DOSE REDUCTION: This exam was performed according to the departmental dose-optimization program which includes automated exposure control, adjustment of the mA and/or kV according to patient size and/or use of iterative reconstruction technique. COMPARISON:  None Available. FINDINGS: Brain: There is no evidence of an acute infarct, intracranial hemorrhage, mass, midline shift, or extra-axial fluid collection. Mild cerebral atrophy is within normal limits for age. Hypodensities in the cerebral white matter nonspecific but compatible with mild chronic small vessel ischemic disease. Vascular: Calcified atherosclerosis at the skull base. No hyperdense vessel. Skull: No acute fracture or suspicious osseous lesion. Sinuses/Orbits: Chronic left sphenoid sinusitis with near complete opacification. Mild  mucosal thickening in the paranasal sinuses elsewhere. Clear mastoid air cells. Bilateral cataract extraction. Other: None. IMPRESSION: 1. No evidence of acute intracranial abnormality. 2. Mild chronic small  vessel ischemic disease. Electronically Signed   By: Sebastian Ache M.D.   On: 03/28/2023 15:52   DG Chest Portable 1 View  Result Date: 03/28/2023 CLINICAL DATA:  Shortness of breath EXAM: PORTABLE CHEST - 1 VIEW COMPARISON:  02/25/2023 FINDINGS: Unchanged borderline cardiomegaly. No significant pulmonary vascular congestion. Unchanged mild left basilar opacity favored to be atelectasis. Reversed left total shoulder prosthesis again seen. IMPRESSION: 1. Unchanged mild cardiomegaly. 2. Unchanged left basilar opacity likely due to atelectasis. Electronically Signed   By: Acquanetta Belling M.D.   On: 03/28/2023 11:42    ECHO 01/2023: 1. Left ventricular ejection fraction, by estimation, is 60 to 65%. The left ventricle has normal function. The left ventricle has no regional wall motion abnormalities. There is moderate left ventricular hypertrophy. Indeterminate diastolic filling due  to E-A fusion.   2. Right ventricular systolic function is normal. The right ventricular size is normal.   3. Left atrial size was moderately dilated.   4. Right atrial size was mildly dilated.   5. The mitral valve is normal in structure. Mild mitral valve  regurgitation. No evidence of mitral stenosis.   6. The aortic valve is normal in structure. Aortic valve regurgitation is  not visualized. Mild aortic valve stenosis.   7. The inferior vena cava is normal in size with greater than 50%  respiratory variability, suggesting right atrial pressure of 3 mmHg.   TELEMETRY reviewed by me 04/02/2023: atrial fibrillation rate 90s  EKG reviewed by me: atrial fibrillation RVR rate 118 bom  Data reviewed by me 04/02/2023: last 24h vitals tele labs imaging I/O hospitalist progress note, ENT notes, vascular surgery  notes  Principal Problem:   Acute respiratory failure with hypoxia (HCC) Active Problems:   Chronic kidney disease, stage 3a (HCC)   DM (diabetes mellitus) (HCC)   HTN (hypertension)   Acute hypoxic respiratory failure (HCC)   Atrial fibrillation with RVR (HCC)   GERD (gastroesophageal reflux disease)   Acute heart failure with preserved ejection fraction (HFpEF) (HCC)   Encephalopathy   Epistaxis    ASSESSMENT AND PLAN:  Samantha Freeman is a 87 y.o. female  with a past medical history of  mild aortic stenosis, bradycardia, type 2 diabetes, hypercholesterolemia  who presented to the ED on 03/28/2023 for shortness of breath, fatigue. Cardiology was consulted for further evaluation.   # Acute hypoxic respiratory failure # Acute on chronic HFpEF # Acute pulmonary emboli Patient with worsening SOB and fatigue over the last few days, brought to ED yesterday. Initially required BiPAP due to hypoxia now on 8L HFNC. BNP elevated at 661. Troponins negative x2 at 9 > 9. CXR without significant pulmonary edema. CTA chest with few bilateral segmental and small PE, mild clot burden. -Continue IV heparin. Patient with epistaxis on DOAC before and now with heparin, scheduled for embolization today with vascular surgery.  -Continue lasix 20 mg daily.  -Further management of PE per primary.   # Persistent atrial fibrillation # Atrial fibrillation with rapid ventricular response Patient with hx of AF, multiple recent hospitalizations with difficult to control rate. Most recently was placed on metoprolol, diltiazem, and digoxin for rate control. Dig level reported at 2.6 but this was corrected to 0.2 per lab.  -Restart digoxin this AM. -Continue diltiazem 90 mg q6 hours, will start consolidated dosing of 360 mg once daily tomorrow AM. Continue metoprolol 50 mg twice daily.  -Continue heparin, would benefit from DOAC on DC. -Not a candidate for cardioversion as she would need  TEE prior but given oxygen  requirement this would be higher risk for complication. If she is amenable to discharging home on anticoagulation then could consider DCCV or antiarrhythmic medication in future.  # Hyperlipidemia -Continue rosuvastatin 10 mg daily and aspirin 81 mg daily.    This patient's plan of care was discussed and created with Dr. Juliann Pares and he is in agreement.  Signed: Gale Journey, PA-C  04/02/2023, 10:36 AM [facility]

## 2023-04-02 NOTE — Inpatient Diabetes Management (Signed)
Inpatient Diabetes Program Recommendations  AACE/ADA: New Consensus Statement on Inpatient Glycemic Control (2015)  Target Ranges:  Prepandial:   less than 140 mg/dL      Peak postprandial:   less than 180 mg/dL (1-2 hours)      Critically ill patients:  140 - 180 mg/dL   Lab Results  Component Value Date   GLUCAP 293 (H) 04/02/2023   HGBA1C 7.6 (H) 01/31/2023    Latest Reference Range & Units 04/01/23 08:36 04/01/23 11:38 04/01/23 16:32 04/01/23 21:02 04/02/23 08:35  Glucose-Capillary 70 - 99 mg/dL 295 (H) 621 (H) 308 (H) 282 (H) 293 (H)  (H): Data is abnormally high  Review of Glycemic Control  Diabetes history: DM2 Home DM Meds: Lantus 36 units at bedtime     Humalog 7-19 units TID     Farxiga 10 mg daily  Current orders for Inpatient glycemic control: Semglee 25 units nightly,Novolog 4 units tid meal coverage,  Novolog 0-9 units tid  Inpatient Diabetes Program Recommendations:   Please consider: -Increase Semglee to 30 units nightly -increase Novolog meal coverage to 6 units tid if eats 50%  Thank you, Darel Hong E. Jaspal Pultz, RN, MSN, CDCES  Diabetes Coordinator Inpatient Glycemic Control Team Team Pager [phone] (8am-5pm) 04/02/2023 10:29 AM

## 2023-04-02 NOTE — Progress Notes (Signed)
PT Cancellation Note  Patient Details Name: Samantha Freeman MRN: [id number] DOB: 1936-03-23   Cancelled Treatment:    Reason Eval/Treat Not Completed: Patient at procedure or test/unavailable (PT will continue with attempts.)  Donna Bernard, PT, MPT  Ina Homes 04/02/2023, 1:11 PM

## 2023-04-02 NOTE — Progress Notes (Signed)
PROGRESS NOTE    [name]  ZOX:[id number] DOB: [birth year] DOA: 03/28/2023 PCP: Marguarite Arbour, MD  [id number]  LOS: 5 days   Brief hospital course:   Assessment & Plan: Samantha Freeman is a 87 y.o. female with medical history significant of atrial fibrillation not on AC, HFpEF, type 2 diabetes, CKD stage IIIa, presented with acute respiratory failure hypoxia, A-fib with RVR, acute on chronic HFpEF.  Patient reports increased work of breathing over the past 1 to 2 days.    Atrial fibrillation with RVR (HCC) --3 hospitalizations in 2 months for the same.  Most recently was placed on metoprolol, diltiazem, and digoxin for rate control.  Not on anticoagulation due to hx/o epistaxis.   --started on dilt gtt, [facility] cardiology consulted.  Short-acting dilt titrated up.   Plan: --cont oral dilt 90 mg q8h --cont metop 50 BID --resume home digoxin today --cont heparin gtt for now, with plan to transition to DOAC after embolizations done for epistaxis. --cardio to consider cardioversion in the future  Recurrent epistaxis --in response to anticoagulation, left worse than right.  ENT consulted.  Per ENT, has been "packed cauterized 3x by [place] in last 2 months."  This prevented pt from taking DOAC, therefore vascular surgery consulted for embolization.   Plan: --If bleeds, recommend pressure with clamp, Afrin, humidification.  --embolization of left maxillary artery today --will attempt embolization of the right at some later date.  Acute hypoxic respiratory failure (HCC) Decompensated respiratory failure requiring initially BiPAP and now transition to high flow nasal cannula 8L. --in the setting A-fib with RVR and acute on chronic HFpEF and PE. --O2 requirement slowly improving Plan: --treat underlying causes --Continue supplemental O2 to keep sats >=92%, wean as tolerated  Acute PE --New bilateral few segmental and smaller pulmonary emboli.  --cont heparin gtt  Acute heart  failure with preserved ejection fraction (HFpEF) (HCC) 2D echo November 2024 with EF of 60 to 65% BNP 600s with positive cardiomegaly on chest x-ray 1-2+ pitting edema bilaterally --s/p IV lasix 40 x1 in ED and IV lasix 20 x1 per cardio Plan: --further diuresis per cardio  Encephalopathy Reported patient with transient episode of hallucination Appears to be alert at the bedside Will monitor for now CT head pending Add on VBG in the setting of hypoxia As needed Zyprexa for agitation Monitor  GERD (gastroesophageal reflux disease) PPI   HTN (hypertension) --on rate control agents as above  DM (diabetes mellitus) (HCC) --increase glargine to 30u nightly --increase mealtime to 6u TID --ACHS and SSI  Chronic kidney disease, stage 3a (HCC) Cr 1.3 w/ GFR in 40s  Looks to be near baseline  Monitor    DVT prophylaxis: [id number] gtt Code Status: DNR  Family Communication: cousin updated at bedside today Level of care: Progressive Dispo:   The patient is from: home Anticipated d/c is to: home Anticipated d/c date is: to be determined    Subjective and Interval History:  Pt underwent embolization of left maxillary artery today, tolerated it well, but had some headache afterwards.   Objective: Vitals:   04/02/23 1415 04/02/23 1430 04/02/23 1453 04/02/23 1735  BP: 127/89 131/89 124/68 117/76  Pulse: 90 93 80   Resp: (!) 23 (!) 21 (!) 21   Temp:   97.8 F (36.6 C)   TempSrc:   Oral   SpO2: (!) 88% (!) 88% (!) 88%   Weight:      Height:        Intake/Output Summary (Last 24  hours) at 04/02/2023 1925 Last data filed at 04/02/2023 1605 Gross per 24 hour  Intake 617.94 ml  Output 850 ml  Net -232.06 ml   Filed Weights   03/28/23 1029 03/28/23 2145  Weight: 86.1 kg 81 kg    Examination:   Constitutional: NAD, AAOx3 HEENT: conjunctivae and lids normal, EOMI CV: No cyanosis.   RESP: normal respiratory effort Neuro: II - XII grossly intact.   Psych: Normal  mood and affect.  Appropriate judgement and reason   Data Reviewed: I have personally reviewed labs and imaging studies  Time spent: 35 minutes  Darlin Priestly, MD [facility] If 7PM-7AM, please contact night-coverage 04/02/2023, 7:25 PM

## 2023-04-03 ENCOUNTER — Encounter: Payer: Self-pay | Admitting: Vascular Surgery

## 2023-04-03 DIAGNOSIS — I482 Chronic atrial fibrillation, unspecified: Secondary | ICD-10-CM | POA: Diagnosis not present

## 2023-04-03 DIAGNOSIS — J9601 Acute respiratory failure with hypoxia: Secondary | ICD-10-CM | POA: Diagnosis not present

## 2023-04-03 LAB — CBC
HCT: 39.8 % (ref 36.0–46.0)
Hemoglobin: 13.2 g/dL (ref 12.0–15.0)
MCH: 29.9 pg (ref 26.0–34.0)
MCHC: 33.2 g/dL (ref 30.0–36.0)
MCV: 90.2 fL (ref 80.0–100.0)
Platelets: 256 10*3/uL (ref 150–400)
RBC: 4.41 MIL/uL (ref 3.87–5.11)
RDW: 14.6 % (ref 11.5–15.5)
WBC: 8.8 10*3/uL (ref 4.0–10.5)
nRBC: 0 % (ref 0.0–0.2)

## 2023-04-03 LAB — BASIC METABOLIC PANEL
Anion gap: 8 (ref 5–15)
BUN: 20 mg/dL (ref 8–23)
CO2: 26 mmol/L (ref 22–32)
Calcium: 8.9 mg/dL (ref 8.9–10.3)
Chloride: 97 mmol/L — ABNORMAL LOW (ref 98–111)
Creatinine, Ser: 0.79 mg/dL (ref 0.44–1.00)
GFR, Estimated: >60 mL/min (ref >60)
Glucose, Bld: 272 mg/dL — ABNORMAL HIGH (ref 70–99)
Potassium: 3.9 mmol/L (ref 3.5–5.1)
Sodium: 131 mmol/L — ABNORMAL LOW (ref 135–145)

## 2023-04-03 LAB — HEPARIN LEVEL (UNFRACTIONATED): Heparin Unfractionated: 0.58 [IU]/mL (ref 0.30–0.70)

## 2023-04-03 LAB — MAGNESIUM: Magnesium: 1.8 mg/dL (ref 1.7–2.4)

## 2023-04-03 LAB — GLUCOSE, CAPILLARY
Glucose-Capillary: 252 mg/dL — ABNORMAL HIGH (ref 70–99)
Glucose-Capillary: 387 mg/dL — ABNORMAL HIGH (ref 70–99)
Glucose-Capillary: 416 mg/dL — ABNORMAL HIGH (ref 70–99)
Glucose-Capillary: 288 mg/dL — ABNORMAL HIGH (ref 70–99)
Glucose-Capillary: 230 mg/dL — ABNORMAL HIGH (ref 70–99)

## 2023-04-03 MED ORDER — INSULIN ASPART 100 UNIT/ML IJ SOLN
0.0000 [IU] | Freq: Three times a day (TID) | INTRAMUSCULAR | Status: DC
  Administered 2023-04-03 – 2023-04-04 (×2): 8 [IU] via SUBCUTANEOUS
  Administered 2023-04-04: 2 [IU] via SUBCUTANEOUS
  Administered 2023-04-04: 5 [IU] via SUBCUTANEOUS
  Administered 2023-04-05: 8 [IU] via SUBCUTANEOUS
  Administered 2023-04-05: 3 [IU] via SUBCUTANEOUS
  Administered 2023-04-05 – 2023-04-06 (×2): 11 [IU] via SUBCUTANEOUS
  Administered 2023-04-06 (×2): 5 [IU] via SUBCUTANEOUS
  Administered 2023-04-07: 11 [IU] via SUBCUTANEOUS
  Administered 2023-04-07: 15 [IU] via SUBCUTANEOUS
  Administered 2023-04-07: 8 [IU] via SUBCUTANEOUS
  Administered 2023-04-08: 3 [IU] via SUBCUTANEOUS
  Administered 2023-04-08: 11 [IU] via SUBCUTANEOUS
  Filled 2023-04-03 (×9): qty 1

## 2023-04-03 NOTE — Progress Notes (Signed)
PROGRESS NOTE    Samantha Freeman  GNF:[id number] DOB: 11/08/1935 DOA: 03/28/2023 PCP: Marguarite Arbour, MD    Assessment & Plan:   Principal Problem:   Acute respiratory failure with hypoxia (HCC) Active Problems:   Atrial fibrillation with RVR (HCC)   Acute hypoxic respiratory failure (HCC)   Acute heart failure with preserved ejection fraction (HFpEF) (HCC)   Chronic kidney disease, stage 3a (HCC)   DM (diabetes mellitus) (HCC)   HTN (hypertension)   GERD (gastroesophageal reflux disease)   Encephalopathy   Epistaxis  Assessment and Plan: Persistent a. fib: continue on dilt, digoxin, metoprolol & IV heparin as per cardio. Not a candidate for cardioversion as she would need TEE prior but given oxygen requirement pt would be at higher risk as per cardio. Cardio following and recs   Recurrent epistaxis: s/p left maxillary artery embolization as per ENT. Likely secondary to IV heparin. Hx of nose bleeds. Continue w/ humidified oxygen & nasal spray.  ENT following and recs apprec   Acute hypoxic respiratory failure: likely secondary to acute PE & acute on chronic diastolic CHF. Continue on supplemental oxygen and wean as tolerated.   Acute PE: continue on IV heparin.    Acute on chronic diastolic CHF: echo w/ EF 6-65%. 1=2+ pitting edema. S/p IV lasix. Monitor I/Os. Continue on lasix as per cardio    Encephalopathy: w/ transient episode of a hallucination. Mental status is back to baseline today. CT head shows no acute intracranial abnormalities    GERD: continue on PPI    HTN: continue metoprolol,diltiazem    DM2: fair control, HbA1c 7.6. Continue on glargine, SSI w/ accuchecks    CKDIIIa: Cr is labile. Avoid nephrotoxic meds       DVT prophylaxis: IV heparin  Code Status: DNR  Family Communication:  Disposition Plan: SNF  Level of care: Progressive  Status is: Inpatient Remains inpatient appropriate because: severity of illness    Consultants:  Cardio    Procedures:  Antimicrobials:   Subjective: Pt c/o stuffy nose   Objective: Vitals:   04/02/23 1946 04/02/23 2339 04/03/23 0443 04/03/23 0735  BP: (!) 142/64 (!) 141/86  (!) 135/97  Pulse:   66   Resp: 20 18 18    Temp: 97.6 F (36.4 C) 98 F (36.7 C) 98.3 F (36.8 C) 98.8 F (37.1 C)  TempSrc: Oral Oral Oral Axillary  SpO2: 94% 92% 92%   Weight:      Height:        Intake/Output Summary (Last 24 hours) at 04/03/2023 0819 Last data filed at 04/03/2023 0747 Gross per 24 hour  Intake 519.19 ml  Output 800 ml  Net -280.81 ml   Filed Weights   03/28/23 1029 03/28/23 2145  Weight: 86.1 kg 81 kg    Examination:  General exam: Appears calm and comfortable  Respiratory system: decreased breath sounds b/l  Cardiovascular system: irregularly irregular.  No  rubs, gallops or clicks. Gastrointestinal system: Abdomen is nondistended, soft and nontender. Normal bowel sounds heard. Central nervous system: Alert and oriented. Moves all extremities  Psychiatry: Judgement and insight appear normal. Mood & affect appropriate.     Data Reviewed: I have personally reviewed following labs and imaging studies  CBC: Recent Labs  Lab 03/28/23 1038 03/29/23 0202 03/30/23 0637 03/31/23 0622 04/01/23 0604 04/02/23 0409 04/03/23 0441  WBC 11.3*   < > 9.4 8.3 6.5 7.0 8.8  NEUTROABS 8.2*  --   --   --   --   --   --  HGB 14.6   < > 13.8 14.4 13.7 12.7 13.2  HCT 44.9   < > 42.1 43.2 41.3 38.2 39.8  MCV 91.4   < > 89.6 89.1 88.2 90.3 90.2  PLT 210   < > 219 234 226 235 256   < > = values in this interval not displayed.   Basic Metabolic Panel: Recent Labs  Lab 03/30/23 0637 03/31/23 0622 04/01/23 0604 04/02/23 0409 04/03/23 0441  NA 134* 132* 132* 132* 131*  K 3.7 3.8 4.0 3.9 3.9  CL 96* 94* 98 97* 97*  CO2 28 26 24 27 26   GLUCOSE 204* 263* 296* 286* 272*  BUN 24* 26* 25* 23 20  CREATININE 1.12* 0.98 0.91 0.88 0.79  CALCIUM 9.0 9.3 9.1 9.1 8.9  MG 1.8 1.8 1.6* 1.4*  1.8   GFR: Estimated Creatinine Clearance: 52.9 mL/min (by C-G formula based on SCr of 0.79 mg/dL). Liver Function Tests: Recent Labs  Lab 03/29/23 0202  AST 16  ALT 13  ALKPHOS 46  BILITOT 1.2*  PROT 6.7  ALBUMIN 3.5   No results for input(s): "LIPASE", "AMYLASE" in the last 168 hours. No results for input(s): "AMMONIA" in the last 168 hours. Coagulation Profile: Recent Labs  Lab 03/28/23 1736  INR 1.2   Cardiac Enzymes: No results for input(s): "CKTOTAL", "CKMB", "CKMBINDEX", "TROPONINI" in the last 168 hours. BNP (last 3 results) No results for input(s): "PROBNP" in the last 8760 hours. HbA1C: No results for input(s): "HGBA1C" in the last 72 hours. CBG: Recent Labs  Lab 04/01/23 2102 04/02/23 0835 04/02/23 1305 04/02/23 1646 04/02/23 2023  GLUCAP 282* 293* 224* 246* 268*   Lipid Profile: No results for input(s): "CHOL", "HDL", "LDLCALC", "TRIG", "CHOLHDL", "LDLDIRECT" in the last 72 hours. Thyroid Function Tests: No results for input(s): "TSH", "T4TOTAL", "FREET4", "T3FREE", "THYROIDAB" in the last 72 hours. Anemia Panel: No results for input(s): "VITAMINB12", "FOLATE", "FERRITIN", "TIBC", "IRON", "RETICCTPCT" in the last 72 hours. Sepsis Labs: Recent Labs  Lab 03/28/23 2241 03/29/23 0202  PROCALCITON <0.10  --   LATICACIDVEN 1.2 1.0    Recent Results (from the past 240 hour(s))  SARS Coronavirus 2 by RT PCR (hospital order, performed in [facility] hospital lab) *cepheid single result test* Anterior Nasal Swab     Status: None   Collection Time: 03/28/23 10:38 AM   Specimen: Anterior Nasal Swab  Result Value Ref Range Status   SARS Coronavirus 2 by RT PCR NEGATIVE NEGATIVE Final    Comment: (NOTE) SARS-CoV-2 target nucleic acids are NOT DETECTED.  The SARS-CoV-2 RNA is generally detectable in upper and lower respiratory specimens during the acute phase of infection. The lowest concentration of SARS-CoV-2 viral copies this assay can detect is  250 copies / mL. A negative result does not preclude SARS-CoV-2 infection and should not be used as the sole basis for treatment or other patient management decisions.  A negative result may occur with improper specimen collection / handling, submission of specimen other than nasopharyngeal swab, presence of viral mutation(s) within the areas targeted by this assay, and inadequate number of viral copies (<250 copies / mL). A negative result must be combined with clinical observations, patient history, and epidemiological information.  Fact Sheet for Patients:   RoadLapTop.co.za  Fact Sheet for Healthcare Providers: http://kim-miller.com/  This test is not yet approved or  cleared by the [place] FDA and has been authorized for detection and/or diagnosis of SARS-CoV-2 by FDA under an Emergency Use Authorization (EUA).  This  EUA will remain in effect (meaning this test can be used) for the duration of the COVID-19 declaration under Section 564(b)(1) of the Act, 21 U.S.C. section 360bbb-3(b)(1), unless the authorization is terminated or revoked sooner.  Performed at [facility], [address] [place], [place] [address]   MRSA Next Gen by PCR, Nasal     Status: None   Collection Time: 03/28/23  9:59 PM   Specimen: Nasal Mucosa; Nasal Swab  Result Value Ref Range Status   MRSA by PCR Next Gen NOT DETECTED NOT DETECTED Final    Comment: (NOTE) The GeneXpert MRSA Assay (FDA approved for NASAL specimens only), is one component of a comprehensive MRSA colonization surveillance program. It is not intended to diagnose MRSA infection nor to guide or monitor treatment for MRSA infections. Test performance is not FDA approved in patients less than 53 years old. Performed at [facility], [address] [place], [place] [address]          Radiology Studies: PERIPHERAL VASCULAR CATHETERIZATION  Result Date:  04/02/2023 See surgical note for result.       Scheduled Meds:  aspirin EC  81 mg Oral Daily   digoxin  0.125 mg Oral QODAY   diltiazem  360 mg Oral Daily   feeding supplement (GLUCERNA SHAKE)  237 mL Oral TID BM   furosemide  20 mg Oral Daily   gabapentin  200 mg Oral TID   insulin aspart  0-9 Units Subcutaneous TID WC   insulin aspart  6 Units Subcutaneous TID WC   insulin glargine-yfgn  30 Units Subcutaneous QHS   metoprolol tartrate  50 mg Oral BID   mupirocin ointment   Nasal BID   pantoprazole  40 mg Oral Daily   PARoxetine  10 mg Oral Daily   prednisoLONE acetate  1 drop Both Eyes QHS   rOPINIRole  1 mg Oral QHS   rosuvastatin  10 mg Oral Daily   sodium chloride  2 spray Each Nare QID   Continuous Infusions:  sodium chloride     heparin 1,150 Units/hr (04/03/23 0707)     LOS: 6 days       Charise Killian, MD [facility] Pager [phone]  If 7PM-7AM, please contact night-coverage [email or web address] 04/03/2023, 8:19 AM

## 2023-04-03 NOTE — Progress Notes (Signed)
[facility] CLINIC CARDIOLOGY PROGRESS NOTE       Patient ID: Samantha Freeman MRN: [id number] DOB/AGE: 1935/11/06 87 y.o.  Admit date: 03/28/2023 Referring Physician Dr. Doree Albee Primary Physician Sparks, Duane Lope, MD  Primary Cardiologist Dr. Darrold Junker Reason for Consultation AoCHF, AF RVR  HPI: Ota Racer is a 87 y.o. female  with a past medical history of  mild aortic stenosis, bradycardia, type 2 diabetes, hypercholesterolemia  who presented to the ED on 03/28/2023 for shortness of breath, fatigue. Cardiology was consulted for further evaluation.   Interval history: -Patient states she feels slightly better this AM, appears more comfortably. -Continues to report SOB related to her nose being congested, has been using saline nose spray. -Remains in AF with controlled rate. Denies CP, palpitations.   Review of systems complete and found to be negative unless listed above    Past Medical History:  Diagnosis Date   Anemia    Anxiety    Aortic stenosis, mild    Arthritis    CKD (chronic kidney disease), stage III (HCC)    Complication of anesthesia    Propofol anaphylaxis   Cortical cataract    DDD (degenerative disc disease), lumbar    Depression    Dyspnea    GERD (gastroesophageal reflux disease)    Headache    Heart murmur    History of 2019 novel coronavirus disease (COVID-19) 12/01/2018   HLD (hyperlipidemia)    Hypertension    Pneumonia    Schatzki's ring    Seasonal allergies    T2DM (type 2 diabetes mellitus) (HCC)    Valvular insufficiency     Past Surgical History:  Procedure Laterality Date   ABDOMINAL HYSTERECTOMY     APPENDECTOMY     BICEPT TENODESIS Right 05/13/2018   Procedure: BICEPS TENODESIS;  Surgeon: Christena Flake, MD;  Location: [facility] ORS;  Service: Orthopedics;  Laterality: Right;   CARDIAC CATHETERIZATION     COLONOSCOPY     EMBOLIZATION (CATH LAB) Bilateral 04/02/2023   Procedure: EMBOLIZATION;  Surgeon: Renford Dills, MD;   Location: [facility] INVASIVE CV LAB;  Service: Cardiovascular;  Laterality: Bilateral;   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   EYE SURGERY Right 09/2013   Corneal transplant   JOINT REPLACEMENT Right 2015   knee   REVERSE SHOULDER ARTHROPLASTY Left 10/13/2020   Procedure: REVERSE SHOULDER ARTHROPLASTY;  Surgeon: Christena Flake, MD;  Location: [facility] ORS;  Service: Orthopedics;  Laterality: Left;   SHOULDER ARTHROSCOPY WITH ROTATOR CUFF REPAIR Right 05/13/2018   Procedure: SHOULDER ARTHROSCOPY WITH DEBRIDEMENT, DECOMPRESSION AND ROTATOR CUFF REPAIR;  Surgeon: Christena Flake, MD;  Location: [facility] ORS;  Service: Orthopedics;  Laterality: Right;   TONSILLECTOMY      Medications Prior to Admission  Medication Sig Dispense Refill Last Dose   ALPRAZolam (XANAX) 0.5 MG tablet Take 0.5 mg by mouth at bedtime as needed for anxiety or sleep.   03/27/2023   aspirin EC 81 MG tablet Take 1 tablet (81 mg total) by mouth daily. Swallow whole. 30 tablet 0 03/27/2023   Calcium Carb-Cholecalciferol (CALCIUM 600/VITAMIN D3 PO) Take 1 tablet by mouth daily.   03/27/2023   Coenzyme Q10 10 MG capsule Take 10 mg by mouth every morning.   03/27/2023   dapagliflozin propanediol (FARXIGA) 10 MG TABS tablet Take 10 mg by mouth daily.   03/27/2023   digoxin (LANOXIN) 0.125 MG tablet Take 1 tablet (0.125 mg total) by mouth every other day. 15 tablet 0 03/27/2023  diltiazem (CARDIZEM CD) 180 MG 24 hr capsule Take 1 capsule (180 mg total) by mouth daily. 30 capsule 0 03/27/2023   fenofibrate (TRICOR) 48 MG tablet Take 1 tablet by mouth daily.   03/27/2023   furosemide (LASIX) 20 MG tablet Take 20 mg by mouth daily as needed for fluid or edema.   prn at unknown   gabapentin (NEURONTIN) 100 MG capsule Take 200 mg by mouth 3 (three) times daily.   03/27/2023   hydrOXYzine (ATARAX) 25 MG tablet Take 1 tablet by mouth 2 (two) times daily.   03/27/2023   insulin lispro (HUMALOG) 100 UNIT/ML injection Inject 7-19 Units into the skin 3  (three) times daily before meals. Blood Glucose level: 140-199 - 7 units, 200-250 - 9 units, 251-299 - 13 units,  300-349 - 17 units,  350 or above 19 units.   03/27/2023   LANTUS SOLOSTAR 100 UNIT/ML Solostar Pen Inject 36 Units into the skin at bedtime.   03/27/2023   magnesium oxide (MAG-OX) 400 MG tablet Take 800 mg by mouth 3 (three) times daily.   03/27/2023   metoprolol tartrate (LOPRESSOR) 50 MG tablet Take 1 tablet (50 mg total) by mouth 2 (two) times daily. 60 tablet 0 03/27/2023   Multiple Vitamin (MULTIVITAMIN) tablet Take 1 tablet by mouth daily. Women's Daily Multivitamin   03/27/2023   pantoprazole (PROTONIX) 40 MG tablet Take 1 tablet (40 mg total) by mouth daily.   03/27/2023   PARoxetine (PAXIL) 10 MG tablet Take 10 mg by mouth daily.   03/27/2023   prednisoLONE acetate (PRED FORTE) 1 % ophthalmic suspension Place 1 drop into both eyes at bedtime.   03/27/2023   rOPINIRole (REQUIP) 1 MG tablet Take 1 mg by mouth at bedtime.   03/27/2023   rosuvastatin (CRESTOR) 10 MG tablet Take 10 mg by mouth daily.   03/27/2023   vitamin B-12 (CYANOCOBALAMIN) 1000 MCG tablet Take 1,000 mcg by mouth daily.   03/27/2023   blood glucose meter kit and supplies KIT Dispense based on patient and insurance preference. Use up to four times daily as directed. (FOR ICD-9 250.00, 250.01). For QAC - HS accuchecks. 1 each 1    ferrous sulfate 325 (65 FE) MG tablet Take 325 mg by mouth daily with breakfast. (Patient not taking: Reported on 02/25/2023)      glucose blood (FREESTYLE LITE) test strip For glucose testing every before meals at bedtime. Diagnosis E 11.65  Can substitute to any accepted brand 100 each 0    Insulin Syringe-Needle U-100 25G X 1" 1 ML MISC For 4 times a day insulin SQ, 1 month supply. Diagnosis E11.65 30 each 0    traZODone (DESYREL) 50 MG tablet Take 50 mg by mouth at bedtime. (Patient not taking: Reported on 03/28/2023)   Not Taking   Social History   Socioeconomic History   Marital status:  Widowed    Spouse name: Not on file   Number of children: Not on file   Years of education: Not on file   Highest education level: Not on file  Occupational History   Not on file  Tobacco Use   Smoking status: Never   Smokeless tobacco: Never  Vaping Use   Vaping status: Never Used  Substance and Sexual Activity   Alcohol use: Never   Drug use: Never   Sexual activity: Not Currently  Other Topics Concern   Not on file  Social History Narrative   Lives alone, has support from son who is  a Optician, dispensing and daughter in Social worker. Will be with mother when she has surgery.   Social Determinants of Health   Financial Resource Strain: Not on file  Food Insecurity: No Food Insecurity (03/28/2023)   Hunger Vital Sign    Worried About Running Out of Food in the Last Year: Never true    Ran Out of Food in the Last Year: Never true  Transportation Needs: No Transportation Needs (03/28/2023)   PRAPARE - Administrator, Civil Service (Medical): No    Lack of Transportation (Non-Medical): No  Physical Activity: Not on file  Stress: Not on file  Social Connections: Not on file  Intimate Partner Violence: Not At Risk (03/28/2023)   Humiliation, Afraid, Rape, and Kick questionnaire    Fear of Current or Ex-Partner: No    Emotionally Abused: No    Physically Abused: No    Sexually Abused: No    Family History  Problem Relation Age of Onset   Breast cancer Paternal Aunt      Vitals:   04/02/23 1946 04/02/23 2339 04/03/23 0443 04/03/23 0735  BP: (!) 142/64 (!) 141/86  (!) 135/97  Pulse:   66   Resp: 20 18 18    Temp: 97.6 F (36.4 C) 98 F (36.7 C) 98.3 F (36.8 C) 98.8 F (37.1 C)  TempSrc: Oral Oral Oral Axillary  SpO2: 94% 92% 92%   Weight:      Height:        PHYSICAL EXAM General: Chronically ill appearing elderly female, well nourished, in no acute distress. HEENT: Normocephalic and atraumatic. Neck: No JVD.  Lungs: Normal respiratory effort on 6L HFNC. Clear  bilaterally to auscultation. No wheezes, crackles, rhonchi.  Heart: Irregularly irregular. Normal S1 and S2 without gallops or murmurs.  Abdomen: Non-distended appearing.  Msk: Normal strength and tone for age. Extremities: Warm and well perfused. No clubbing, cyanosis. No edema.  Neuro: Alert and oriented X 3. Psych: Answers questions appropriately.   Labs: Basic Metabolic Panel: Recent Labs    04/02/23 0409 04/03/23 0441  NA 132* 131*  K 3.9 3.9  CL 97* 97*  CO2 27 26  GLUCOSE 286* 272*  BUN 23 20  CREATININE 0.88 0.79  CALCIUM 9.1 8.9  MG 1.4* 1.8   Liver Function Tests: No results for input(s): "AST", "ALT", "ALKPHOS", "BILITOT", "PROT", "ALBUMIN" in the last 72 hours.  No results for input(s): "LIPASE", "AMYLASE" in the last 72 hours. CBC: Recent Labs    04/02/23 0409 04/03/23 0441  WBC 7.0 8.8  HGB 12.7 13.2  HCT 38.2 39.8  MCV 90.3 90.2  PLT 235 256   Cardiac Enzymes: No results for input(s): "CKTOTAL", "CKMB", "CKMBINDEX", "TROPONINIHS" in the last 72 hours.  BNP: No results for input(s): "BNP" in the last 72 hours.  D-Dimer: No results for input(s): "DDIMER" in the last 72 hours. Hemoglobin A1C: No results for input(s): "HGBA1C" in the last 72 hours. Fasting Lipid Panel: No results for input(s): "CHOL", "HDL", "LDLCALC", "TRIG", "CHOLHDL", "LDLDIRECT" in the last 72 hours. Thyroid Function Tests: No results for input(s): "TSH", "T4TOTAL", "T3FREE", "THYROIDAB" in the last 72 hours.  Invalid input(s): "FREET3" Anemia Panel: No results for input(s): "VITAMINB12", "FOLATE", "FERRITIN", "TIBC", "IRON", "RETICCTPCT" in the last 72 hours.   Radiology: PERIPHERAL VASCULAR CATHETERIZATION  Result Date: 04/02/2023 See surgical note for result.  CT Angio Chest PE W and/or Wo Contrast  Result Date: 03/28/2023 CLINICAL DATA:  Shortness of breath EXAM: CT ANGIOGRAPHY CHEST WITH CONTRAST TECHNIQUE:  Multidetector CT imaging of the chest was performed using  the standard protocol during bolus administration of intravenous contrast. Multiplanar CT image reconstructions and MIPs were obtained to evaluate the vascular anatomy. RADIATION DOSE REDUCTION: This exam was performed according to the departmental dose-optimization program which includes automated exposure control, adjustment of the mA and/or kV according to patient size and/or use of iterative reconstruction technique. CONTRAST:  75mL OMNIPAQUE IOHEXOL 350 MG/ML SOLN COMPARISON:  CT angiogram 02/25/2023.  X-ray 03/28/2023 and older. FINDINGS: Cardiovascular: Heart is enlarged. Trace pericardial fluid. Coronary artery calcifications are seen. Minimal contrast opacification along the thoracic aorta. Grossly normal course and caliber with vascular calcifications. There is some dilatation of the main pulmonary artery. Please correlate for pulmonary artery hypertension. There is a pulmonary embolism identified in the upper aspect of the middle lobe as seen on series 6, image 213. This has not seen on the previous examination. There is also embolus along the lingula on series 6, image 180, not seen previously. Overall mild clot burden. No larger or more central embolus. Some limited evaluation related to motion. Mediastinum/Nodes: Patulous esophagus. Heterogeneous thyroid gland. There are several prominent nodes identified in the mediastinum and hilum bilaterally, similar to previous. No axillary nodes. Lungs/Pleura: Trace pleural fluid with the adjacent parenchymal opacities. The opacities are increasing from previous at the lower lobes. There are also some dependent areas in the upper lobes. There is some new nodules identified as well in the left upper lobe measuring 10 mm on series 5, image 38. With the time course favor a infectious or inflammatory process as this has not seen previously. There are areas of bronchial wall thickening, mucous plugging and interstitial septal thickening. Few areas of ground-glass as  well. Upper Abdomen: The adrenal glands are preserved in the upper abdomen. Stomach is relatively collapsed punctate nonobstructing right-sided renal stone at the edge of the imaging field. Musculoskeletal: Surgical changes about the left shoulder. Scattered degenerative changes along the spine. Review of the MIP images confirms the above findings. Critical Value/emergent results were called by telephone at the time of interpretation on 03/28/2023 at 4:21 pm EST to provider Dr. Alvester Morin, who verbally acknowledged these results. IMPRESSION: New bilateral few segmental and smaller pulmonary emboli. Mild clot burden. No larger or more central embolus. There is some enlargement of the main pulmonary arteries. Please correlate for pulmonary artery hypertension. This has seen previously. Enlarged heart. Increasing parenchymal bilateral lung opacities identified with some subtle nodularity, ground-glass and interstitial septal thickening. Few presumed reactive nodes. Recommend follow up to confirm resolution. Persistent tiny pleural effusions, left-greater-than-right. Aortic Atherosclerosis (ICD10-I70.0). Electronically Signed   By: Karen Kays M.D.   On: 03/28/2023 16:42   CT HEAD WO CONTRAST ( )  Result Date: 03/28/2023 CLINICAL DATA:  Mental status change, unknown cause. EXAM: CT HEAD WITHOUT CONTRAST TECHNIQUE: Contiguous axial images were obtained from the base of the skull through the vertex without intravenous contrast. RADIATION DOSE REDUCTION: This exam was performed according to the departmental dose-optimization program which includes automated exposure control, adjustment of the mA and/or kV according to patient size and/or use of iterative reconstruction technique. COMPARISON:  None Available. FINDINGS: Brain: There is no evidence of an acute infarct, intracranial hemorrhage, mass, midline shift, or extra-axial fluid collection. Mild cerebral atrophy is within normal limits for age. Hypodensities in the  cerebral white matter nonspecific but compatible with mild chronic small vessel ischemic disease. Vascular: Calcified atherosclerosis at the skull base. No hyperdense vessel. Skull: No acute fracture or suspicious  osseous lesion. Sinuses/Orbits: Chronic left sphenoid sinusitis with near complete opacification. Mild mucosal thickening in the paranasal sinuses elsewhere. Clear mastoid air cells. Bilateral cataract extraction. Other: None. IMPRESSION: 1. No evidence of acute intracranial abnormality. 2. Mild chronic small vessel ischemic disease. Electronically Signed   By: Sebastian Ache M.D.   On: 03/28/2023 15:52   DG Chest Portable 1 View  Result Date: 03/28/2023 CLINICAL DATA:  Shortness of breath EXAM: PORTABLE CHEST - 1 VIEW COMPARISON:  02/25/2023 FINDINGS: Unchanged borderline cardiomegaly. No significant pulmonary vascular congestion. Unchanged mild left basilar opacity favored to be atelectasis. Reversed left total shoulder prosthesis again seen. IMPRESSION: 1. Unchanged mild cardiomegaly. 2. Unchanged left basilar opacity likely due to atelectasis. Electronically Signed   By: Acquanetta Belling M.D.   On: 03/28/2023 11:42    ECHO 01/2023: 1. Left ventricular ejection fraction, by estimation, is 60 to 65%. The left ventricle has normal function. The left ventricle has no regional wall motion abnormalities. There is moderate left ventricular hypertrophy. Indeterminate diastolic filling due  to E-A fusion.   2. Right ventricular systolic function is normal. The right ventricular size is normal.   3. Left atrial size was moderately dilated.   4. Right atrial size was mildly dilated.   5. The mitral valve is normal in structure. Mild mitral valve  regurgitation. No evidence of mitral stenosis.   6. The aortic valve is normal in structure. Aortic valve regurgitation is  not visualized. Mild aortic valve stenosis.   7. The inferior vena cava is normal in size with greater than 50%  respiratory variability,  suggesting right atrial pressure of 3 mmHg.   TELEMETRY reviewed by me 04/03/2023: atrial fibrillation rate 80s  EKG reviewed by me: atrial fibrillation RVR rate 118 bom  Data reviewed by me 04/03/2023: last 24h vitals tele labs imaging I/O hospitalist progress note, ENT notes, vascular surgery notes  Principal Problem:   Acute respiratory failure with hypoxia (HCC) Active Problems:   Chronic kidney disease, stage 3a (HCC)   DM (diabetes mellitus) (HCC)   HTN (hypertension)   Acute hypoxic respiratory failure (HCC)   Atrial fibrillation with RVR (HCC)   GERD (gastroesophageal reflux disease)   Acute heart failure with preserved ejection fraction (HFpEF) (HCC)   Encephalopathy   Epistaxis    ASSESSMENT AND PLAN:  Louva Chachere is a 87 y.o. female  with a past medical history of  mild aortic stenosis, bradycardia, type 2 diabetes, hypercholesterolemia  who presented to the ED on 03/28/2023 for shortness of breath, fatigue. Cardiology was consulted for further evaluation.   # Acute hypoxic respiratory failure # Acute on chronic HFpEF # Acute pulmonary emboli Patient with worsening SOB and fatigue over the last few days, brought to ED yesterday. Initially required BiPAP due to hypoxia now on 8L HFNC. BNP elevated at 661. Troponins negative x2 at 9 > 9. CXR without significant pulmonary edema. CTA chest with few bilateral segmental and small PE, mild clot burden. -Continue IV heparin. Patient with epistaxis on DOAC before and now with heparin, s/p embolization 12/10 with vascular surgery. Consider transition to DOAC. -Continue lasix 20 mg daily.  -Further management of PE per primary.   # Persistent atrial fibrillation # Atrial fibrillation with rapid ventricular response Patient with hx of AF, multiple recent hospitalizations with difficult to control rate. Most recently was placed on metoprolol, diltiazem, and digoxin for rate control. Dig level reported at 2.6 but this was corrected  to 0.2 per lab.  -Start consolidated  dosing of Diltiazem 360 mg once daily this AM. Continue metoprolol 50 mg twice daily, digoxin 0.125 mg every other day.  -Continue heparin, would benefit from DOAC on DC. -Not a candidate for cardioversion as she would need TEE prior but given oxygen requirement this would be higher risk for complication. If she is amenable to discharging home on anticoagulation then could consider DCCV or antiarrhythmic medication in future.  # Hyperlipidemia -Continue rosuvastatin 10 mg daily and aspirin 81 mg daily.    This patient's plan of care was discussed and created with Dr. Juliann Pares and he is in agreement.  Signed: Gale Journey, PA-C  04/03/2023, 8:06 AM [facility] Cardiology

## 2023-04-03 NOTE — Progress Notes (Signed)
OT Cancellation Note  Patient Details Name: Samantha Freeman MRN: [id number] DOB: [birth year]   Cancelled Treatment:    Reason Eval/Treat Not Completed: Medical issues which prohibited therapy. Pt with glucose reading at 416, outside guidelines for OT services. Will attempt to see pt at a later date/time as appropriate.   Ronae Noell L. Tranice Laduke, OTR/L  04/03/23, 3:42 PM

## 2023-04-03 NOTE — Consult Note (Signed)
PHARMACY - ANTICOAGULATION CONSULT NOTE  Pharmacy Consult for Heparin Indication: pulmonary embolus  Allergies  Allergen Reactions   Buspirone     Other Reaction(s): Dizziness   Propofol Anaphylaxis   Zolpidem Other (See Comments)    Hallucinations   Cholestyramine Itching and Rash   Codeine Nausea Only   Hydrochlorothiazide Other (See Comments)    PATIENT DOES NOT REMEMBER   Loratadine Other (See Comments)    PATIENT DOES NOT REMEMBER   Niacin Rash   Patient Measurements: Height: 5\' 4"  (162.6 cm) Weight: 81 kg (178 lb 9.2 oz) IBW/kg (Calculated) : 54.7 Heparin Dosing Weight: 73.7 kg   Vital Signs: Temp: 98.3 F (36.8 C) (12/11 0443) Temp Source: Oral (12/11 0443) BP: 141/86 (12/10 2339) Pulse Rate: 66 (12/11 0443)  Labs: Recent Labs    04/01/23 0604 04/02/23 0409 04/03/23 0441  HGB 13.7 12.7 13.2  HCT 41.3 38.2 39.8  PLT 226 235 256  HEPARINUNFRC 0.47 0.46 0.58  CREATININE 0.91 0.88 0.79   Estimated Creatinine Clearance: 52.9 mL/min (by C-G formula based on SCr of 0.79 mg/dL).  Medical History: Past Medical History:  Diagnosis Date   Anemia    Anxiety    Aortic stenosis, mild    Arthritis    CKD (chronic kidney disease), stage III (HCC)    Complication of anesthesia    Propofol anaphylaxis   Cortical cataract    DDD (degenerative disc disease), lumbar    Depression    Dyspnea    GERD (gastroesophageal reflux disease)    Headache    Heart murmur    History of 2019 novel coronavirus disease (COVID-19) 12/01/2018   HLD (hyperlipidemia)    Hypertension    Pneumonia    Schatzki's ring    Seasonal allergies    T2DM (type 2 diabetes mellitus) (HCC)    Valvular insufficiency    Medications:  Patient was taking apixaban previously but it was held due to epistaxis - most recently filled in October; per patient she stopped taking in October   Assessment: Samantha Freeman is an 87 year old female that presented with difficulty breathing. PMH is  significant for atrial fibrillation not on AC, HFpEF, type 2 diabetes, hypertension, hyperlipidemia, CKD stage IIIa, depression/anxiety presented acute respiratory failure hypoxia, A-fib with RVR, and acute on chronic HFpEF. From chart review, patient was taking apixaban previously but it was held due to epistaxis. CTA of the chest notable for bilateral small to moderate PE. Pharmacy has been consulted for initiation and management of a heparin infusion. Baseline labs: Hgb 14.6, PLT 210, aPTT and PT/INR ordered.   Goal of Therapy:  Heparin level 0.3 - 0.7  Monitor platelets by anticoagulation protocol: Yes  12/6@0202 : HL=0.66, therapeutic X 1@1250  units/hr 12/6@0954 : HL=0.74, supratherapeutic@1250  units/hr 12/6@2018 : HL=0.62, Therapeutic x 1 12/7@0637 : HL=0.58, Therapeutic X 2  12/8@0622 : HL=0.43, therapeutic x3 12/9@0604 : HL=0.47, therapeutic X 4 12/10 0409 HL 0.46, therapeutic x 5 12/11 0441 HL 0.58, therapeutic x 6   Plan:  - continue heparin infusion at 1150 units/hr - recheck HL with AM labs - Monitor CBC and HL daily   Otelia Sergeant, PharmD, [facility] 04/03/2023 6:15 AM

## 2023-04-03 NOTE — Plan of Care (Addendum)

## 2023-04-03 NOTE — Progress Notes (Signed)
Progress Note    04/03/2023 10:58 AM 1 Day Post-Op  Subjective: Samantha Freeman is an 87 yo female who is now POD #1 from thoracic angiogram with selective angiogram of the left external carotid artery, left maxillary artery with coil embolization of the distal left maxillary artery.  Patient is resting comfortably in bed this morning.  Patient endorses she still having to mouth breathe and having difficulty breathing through her nose.  Patient denies any nasal bleeding overnight.  No other complaints.  Vitals all remained stable.   Vitals:   04/03/23 0443 04/03/23 0735  BP:  (!) 135/97  Pulse: 66   Resp: 18   Temp: 98.3 F (36.8 C) 98.8 F (37.1 C)  SpO2: 92%    Physical Exam: Cardiac: Irregular rate and rhythm due to history of A-fib.  Normal S1-S2 no murmurs appreciated. Lungs: Clear on auscultation throughout.  No rales rhonchi or wheezing noted.  Patient only able to mouth breathe at this time.  Nonlabored breathing noted.  Remains on 2 L of humidified nasal cannula oxygen this morning. Incisions: Right groin incision with dressing clean dry and intact.  No hematoma seroma to note. Extremities: All extremities with palpable pulses.  All extremities warm to touch. Abdomen: Positive bowel sounds throughout, soft, nontender and nondistended. Neurologic: Alert and oriented x 4, answers all questions and follows commands appropriately.  CBC    Component Value Date/Time   WBC 8.8 04/03/2023 0441   RBC 4.41 04/03/2023 0441   HGB 13.2 04/03/2023 0441   HGB 11.9 (L) 10/07/2012 1403   HCT 39.8 04/03/2023 0441   HCT 35.1 10/07/2012 1403   PLT 256 04/03/2023 0441   PLT 281 10/07/2012 1403   MCV 90.2 04/03/2023 0441   MCV 87 10/07/2012 1403   MCH 29.9 04/03/2023 0441   MCHC 33.2 04/03/2023 0441   RDW 14.6 04/03/2023 0441   RDW 15.4 (H) 10/07/2012 1403   LYMPHSABS 1.8 03/28/2023 1038   LYMPHSABS 2.4 10/07/2012 1403   MONOABS 1.1 (H) 03/28/2023 1038   MONOABS 0.5 10/07/2012  1403   EOSABS 0.1 03/28/2023 1038   EOSABS 0.2 10/07/2012 1403   BASOSABS 0.1 03/28/2023 1038   BASOSABS 0.0 10/07/2012 1403    BMET    Component Value Date/Time   NA 131 (L) 04/03/2023 0441   NA 140 02/15/2012 0348   K 3.9 04/03/2023 0441   K 3.7 02/15/2012 0348   CL 97 (L) 04/03/2023 0441   CL 105 02/15/2012 0348   CO2 26 04/03/2023 0441   CO2 28 02/15/2012 0348   GLUCOSE 272 (H) 04/03/2023 0441   GLUCOSE 207 (H) 02/15/2012 0348   BUN 20 04/03/2023 0441   BUN 14 02/15/2012 0348   CREATININE 0.79 04/03/2023 0441   CREATININE 0.87 02/15/2012 0348   CALCIUM 8.9 04/03/2023 0441   CALCIUM 8.6 02/15/2012 0348   GFRNONAA >60 04/03/2023 0441   GFRNONAA >60 02/15/2012 0348   GFRAA 55 (L) 09/15/2019 1353   GFRAA >60 02/15/2012 0348    INR    Component Value Date/Time   INR 1.2 03/28/2023 1736   INR 1.0 01/28/2012 0923     Intake/Output Summary (Last 24 hours) at 04/03/2023 1058 Last data filed at 04/03/2023 0747 Gross per 24 hour  Intake 519.19 ml  Output 800 ml  Net -280.81 ml     Assessment/Plan:  87 y.o. female is s/p thoracic angiogram with selective angiogram of the left external carotid artery, left maxillary artery with coil embolization of the distal  left maxillary artery 1 Day Post-Op upon exam this morning patient is noted to have moderate amount of old blood crusting in her nares.  Patient using nasal saline at the bedside to help assist with comfort.  Upon inspection of bilateral nasal layers no bleeding is noted.  PLAN: Successful embolization of the left maxillary artery with coil embolization for uncontrolled epistaxis. Continue patient's current anticoagulation as needed for atrial fibrillation.  Cardiology is following. Patient to follow-up with Dr. Robyn Haber of otolaryngology. Vascular surgery to sign off at this time.  DVT prophylaxis: ASA 81 mg daily and heparin infusion.   [name] [facility] 04/03/2023 10:58  AM

## 2023-04-03 NOTE — Inpatient Diabetes Management (Signed)
Inpatient Diabetes Program Recommendations  AACE/ADA: New Consensus Statement on Inpatient Glycemic Control (2015)  Target Ranges:  Prepandial:   less than 140 mg/dL      Peak postprandial:   less than 180 mg/dL (1-2 hours)      Critically ill patients:  140 - 180 mg/dL   Lab Results  Component Value Date   GLUCAP 252 (H) 04/03/2023   HGBA1C 7.6 (H) 01/31/2023    Review of Glycemic Control  Latest Reference Range & Units 04/01/23 21:02 04/02/23 08:35 04/02/23 13:05 04/02/23 16:46 04/02/23 20:23 04/03/23 09:09  Glucose-Capillary 70 - 99 mg/dL 865 (H) 784 (H) 696 (H) 246 (H) 268 (H) 252 (H)  (H): Data is abnormally high  Diabetes history: DM Outpatient Diabetes medications: Lantus 36 units every day, Humalog 7-19 units TID, Farxiga 10 mg QD Current orders for Inpatient glycemic control: Semglee 30 units at bedtime, Novolog 0-9 units TID and Novolog 6 units TID  Inpatient Diabetes Program Recommendations:    Semglee 36 units every day (home dose).  Will continue to follow while inpatient.  Thank you, Dulce Sellar, MSN, CDCES Diabetes Coordinator Inpatient Diabetes Program [phone] (team pager from 8a-5p)

## 2023-04-03 NOTE — TOC Progression Note (Signed)
Transition of Care [facility]) - Progression Note    Patient Details  Name: Samantha Freeman MRN: [id number] Date of Birth: 12-25-1935  Transition of Care [facility]) CM/SW Contact  Truddie Hidden, RN Phone Number: 04/03/2023, 12:48 PM  Clinical Narrative:    Spoke with patient regarding therapy's recommendation for SNF. Patient does not wish to go to SNF. She stated she would return home with [facility]. She stated she was unsure if her [facility] agency was via [facility]. Patient stated she thinks her HH agency may be [facility].   Message sent to [facility] from [facility] on patient's status.  Patient was previously active with [facility] per Adelina Mings   Patient was advised she is no longer with [facility].  She is requesting to stay with [facility].     Expected Discharge Plan: Home w Home Health Services Barriers to Discharge: Continued Medical Work up  Expected Discharge Plan and Services In-house Referral: Clinical Social Work   Post Acute Care Choice: NA Loreli Slot) Living arrangements for the past 2 months: Single Family Home                   DME Agency: Other - Comment (Patient with [facility]) Date DME Agency Contacted: 03/28/23 Time DME Agency Contacted: 1620 Representative spoke with at DME Agency: Jennette Kettle [phone] HH Arranged: PT, OT HH Agency: NA ([facility])     Representative spoke with at [facility] Agency: Deanna [phone]   Social Determinants of Health (SDOH) Interventions SDOH Screenings   Food Insecurity: No Food Insecurity (03/28/2023)  Housing: Low Risk  (03/28/2023)  Transportation Needs: No Transportation Needs (03/28/2023)  Utilities: Not At Risk (03/28/2023)  Tobacco Use: Low Risk  (03/28/2023)    Readmission Risk Interventions    03/28/2023    4:20 PM  Readmission Risk Prevention Plan  Transportation Screening Complete  PCP or Specialist Appt within 3-5 Days Complete  HRI or Home Care Consult Complete  Social Work Consult for Recovery Care Planning/Counseling Complete  Palliative Care  Screening Not Applicable  Medication Review Oceanographer) Complete

## 2023-04-03 NOTE — Progress Notes (Signed)
PT Cancellation Note  Patient Details Name: Zeffie Kamper MRN: [id number] DOB: [birth year]   Cancelled Treatment:    Reason Eval/Treat Not Completed: Medical issues which prohibited therapy; Pt's most recent BG 416 and trending up falling outside guidelines for participation with PT services.  Will attempt to see pt at a future date/time as medically appropriate.    Ovidio Hanger PT, DPT 04/03/23, 2:53 PM

## 2023-04-04 DIAGNOSIS — J9601 Acute respiratory failure with hypoxia: Secondary | ICD-10-CM | POA: Diagnosis not present

## 2023-04-04 DIAGNOSIS — I482 Chronic atrial fibrillation, unspecified: Secondary | ICD-10-CM | POA: Diagnosis not present

## 2023-04-04 LAB — CBC
HCT: 39.9 % (ref 36.0–46.0)
Hemoglobin: 13.3 g/dL (ref 12.0–15.0)
MCH: 29.4 pg (ref 26.0–34.0)
MCHC: 33.3 g/dL (ref 30.0–36.0)
MCV: 88.3 fL (ref 80.0–100.0)
Platelets: 264 10*3/uL (ref 150–400)
RBC: 4.52 MIL/uL (ref 3.87–5.11)
RDW: 14.4 % (ref 11.5–15.5)
WBC: 8.3 10*3/uL (ref 4.0–10.5)
nRBC: 0 % (ref 0.0–0.2)

## 2023-04-04 LAB — GLUCOSE, CAPILLARY
Glucose-Capillary: 136 mg/dL — ABNORMAL HIGH (ref 70–99)
Glucose-Capillary: 231 mg/dL — ABNORMAL HIGH (ref 70–99)
Glucose-Capillary: 226 mg/dL — ABNORMAL HIGH (ref 70–99)
Glucose-Capillary: 408 mg/dL — ABNORMAL HIGH (ref 70–99)

## 2023-04-04 LAB — GLUCOSE, RANDOM: Glucose, Bld: 391 mg/dL — ABNORMAL HIGH (ref 70–99)

## 2023-04-04 LAB — BASIC METABOLIC PANEL
Anion gap: 9 (ref 5–15)
BUN: 16 mg/dL (ref 8–23)
CO2: 29 mmol/L (ref 22–32)
Calcium: 9.2 mg/dL (ref 8.9–10.3)
Chloride: 98 mmol/L (ref 98–111)
Creatinine, Ser: 0.73 mg/dL (ref 0.44–1.00)
GFR, Estimated: >60 mL/min (ref >60)
Glucose, Bld: 162 mg/dL — ABNORMAL HIGH (ref 70–99)
Potassium: 3.8 mmol/L (ref 3.5–5.1)
Sodium: 136 mmol/L (ref 135–145)

## 2023-04-04 LAB — HEPARIN LEVEL (UNFRACTIONATED): Heparin Unfractionated: 0.54 [IU]/mL (ref 0.30–0.70)

## 2023-04-04 MED ORDER — DOCUSATE SODIUM 100 MG PO CAPS
200.0000 mg | ORAL_CAPSULE | Freq: Two times a day (BID) | ORAL | Status: DC
  Administered 2023-04-05 – 2023-04-06 (×2): 200 mg via ORAL
  Filled 2023-04-04 (×8): qty 2

## 2023-04-04 MED ORDER — APIXABAN 5 MG PO TABS: 5.0000 mg | ORAL_TABLET | Freq: Two times a day (BID) | ORAL | Status: DC

## 2023-04-04 MED ORDER — APIXABAN 5 MG PO TABS
10.0000 mg | ORAL_TABLET | Freq: Two times a day (BID) | ORAL | Status: DC
  Administered 2023-04-04 – 2023-04-08 (×9): 10 mg via ORAL
  Filled 2023-04-04 (×10): qty 2

## 2023-04-04 MED ORDER — INSULIN ASPART 100 UNIT/ML IJ SOLN
15.0000 [IU] | Freq: Once | INTRAMUSCULAR | Status: AC
  Administered 2023-04-05: 15 [IU] via SUBCUTANEOUS
  Filled 2023-04-04: qty 1

## 2023-04-04 NOTE — Progress Notes (Signed)
Physical Therapy Treatment Patient Details Name: Samantha Freeman MRN: [id number] DOB: 1935/11/13 Today's Date: 04/04/2023   History of Present Illness Pt is a 87 y.o. female admitted with AFIB with RVR, acute hypoxic respiratory failure, acute heart failure, & encephalopathy. PMH significant for atrial fibrillation not on AC, HFpEF, type 2 diabetes, hypertension, hyperlipidemia, CKD stage IIIa    PT Comments  Patient is agreeable to PT session. Sp02 95% on 2 L02. Sp02 91% on rest on room air and down to 88% on room air with ambulation. Patient is able to ambulate 31ft with rolling walker with CGA. Mild dyspnea with exertion. Cues for breathing techniques. Recommend to continue PT to maximize independence and facilitate return to prior level of function.    If plan is discharge home, recommend the following: A little help with walking and/or transfers;A little help with bathing/dressing/bathroom;Assistance with cooking/housework;Assist for transportation   Can travel by private vehicle     No  Equipment Recommendations  None recommended by PT    Recommendations for Other Services       Precautions / Restrictions Precautions Precautions: Fall Restrictions Weight Bearing Restrictions Per Provider Order: No     Mobility  Bed Mobility               General bed mobility comments: not assessed as patient sitting up on arrival and post session    Transfers Overall transfer level: Needs assistance Equipment used: Rolling walker (2 wheels) Transfers: Sit to/from Stand Sit to Stand: Contact guard assist           General transfer comment: CGA for safety    Ambulation/Gait Ambulation/Gait assistance: Contact guard assist Gait Distance (Feet): 20 Feet Assistive device: Rolling walker (2 wheels) Gait Pattern/deviations: Step-through pattern, Decreased stride length Gait velocity: decreased     General Gait Details: short distance ambulation in room with no loss of  balance using rolling walker for support. mild dyspnea with exertion with Sp02 down to 88% on room air. cues for breathing techniques. further ambulation distance limited by fatigue.   Stairs             Wheelchair Mobility     Tilt Bed    Modified Rankin (Stroke Patients Only)       Balance Overall balance assessment: Needs assistance Sitting-balance support: Feet supported Sitting balance-Leahy Scale: Good     Standing balance support: During functional activity, Bilateral upper extremity supported Standing balance-Leahy Scale: Fair                              Cognition Arousal: Alert Behavior During Therapy: WFL for tasks assessed/performed Overall Cognitive Status: Within Functional Limits for tasks assessed                                          Exercises      General Comments General comments (skin integrity, edema, etc.): Sp02 95% on 2 L02. Nurse requested to remove oxygen during mobility and monitor Sp02. On room air at rest, Sp02 92%. With walking, Sp02 down to 88% on room air and increased back to 90% with short seated rest break. Replaced the 02 at end of session with Sp02 95%      Pertinent Vitals/Pain Pain Assessment Pain Assessment: Faces Faces Pain Scale: Hurts a little bit Pain Location: L ankle Pain Descriptors /  Indicators: Discomfort Pain Intervention(s): Limited activity within patient's tolerance, Monitored during session, Repositioned    Home Living                          Prior Function            PT Goals (current goals can now be found in the care plan section) Acute Rehab PT Goals Patient Stated Goal: to get out of the hospital PT Goal Formulation: With patient Time For Goal Achievement: 04/13/23 Potential to Achieve Goals: Good Progress towards PT goals: Progressing toward goals    Frequency    Min 1X/week      PT Plan      Co-evaluation              AM-PAC PT  "6 Clicks" Mobility   Outcome Measure  Help needed turning from your back to your side while in a flat bed without using bedrails?: A Little Help needed moving from lying on your back to sitting on the side of a flat bed without using bedrails?: A Little Help needed moving to and from a bed to a chair (including a wheelchair)?: A Little Help needed standing up from a chair using your arms (e.g., wheelchair or bedside chair)?: A Little Help needed to walk in hospital room?: A Little Help needed climbing 3-5 steps with a railing? : A Little 6 Click Score: 18    End of Session Equipment Utilized During Treatment: Oxygen Activity Tolerance: Patient tolerated treatment well Patient left: in chair;with call bell/phone within reach Nurse Communication: Mobility status PT Visit Diagnosis: Muscle weakness (generalized) (M62.81);Difficulty in walking, not elsewhere classified (R26.2)     Time: 0981-1914 PT Time Calculation (min) (ACUTE ONLY): 18 min  Charges:    $Therapeutic Activity: 8-22 mins PT General Charges $$ ACUTE PT VISIT: 1 Visit                    Donna Bernard, PT, MPT    Samantha Freeman 04/04/2023, 3:14 PM

## 2023-04-04 NOTE — Progress Notes (Signed)
OT Cancellation Note  Patient Details Name: Samantha Freeman MRN: [id number] DOB: May 30, 1935   Cancelled Treatment:    Reason Eval/Treat Not Completed: Other (comment). Pt working with PT. Will re-attempt OT tx at later date/time as pt is available.   Arman Filter., MPH, MS, OTR/L ascom [phone] 04/04/23, 2:42 PM

## 2023-04-04 NOTE — Progress Notes (Signed)
PROGRESS NOTE    Samantha Freeman  XBJ:[id number] DOB: November 10, 1981 DOA: 03/28/2023 PCP: Marguarite Arbour, MD    Assessment & Plan:   Principal Problem:   Acute respiratory failure with hypoxia (HCC) Active Problems:   Atrial fibrillation with RVR (HCC)   Acute hypoxic respiratory failure (HCC)   Acute heart failure with preserved ejection fraction (HFpEF) (HCC)   Chronic kidney disease, stage 3a (HCC)   DM (diabetes mellitus) (HCC)   HTN (hypertension)   GERD (gastroesophageal reflux disease)   Encephalopathy   Epistaxis  Assessment and Plan: Persistent a. fib: continue on metoprolol, diltiazem, digoxin. D/c IV heparin and start eliquis. Not a candidate for cardioversion as she would need TEE prior but given oxygen requirement pt would be at higher risk as per cardio. Cardio following and recs apprec   Recurrent epistaxis: s/p left maxillary artery embolization. Likely secondary to IV heparin. Hx of nose bleeds. Continue w/ humidified oxygen & nasal spray. ENT recs apprec    Acute hypoxic respiratory failure: likely secondary to acute PE & acute on chronic diastolic CHF. Continue on supplemental oxygen and wean as tolerated   Acute PE: d/c IV heparin and start eliquis    Acute on chronic diastolic CHF: echo w/ EF 60-65%. 1=2+ pitting edema. S/p IV lasix. Continue on lasix as per cardio. Monitor I/Os.    Encephalopathy: w/ transient episode of a hallucination. CT head shows no acute intracranial abnormalities. Resolved    GERD: continue on PPI    HTN:  continue on CCB, BB    DM2: fair control, HbA1c 7.6. Continue on glargine, SSI w/ accuchecks    CKDIIIa:  Cr is better than baseline currently        DVT prophylaxis: eliquis  Code Status: DNR  Family Communication: discussed pt's care w/ pt's son at bedside and answered his questions  Disposition Plan: SNF  Level of care: Progressive  Status is: Inpatient Remains inpatient appropriate because: severity of  illness    Consultants:  Cardio   Procedures:  Antimicrobials:   Subjective: Pt c/o 1 nostril being stuffy   Objective: Vitals:   04/03/23 1604 04/03/23 2053 04/03/23 2347 04/04/23 0336  BP:  120/87 126/68 123/86  Pulse:  64 93   Resp:  19 16 15   Temp: 98.3 F (36.8 C) 98.3 F (36.8 C) 97.8 F (36.6 C) 97.9 F (36.6 C)  TempSrc: Oral   Oral  SpO2:  97% 96% 93%  Weight:      Height:        Intake/Output Summary (Last 24 hours) at 04/04/2023 0845 Last data filed at 04/04/2023 3086 Gross per 24 hour  Intake 463.5 ml  Output 2050 ml  Net -1586.5 ml   Filed Weights   03/28/23 1029 03/28/23 2145  Weight: 86.1 kg 81 kg    Examination:  General exam: Appears comfortable  Respiratory system: diminished breath sounds b/l  Cardiovascular system: irregularly irregular  Gastrointestinal system: Abd is soft, NT, obese, hypoactive bowel sounds  Central nervous system: alert & oriented. Moves all extremities  Psychiatry: judgement and insight appears normal. Appropriate mood and affect    Data Reviewed: I have personally reviewed following labs and imaging studies  CBC: Recent Labs  Lab 03/28/23 1038 03/29/23 0202 03/31/23 0622 04/01/23 0604 04/02/23 0409 04/03/23 0441 04/04/23 0517  WBC 11.3*   < > 8.3 6.5 7.0 8.8 8.3  NEUTROABS 8.2*  --   --   --   --   --   --  HGB 14.6   < > 14.4 13.7 12.7 13.2 13.3  HCT 44.9   < > 43.2 41.3 38.2 39.8 39.9  MCV 91.4   < > 89.1 88.2 90.3 90.2 88.3  PLT 210   < > 234 226 235 256 264   < > = values in this interval not displayed.   Basic Metabolic Panel: Recent Labs  Lab 03/30/23 0637 03/31/23 0622 04/01/23 0604 04/02/23 0409 04/03/23 0441 04/04/23 0517  NA 134* 132* 132* 132* 131* 136  K 3.7 3.8 4.0 3.9 3.9 3.8  CL 96* 94* 98 97* 97* 98  CO2 28 26 24 27 26 29   GLUCOSE 204* 263* 296* 286* 272* 162*  BUN 24* 26* 25* 23 20 16   CREATININE 1.12* 0.98 0.91 0.88 0.79 0.73  CALCIUM 9.0 9.3 9.1 9.1 8.9 9.2  MG 1.8  1.8 1.6* 1.4* 1.8  --    GFR: Estimated Creatinine Clearance: 52.9 mL/min (by C-G formula based on SCr of 0.73 mg/dL). Liver Function Tests: Recent Labs  Lab 03/29/23 0202  AST 16  ALT 13  ALKPHOS 46  BILITOT 1.2*  PROT 6.7  ALBUMIN 3.5   No results for input(s): "LIPASE", "AMYLASE" in the last 168 hours. No results for input(s): "AMMONIA" in the last 168 hours. Coagulation Profile: Recent Labs  Lab 03/28/23 1736  INR 1.2   Cardiac Enzymes: No results for input(s): "CKTOTAL", "CKMB", "CKMBINDEX", "TROPONINI" in the last 168 hours. BNP (last 3 results) No results for input(s): "PROBNP" in the last 8760 hours. HbA1C: No results for input(s): "HGBA1C" in the last 72 hours. CBG: Recent Labs  Lab 04/03/23 1136 04/03/23 1442 04/03/23 1742 04/03/23 2052 04/04/23 0749  GLUCAP 387* 416* 288* 230* 136*   Lipid Profile: No results for input(s): "CHOL", "HDL", "LDLCALC", "TRIG", "CHOLHDL", "LDLDIRECT" in the last 72 hours. Thyroid Function Tests: No results for input(s): "TSH", "T4TOTAL", "FREET4", "T3FREE", "THYROIDAB" in the last 72 hours. Anemia Panel: No results for input(s): "VITAMINB12", "FOLATE", "FERRITIN", "TIBC", "IRON", "RETICCTPCT" in the last 72 hours. Sepsis Labs: Recent Labs  Lab 03/28/23 2241 03/29/23 0202  PROCALCITON <0.10  --   LATICACIDVEN 1.2 1.0    Recent Results (from the past 240 hours)  SARS Coronavirus 2 by RT PCR (hospital order, performed in [facility] hospital lab) *cepheid single result test* Anterior Nasal Swab     Status: None   Collection Time: 03/28/23 10:38 AM   Specimen: Anterior Nasal Swab  Result Value Ref Range Status   SARS Coronavirus 2 by RT PCR NEGATIVE NEGATIVE Final    Comment: (NOTE) SARS-CoV-2 target nucleic acids are NOT DETECTED.  The SARS-CoV-2 RNA is generally detectable in upper and lower respiratory specimens during the acute phase of infection. The lowest concentration of SARS-CoV-2 viral copies this assay can  detect is 250 copies / mL. A negative result does not preclude SARS-CoV-2 infection and should not be used as the sole basis for treatment or other patient management decisions.  A negative result may occur with improper specimen collection / handling, submission of specimen other than nasopharyngeal swab, presence of viral mutation(s) within the areas targeted by this assay, and inadequate number of viral copies (<250 copies / mL). A negative result must be combined with clinical observations, patient history, and epidemiological information.  Fact Sheet for Patients:   RoadLapTop.co.za  Fact Sheet for Healthcare Providers: http://kim-miller.com/  This test is not yet approved or  cleared by the [place] FDA and has been authorized for detection and/or  diagnosis of SARS-CoV-2 by FDA under an Emergency Use Authorization (EUA).  This EUA will remain in effect (meaning this test can be used) for the duration of the COVID-19 declaration under Section 564(b)(1) of the Act, 21 U.S.C. section 360bbb-3(b)(1), unless the authorization is terminated or revoked sooner.  Performed at [facility], [address] [place], [place] [address]   MRSA Next Gen by PCR, Nasal     Status: None   Collection Time: 03/28/23  9:59 PM   Specimen: Nasal Mucosa; Nasal Swab  Result Value Ref Range Status   MRSA by PCR Next Gen NOT DETECTED NOT DETECTED Final    Comment: (NOTE) The GeneXpert MRSA Assay (FDA approved for NASAL specimens only), is one component of a comprehensive MRSA colonization surveillance program. It is not intended to diagnose MRSA infection nor to guide or monitor treatment for MRSA infections. Test performance is not FDA approved in patients less than 45 years old. Performed at [facility], [address], [place], [place] [address]          Radiology Studies: PERIPHERAL VASCULAR CATHETERIZATION Result  Date: 04/02/2023 See surgical note for result.       Scheduled Meds:  aspirin EC  81 mg Oral Daily   digoxin  0.125 mg Oral QODAY   diltiazem  360 mg Oral Daily   feeding supplement (GLUCERNA SHAKE)  237 mL Oral TID BM   furosemide  20 mg Oral Daily   gabapentin  200 mg Oral TID   insulin aspart  0-15 Units Subcutaneous TID WC   insulin aspart  6 Units Subcutaneous TID WC   insulin glargine-yfgn  30 Units Subcutaneous QHS   metoprolol tartrate  50 mg Oral BID   mupirocin ointment   Nasal BID   pantoprazole  40 mg Oral Daily   PARoxetine  10 mg Oral Daily   prednisoLONE acetate  1 drop Both Eyes QHS   rOPINIRole  1 mg Oral QHS   rosuvastatin  10 mg Oral Daily   sodium chloride  2 spray Each Nare QID   Continuous Infusions:  heparin 1,150 Units/hr (04/04/23 0442)     LOS: 7 days       Charise Killian, MD [facility] Pager [phone]  If 7PM-7AM, please contact night-coverage [email or web address] 04/04/2023, 8:45 AM

## 2023-04-04 NOTE — Consult Note (Signed)
PHARMACY - ANTICOAGULATION CONSULT NOTE  Pharmacy Consult for Transition Heparin infusion to Eliquis Indication: pulmonary embolus  Allergies  Allergen Reactions   Buspirone     Other Reaction(s): Dizziness   Propofol Anaphylaxis   Zolpidem Other (See Comments)    Hallucinations   Cholestyramine Itching and Rash   Codeine Nausea Only   Hydrochlorothiazide Other (See Comments)    PATIENT DOES NOT REMEMBER   Loratadine Other (See Comments)    PATIENT DOES NOT REMEMBER   Niacin Rash    Patient Measurements: Height: 5\' 4"  (162.6 cm) Weight: 81 kg (178 lb 9.2 oz) IBW/kg (Calculated) : 54.7   Vital Signs: Temp: 97.9 F (36.6 C) (12/12 0336) Temp Source: Oral (12/12 0336) BP: 123/86 (12/12 0336)  Labs: Recent Labs    04/02/23 0409 04/03/23 0441 04/04/23 0517  HGB 12.7 13.2 13.3  HCT 38.2 39.8 39.9  PLT 235 256 264  HEPARINUNFRC 0.46 0.58 0.54  CREATININE 0.88 0.79 0.73    Estimated Creatinine Clearance: 52.9 mL/min (by C-G formula based on SCr of 0.73 mg/dL).   Medical History: Past Medical History:  Diagnosis Date   Anemia    Anxiety    Aortic stenosis, mild    Arthritis    CKD (chronic kidney disease), stage III (HCC)    Complication of anesthesia    Propofol anaphylaxis   Cortical cataract    DDD (degenerative disc disease), lumbar    Depression    Dyspnea    GERD (gastroesophageal reflux disease)    Headache    Heart murmur    History of 2019 novel coronavirus disease (COVID-19) 12/01/2018   HLD (hyperlipidemia)    Hypertension    Pneumonia    Schatzki's ring    Seasonal allergies    T2DM (type 2 diabetes mellitus) (HCC)    Valvular insufficiency     Medications:  Patient was taking apixaban previously but it was held due to epistaxis - most recently filled in October; per patient she stopped taking in October    Assessment: Samantha Freeman is an 87 year old female that presented with difficulty breathing. PMH is significant for atrial  fibrillation not on AC, HFpEF, type 2 diabetes, hypertension, hyperlipidemia, CKD stage IIIa, depression/anxiety presented acute respiratory failure hypoxia, A-fib with RVR, and acute on chronic HFpEF. From chart review, patient was taking apixaban previously but it was held due to epistaxis. CTA of the chest notable for bilateral small to moderate PE.    Plan:  Stop heparin infusion Start Apixaban 10mg  po BID x 7 days, then apixaban 5mg  po BID   Imaad Reuss Rodriguez-Guzman PharmD, BCPS 04/04/2023 11:55 AM

## 2023-04-04 NOTE — Care Management Important Message (Signed)
Important Message  Patient Details  Name: Samantha Freeman MRN: [id number] Date of Birth: [birth year]   Important Message Given:  Yes - Medicare IM     [name] A Veleta Yamamoto 04/04/2023, 12:26 PM

## 2023-04-04 NOTE — Consult Note (Signed)
PHARMACY - ANTICOAGULATION CONSULT NOTE  Pharmacy Consult for Heparin Indication: pulmonary embolus  Allergies  Allergen Reactions   Buspirone     Other Reaction(s): Dizziness   Propofol Anaphylaxis   Zolpidem Other (See Comments)    Hallucinations   Cholestyramine Itching and Rash   Codeine Nausea Only   Hydrochlorothiazide Other (See Comments)    PATIENT DOES NOT REMEMBER   Loratadine Other (See Comments)    PATIENT DOES NOT REMEMBER   Niacin Rash   Patient Measurements: Height: 5\' 4"  (162.6 cm) Weight: 81 kg (178 lb 9.2 oz) IBW/kg (Calculated) : 54.7 Heparin Dosing Weight: 73.7 kg   Vital Signs: Temp: 97.9 F (36.6 C) (12/12 0336) Temp Source: Oral (12/12 0336) BP: 123/86 (12/12 0336) Pulse Rate: 93 (12/11 2347)  Labs: Recent Labs    04/02/23 0409 04/03/23 0441 04/04/23 0517  HGB 12.7 13.2 13.3  HCT 38.2 39.8 39.9  PLT 235 256 264  HEPARINUNFRC 0.46 0.58 0.54  CREATININE 0.88 0.79 0.73   Estimated Creatinine Clearance: 52.9 mL/min (by C-G formula based on SCr of 0.73 mg/dL).  Medical History: Past Medical History:  Diagnosis Date   Anemia    Anxiety    Aortic stenosis, mild    Arthritis    CKD (chronic kidney disease), stage III (HCC)    Complication of anesthesia    Propofol anaphylaxis   Cortical cataract    DDD (degenerative disc disease), lumbar    Depression    Dyspnea    GERD (gastroesophageal reflux disease)    Headache    Heart murmur    History of 2019 novel coronavirus disease (COVID-19) 12/01/2018   HLD (hyperlipidemia)    Hypertension    Pneumonia    Schatzki's ring    Seasonal allergies    T2DM (type 2 diabetes mellitus) (HCC)    Valvular insufficiency    Medications:  Patient was taking apixaban previously but it was held due to epistaxis - most recently filled in October; per patient she stopped taking in October   Assessment: Samantha Freeman is an 87 year old female that presented with difficulty breathing. PMH is  significant for atrial fibrillation not on AC, HFpEF, type 2 diabetes, hypertension, hyperlipidemia, CKD stage IIIa, depression/anxiety presented acute respiratory failure hypoxia, A-fib with RVR, and acute on chronic HFpEF. From chart review, patient was taking apixaban previously but it was held due to epistaxis. CTA of the chest notable for bilateral small to moderate PE. Pharmacy has been consulted for initiation and management of a heparin infusion. Baseline labs: Hgb 14.6, PLT 210, aPTT and PT/INR ordered.   Goal of Therapy:  Heparin level 0.3 - 0.7  Monitor platelets by anticoagulation protocol: Yes  12/6@0202 : HL=0.66, therapeutic X 1@1250  units/hr 12/6@0954 : HL=0.74, supratherapeutic@1250  units/hr 12/6@2018 : HL=0.62, Therapeutic x 1 12/7@0637 : HL=0.58, Therapeutic X 2  12/8@0622 : HL=0.43, therapeutic x3 12/9@0604 : HL=0.47, therapeutic X 4 12/10 0409 HL 0.46, therapeutic x 5 12/11 0441 HL 0.58, therapeutic x 6 12/12 0517 HL 0.54, therapeutic x 7   Plan:  - continue heparin infusion at 1150 units/hr - recheck HL with AM labs - Monitor CBC and HL daily   Otelia Sergeant, PharmD, [facility] 04/04/2023 6:58 AM

## 2023-04-04 NOTE — Progress Notes (Signed)
[facility] CARDIOLOGY PROGRESS NOTE       Patient ID: Samantha Freeman MRN: [id number] DOB/AGE: 10-22-1935 87 y.o.  Admit date: 03/28/2023 Referring Physician Dr. Doree Albee Primary Physician Sparks, Duane Lope, MD  Primary Cardiologist Dr. Darrold Junker Reason for Consultation AoCHF, AF RVR  HPI: Samantha Freeman is a 87 y.o. female  with a past medical history of  mild aortic stenosis, bradycardia, type 2 diabetes, hypercholesterolemia  who presented to the ED on 03/28/2023 for shortness of breath, fatigue. Cardiology was consulted for further evaluation.   Interval history: -Patient seen and examined this AM, she reports she is feeling significantly better today.  -Feels her SOB is much better today, nose is clearer. Reports small amount of oozing from L nostril overnight. -Remains in AF with controlled rate. Denies CP, palpitations.   Review of systems complete and found to be negative unless listed above    Past Medical History:  Diagnosis Date   Anemia    Anxiety    Aortic stenosis, mild    Arthritis    CKD (chronic kidney disease), stage III (HCC)    Complication of anesthesia    Propofol anaphylaxis   Cortical cataract    DDD (degenerative disc disease), lumbar    Depression    Dyspnea    GERD (gastroesophageal reflux disease)    Headache    Heart murmur    History of 2019 novel coronavirus disease (COVID-19) 12/01/2018   HLD (hyperlipidemia)    Hypertension    Pneumonia    Schatzki's ring    Seasonal allergies    T2DM (type 2 diabetes mellitus) (HCC)    Valvular insufficiency     Past Surgical History:  Procedure Laterality Date   ABDOMINAL HYSTERECTOMY     APPENDECTOMY     BICEPT TENODESIS Right 05/13/2018   Procedure: BICEPS TENODESIS;  Surgeon: Christena Flake, MD;  Location: [facility] ORS;  Service: Orthopedics;  Laterality: Right;   CARDIAC CATHETERIZATION     COLONOSCOPY     EMBOLIZATION (CATH LAB) Bilateral 04/02/2023   Procedure: EMBOLIZATION;   Surgeon: Renford Dills, MD;  Location: [facility] INVASIVE CV LAB;  Service: Cardiovascular;  Laterality: Bilateral;   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   EYE SURGERY Right 09/2013   Corneal transplant   JOINT REPLACEMENT Right 2015   knee   REVERSE SHOULDER ARTHROPLASTY Left 10/13/2020   Procedure: REVERSE SHOULDER ARTHROPLASTY;  Surgeon: Christena Flake, MD;  Location: [facility] ORS;  Service: Orthopedics;  Laterality: Left;   SHOULDER ARTHROSCOPY WITH ROTATOR CUFF REPAIR Right 05/13/2018   Procedure: SHOULDER ARTHROSCOPY WITH DEBRIDEMENT, DECOMPRESSION AND ROTATOR CUFF REPAIR;  Surgeon: Christena Flake, MD;  Location: [facility] ORS;  Service: Orthopedics;  Laterality: Right;   TONSILLECTOMY      Medications Prior to Admission  Medication Sig Dispense Refill Last Dose/Taking   ALPRAZolam (XANAX) 0.5 MG tablet Take 0.5 mg by mouth at bedtime as needed for anxiety or sleep.   03/27/2023   aspirin EC 81 MG tablet Take 1 tablet (81 mg total) by mouth daily. Swallow whole. 30 tablet 0 03/27/2023   Calcium Carb-Cholecalciferol (CALCIUM 600/VITAMIN D3 PO) Take 1 tablet by mouth daily.   03/27/2023   Coenzyme Q10 10 MG capsule Take 10 mg by mouth every morning.   03/27/2023   dapagliflozin propanediol (FARXIGA) 10 MG TABS tablet Take 10 mg by mouth daily.   03/27/2023   digoxin (LANOXIN) 0.125 MG tablet Take 1 tablet (0.125 mg total) by mouth every  other day. 15 tablet 0 03/27/2023   diltiazem (CARDIZEM CD) 180 MG 24 hr capsule Take 1 capsule (180 mg total) by mouth daily. 30 capsule 0 03/27/2023   fenofibrate (TRICOR) 48 MG tablet Take 1 tablet by mouth daily.   03/27/2023   furosemide (LASIX) 20 MG tablet Take 20 mg by mouth daily as needed for fluid or edema.   prn at unknown   gabapentin (NEURONTIN) 100 MG capsule Take 200 mg by mouth 3 (three) times daily.   03/27/2023   hydrOXYzine (ATARAX) 25 MG tablet Take 1 tablet by mouth 2 (two) times daily.   03/27/2023   insulin lispro (HUMALOG) 100 UNIT/ML  injection Inject 7-19 Units into the skin 3 (three) times daily before meals. Blood Glucose level: 140-199 - 7 units, 200-250 - 9 units, 251-299 - 13 units,  300-349 - 17 units,  350 or above 19 units.   03/27/2023   LANTUS SOLOSTAR 100 UNIT/ML Solostar Pen Inject 36 Units into the skin at bedtime.   03/27/2023   magnesium oxide (MAG-OX) 400 MG tablet Take 800 mg by mouth 3 (three) times daily.   03/27/2023   metoprolol tartrate (LOPRESSOR) 50 MG tablet Take 1 tablet (50 mg total) by mouth 2 (two) times daily. 60 tablet 0 03/27/2023   Multiple Vitamin (MULTIVITAMIN) tablet Take 1 tablet by mouth daily. Women's Daily Multivitamin   03/27/2023   pantoprazole (PROTONIX) 40 MG tablet Take 1 tablet (40 mg total) by mouth daily.   03/27/2023   PARoxetine (PAXIL) 10 MG tablet Take 10 mg by mouth daily.   03/27/2023   prednisoLONE acetate (PRED FORTE) 1 % ophthalmic suspension Place 1 drop into both eyes at bedtime.   03/27/2023   rOPINIRole (REQUIP) 1 MG tablet Take 1 mg by mouth at bedtime.   03/27/2023   rosuvastatin (CRESTOR) 10 MG tablet Take 10 mg by mouth daily.   03/27/2023   vitamin B-12 (CYANOCOBALAMIN) 1000 MCG tablet Take 1,000 mcg by mouth daily.   03/27/2023   blood glucose meter kit and supplies KIT Dispense based on patient and insurance preference. Use up to four times daily as directed. (FOR ICD-9 250.00, 250.01). For QAC - HS accuchecks. 1 each 1    ferrous sulfate 325 (65 FE) MG tablet Take 325 mg by mouth daily with breakfast. (Patient not taking: Reported on 02/25/2023)      glucose blood (FREESTYLE LITE) test strip For glucose testing every before meals at bedtime. Diagnosis E 11.65  Can substitute to any accepted brand 100 each 0    Insulin Syringe-Needle U-100 25G X 1" 1 ML MISC For 4 times a day insulin SQ, 1 month supply. Diagnosis E11.65 30 each 0    traZODone (DESYREL) 50 MG tablet Take 50 mg by mouth at bedtime. (Patient not taking: Reported on 03/28/2023)   Not Taking   Social History    Socioeconomic History   Marital status: Widowed    Spouse name: Not on file   Number of children: Not on file   Years of education: Not on file   Highest education level: Not on file  Occupational History   Not on file  Tobacco Use   Smoking status: Never   Smokeless tobacco: Never  Vaping Use   Vaping status: Never Used  Substance and Sexual Activity   Alcohol use: Never   Drug use: Never   Sexual activity: Not Currently  Other Topics Concern   Not on file  Social History Narrative  Lives alone, has support from son who is a Optician, dispensing and daughter in Social worker. Will be with mother when she has surgery.   Social Drivers of Corporate investment banker Strain: Not on file  Food Insecurity: No Food Insecurity (03/28/2023)   Hunger Vital Sign    Worried About Running Out of Food in the Last Year: Never true    Ran Out of Food in the Last Year: Never true  Transportation Needs: No Transportation Needs (03/28/2023)   PRAPARE - Administrator, Civil Service (Medical): No    Lack of Transportation (Non-Medical): No  Physical Activity: Not on file  Stress: Not on file  Social Connections: Not on file  Intimate Partner Violence: Not At Risk (03/28/2023)   Humiliation, Afraid, Rape, and Kick questionnaire    Fear of Current or Ex-Partner: No    Emotionally Abused: No    Physically Abused: No    Sexually Abused: No    Family History  Problem Relation Age of Onset   Breast cancer Paternal Aunt      Vitals:   04/03/23 1604 04/03/23 2053 04/03/23 2347 04/04/23 0336  BP:  120/87 126/68 123/86  Pulse:  64 93   Resp:  19 16 15   Temp: 98.3 F (36.8 C) 98.3 F (36.8 C) 97.8 F (36.6 C) 97.9 F (36.6 C)  TempSrc: Oral   Oral  SpO2:  97% 96% 93%  Weight:      Height:        PHYSICAL EXAM General: Chronically ill appearing elderly female, well nourished, in no acute distress. HEENT: Normocephalic and atraumatic. Neck: No JVD.  Lungs: Normal respiratory effort on 5L  HFNC. Clear bilaterally to auscultation. No wheezes, crackles, rhonchi.  Heart: Irregularly irregular, controlled rate. Normal S1 and S2 without gallops or murmurs.  Abdomen: Non-distended appearing.  Msk: Normal strength and tone for age. Extremities: Warm and well perfused. No clubbing, cyanosis. No edema.  Neuro: Alert and oriented X 3. Psych: Answers questions appropriately.   Labs: Basic Metabolic Panel: Recent Labs    04/02/23 0409 04/03/23 0441 04/04/23 0517  NA 132* 131* 136  K 3.9 3.9 3.8  CL 97* 97* 98  CO2 27 26 29   GLUCOSE 286* 272* 162*  BUN 23 20 16   CREATININE 0.88 0.79 0.73  CALCIUM 9.1 8.9 9.2  MG 1.4* 1.8  --    Liver Function Tests: No results for input(s): "AST", "ALT", "ALKPHOS", "BILITOT", "PROT", "ALBUMIN" in the last 72 hours.  No results for input(s): "LIPASE", "AMYLASE" in the last 72 hours. CBC: Recent Labs    04/03/23 0441 04/04/23 0517  WBC 8.8 8.3  HGB 13.2 13.3  HCT 39.8 39.9  MCV 90.2 88.3  PLT 256 264   Cardiac Enzymes: No results for input(s): "CKTOTAL", "CKMB", "CKMBINDEX", "TROPONINIHS" in the last 72 hours.  BNP: No results for input(s): "BNP" in the last 72 hours.  D-Dimer: No results for input(s): "DDIMER" in the last 72 hours. Hemoglobin A1C: No results for input(s): "HGBA1C" in the last 72 hours. Fasting Lipid Panel: No results for input(s): "CHOL", "HDL", "LDLCALC", "TRIG", "CHOLHDL", "LDLDIRECT" in the last 72 hours. Thyroid Function Tests: No results for input(s): "TSH", "T4TOTAL", "T3FREE", "THYROIDAB" in the last 72 hours.  Invalid input(s): "FREET3" Anemia Panel: No results for input(s): "VITAMINB12", "FOLATE", "FERRITIN", "TIBC", "IRON", "RETICCTPCT" in the last 72 hours.   Radiology: PERIPHERAL VASCULAR CATHETERIZATION Result Date: 04/02/2023 See surgical note for result.  CT Angio Chest PE W and/or  Wo Contrast Result Date: 03/28/2023 CLINICAL DATA:  Shortness of breath EXAM: CT ANGIOGRAPHY CHEST WITH  CONTRAST TECHNIQUE: Multidetector CT imaging of the chest was performed using the standard protocol during bolus administration of intravenous contrast. Multiplanar CT image reconstructions and MIPs were obtained to evaluate the vascular anatomy. RADIATION DOSE REDUCTION: This exam was performed according to the departmental dose-optimization program which includes automated exposure control, adjustment of the mA and/or kV according to patient size and/or use of iterative reconstruction technique. CONTRAST:  75mL OMNIPAQUE IOHEXOL 350 MG/ML SOLN COMPARISON:  CT angiogram 02/25/2023.  X-ray 03/28/2023 and older. FINDINGS: Cardiovascular: Heart is enlarged. Trace pericardial fluid. Coronary artery calcifications are seen. Minimal contrast opacification along the thoracic aorta. Grossly normal course and caliber with vascular calcifications. There is some dilatation of the main pulmonary artery. Please correlate for pulmonary artery hypertension. There is a pulmonary embolism identified in the upper aspect of the middle lobe as seen on series 6, image 213. This has not seen on the previous examination. There is also embolus along the lingula on series 6, image 180, not seen previously. Overall mild clot burden. No larger or more central embolus. Some limited evaluation related to motion. Mediastinum/Nodes: Patulous esophagus. Heterogeneous thyroid gland. There are several prominent nodes identified in the mediastinum and hilum bilaterally, similar to previous. No axillary nodes. Lungs/Pleura: Trace pleural fluid with the adjacent parenchymal opacities. The opacities are increasing from previous at the lower lobes. There are also some dependent areas in the upper lobes. There is some new nodules identified as well in the left upper lobe measuring 10 mm on series 5, image 38. With the time course favor a infectious or inflammatory process as this has not seen previously. There are areas of bronchial wall thickening,  mucous plugging and interstitial septal thickening. Few areas of ground-glass as well. Upper Abdomen: The adrenal glands are preserved in the upper abdomen. Stomach is relatively collapsed punctate nonobstructing right-sided renal stone at the edge of the imaging field. Musculoskeletal: Surgical changes about the left shoulder. Scattered degenerative changes along the spine. Review of the MIP images confirms the above findings. Critical Value/emergent results were called by telephone at the time of interpretation on 03/28/2023 at 4:21 pm EST to provider Dr. Alvester Morin, who verbally acknowledged these results. IMPRESSION: New bilateral few segmental and smaller pulmonary emboli. Mild clot burden. No larger or more central embolus. There is some enlargement of the main pulmonary arteries. Please correlate for pulmonary artery hypertension. This has seen previously. Enlarged heart. Increasing parenchymal bilateral lung opacities identified with some subtle nodularity, ground-glass and interstitial septal thickening. Few presumed reactive nodes. Recommend follow up to confirm resolution. Persistent tiny pleural effusions, left-greater-than-right. Aortic Atherosclerosis (ICD10-I70.0). Electronically Signed   By: Karen Kays M.D.   On: 03/28/2023 16:42   CT HEAD WO CONTRAST ( ) Result Date: 03/28/2023 CLINICAL DATA:  Mental status change, unknown cause. EXAM: CT HEAD WITHOUT CONTRAST TECHNIQUE: Contiguous axial images were obtained from the base of the skull through the vertex without intravenous contrast. RADIATION DOSE REDUCTION: This exam was performed according to the departmental dose-optimization program which includes automated exposure control, adjustment of the mA and/or kV according to patient size and/or use of iterative reconstruction technique. COMPARISON:  None Available. FINDINGS: Brain: There is no evidence of an acute infarct, intracranial hemorrhage, mass, midline shift, or extra-axial fluid collection.  Mild cerebral atrophy is within normal limits for age. Hypodensities in the cerebral white matter nonspecific but compatible with mild chronic small vessel ischemic  disease. Vascular: Calcified atherosclerosis at the skull base. No hyperdense vessel. Skull: No acute fracture or suspicious osseous lesion. Sinuses/Orbits: Chronic left sphenoid sinusitis with near complete opacification. Mild mucosal thickening in the paranasal sinuses elsewhere. Clear mastoid air cells. Bilateral cataract extraction. Other: None. IMPRESSION: 1. No evidence of acute intracranial abnormality. 2. Mild chronic small vessel ischemic disease. Electronically Signed   By: Sebastian Ache M.D.   On: 03/28/2023 15:52   DG Chest Portable 1 View Result Date: 03/28/2023 CLINICAL DATA:  Shortness of breath EXAM: PORTABLE CHEST - 1 VIEW COMPARISON:  02/25/2023 FINDINGS: Unchanged borderline cardiomegaly. No significant pulmonary vascular congestion. Unchanged mild left basilar opacity favored to be atelectasis. Reversed left total shoulder prosthesis again seen. IMPRESSION: 1. Unchanged mild cardiomegaly. 2. Unchanged left basilar opacity likely due to atelectasis. Electronically Signed   By: Acquanetta Belling M.D.   On: 03/28/2023 11:42    ECHO 01/2023: 1. Left ventricular ejection fraction, by estimation, is 60 to 65%. The left ventricle has normal function. The left ventricle has no regional wall motion abnormalities. There is moderate left ventricular hypertrophy. Indeterminate diastolic filling due  to E-A fusion.   2. Right ventricular systolic function is normal. The right ventricular size is normal.   3. Left atrial size was moderately dilated.   4. Right atrial size was mildly dilated.   5. The mitral valve is normal in structure. Mild mitral valve  regurgitation. No evidence of mitral stenosis.   6. The aortic valve is normal in structure. Aortic valve regurgitation is  not visualized. Mild aortic valve stenosis.   7. The inferior  vena cava is normal in size with greater than 50%  respiratory variability, suggesting right atrial pressure of 3 mmHg.   TELEMETRY reviewed by me 04/04/2023: atrial fibrillation rate 80s  EKG reviewed by me: atrial fibrillation RVR rate 118 bom  Data reviewed by me 04/04/2023: last 24h vitals tele labs imaging I/O hospitalist progress note, ENT notes, vascular surgery notes  Principal Problem:   Acute respiratory failure with hypoxia (HCC) Active Problems:   Chronic kidney disease, stage 3a (HCC)   DM (diabetes mellitus) (HCC)   HTN (hypertension)   Acute hypoxic respiratory failure (HCC)   Atrial fibrillation with RVR (HCC)   GERD (gastroesophageal reflux disease)   Acute heart failure with preserved ejection fraction (HFpEF) (HCC)   Encephalopathy   Epistaxis    ASSESSMENT AND PLAN:  Lundynn Hollingworth is a 87 y.o. female  with a past medical history of  mild aortic stenosis, bradycardia, type 2 diabetes, hypercholesterolemia  who presented to the ED on 03/28/2023 for shortness of breath, fatigue. Cardiology was consulted for further evaluation.   # Acute hypoxic respiratory failure # Acute on chronic HFpEF # Acute pulmonary emboli Patient with worsening SOB and fatigue over the last few days, brought to ED yesterday. Initially required BiPAP due to hypoxia now on 8L HFNC. BNP elevated at 661. Troponins negative x2 at 9 > 9. CXR without significant pulmonary edema. CTA chest with few bilateral segmental and small PE, mild clot burden. -Continue IV heparin. Patient with epistaxis on DOAC before and now with heparin, s/p embolization 12/10 with vascular surgery. Consider transition to DOAC today vs tomorrow. -Continue lasix 20 mg daily.  -Further management of PE per primary.   # Persistent atrial fibrillation # Atrial fibrillation with rapid ventricular response Patient with hx of AF, multiple recent hospitalizations with difficult to control rate. Most recently was placed on  metoprolol, diltiazem, and digoxin  for rate control. Dig level reported at 2.6 but this was corrected to 0.2 per lab.  -Continue consolidated dosing of Diltiazem 360 mg once daily this AM. Continue metoprolol 50 mg twice daily, digoxin 0.125 mg every other day.  -Continue heparin, would benefit from DOAC on DC. -Not a candidate for cardioversion as she would need TEE prior but given oxygen requirement this would be higher risk for complication. If she is amenable to discharging home on anticoagulation then could consider DCCV or antiarrhythmic medication in future.  # Hyperlipidemia -Continue rosuvastatin 10 mg daily and aspirin 81 mg daily.    This patient's plan of care was discussed and created with Dr. Juliann Pares and he is in agreement.  Signed: Gale Journey, PA-C  04/04/2023, 10:53 AM [facility]

## 2023-04-04 NOTE — Progress Notes (Addendum)
Pt blood sugar at 408. NP Jon Billings made aware. Will continue to monitor.  Update 2131: See new order. Will continue to monitor.  Update 2347: NP Morrioson update of venous glucose at 391. Will continue to monitor.  Update 2352: See new orders/ Will continue to monitor.

## 2023-04-04 NOTE — Inpatient Diabetes Management (Signed)
Inpatient Diabetes Program Recommendations  AACE/ADA: New Consensus Statement on Inpatient Glycemic Control (2015)  Target Ranges:  Prepandial:   less than 140 mg/dL      Peak postprandial:   less than 180 mg/dL (1-2 hours)      Critically ill patients:  140 - 180 mg/dL   Lab Results  Component Value Date   GLUCAP 231 (H) 04/04/2023   HGBA1C 7.6 (H) 01/31/2023    Review of Glycemic Control  Latest Reference Range & Units 04/04/23 07:49 04/04/23 12:05  Glucose-Capillary 70 - 99 mg/dL 536 (H) 644 (H)  (H): Data is abnormally high  Diabetes history: DM Outpatient Diabetes medications: Lantus 36 units every day, Humalog 7-19 units TID, Farxiga 10 mg QD Current orders for Inpatient glycemic control: Semglee 30 units at bedtime, Novolog 0-15 units TID and Novolog 6 units TID    Inpatient Diabetes Program Recommendations:    Novolog 8 units TID with meals if she consumes at least 50%.  Will continue to follow while inpatient.  Thank you, Dulce Sellar, MSN, CDCES Diabetes Coordinator Inpatient Diabetes Program [phone] (team pager from 8a-5p)

## 2023-04-04 NOTE — Plan of Care (Signed)
  Problem: Education: Goal: Knowledge of General Education information will improve Description: Including pain rating scale, medication(s)/side effects and non-pharmacologic comfort measures Outcome: Progressing   Problem: Clinical Measurements: Goal: Ability to maintain clinical measurements within normal limits will improve Outcome: Progressing   Problem: Clinical Measurements: Goal: Respiratory complications will improve Outcome: Progressing   Problem: Clinical Measurements: Goal: Cardiovascular complication will be avoided Outcome: Progressing   Problem: Elimination: Goal: Will not experience complications related to urinary retention Outcome: Progressing   Problem: Pain Management: Goal: General experience of comfort will improve Outcome: Progressing   Problem: Safety: Goal: Ability to remain free from injury will improve Outcome: Progressing

## 2023-04-05 DIAGNOSIS — I2699 Other pulmonary embolism without acute cor pulmonale: Secondary | ICD-10-CM | POA: Diagnosis not present

## 2023-04-05 DIAGNOSIS — I482 Chronic atrial fibrillation, unspecified: Secondary | ICD-10-CM | POA: Diagnosis not present

## 2023-04-05 LAB — CBC
HCT: 39.1 % (ref 36.0–46.0)
Hemoglobin: 13 g/dL (ref 12.0–15.0)
MCH: 30 pg (ref 26.0–34.0)
MCHC: 33.2 g/dL (ref 30.0–36.0)
MCV: 90.1 fL (ref 80.0–100.0)
Platelets: 291 10*3/uL (ref 150–400)
RBC: 4.34 MIL/uL (ref 3.87–5.11)
RDW: 14.6 % (ref 11.5–15.5)
WBC: 8.6 10*3/uL (ref 4.0–10.5)
nRBC: 0 % (ref 0.0–0.2)

## 2023-04-05 LAB — GLUCOSE, CAPILLARY
Glucose-Capillary: 290 mg/dL — ABNORMAL HIGH (ref 70–99)
Glucose-Capillary: 154 mg/dL — ABNORMAL HIGH (ref 70–99)
Glucose-Capillary: 323 mg/dL — ABNORMAL HIGH (ref 70–99)
Glucose-Capillary: 286 mg/dL — ABNORMAL HIGH (ref 70–99)
Glucose-Capillary: 315 mg/dL — ABNORMAL HIGH (ref 70–99)

## 2023-04-05 MED ORDER — INSULIN ASPART 100 UNIT/ML IJ SOLN
10.0000 [IU] | Freq: Three times a day (TID) | INTRAMUSCULAR | Status: DC
  Administered 2023-04-05 – 2023-04-08 (×10): 10 [IU] via SUBCUTANEOUS
  Filled 2023-04-05 (×9): qty 1

## 2023-04-05 NOTE — Progress Notes (Signed)
PROGRESS NOTE    Samantha Freeman  ZOX:[id number] DOB: February 23, 1936 DOA: 03/28/2023 PCP: Marguarite Arbour, MD    Assessment & Plan:   Principal Problem:   Acute respiratory failure with hypoxia (HCC) Active Problems:   Atrial fibrillation with RVR (HCC)   Acute hypoxic respiratory failure (HCC)   Acute heart failure with preserved ejection fraction (HFpEF) (HCC)   Chronic kidney disease, stage 3a (HCC)   DM (diabetes mellitus) (HCC)   HTN (hypertension)   GERD (gastroesophageal reflux disease)   Encephalopathy   Epistaxis  Assessment and Plan: Persistent a. fib: continue on digoxin, diltiazem, metoprolol & eliquis as per cardio. D/c IV heparin. Not a candidate for cardioversion as she would need TEE prior but given oxygen requirement pt would be at higher risk as per cardio. Cardio following and recs apprec    Recurrent epistaxis: s/p left maxillary artery embolization as per vasc surg. Likely secondary to IV heparin. Hx of nose bleeds. Continue w/ humidified oxygen & nasal spray. Will f/u outpatient w/ vasc surg in 1 month    Acute hypoxic respiratory failure: likely secondary to acute PE & acute on chronic diastolic CHF. Continue on supplemental oxygen and wean as tolerated   Acute PE: continue on eliquis. D/c IV heparin    Acute on chronic diastolic CHF: echo w/ EF 60-65%. 1=2+ pitting edema. S/p IV lasix. Continue on lasix as per cardio. Monitor I/Os    Encephalopathy: w/ transient episode of a hallucination. CT head shows no acute intracranial abnormalities. Resolved    GERD: continue on PPI    HTN:  continue on metoprolol, diltiazem    DM2: fair control, HbA1c 7.6. Continue on glargine, SSI w/ accuchecks    CKDIIIa: Cr is better than baseline currently        DVT prophylaxis: eliquis  Code Status: DNR  Family Communication: discussed pt's care w/ pt's son at bedside and answered his questions  Disposition Plan: OT/PT recs SNF but pt refuses SNF and agrees to [facility]    Level of care: Progressive  Status is: Inpatient Remains inpatient appropriate because: severity of illness    Consultants:  Cardio   Procedures:  Antimicrobials:   Subjective: Pt c/o malaise  Objective: Vitals:   04/04/23 1935 04/04/23 2351 04/05/23 0408 04/05/23 0742  BP: 129/85  124/63 120/75  Pulse: (!) 108   69  Resp: 20  20 17   Temp: 98.9 F (37.2 C) 98.2 F (36.8 C) 98 F (36.7 C) 97.8 F (36.6 C)  TempSrc: Oral Oral Oral   SpO2: 94%  92% 93%  Weight:      Height:        Intake/Output Summary (Last 24 hours) at 04/05/2023 0843 Last data filed at 04/05/2023 8119 Gross per 24 hour  Intake 573.03 ml  Output 500 ml  Net 73.03 ml   Filed Weights   03/28/23 1029 03/28/23 2145  Weight: 86.1 kg 81 kg    Examination:  General exam: appears calm & comfortable  Respiratory system: decreased breath sounds b/l  Cardiovascular system: irregularly irregular  Gastrointestinal system: abd is soft, NT, ND & normal bowel sounds  Central nervous system: alert & oriented. Moves all extremities  Psychiatry: judgement and insight appears normal. Appropriate mood and affect     Data Reviewed: I have personally reviewed following labs and imaging studies  CBC: Recent Labs  Lab 04/01/23 0604 04/02/23 0409 04/03/23 0441 04/04/23 0517 04/05/23 0616  WBC 6.5 7.0 8.8 8.3 8.6  HGB 13.7 12.7  13.2 13.3 13.0  HCT 41.3 38.2 39.8 39.9 39.1  MCV 88.2 90.3 90.2 88.3 90.1  PLT 226 235 256 264 291   Basic Metabolic Panel: Recent Labs  Lab 03/30/23 0637 03/31/23 0622 04/01/23 0604 04/02/23 0409 04/03/23 0441 04/04/23 0517 04/04/23 2155  NA 134* 132* 132* 132* 131* 136  --   K 3.7 3.8 4.0 3.9 3.9 3.8  --   CL 96* 94* 98 97* 97* 98  --   CO2 28 26 24 27 26 29   --   GLUCOSE 204* 263* 296* 286* 272* 162* 391*  BUN 24* 26* 25* 23 20 16   --   CREATININE 1.12* 0.98 0.91 0.88 0.79 0.73  --   CALCIUM 9.0 9.3 9.1 9.1 8.9 9.2  --   MG 1.8 1.8 1.6* 1.4* 1.8  --   --     GFR: Estimated Creatinine Clearance: 52.9 mL/min (by C-G formula based on SCr of 0.73 mg/dL). Liver Function Tests: No results for input(s): "AST", "ALT", "ALKPHOS", "BILITOT", "PROT", "ALBUMIN" in the last 168 hours.  No results for input(s): "LIPASE", "AMYLASE" in the last 168 hours. No results for input(s): "AMMONIA" in the last 168 hours. Coagulation Profile: No results for input(s): "INR", "PROTIME" in the last 168 hours.  Cardiac Enzymes: No results for input(s): "CKTOTAL", "CKMB", "CKMBINDEX", "TROPONINI" in the last 168 hours. BNP (last 3 results) No results for input(s): "PROBNP" in the last 8760 hours. HbA1C: No results for input(s): "HGBA1C" in the last 72 hours. CBG: Recent Labs  Lab 04/04/23 1205 04/04/23 1635 04/04/23 2109 04/05/23 0133 04/05/23 0744  GLUCAP 231* 226* 408* 290* 154*   Lipid Profile: No results for input(s): "CHOL", "HDL", "LDLCALC", "TRIG", "CHOLHDL", "LDLDIRECT" in the last 72 hours. Thyroid Function Tests: No results for input(s): "TSH", "T4TOTAL", "FREET4", "T3FREE", "THYROIDAB" in the last 72 hours. Anemia Panel: No results for input(s): "VITAMINB12", "FOLATE", "FERRITIN", "TIBC", "IRON", "RETICCTPCT" in the last 72 hours. Sepsis Labs: No results for input(s): "PROCALCITON", "LATICACIDVEN" in the last 168 hours.   Recent Results (from the past 240 hours)  SARS Coronavirus 2 by RT PCR (hospital order, performed in [facility] hospital lab) *cepheid single result test* Anterior Nasal Swab     Status: None   Collection Time: 03/28/23 10:38 AM   Specimen: Anterior Nasal Swab  Result Value Ref Range Status   SARS Coronavirus 2 by RT PCR NEGATIVE NEGATIVE Final    Comment: (NOTE) SARS-CoV-2 target nucleic acids are NOT DETECTED.  The SARS-CoV-2 RNA is generally detectable in upper and lower respiratory specimens during the acute phase of infection. The lowest concentration of SARS-CoV-2 viral copies this assay can detect is 250 copies /  mL. A negative result does not preclude SARS-CoV-2 infection and should not be used as the sole basis for treatment or other patient management decisions.  A negative result may occur with improper specimen collection / handling, submission of specimen other than nasopharyngeal swab, presence of viral mutation(s) within the areas targeted by this assay, and inadequate number of viral copies (<250 copies / mL). A negative result must be combined with clinical observations, patient history, and epidemiological information.  Fact Sheet for Patients:   RoadLapTop.co.za  Fact Sheet for Healthcare Providers: http://kim-miller.com/  This test is not yet approved or  cleared by the [place] FDA and has been authorized for detection and/or diagnosis of SARS-CoV-2 by FDA under an Emergency Use Authorization (EUA).  This EUA will remain in effect (meaning this test can be used)  for the duration of the COVID-19 declaration under Section 564(b)(1) of the Act, 21 U.S.C. section 360bbb-3(b)(1), unless the authorization is terminated or revoked sooner.  Performed at [facility], [address] [place], [place] [address]   MRSA Next Gen by PCR, Nasal     Status: None   Collection Time: 03/28/23  9:59 PM   Specimen: Nasal Mucosa; Nasal Swab  Result Value Ref Range Status   MRSA by PCR Next Gen NOT DETECTED NOT DETECTED Final    Comment: (NOTE) The GeneXpert MRSA Assay (FDA approved for NASAL specimens only), is one component of a comprehensive MRSA colonization surveillance program. It is not intended to diagnose MRSA infection nor to guide or monitor treatment for MRSA infections. Test performance is not FDA approved in patients less than 21 years old. Performed at [facility], [address] [place], [place] [address]          Radiology Studies: No results found.       Scheduled Meds:  apixaban  10 mg Oral  BID   Followed by   Melene Muller ON 04/11/2023] apixaban  5 mg Oral BID   aspirin EC  81 mg Oral Daily   digoxin  0.125 mg Oral QODAY   diltiazem  360 mg Oral Daily   docusate sodium  200 mg Oral BID   feeding supplement (GLUCERNA SHAKE)  237 mL Oral TID BM   furosemide  20 mg Oral Daily   gabapentin  200 mg Oral TID   insulin aspart  0-15 Units Subcutaneous TID WC   insulin aspart  6 Units Subcutaneous TID WC   insulin glargine-yfgn  30 Units Subcutaneous QHS   metoprolol tartrate  50 mg Oral BID   mupirocin ointment   Nasal BID   pantoprazole  40 mg Oral Daily   PARoxetine  10 mg Oral Daily   prednisoLONE acetate  1 drop Both Eyes QHS   rOPINIRole  1 mg Oral QHS   rosuvastatin  10 mg Oral Daily   sodium chloride  2 spray Each Nare QID   Continuous Infusions:     LOS: 8 days       Charise Killian, MD [facility] Pager [phone]  If 7PM-7AM, please contact night-coverage [email or web address] 04/05/2023, 8:43 AM

## 2023-04-05 NOTE — Plan of Care (Signed)

## 2023-04-05 NOTE — Plan of Care (Signed)
  Problem: Education: Goal: Knowledge of General Education information will improve Description: Including pain rating scale, medication(s)/side effects and non-pharmacologic comfort measures Outcome: Progressing   Problem: Health Behavior/Discharge Planning: Goal: Ability to manage health-related needs will improve Outcome: Progressing   Problem: Clinical Measurements: Goal: Will remain free from infection Outcome: Progressing   Problem: Clinical Measurements: Goal: Respiratory complications will improve Outcome: Progressing   Problem: Clinical Measurements: Goal: Cardiovascular complication will be avoided Outcome: Progressing   Problem: Activity: Goal: Risk for activity intolerance will decrease Outcome: Progressing   Problem: Elimination: Goal: Will not experience complications related to bowel motility Outcome: Progressing   Problem: Elimination: Goal: Will not experience complications related to urinary retention Outcome: Progressing   Problem: Pain Management: Goal: General experience of comfort will improve Outcome: Progressing   Problem: Safety: Goal: Ability to remain free from injury will improve Outcome: Progressing

## 2023-04-05 NOTE — Inpatient Diabetes Management (Signed)
Inpatient Diabetes Program Recommendations  AACE/ADA: New Consensus Statement on Inpatient Glycemic Control (2015)  Target Ranges:  Prepandial:   less than 140 mg/dL      Peak postprandial:   less than 180 mg/dL (1-2 hours)      Critically ill patients:  140 - 180 mg/dL   Lab Results  Component Value Date   GLUCAP 154 (H) 04/05/2023   HGBA1C 7.6 (H) 01/31/2023    Review of Glycemic Control  Latest Reference Range & Units 04/04/23 07:49 04/04/23 12:05 04/04/23 16:35 04/04/23 21:09 04/05/23 01:33 04/05/23 07:44  Glucose-Capillary 70 - 99 mg/dL 409 (H) 811 (H) 914 (H) 408 (H) 290 (H) 154 (H)  (H): Data is abnormally high  Diabetes history: DM Outpatient Diabetes medications: Lantus 36 units every day, Humalog 7-19 units TID, Farxiga 10 mg QD Current orders for Inpatient glycemic control: Semglee 30 units at bedtime, Novolog 0-15 units TID and Novolog 6 units TID     Inpatient Diabetes Program Recommendations:    Postprandials elevated.  Please consider:   Novolog 10 units TID with meals if she consumes at least 50%.   Will continue to follow while inpatient.   Thank you, Dulce Sellar, MSN, CDCES Diabetes Coordinator Inpatient Diabetes Program [phone] (team pager from 8a-5p)

## 2023-04-05 NOTE — Progress Notes (Signed)
Occupational Therapy Treatment Patient Details Name: Samantha Freeman MRN: [id number] DOB: [birth year] Today's Date: 04/05/2023   History of present illness Pt is a 87 y.o. female admitted with AFIB with RVR, acute hypoxic respiratory failure, acute heart failure, & encephalopathy. PMH significant for atrial fibrillation not on AC, HFpEF, type 2 diabetes, hypertension, hyperlipidemia, CKD stage IIIa   OT comments  Pt seen for OT tx. Pt pleasant, agreeable, up in recliner, and denies complaints. Pt set up for most of UB and LB bathing while seated, requiring MIN A for washing her back and SBA-CGA for LB bathing when in a standing position. Able to use a seated figure four technique to don socks and to wash BLE. Pt instructed in home/routines modifications and AE to improve safety and independence with showering. Pt verbalized understanding. Pt on room air throughout session, denied SOB. SpO2 with reliable pleth ranging from 89-92% with exertion. HR in 110's-120's with standing for LB bathing. Pt progressing towards goals. Continues to benefit from skilled OT services <3hr/day.       If plan is discharge home, recommend the following:  A little help with walking and/or transfers;A little help with bathing/dressing/bathroom;Assistance with cooking/housework;Assist for transportation;Help with stairs or ramp for entrance   Equipment Recommendations  Other (comment) (defer to next venue)    Recommendations for Other Services      Precautions / Restrictions Precautions Precautions: Fall Precaution Comments: watch HR and O2 Restrictions Weight Bearing Restrictions Per Provider Order: No       Mobility Bed Mobility               General bed mobility comments: NT, pt in recliner at start and end of session    Transfers Overall transfer level: Needs assistance Equipment used: Rolling walker (2 wheels) Transfers: Sit to/from Stand Sit to Stand: Contact guard assist                  Balance Overall balance assessment: Needs assistance Sitting-balance support: Feet supported Sitting balance-Leahy Scale: Good     Standing balance support: During functional activity, Bilateral upper extremity supported Standing balance-Leahy Scale: Fair                             ADL either performed or assessed with clinical judgement   ADL       Grooming: Sitting;Set up;Wash/dry face;Wash/dry hands;Brushing hair   Upper Body Bathing: Sitting;Set up;Supervision/ safety   Lower Body Bathing: Sit to/from stand;Contact guard assist       Lower Body Dressing: Set up;Supervision/safety;Sitting/lateral leans Lower Body Dressing Details (indicate cue type and reason): dons socks using figure four                    Extremity/Trunk Assessment              Vision       Perception     Praxis      Cognition Arousal: Alert Behavior During Therapy: WFL for tasks assessed/performed Overall Cognitive Status: Within Functional Limits for tasks assessed                                          Exercises Other Exercises Other Exercises: Pt instructed in home/routines modifications and AE to improve safety and independence with showering.    Shoulder Instructions  General Comments Pt on room air throughout session, denied SOB. SpO2 with reliable pleth ranging from 89-92% with exertion. HR in 110's-120's with standing for LB bathing.    Pertinent Vitals/ Pain       Pain Assessment Pain Assessment: No/denies pain  Home Living                                          Prior Functioning/Environment              Frequency  Min 1X/week        Progress Toward Goals  OT Goals(current goals can now be found in the care plan section)     Acute Rehab OT Goals Patient Stated Goal: go to rehab OT Goal Formulation: With patient Time For Goal Achievement: 04/13/23 Potential to Achieve Goals: Good   Plan      Co-evaluation                 AM-PAC OT "6 Clicks" Daily Activity     Outcome Measure   Help from another person eating meals?: None Help from another person taking care of personal grooming?: A Little Help from another person toileting, which includes using toliet, bedpan, or urinal?: A Little Help from another person bathing (including washing, rinsing, drying)?: A Little Help from another person to put on and taking off regular upper body clothing?: A Little Help from another person to put on and taking off regular lower body clothing?: A Little 6 Click Score: 19    End of Session Equipment Utilized During Treatment: Rolling walker (2 wheels)  OT Visit Diagnosis: Other abnormalities of gait and mobility (R26.89);Unsteadiness on feet (R26.81);Muscle weakness (generalized) (M62.81)   Activity Tolerance Patient tolerated treatment well   Patient Left with call bell/phone within reach;with chair alarm set   Nurse Communication          Time: [phone] OT Time Calculation (min): 29 min  Charges: OT General Charges $OT Visit: 1 Visit OT Treatments $Self Care/Home Management : 23-37 mins  Arman Filter., MPH, MS, OTR/L ascom [phone] 04/05/23, 4:03 PM

## 2023-04-05 NOTE — TOC Progression Note (Signed)
Transition of Care [facility]) - Progression Note    Patient Details  Name: Samantha Freeman MRN: [id number] Date of Birth: 09/30/1935  [facility] CM/SW Contact  Truddie Hidden, RN Phone Number: 04/05/2023, 4:29 PM  Clinical Narrative:    Per Jennette Kettle at [facility], patient is not active with [facility]   Referral sent and accepted by Adelina Mings at [facility].    Expected Discharge Plan: Home w Home Health Services Barriers to Discharge: Continued Medical Work up  Expected Discharge Plan and Services In-house Referral: Clinical Social Work   Post Acute Care Choice: NA Loreli Slot) Living arrangements for the past 2 months: Single Family Home                   DME Agency: Other - Comment (Patient with [facility]) Date DME Agency Contacted: 03/28/23 Time DME Agency Contacted: 1620 Representative spoke with at DME Agency: Jennette Kettle [phone] HH Arranged: PT, OT HH Agency: NA ([facility])     Representative spoke with at [facility] Agency: Deanna [phone]   Social Determinants of Health (SDOH) Interventions SDOH Screenings   Food Insecurity: No Food Insecurity (03/28/2023)  Housing: Low Risk  (03/28/2023)  Transportation Needs: No Transportation Needs (03/28/2023)  Utilities: Not At Risk (03/28/2023)  Tobacco Use: Low Risk  (03/28/2023)    Readmission Risk Interventions    03/28/2023    4:20 PM  Readmission Risk Prevention Plan  Transportation Screening Complete  PCP or Specialist Appt within 3-5 Days Complete  HRI or Home Care Consult Complete  Social Work Consult for Recovery Care Planning/Counseling Complete  Palliative Care Screening Not Applicable  Medication Review Oceanographer) Complete

## 2023-04-05 NOTE — Progress Notes (Signed)
[name] CLINIC CARDIOLOGY PROGRESS NOTE       Patient ID: Samantha Freeman MRN: [id number] DOB/AGE: 08-05-1935 87 y.o.  Admit date: 03/28/2023 Referring Physician Dr. Doree Albee Primary Physician Sparks, Duane Lope, MD  Primary Cardiologist Dr. Darrold Junker Reason for Consultation AoCHF, AF RVR  HPI: Samantha Freeman is a 87 y.o. female  with a past medical history of  mild aortic stenosis, bradycardia, type 2 diabetes, hypercholesterolemia  who presented to the ED on 03/28/2023 for shortness of breath, fatigue. Cardiology was consulted for further evaluation.   Interval history: -Patient seen and examined this AM, continues to improve. Feeling much better overall.  -SOB continues to improve. Denies any bleeding or oozing from her nose. -Remains in AF with controlled rate. Denies CP, palpitations.   Review of systems complete and found to be negative unless listed above    Past Medical History:  Diagnosis Date   Anemia    Anxiety    Aortic stenosis, mild    Arthritis    CKD (chronic kidney disease), stage III (HCC)    Complication of anesthesia    Propofol anaphylaxis   Cortical cataract    DDD (degenerative disc disease), lumbar    Depression    Dyspnea    GERD (gastroesophageal reflux disease)    Headache    Heart murmur    History of 2019 novel coronavirus disease (COVID-19) 12/01/2018   HLD (hyperlipidemia)    Hypertension    Pneumonia    Schatzki's ring    Seasonal allergies    T2DM (type 2 diabetes mellitus) (HCC)    Valvular insufficiency     Past Surgical History:  Procedure Laterality Date   ABDOMINAL HYSTERECTOMY     APPENDECTOMY     BICEPT TENODESIS Right 05/13/2018   Procedure: BICEPS TENODESIS;  Surgeon: Christena Flake, MD;  Location: [facility] ORS;  Service: Orthopedics;  Laterality: Right;   CARDIAC CATHETERIZATION     COLONOSCOPY     EMBOLIZATION (CATH LAB) Bilateral 04/02/2023   Procedure: EMBOLIZATION;  Surgeon: Renford Dills, MD;  Location: [facility]  INVASIVE CV LAB;  Service: Cardiovascular;  Laterality: Bilateral;   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   EYE SURGERY Right 09/2013   Corneal transplant   JOINT REPLACEMENT Right 2015   knee   REVERSE SHOULDER ARTHROPLASTY Left 10/13/2020   Procedure: REVERSE SHOULDER ARTHROPLASTY;  Surgeon: Christena Flake, MD;  Location: [facility] ORS;  Service: Orthopedics;  Laterality: Left;   SHOULDER ARTHROSCOPY WITH ROTATOR CUFF REPAIR Right 05/13/2018   Procedure: SHOULDER ARTHROSCOPY WITH DEBRIDEMENT, DECOMPRESSION AND ROTATOR CUFF REPAIR;  Surgeon: Christena Flake, MD;  Location: [facility] ORS;  Service: Orthopedics;  Laterality: Right;   TONSILLECTOMY      Medications Prior to Admission  Medication Sig Dispense Refill Last Dose/Taking   ALPRAZolam (XANAX) 0.5 MG tablet Take 0.5 mg by mouth at bedtime as needed for anxiety or sleep.   03/27/2023   aspirin EC 81 MG tablet Take 1 tablet (81 mg total) by mouth daily. Swallow whole. 30 tablet 0 03/27/2023   Calcium Carb-Cholecalciferol (CALCIUM 600/VITAMIN D3 PO) Take 1 tablet by mouth daily.   03/27/2023   Coenzyme Q10 10 MG capsule Take 10 mg by mouth every morning.   03/27/2023   dapagliflozin propanediol (FARXIGA) 10 MG TABS tablet Take 10 mg by mouth daily.   03/27/2023   digoxin (LANOXIN) 0.125 MG tablet Take 1 tablet (0.125 mg total) by mouth every other day. 15 tablet 0 03/27/2023  diltiazem (CARDIZEM CD) 180 MG 24 hr capsule Take 1 capsule (180 mg total) by mouth daily. 30 capsule 0 03/27/2023   fenofibrate (TRICOR) 48 MG tablet Take 1 tablet by mouth daily.   03/27/2023   furosemide (LASIX) 20 MG tablet Take 20 mg by mouth daily as needed for fluid or edema.   prn at unknown   gabapentin (NEURONTIN) 100 MG capsule Take 200 mg by mouth 3 (three) times daily.   03/27/2023   hydrOXYzine (ATARAX) 25 MG tablet Take 1 tablet by mouth 2 (two) times daily.   03/27/2023   insulin lispro (HUMALOG) 100 UNIT/ML injection Inject 7-19 Units into the skin 3 (three)  times daily before meals. Blood Glucose level: 140-199 - 7 units, 200-250 - 9 units, 251-299 - 13 units,  300-349 - 17 units,  350 or above 19 units.   03/27/2023   LANTUS SOLOSTAR 100 UNIT/ML Solostar Pen Inject 36 Units into the skin at bedtime.   03/27/2023   magnesium oxide (MAG-OX) 400 MG tablet Take 800 mg by mouth 3 (three) times daily.   03/27/2023   metoprolol tartrate (LOPRESSOR) 50 MG tablet Take 1 tablet (50 mg total) by mouth 2 (two) times daily. 60 tablet 0 03/27/2023   Multiple Vitamin (MULTIVITAMIN) tablet Take 1 tablet by mouth daily. Women's Daily Multivitamin   03/27/2023   pantoprazole (PROTONIX) 40 MG tablet Take 1 tablet (40 mg total) by mouth daily.   03/27/2023   PARoxetine (PAXIL) 10 MG tablet Take 10 mg by mouth daily.   03/27/2023   prednisoLONE acetate (PRED FORTE) 1 % ophthalmic suspension Place 1 drop into both eyes at bedtime.   03/27/2023   rOPINIRole (REQUIP) 1 MG tablet Take 1 mg by mouth at bedtime.   03/27/2023   rosuvastatin (CRESTOR) 10 MG tablet Take 10 mg by mouth daily.   03/27/2023   vitamin B-12 (CYANOCOBALAMIN) 1000 MCG tablet Take 1,000 mcg by mouth daily.   03/27/2023   blood glucose meter kit and supplies KIT Dispense based on patient and insurance preference. Use up to four times daily as directed. (FOR ICD-9 250.00, 250.01). For QAC - HS accuchecks. 1 each 1    ferrous sulfate 325 (65 FE) MG tablet Take 325 mg by mouth daily with breakfast. (Patient not taking: Reported on 02/25/2023)      glucose blood (FREESTYLE LITE) test strip For glucose testing every before meals at bedtime. Diagnosis E 11.65  Can substitute to any accepted brand 100 each 0    Insulin Syringe-Needle U-100 25G X 1" 1 ML MISC For 4 times a day insulin SQ, 1 month supply. Diagnosis E11.65 30 each 0    traZODone (DESYREL) 50 MG tablet Take 50 mg by mouth at bedtime. (Patient not taking: Reported on 03/28/2023)   Not Taking   Social History   Socioeconomic History   Marital status: Widowed     Spouse name: Not on file   Number of children: Not on file   Years of education: Not on file   Highest education level: Not on file  Occupational History   Not on file  Tobacco Use   Smoking status: Never   Smokeless tobacco: Never  Vaping Use   Vaping status: Never Used  Substance and Sexual Activity   Alcohol use: Never   Drug use: Never   Sexual activity: Not Currently  Other Topics Concern   Not on file  Social History Narrative   Lives alone, has support from son who is  a Optician, dispensing and daughter in Social worker. Will be with mother when she has surgery.   Social Drivers of Corporate investment banker Strain: Not on file  Food Insecurity: No Food Insecurity (03/28/2023)   Hunger Vital Sign    Worried About Running Out of Food in the Last Year: Never true    Ran Out of Food in the Last Year: Never true  Transportation Needs: No Transportation Needs (03/28/2023)   PRAPARE - Administrator, Civil Service (Medical): No    Lack of Transportation (Non-Medical): No  Physical Activity: Not on file  Stress: Not on file  Social Connections: Not on file  Intimate Partner Violence: Not At Risk (03/28/2023)   Humiliation, Afraid, Rape, and Kick questionnaire    Fear of Current or Ex-Partner: No    Emotionally Abused: No    Physically Abused: No    Sexually Abused: No    Family History  Problem Relation Age of Onset   Breast cancer Paternal Aunt      Vitals:   04/04/23 1935 04/04/23 2351 04/05/23 0408 04/05/23 0742  BP: 129/85  124/63 120/75  Pulse: (!) 108   69  Resp: 20  20 17   Temp: 98.9 F (37.2 C) 98.2 F (36.8 C) 98 F (36.7 C) 97.8 F (36.6 C)  TempSrc: Oral Oral Oral   SpO2: 94%  92% 93%  Weight:      Height:        PHYSICAL EXAM General: Chronically ill appearing elderly female, well nourished, in no acute distress. HEENT: Normocephalic and atraumatic. Neck: No JVD.  Lungs: Normal respiratory effort on 2L [place]. Clear bilaterally to auscultation. No  wheezes, crackles, rhonchi.  Heart: Irregularly irregular, controlled rate. Normal S1 and S2 without gallops or murmurs.  Abdomen: Non-distended appearing.  Msk: Normal strength and tone for age. Extremities: Warm and well perfused. No clubbing, cyanosis. No edema.  Neuro: Alert and oriented X 3. Psych: Answers questions appropriately.   Labs: Basic Metabolic Panel: Recent Labs    04/03/23 0441 04/04/23 0517 04/04/23 2155  NA 131* 136  --   K 3.9 3.8  --   CL 97* 98  --   CO2 26 29  --   GLUCOSE 272* 162* 391*  BUN 20 16  --   CREATININE 0.79 0.73  --   CALCIUM 8.9 9.2  --   MG 1.8  --   --    Liver Function Tests: No results for input(s): "AST", "ALT", "ALKPHOS", "BILITOT", "PROT", "ALBUMIN" in the last 72 hours.  No results for input(s): "LIPASE", "AMYLASE" in the last 72 hours. CBC: Recent Labs    04/04/23 0517 04/05/23 0616  WBC 8.3 8.6  HGB 13.3 13.0  HCT 39.9 39.1  MCV 88.3 90.1  PLT 264 291   Cardiac Enzymes: No results for input(s): "CKTOTAL", "CKMB", "CKMBINDEX", "TROPONINIHS" in the last 72 hours.  BNP: No results for input(s): "BNP" in the last 72 hours.  D-Dimer: No results for input(s): "DDIMER" in the last 72 hours. Hemoglobin A1C: No results for input(s): "HGBA1C" in the last 72 hours. Fasting Lipid Panel: No results for input(s): "CHOL", "HDL", "LDLCALC", "TRIG", "CHOLHDL", "LDLDIRECT" in the last 72 hours. Thyroid Function Tests: No results for input(s): "TSH", "T4TOTAL", "T3FREE", "THYROIDAB" in the last 72 hours.  Invalid input(s): "FREET3" Anemia Panel: No results for input(s): "VITAMINB12", "FOLATE", "FERRITIN", "TIBC", "IRON", "RETICCTPCT" in the last 72 hours.   Radiology: PERIPHERAL VASCULAR CATHETERIZATION Result Date: 04/02/2023 See surgical note  for result.  CT Angio Chest PE W and/or Wo Contrast Result Date: 03/28/2023 CLINICAL DATA:  Shortness of breath EXAM: CT ANGIOGRAPHY CHEST WITH CONTRAST TECHNIQUE: Multidetector CT  imaging of the chest was performed using the standard protocol during bolus administration of intravenous contrast. Multiplanar CT image reconstructions and MIPs were obtained to evaluate the vascular anatomy. RADIATION DOSE REDUCTION: This exam was performed according to the departmental dose-optimization program which includes automated exposure control, adjustment of the mA and/or kV according to patient size and/or use of iterative reconstruction technique. CONTRAST:  75mL OMNIPAQUE IOHEXOL 350 MG/ML SOLN COMPARISON:  CT angiogram 02/25/2023.  X-ray 03/28/2023 and older. FINDINGS: Cardiovascular: Heart is enlarged. Trace pericardial fluid. Coronary artery calcifications are seen. Minimal contrast opacification along the thoracic aorta. Grossly normal course and caliber with vascular calcifications. There is some dilatation of the main pulmonary artery. Please correlate for pulmonary artery hypertension. There is a pulmonary embolism identified in the upper aspect of the middle lobe as seen on series 6, image 213. This has not seen on the previous examination. There is also embolus along the lingula on series 6, image 180, not seen previously. Overall mild clot burden. No larger or more central embolus. Some limited evaluation related to motion. Mediastinum/Nodes: Patulous esophagus. Heterogeneous thyroid gland. There are several prominent nodes identified in the mediastinum and hilum bilaterally, similar to previous. No axillary nodes. Lungs/Pleura: Trace pleural fluid with the adjacent parenchymal opacities. The opacities are increasing from previous at the lower lobes. There are also some dependent areas in the upper lobes. There is some new nodules identified as well in the left upper lobe measuring 10 mm on series 5, image 38. With the time course favor a infectious or inflammatory process as this has not seen previously. There are areas of bronchial wall thickening, mucous plugging and interstitial septal  thickening. Few areas of ground-glass as well. Upper Abdomen: The adrenal glands are preserved in the upper abdomen. Stomach is relatively collapsed punctate nonobstructing right-sided renal stone at the edge of the imaging field. Musculoskeletal: Surgical changes about the left shoulder. Scattered degenerative changes along the spine. Review of the MIP images confirms the above findings. Critical Value/emergent results were called by telephone at the time of interpretation on 03/28/2023 at 4:21 pm EST to provider Dr. Alvester Morin, who verbally acknowledged these results. IMPRESSION: New bilateral few segmental and smaller pulmonary emboli. Mild clot burden. No larger or more central embolus. There is some enlargement of the main pulmonary arteries. Please correlate for pulmonary artery hypertension. This has seen previously. Enlarged heart. Increasing parenchymal bilateral lung opacities identified with some subtle nodularity, ground-glass and interstitial septal thickening. Few presumed reactive nodes. Recommend follow up to confirm resolution. Persistent tiny pleural effusions, left-greater-than-right. Aortic Atherosclerosis (ICD10-I70.0). Electronically Signed   By: Karen Kays M.D.   On: 03/28/2023 16:42   CT HEAD WO CONTRAST ( ) Result Date: 03/28/2023 CLINICAL DATA:  Mental status change, unknown cause. EXAM: CT HEAD WITHOUT CONTRAST TECHNIQUE: Contiguous axial images were obtained from the base of the skull through the vertex without intravenous contrast. RADIATION DOSE REDUCTION: This exam was performed according to the departmental dose-optimization program which includes automated exposure control, adjustment of the mA and/or kV according to patient size and/or use of iterative reconstruction technique. COMPARISON:  None Available. FINDINGS: Brain: There is no evidence of an acute infarct, intracranial hemorrhage, mass, midline shift, or extra-axial fluid collection. Mild cerebral atrophy is within normal  limits for age. Hypodensities in the cerebral white matter  nonspecific but compatible with mild chronic small vessel ischemic disease. Vascular: Calcified atherosclerosis at the skull base. No hyperdense vessel. Skull: No acute fracture or suspicious osseous lesion. Sinuses/Orbits: Chronic left sphenoid sinusitis with near complete opacification. Mild mucosal thickening in the paranasal sinuses elsewhere. Clear mastoid air cells. Bilateral cataract extraction. Other: None. IMPRESSION: 1. No evidence of acute intracranial abnormality. 2. Mild chronic small vessel ischemic disease. Electronically Signed   By: Sebastian Ache M.D.   On: 03/28/2023 15:52   DG Chest Portable 1 View Result Date: 03/28/2023 CLINICAL DATA:  Shortness of breath EXAM: PORTABLE CHEST - 1 VIEW COMPARISON:  02/25/2023 FINDINGS: Unchanged borderline cardiomegaly. No significant pulmonary vascular congestion. Unchanged mild left basilar opacity favored to be atelectasis. Reversed left total shoulder prosthesis again seen. IMPRESSION: 1. Unchanged mild cardiomegaly. 2. Unchanged left basilar opacity likely due to atelectasis. Electronically Signed   By: Acquanetta Belling M.D.   On: 03/28/2023 11:42    ECHO 01/2023: 1. Left ventricular ejection fraction, by estimation, is 60 to 65%. The left ventricle has normal function. The left ventricle has no regional wall motion abnormalities. There is moderate left ventricular hypertrophy. Indeterminate diastolic filling due  to E-A fusion.   2. Right ventricular systolic function is normal. The right ventricular size is normal.   3. Left atrial size was moderately dilated.   4. Right atrial size was mildly dilated.   5. The mitral valve is normal in structure. Mild mitral valve  regurgitation. No evidence of mitral stenosis.   6. The aortic valve is normal in structure. Aortic valve regurgitation is  not visualized. Mild aortic valve stenosis.   7. The inferior vena cava is normal in size with greater  than 50%  respiratory variability, suggesting right atrial pressure of 3 mmHg.   TELEMETRY reviewed by me 04/05/2023: atrial fibrillation rate 90s  EKG reviewed by me: atrial fibrillation RVR rate 118 bom  Data reviewed by me 04/05/2023: last 24h vitals tele labs imaging I/O hospitalist progress note, ENT notes, vascular surgery notes  Principal Problem:   Acute respiratory failure with hypoxia (HCC) Active Problems:   Chronic kidney disease, stage 3a (HCC)   DM (diabetes mellitus) (HCC)   HTN (hypertension)   Acute hypoxic respiratory failure (HCC)   Atrial fibrillation with RVR (HCC)   GERD (gastroesophageal reflux disease)   Acute heart failure with preserved ejection fraction (HFpEF) (HCC)   Encephalopathy   Epistaxis    ASSESSMENT AND PLAN:  Samantha Freeman is a 88 y.o. female  with a past medical history of  mild aortic stenosis, bradycardia, type 2 diabetes, hypercholesterolemia  who presented to the ED on 03/28/2023 for shortness of breath, fatigue. Cardiology was consulted for further evaluation.   # Acute hypoxic respiratory failure # Acute on chronic HFpEF # Acute pulmonary emboli Patient with worsening SOB and fatigue over the last few days, brought to ED yesterday. Initially required BiPAP due to hypoxia now on 8L HFNC. BNP elevated at 661. Troponins negative x2 at 9 > 9. CXR without significant pulmonary edema. CTA chest with few bilateral segmental and small PE, mild clot burden. -Continue eliquis 10 mg twice daily then transition to 5 mg twice daily. -Continue lasix 20 mg daily.  -Further management of PE per primary.   # Persistent atrial fibrillation # Atrial fibrillation with rapid ventricular response Patient with hx of AF, multiple recent hospitalizations with difficult to control rate. Most recently was placed on metoprolol, diltiazem, and digoxin for rate control. Dig level  reported at 2.6 but this was corrected to 0.2 per lab.  -Continue consolidated dosing  of Diltiazem 360 mg once daily this AM. Continue metoprolol 50 mg twice daily, digoxin 0.125 mg every other day.  -See plan for eliquis above. -If she is amenable to discharging home on anticoagulation then could consider DCCV or antiarrhythmic medication in future.  # Hyperlipidemia -Continue rosuvastatin 10 mg daily and aspirin 81 mg daily.    This patient's plan of care was discussed and created with Dr. Juliann Pares and he is in agreement.  Signed: Gale Journey, PA-C  04/05/2023, 10:34 AM [facility] Card[facility]

## 2023-04-06 DIAGNOSIS — I482 Chronic atrial fibrillation, unspecified: Secondary | ICD-10-CM | POA: Diagnosis not present

## 2023-04-06 DIAGNOSIS — J9601 Acute respiratory failure with hypoxia: Secondary | ICD-10-CM | POA: Diagnosis not present

## 2023-04-06 LAB — CBC
HCT: 38.5 % (ref 36.0–46.0)
Hemoglobin: 12.8 g/dL (ref 12.0–15.0)
MCH: 29.2 pg (ref 26.0–34.0)
MCHC: 33.2 g/dL (ref 30.0–36.0)
MCV: 87.9 fL (ref 80.0–100.0)
Platelets: 292 10*3/uL (ref 150–400)
RBC: 4.38 MIL/uL (ref 3.87–5.11)
RDW: 14.6 % (ref 11.5–15.5)
WBC: 8.8 10*3/uL (ref 4.0–10.5)
nRBC: 0 % (ref 0.0–0.2)

## 2023-04-06 LAB — GLUCOSE, CAPILLARY
Glucose-Capillary: 212 mg/dL — ABNORMAL HIGH (ref 70–99)
Glucose-Capillary: 304 mg/dL — ABNORMAL HIGH (ref 70–99)
Glucose-Capillary: 289 mg/dL — ABNORMAL HIGH (ref 70–99)
Glucose-Capillary: 293 mg/dL — ABNORMAL HIGH (ref 70–99)

## 2023-04-06 LAB — BASIC METABOLIC PANEL
Anion gap: 11 (ref 5–15)
BUN: 19 mg/dL (ref 8–23)
CO2: 26 mmol/L (ref 22–32)
Calcium: 9.2 mg/dL (ref 8.9–10.3)
Chloride: 98 mmol/L (ref 98–111)
Creatinine, Ser: 0.87 mg/dL (ref 0.44–1.00)
GFR, Estimated: >60 mL/min (ref >60)
Glucose, Bld: 220 mg/dL — ABNORMAL HIGH (ref 70–99)
Potassium: 3.7 mmol/L (ref 3.5–5.1)
Sodium: 135 mmol/L (ref 135–145)

## 2023-04-06 NOTE — Progress Notes (Signed)
Physical Therapy Treatment Patient Details Name: Samantha Freeman MRN: [id number] DOB: 1935/10/12 Today's Date: 04/06/2023   History of Present Illness Pt is a 87 y.o. female admitted with AFIB with RVR, acute hypoxic respiratory failure, acute heart failure, & encephalopathy. PMH significant for atrial fibrillation not on AC, HFpEF, type 2 diabetes, hypertension, hyperlipidemia, CKD stage IIIa    PT Comments  Pt was sitting in recliner upon arrival. She is alert and O and agreeable to session. Pt was on rm air throughout session with sao2 > 94%. HR was in A-fib with peak at 147 bpm during ambulation. She demonstrates safe abilities to stand and ambulate with RW without difficulty or safety concern. Encouraged pt to use her personal rollator at DC. Per pt, "my son can help me when I get home."  MD made aware of change in DC recs. Acute PT will continue to follow and progress per current POC.     If plan is discharge home, recommend the following: A little help with walking and/or transfers;A little help with bathing/dressing/bathroom;Assistance with cooking/housework;Assist for transportation     Equipment Recommendations  None recommended by PT       Precautions / Restrictions Precautions Precautions: Fall Precaution Comments: watch HR and O2 Restrictions Weight Bearing Restrictions Per Provider Order: No     Mobility  Bed Mobility  General bed mobility comments: pt was sitting in recliner pre/post session    Transfers Overall transfer level: Needs assistance Equipment used: Rolling walker (2 wheels) Transfers: Sit to/from Stand Sit to Stand: Supervision    General transfer comment: no physical assistance required to stand form recliner. fair eccentric control. was cued to sit with more controll    Ambulation/Gait Ambulation/Gait assistance: Supervision Gait Distance (Feet): 120 Feet Assistive device: Rolling walker (2 wheels) Gait Pattern/deviations: Step-through pattern,  Decreased stride length Gait velocity: decreased  General Gait Details: Pt was on rm air throughout session with sao2 > 92%. HR peaked in 140s during ambulation but quickly resolved with seated rest. pt was in A fib. She endorses no symptoms of distress or lightheadedness    Balance Overall balance assessment: Needs assistance Sitting-balance support: Feet supported Sitting balance-Leahy Scale: Normal     Standing balance support: Bilateral upper extremity supported, During functional activity Standing balance-Leahy Scale: Good Standing balance comment: encouraged pt to use her personal rollator at DC       Cognition Arousal: Alert Behavior During Therapy: [facility] for tasks assessed/performed Overall Cognitive Status: Within Functional Limits for tasks assessed      General Comments: Pt was is A and O x 3               Pertinent Vitals/Pain Pain Assessment Pain Assessment: No/denies pain Pain Score: 0-No pain     PT Goals (current goals can now be found in the care plan section) Acute Rehab PT Goals Patient Stated Goal: get better and go home Progress towards PT goals: Progressing toward goals    Frequency    Min 1X/week       Co-evaluation     PT goals addressed during session: Mobility/safety with mobility;Balance;Proper use of DME;Strengthening/ROM        AM-PAC PT "6 Clicks" Mobility   Outcome Measure  Help needed turning from your back to your side while in a flat bed without using bedrails?: A Little Help needed moving from lying on your back to sitting on the side of a flat bed without using bedrails?: A Little Help needed moving to and  from a bed to a chair (including a wheelchair)?: A Little Help needed standing up from a chair using your arms (e.g., wheelchair or bedside chair)?: A Little Help needed to walk in hospital room?: A Little Help needed climbing 3-5 steps with a railing? : A Little 6 Click Score: 18    End of Session   Activity  Tolerance: Patient tolerated treatment well Patient left: in chair;with call bell/phone within reach Nurse Communication: Mobility status PT Visit Diagnosis: Muscle weakness (generalized) (M62.81);Difficulty in walking, not elsewhere classified (R26.2)     Time: 9147-8295 PT Time Calculation (min) (ACUTE ONLY): 12 min  Charges:    $Gait Training: 8-22 mins PT General Charges $$ ACUTE PT VISIT: 1 Visit                     Jetta Lout PTA 04/06/23, 3:17 PM

## 2023-04-06 NOTE — Progress Notes (Signed)
PROGRESS NOTE   HPI was taken from Dr. Alvester Morin:  Samantha Freeman is a 87 y.o. female with medical history significant of atrial fibrillation not on AC, HFpEF, type 2 diabetes, hypertension, hyperlipidemia, CKD stage IIIa, depression/anxiety presented acute respiratory failure hypoxia, A-fib with RVR, acute on chronic HFpEF.  Patient reports increased work of breathing over the past 1 to 2 days.  Positive orthopnea and PND.  Mild palpitations.  No chest pain.  No abdominal pain.?  Diarrhea.  No focal hemiparesis or confusion.  Noted to have been admitted for similar issues November 2024.  Was formally evaluated by cardiology.  Discharged on diltiazem metoprolol as well as digoxin.  Patient denies any missed doses of medication.  No reported high salt intake.  Baseline type 2 diabetes.  His report of sugars dropping to the 70s at times.  No reported caffeine use. Presented to the ER afebrile, heart rate into the 120s.  BP stable.  Initially on BiPAP, transition to high flow nasal cannula to keep O2 sats greater than 93%.  White count 11.3, hemoglobin 14.6, platelets 210, troponin within norm limits.  VBG stable.  Creatinine 1.3 with GFR in the 40s, glucose 166.  COVID flu and RSV negative.  Chest x-ray with cardiomegaly.?  Left basilar opacity.  CT head and CTA of the chest pending.   Samantha Freeman  ZOX:[id number] DOB: 29-Mar-1936 DOA: 03/28/2023 PCP: Marguarite Arbour, MD    Assessment & Plan:   Principal Problem:   Acute respiratory failure with hypoxia (HCC) Active Problems:   Atrial fibrillation with RVR (HCC)   Acute hypoxic respiratory failure (HCC)   Acute heart failure with preserved ejection fraction (HFpEF) (HCC)   Chronic kidney disease, stage 3a (HCC)   DM (diabetes mellitus) (HCC)   HTN (hypertension)   GERD (gastroesophageal reflux disease)   Encephalopathy   Epistaxis  Assessment and Plan: Persistent a. fib: continue on eliquis, metoprolol, diltiazem, & digoxin as per cardio.  D/c IV heparin. Cardio following and recs apprec   Recurrent epistaxis: s/p left maxillary artery embolization as per vasc surg. Likely secondary to IV heparin. Hx of nose bleeds. Continue w/ nasal spray. Weaned off of supplemental oxygen. Will f/u outpatient w/ vasc surg in 1 month    Acute hypoxic respiratory failure: likely secondary to acute PE & acute on chronic diastolic CHF. Weaned off of supplemental oxygen.   Acute PE: continue on eliquis. IV heparin was d/c.   Acute on chronic diastolic CHF: echo w/ EF 60-65%. 1=2+ pitting edema. Continue on lasix as per cardio. Monitor I/Os    Encephalopathy: w/ transient episode of a hallucination. CT head shows no acute intracranial abnormalities. Resolved    GERD: continue on PPI    HTN: continue on BB, CCB   DM2: fair control, HbA1c 7.6. Continue on glargine, SSI w/ accuchecks    CKDIIIa: Cr is better than baseline currently        DVT prophylaxis: eliquis  Code Status: DNR  Family Communication: discussed pt's care w/ pt's son at bedside and answered his questions  Disposition Plan: PT now recs HH   Level of care: Progressive  Status is: Inpatient Remains inpatient appropriate because: can when cardio clears pt for d/c. Pt may need assistance w/ getting eliquis     Consultants:  Cardio   Procedures:  Antimicrobials:   Subjective: Pt c/o fatigue.   Objective: Vitals:   04/05/23 2355 04/06/23 0352 04/06/23 0735 04/06/23 1001  BP:  124/71 133/64   Pulse: Marland Kitchen)  113 64 73   Resp: 16  16   Temp:  97.7 F (36.5 C) 97.6 F (36.4 C)   TempSrc:      SpO2: 95%  96% 95%  Weight:      Height:        Intake/Output Summary (Last 24 hours) at 04/06/2023 1103 Last data filed at 04/06/2023 1006 Gross per 24 hour  Intake 460 ml  Output 1000 ml  Net -540 ml   Filed Weights   03/28/23 1029 03/28/23 2145  Weight: 86.1 kg 81 kg    Examination:  General exam: appears comfortable  Respiratory system: diminished breath  sounds b/l  Cardiovascular system: irregularly irregular  Gastrointestinal system: abd is soft, NT, ND & normal bowel sounds  Central nervous system: alert & oriented. Moves all extremities  Psychiatry: judgement and insight appears at normal. Appropriate mood and affect    Data Reviewed: I have personally reviewed following labs and imaging studies  CBC: Recent Labs  Lab 04/02/23 0409 04/03/23 0441 04/04/23 0517 04/05/23 0616 04/06/23 0503  WBC 7.0 8.8 8.3 8.6 8.8  HGB 12.7 13.2 13.3 13.0 12.8  HCT 38.2 39.8 39.9 39.1 38.5  MCV 90.3 90.2 88.3 90.1 87.9  PLT 235 256 264 291 292   Basic Metabolic Panel: Recent Labs  Lab 03/31/23 0622 04/01/23 0604 04/02/23 0409 04/03/23 0441 04/04/23 0517 04/04/23 2155 04/06/23 0503  NA 132* 132* 132* 131* 136  --  135  K 3.8 4.0 3.9 3.9 3.8  --  3.7  CL 94* 98 97* 97* 98  --  98  CO2 26 24 27 26 29   --  26  GLUCOSE 263* 296* 286* 272* 162* 391* 220*  BUN 26* 25* 23 20 16   --  19  CREATININE 0.98 0.91 0.88 0.79 0.73  --  0.87  CALCIUM 9.3 9.1 9.1 8.9 9.2  --  9.2  MG 1.8 1.6* 1.4* 1.8  --   --   --    GFR: Estimated Creatinine Clearance: 48.7 mL/min (by C-G formula based on SCr of 0.87 mg/dL). Liver Function Tests: No results for input(s): "AST", "ALT", "ALKPHOS", "BILITOT", "PROT", "ALBUMIN" in the last 168 hours.  No results for input(s): "LIPASE", "AMYLASE" in the last 168 hours. No results for input(s): "AMMONIA" in the last 168 hours. Coagulation Profile: No results for input(s): "INR", "PROTIME" in the last 168 hours.  Cardiac Enzymes: No results for input(s): "CKTOTAL", "CKMB", "CKMBINDEX", "TROPONINI" in the last 168 hours. BNP (last 3 results) No results for input(s): "PROBNP" in the last 8760 hours. HbA1C: No results for input(s): "HGBA1C" in the last 72 hours. CBG: Recent Labs  Lab 04/05/23 0744 04/05/23 1218 04/05/23 1556 04/05/23 2109 04/06/23 0737  GLUCAP 154* 323* 286* 315* 212*   Lipid Profile: No  results for input(s): "CHOL", "HDL", "LDLCALC", "TRIG", "CHOLHDL", "LDLDIRECT" in the last 72 hours. Thyroid Function Tests: No results for input(s): "TSH", "T4TOTAL", "FREET4", "T3FREE", "THYROIDAB" in the last 72 hours. Anemia Panel: No results for input(s): "VITAMINB12", "FOLATE", "FERRITIN", "TIBC", "IRON", "RETICCTPCT" in the last 72 hours. Sepsis Labs: No results for input(s): "PROCALCITON", "LATICACIDVEN" in the last 168 hours.   Recent Results (from the past 240 hours)  SARS Coronavirus 2 by RT PCR (hospital order, performed in [facility] hospital lab) *cepheid single result test* Anterior Nasal Swab     Status: None   Collection Time: 03/28/23 10:38 AM   Specimen: Anterior Nasal Swab  Result Value Ref Range Status   SARS Coronavirus  2 by RT PCR NEGATIVE NEGATIVE Final    Comment: (NOTE) SARS-CoV-2 target nucleic acids are NOT DETECTED.  The SARS-CoV-2 RNA is generally detectable in upper and lower respiratory specimens during the acute phase of infection. The lowest concentration of SARS-CoV-2 viral copies this assay can detect is 250 copies / mL. A negative result does not preclude SARS-CoV-2 infection and should not be used as the sole basis for treatment or other patient management decisions.  A negative result may occur with improper specimen collection / handling, submission of specimen other than nasopharyngeal swab, presence of viral mutation(s) within the areas targeted by this assay, and inadequate number of viral copies (<250 copies / mL). A negative result must be combined with clinical observations, patient history, and epidemiological information.  Fact Sheet for Patients:   RoadLapTop.co.za  Fact Sheet for Healthcare Providers: http://kim-miller.com/  This test is not yet approved or  cleared by the [place] FDA and has been authorized for detection and/or diagnosis of SARS-CoV-2 by FDA under an Emergency  Use Authorization (EUA).  This EUA will remain in effect (meaning this test can be used) for the duration of the COVID-19 declaration under Section 564(b)(1) of the Act, 21 U.S.C. section 360bbb-3(b)(1), unless the authorization is terminated or revoked sooner.  Performed at [facility], [address] [place], [place] [address]   MRSA Next Gen by PCR, Nasal     Status: None   Collection Time: 03/28/23  9:59 PM   Specimen: Nasal Mucosa; Nasal Swab  Result Value Ref Range Status   MRSA by PCR Next Gen NOT DETECTED NOT DETECTED Final    Comment: (NOTE) The GeneXpert MRSA Assay (FDA approved for NASAL specimens only), is one component of a comprehensive MRSA colonization surveillance program. It is not intended to diagnose MRSA infection nor to guide or monitor treatment for MRSA infections. Test performance is not FDA approved in patients less than 81 years old. Performed at [facility], [address] [place], [place] [address]          Radiology Studies: No results found.       Scheduled Meds:  apixaban  10 mg Oral BID   Followed by   Melene Muller ON 04/11/2023] apixaban  5 mg Oral BID   aspirin EC  81 mg Oral Daily   digoxin  0.125 mg Oral QODAY   diltiazem  360 mg Oral Daily   docusate sodium  200 mg Oral BID   feeding supplement (GLUCERNA SHAKE)  237 mL Oral TID BM   furosemide  20 mg Oral Daily   gabapentin  200 mg Oral TID   insulin aspart  0-15 Units Subcutaneous TID WC   insulin aspart  10 Units Subcutaneous TID WC   insulin glargine-yfgn  30 Units Subcutaneous QHS   metoprolol tartrate  50 mg Oral BID   mupirocin ointment   Nasal BID   pantoprazole  40 mg Oral Daily   PARoxetine  10 mg Oral Daily   prednisoLONE acetate  1 drop Both Eyes QHS   rOPINIRole  1 mg Oral QHS   rosuvastatin  10 mg Oral Daily   sodium chloride  2 spray Each Nare QID   Continuous Infusions:     LOS: 9 days       Charise Killian, MD [facility] Pager [phone]  If 7PM-7AM, please contact night-coverage [email or web address] 04/06/2023, 11:03 AM

## 2023-04-06 NOTE — Progress Notes (Signed)
Patient ID: Samantha Freeman, female   DOB: 1935/09/19, 87 y.o.   MRN: [id number] [facility]    SUBJECTIVE: Patient sitting up in bed and states to be doing reasonably well improved shortness of breath resting comfortably no palpitations no tachycardia   Vitals:   04/06/23 0352 04/06/23 0735 04/06/23 1001 04/06/23 1229  BP: 124/71 133/64  136/86  Pulse: 64 73  64  Resp:  16  16  Temp: 97.7 F (36.5 C) 97.6 F (36.4 C)  98.1 F (36.7 C)  TempSrc:      SpO2:  96% 95% 91%  Weight:      Height:         Intake/Output Summary (Last 24 hours) at 04/06/2023 1553 Last data filed at 04/06/2023 1006 Gross per 24 hour  Intake 360 ml  Output 1000 ml  Net -640 ml      PHYSICAL EXAM  General: Well developed, well nourished, in no acute distress HEENT:  Normocephalic and atramatic Neck:  No JVD.  Lungs: Clear bilaterally to auscultation and percussion. Heart: Irregular irregular normal S1 and S2 without gallops or 2/6 sem murmurs.  Abdomen: Bowel sounds are positive, abdomen soft and non-tender  Msk:  Back normal, normal gait. Normal strength and tone for age. Extremities: No clubbing, cyanosis or edema.   Neuro: Alert and oriented X 3. Psych:  Good affect, responds appropriately   LABS: Basic Metabolic Panel: Recent Labs    04/04/23 0517 04/04/23 2155 04/06/23 0503  NA 136  --  135  K 3.8  --  3.7  CL 98  --  98  CO2 29  --  26  GLUCOSE 162* 391* 220*  BUN 16  --  19  CREATININE 0.73  --  0.87  CALCIUM 9.2  --  9.2   Liver Function Tests: No results for input(s): "AST", "ALT", "ALKPHOS", "BILITOT", "PROT", "ALBUMIN" in the last 72 hours. No results for input(s): "LIPASE", "AMYLASE" in the last 72 hours. CBC: Recent Labs    04/05/23 0616 04/06/23 0503  WBC 8.6 8.8  HGB 13.0 12.8  HCT 39.1 38.5  MCV 90.1 87.9  PLT 291 292   Cardiac Enzymes: No results for input(s): "CKTOTAL", "CKMB", "CKMBINDEX", "TROPONINI" in the last 72 hours. BNP: Invalid input(s):  "POCBNP" D-Dimer: No results for input(s): "DDIMER" in the last 72 hours. Hemoglobin A1C: No results for input(s): "HGBA1C" in the last 72 hours. Fasting Lipid Panel: No results for input(s): "CHOL", "HDL", "LDLCALC", "TRIG", "CHOLHDL", "LDLDIRECT" in the last 72 hours. Thyroid Function Tests: No results for input(s): "TSH", "T4TOTAL", "T3FREE", "THYROIDAB" in the last 72 hours.  Invalid input(s): "FREET3" Anemia Panel: No results for input(s): "VITAMINB12", "FOLATE", "FERRITIN", "TIBC", "IRON", "RETICCTPCT" in the last 72 hours.  No results found.   Echo normal left ventricular function EF of 60%  TELEMETRY: Atrial fibrillation rate controlled rate of 70 nonspecific ST-T wave changes  ASSESSMENT AND PLAN:  Principal Problem:   Acute respiratory failure with hypoxia (HCC) Active Problems:   Chronic kidney disease, stage 3a (HCC)   DM (diabetes mellitus) (HCC)   HTN (hypertension)   Acute hypoxic respiratory failure (HCC)   Atrial fibrillation with RVR (HCC)   GERD (gastroesophageal reflux disease)   Acute heart failure with preserved ejection fraction (HFpEF) (HCC)   Encephalopathy   Epistaxis    Plan Significant shortness of breath dyspnea respiratory failure congestive heart failure COPD continue aggressive therapy with inhalers diuretics Acute pulmonary emboli continue Eliquis therapy for anticoagulation currently undergoing load  Acute on chronic diastolic congestive heart failure continue current therapy Elevated BNP suggestive of heart failure continue Lasix therapy Atrial fibrillation rapid ventricular response continue rate management and control as well as Eliquis anticoagulation Consider electrical cardioversion after medically anticoagulated Agree with metoprolol diltiazem for rate control continue low-dose dig Hyperlipidemia management with statin therapy with Crestor Nosebleed status post ENT treatment with ablation stable continue current therapy GERD  continue PPI therapy for reflux type symptoms  Alwyn Pea, MD, 04/06/2023 3:53 PM

## 2023-04-07 DIAGNOSIS — R0789 Other chest pain: Secondary | ICD-10-CM

## 2023-04-07 DIAGNOSIS — J9601 Acute respiratory failure with hypoxia: Secondary | ICD-10-CM | POA: Diagnosis not present

## 2023-04-07 DIAGNOSIS — K219 Gastro-esophageal reflux disease without esophagitis: Secondary | ICD-10-CM

## 2023-04-07 DIAGNOSIS — N189 Chronic kidney disease, unspecified: Secondary | ICD-10-CM

## 2023-04-07 DIAGNOSIS — I4891 Unspecified atrial fibrillation: Secondary | ICD-10-CM | POA: Diagnosis not present

## 2023-04-07 DIAGNOSIS — R040 Epistaxis: Secondary | ICD-10-CM | POA: Diagnosis not present

## 2023-04-07 DIAGNOSIS — Z794 Long term (current) use of insulin: Secondary | ICD-10-CM

## 2023-04-07 DIAGNOSIS — I2699 Other pulmonary embolism without acute cor pulmonale: Principal | ICD-10-CM

## 2023-04-07 DIAGNOSIS — G9341 Metabolic encephalopathy: Secondary | ICD-10-CM

## 2023-04-07 DIAGNOSIS — R079 Chest pain, unspecified: Secondary | ICD-10-CM | POA: Insufficient documentation

## 2023-04-07 DIAGNOSIS — I5031 Acute diastolic (congestive) heart failure: Secondary | ICD-10-CM

## 2023-04-07 DIAGNOSIS — E1165 Type 2 diabetes mellitus with hyperglycemia: Secondary | ICD-10-CM

## 2023-04-07 DIAGNOSIS — N179 Acute kidney failure, unspecified: Secondary | ICD-10-CM

## 2023-04-07 LAB — CBC
HCT: 40.6 % (ref 36.0–46.0)
Hemoglobin: 13.6 g/dL (ref 12.0–15.0)
MCH: 30.3 pg (ref 26.0–34.0)
MCHC: 33.5 g/dL (ref 30.0–36.0)
MCV: 90.4 fL (ref 80.0–100.0)
Platelets: 309 10*3/uL (ref 150–400)
RBC: 4.49 MIL/uL (ref 3.87–5.11)
RDW: 14.6 % (ref 11.5–15.5)
WBC: 8.6 10*3/uL (ref 4.0–10.5)
nRBC: 0 % (ref 0.0–0.2)

## 2023-04-07 LAB — BASIC METABOLIC PANEL
Anion gap: 9 (ref 5–15)
BUN: 20 mg/dL (ref 8–23)
CO2: 27 mmol/L (ref 22–32)
Calcium: 9.3 mg/dL (ref 8.9–10.3)
Chloride: 96 mmol/L — ABNORMAL LOW (ref 98–111)
Creatinine, Ser: 0.82 mg/dL (ref 0.44–1.00)
GFR, Estimated: >60 mL/min (ref >60)
Glucose, Bld: 255 mg/dL — ABNORMAL HIGH (ref 70–99)
Potassium: 4.2 mmol/L (ref 3.5–5.1)
Sodium: 132 mmol/L — ABNORMAL LOW (ref 135–145)

## 2023-04-07 LAB — GLUCOSE, CAPILLARY
Glucose-Capillary: 285 mg/dL — ABNORMAL HIGH (ref 70–99)
Glucose-Capillary: 368 mg/dL — ABNORMAL HIGH (ref 70–99)
Glucose-Capillary: 306 mg/dL — ABNORMAL HIGH (ref 70–99)
Glucose-Capillary: 177 mg/dL — ABNORMAL HIGH (ref 70–99)

## 2023-04-07 LAB — TROPONIN I (HIGH SENSITIVITY)
Troponin I (High Sensitivity): 6 ng/L (ref <18)
Troponin I (High Sensitivity): 5 ng/L (ref <18)

## 2023-04-07 MED ORDER — NITROGLYCERIN 0.4 MG SL SUBL: 0.4000 mg | SUBLINGUAL_TABLET | SUBLINGUAL | Status: DC | PRN

## 2023-04-07 NOTE — Assessment & Plan Note (Signed)
Likely noncardiac with cardiac enzymes negative.

## 2023-04-07 NOTE — Assessment & Plan Note (Signed)
AKI on CKD stage IIIa.  Creatinine 1.29 on presentation and now down to 0.82.

## 2023-04-07 NOTE — Assessment & Plan Note (Signed)
Status post left maxillary artery embolization by vascular surgery.

## 2023-04-07 NOTE — Progress Notes (Signed)
Patient ID: Samantha Freeman, female   DOB: Jan 09, 1936, 87 y.o.   MRN: [id number] [facility]    SUBJECTIVE: Patient states he feels somewhat better less shortness of breath less dyspnea denies palpitations or tachycardia ambulating in the hall with assistance feels much improved   Vitals:   04/07/23 0813 04/07/23 1219 04/07/23 1635 04/07/23 2009  BP: 130/74 118/67 121/66 134/66  Pulse: 63 71 (!) 54 67  Resp:    20  Temp: 97.7 F (36.5 C) 97.6 F (36.4 C) 98.1 F (36.7 C) 98.3 F (36.8 C)  TempSrc:    Oral  SpO2: 94% 95% 94% 93%  Weight:      Height:         Intake/Output Summary (Last 24 hours) at 04/07/2023 2233 Last data filed at 04/07/2023 2139 Gross per 24 hour  Intake 240 ml  Output 1050 ml  Net -810 ml      PHYSICAL EXAM  General: Well developed, well nourished, in no acute distress HEENT:  Normocephalic and atramatic Neck:  No JVD.  Lungs: Clear bilaterally to auscultation and percussion. Heart: HRRR . Normal S1 and S2 without gallops or murmurs.  Abdomen: Bowel sounds are positive, abdomen soft and non-tender  Msk:  Back normal, normal gait. Normal strength and tone for age. Extremities: No clubbing, cyanosis or edema.   Neuro: Alert and oriented X 3. Psych:  Good affect, responds appropriately   LABS: Basic Metabolic Panel: Recent Labs    04/06/23 0503 04/07/23 0556  NA 135 132*  K 3.7 4.2  CL 98 96*  CO2 26 27  GLUCOSE 220* 255*  BUN 19 20  CREATININE 0.87 0.82  CALCIUM 9.2 9.3   Liver Function Tests: No results for input(s): "AST", "ALT", "ALKPHOS", "BILITOT", "PROT", "ALBUMIN" in the last 72 hours. No results for input(s): "LIPASE", "AMYLASE" in the last 72 hours. CBC: Recent Labs    04/06/23 0503 04/07/23 0556  WBC 8.8 8.6  HGB 12.8 13.6  HCT 38.5 40.6  MCV 87.9 90.4  PLT 292 309   Cardiac Enzymes: No results for input(s): "CKTOTAL", "CKMB", "CKMBINDEX", "TROPONINI" in the last 72 hours. BNP: Invalid input(s):  "POCBNP" D-Dimer: No results for input(s): "DDIMER" in the last 72 hours. Hemoglobin A1C: No results for input(s): "HGBA1C" in the last 72 hours. Fasting Lipid Panel: No results for input(s): "CHOL", "HDL", "LDLCALC", "TRIG", "CHOLHDL", "LDLDIRECT" in the last 72 hours. Thyroid Function Tests: No results for input(s): "TSH", "T4TOTAL", "T3FREE", "THYROIDAB" in the last 72 hours.  Invalid input(s): "FREET3" Anemia Panel: No results for input(s): "VITAMINB12", "FOLATE", "FERRITIN", "TIBC", "IRON", "RETICCTPCT" in the last 72 hours.  No results found.   Echo preserved left ventricular function EF around 55%  TELEMETRY: Atrial fibrillation rate controlled 75 nonspecific ST-T wave changes:  ASSESSMENT AND PLAN:  Active Problems:   HTN (hypertension)   Acute hypoxic respiratory failure (HCC)   Atrial fibrillation with RVR (HCC)   GERD (gastroesophageal reflux disease)   Acute heart failure with preserved ejection fraction (HFpEF) (HCC)   Epistaxis   Acute kidney injury superimposed on CKD (HCC)   Acute metabolic encephalopathy   Acute pulmonary embolism (HCC)   Uncontrolled type 2 diabetes mellitus with hyperglycemia, with long-term current use of insulin (HCC)   Chest pain    Plan Shortness of breath somewhat improved related to heart failure pulmonary emboli improving continue current therapy Hypertension continue management and control Continue anticoagulation low-dose Eliquis because of PE DVT GERD continue PPI therapy Epistaxis in the past  treated by ENT with cautery continue current therapy Diabetes type 2 uncomplicated continue current therapy follow-up PMD return to clinic Atrial fibrillation rate controlled metoprolol diltiazem currently on Eliquis for anticoagulation Consider electrical cardioversion once anticoagulated for 3 to 4 weeks Increase activity physical therapy Occupational Therapy ambulate in the hall Consider discharge home soon follow-up with cardiology  as an outpatient   Alwyn Pea, MD 04/07/2023 10:33 PM

## 2023-04-07 NOTE — Assessment & Plan Note (Signed)
Patient on Semglee insulin and NovoLog insulin plus sliding scale.

## 2023-04-07 NOTE — Assessment & Plan Note (Signed)
Resolved

## 2023-04-07 NOTE — Progress Notes (Signed)
Progress Note   Patient: Samantha Freeman HBZ:[id number] DOB: 1936-04-14 DOA: 03/28/2023     10 DOS: the patient was seen and examined on 04/07/2023   Brief hospital course: 87 year old female past medical history of atrial fibrillation not on any anticoagulation, heart failure with preserved ejection fraction, type 2 diabetes mellitus, hypertension, hyperlipidemia, CKD, depression and anxiety and she presented with acute respiratory failure atrial fibrillation with rapid ventricular response acute on chronic heart failure preserved ejection fraction.  Patient was having increased work of breathing last couple days.  Patient initially was on BiPAP and switched to high flow nasal cannula.  Patient diagnosed with acute pulmonary emboli and was initially started on heparin.  Patient had epistaxis that required embolization procedure by vascular surgery.  Patient now on Eliquis.  12/14 patient had a heart rate of 147 with walking with physical therapy 12/15.  Patient able to come off oxygen and able to hold her heart rate with ambulation.  Will check an overnight oximetry.  Patient had chest pain today and 2 troponins were negative.  Assessment and Plan: Atrial fibrillation with RVR (HCC) Persistent atrial fibrillation.  Continue Eliquis, metoprolol, diltiazem and digoxin.  Patient held her heart rate with ambulating today.  Acute hypoxic respiratory failure (HCC) Initially required BiPAP and then high flow nasal cannula.  Today off oxygen.  The patient was placed on oxygen overnight.  Will get an overnight oximetry on room air.  Epistaxis Status post left maxillary artery embolization as per vascular surgery.  Acute heart failure with preserved ejection fraction (HFpEF) (HCC) Last ejection fraction 60%. On Lasix 20 mg daily and metoprolol.  Acute pulmonary embolism (HCC) Continue Eliquis  Acute kidney injury superimposed on CKD (HCC) AKI on CKD stage IIIa.  Creatinine 1.29 on presentation  and now down to 0.82.  Acute metabolic encephalopathy Resolved  HTN (hypertension) Patient on metoprolol and Cardizem  GERD (gastroesophageal reflux disease) PPI    Chest pain Likely noncardiac with cardiac enzymes negative.  Uncontrolled type 2 diabetes mellitus with hyperglycemia, with long-term current use of insulin (HCC) Patient on Semglee insulin and NovoLog insulin plus sliding scale.        Subjective: Patient seen this morning and was feeling okay.  I had called back to [place] secondary to chest pain.  EKG shows atrial fibrillation with nonspecific ST-T wave changes.  Patient felt better in the afternoon.  Physical Exam: Vitals:   04/07/23 0052 04/07/23 0330 04/07/23 0813 04/07/23 1219  BP: (!) 130/95 134/72 130/74 118/67  Pulse: 70 64 63 71  Resp: 20 18    Temp: 97.8 F (36.6 C) 97.8 F (36.6 C) 97.7 F (36.5 C) 97.6 F (36.4 C)  TempSrc:      SpO2: 95% 91% 94% 95%  Weight:      Height:       Physical Exam HENT:     Head: Normocephalic.     Mouth/Throat:     Pharynx: No oropharyngeal exudate.  Eyes:     General: Lids are normal.     Conjunctiva/sclera: Conjunctivae normal.  Cardiovascular:     Rate and Rhythm: Normal rate. Rhythm irregularly irregular.     Heart sounds: Normal heart sounds, S1 normal and S2 normal.  Pulmonary:     Breath sounds: No decreased breath sounds, wheezing, rhonchi or rales.  Abdominal:     Palpations: Abdomen is soft.     Tenderness: There is no abdominal tenderness.  Musculoskeletal:     Right lower leg: No swelling.  Left lower leg: No swelling.  Skin:    General: Skin is warm.     Findings: No rash.  Neurological:     Mental Status: She is alert and oriented to person, place, and time.     Data Reviewed: Sodium 132, creatinine 0.82, CBC normal range EKG reviewed by me atrial fibrillation without ST elevation.  Does have nonspecific ST-T wave changes.  Family Communication: Spoke with son on the  phone  Disposition: Status is: Inpatient Remains inpatient appropriate because: Patient had chest pain today but was able to hold her saturations with walking and heart rate with walking today.  Will check overnight oximetry since she did desat last night.  Planned Discharge Destination: Home with Home Health    Time spent: 28 minutes  Author: Alford Highland, MD 04/07/2023 4:18 PM  For on call review [email or web address].

## 2023-04-07 NOTE — Hospital Course (Addendum)
87 year old female past medical history of atrial fibrillation not on any anticoagulation, heart failure with preserved ejection fraction, type 2 diabetes mellitus, hypertension, hyperlipidemia, CKD, depression and anxiety and she presented with acute respiratory failure atrial fibrillation with rapid ventricular response acute on chronic heart failure preserved ejection fraction.  Patient was having increased work of breathing last couple days.  Patient initially was on BiPAP and switched to high flow nasal cannula.  Patient diagnosed with acute pulmonary emboli and was initially started on heparin.  Patient had epistaxis that required embolization procedure by vascular surgery.  Patient now on Eliquis.  12/14 patient had a heart rate of 147 with walking with physical therapy 12/15.  Patient able to come off oxygen and able to hold her heart rate with ambulation.  Will check an overnight oximetry.  Patient had chest pain today and 2 troponins were negative. 1216.  Patient feeling okay.  Reviewed overnight oximetry and if she did not desaturate for most of the time in the 80s with overnight oximetry.  Off oxygen during the day.

## 2023-04-07 NOTE — Plan of Care (Signed)

## 2023-04-07 NOTE — Assessment & Plan Note (Signed)
-   Continue Eliquis 

## 2023-04-07 NOTE — Progress Notes (Signed)
Physical Therapy Treatment Patient Details Name: Samantha Freeman MRN: [id number] DOB: [birth year] Today's Date: 04/07/2023   History of Present Illness Pt is a 87 y.o. female admitted with AFIB with RVR, acute hypoxic respiratory failure, acute heart failure, & encephalopathy. PMH significant for atrial fibrillation not on AC, HFpEF, type 2 diabetes, hypertension, hyperlipidemia, CKD stage IIIa    PT Comments  Patient is agreeable to PT session. She increased ambulation distance this session. Patient walked a lap in the hallway with supervision using rolling walker. Fatigue with activity with no significant dyspnea with exertion. Sp02 90-94% on room air with ambulation and heart rate 90-100bpm. Recommend to continue PT to maximize independence and facilitate return to prior level of function. Patient is hopeful for discharge home.    If plan is discharge home, recommend the following: A little help with walking and/or transfers;A little help with bathing/dressing/bathroom;Assistance with cooking/housework;Assist for transportation   Can travel by private vehicle        Equipment Recommendations  None recommended by PT    Recommendations for Other Services       Precautions / Restrictions Precautions Precautions: Fall Precaution Comments: watch HR and O2 Restrictions Weight Bearing Restrictions Per Provider Order: No     Mobility  Bed Mobility               General bed mobility comments: not observed as patient sitting up on arrival and post session    Transfers Overall transfer level: Needs assistance Equipment used: Rolling walker (2 wheels) Transfers: Sit to/from Stand Sit to Stand: Supervision                Ambulation/Gait Ambulation/Gait assistance: Supervision Gait Distance (Feet): 175 Feet Assistive device: Rolling walker (2 wheels) Gait Pattern/deviations: Step-through pattern Gait velocity: decreased     General Gait Details: verbal cues for  pacing for endurance. patient is fatigued with activity with no significant dyspnea noted   Stairs             Wheelchair Mobility     Tilt Bed    Modified Rankin (Stroke Patients Only)       Balance Overall balance assessment: Needs assistance Sitting-balance support: Feet supported Sitting balance-Leahy Scale: Normal     Standing balance support: Bilateral upper extremity supported, During functional activity Standing balance-Leahy Scale: Good                              Cognition Arousal: Alert Behavior During Therapy: WFL for tasks assessed/performed Overall Cognitive Status: Within Functional Limits for tasks assessed                                          Exercises      General Comments General comments (skin integrity, edema, etc.): Sp02 90-94% on room air with activity. heart rate 90-100bpm with ambulation      Pertinent Vitals/Pain Pain Assessment Pain Assessment: No/denies pain Pain Location: had L side chest pressure earlier but has subsided    Home Living                          Prior Function            PT Goals (current goals can now be found in the care plan section) Acute Rehab PT Goals Patient Stated  Goal: get better and go home PT Goal Formulation: With patient Time For Goal Achievement: 04/13/23 Potential to Achieve Goals: Good Progress towards PT goals: Progressing toward goals    Frequency    Min 1X/week      PT Plan      Co-evaluation              AM-PAC PT "6 Clicks" Mobility   Outcome Measure  Help needed turning from your back to your side while in a flat bed without using bedrails?: A Little Help needed moving from lying on your back to sitting on the side of a flat bed without using bedrails?: A Little Help needed moving to and from a bed to a chair (including a wheelchair)?: A Little Help needed standing up from a chair using your arms (e.g., wheelchair or  bedside chair)?: A Little Help needed to walk in hospital room?: A Little Help needed climbing 3-5 steps with a railing? : A Little 6 Click Score: 18    End of Session   Activity Tolerance: Patient tolerated treatment well Patient left: in chair;with call bell/phone within reach Nurse Communication: Mobility status PT Visit Diagnosis: Muscle weakness (generalized) (M62.81);Difficulty in walking, not elsewhere classified (R26.2)     Time: 2956-2130 PT Time Calculation (min) (ACUTE ONLY): 14 min  Charges:    $Therapeutic Activity: 8-22 mins PT General Charges $$ ACUTE PT VISIT: 1 Visit                     Donna Bernard, PT, MPT    Ina Homes 04/07/2023, 1:24 PM

## 2023-04-08 DIAGNOSIS — G4734 Idiopathic sleep related nonobstructive alveolar hypoventilation: Secondary | ICD-10-CM | POA: Insufficient documentation

## 2023-04-08 DIAGNOSIS — I4891 Unspecified atrial fibrillation: Secondary | ICD-10-CM | POA: Diagnosis not present

## 2023-04-08 DIAGNOSIS — J9601 Acute respiratory failure with hypoxia: Secondary | ICD-10-CM | POA: Diagnosis not present

## 2023-04-08 DIAGNOSIS — I2699 Other pulmonary embolism without acute cor pulmonale: Secondary | ICD-10-CM | POA: Diagnosis not present

## 2023-04-08 DIAGNOSIS — I5031 Acute diastolic (congestive) heart failure: Secondary | ICD-10-CM | POA: Diagnosis not present

## 2023-04-08 DIAGNOSIS — R0902 Hypoxemia: Secondary | ICD-10-CM | POA: Insufficient documentation

## 2023-04-08 DIAGNOSIS — R079 Chest pain, unspecified: Secondary | ICD-10-CM

## 2023-04-08 LAB — GLUCOSE, CAPILLARY
Glucose-Capillary: 203 mg/dL — ABNORMAL HIGH (ref 70–99)
Glucose-Capillary: 320 mg/dL — ABNORMAL HIGH (ref 70–99)

## 2023-04-08 MED ORDER — DILTIAZEM HCL ER COATED BEADS 360 MG PO CP24: 360.0000 mg | ORAL_CAPSULE | Freq: Every day | ORAL | 0 refills | Status: DC

## 2023-04-08 MED ORDER — FUROSEMIDE 20 MG PO TABS: 20.0000 mg | ORAL_TABLET | Freq: Every day | ORAL | 0 refills | Status: DC

## 2023-04-08 MED ORDER — GLUCERNA SHAKE PO LIQD: 237.0000 mL | Freq: Three times a day (TID) | ORAL | 0 refills | Status: AC

## 2023-04-08 MED ORDER — LANTUS SOLOSTAR 100 UNIT/ML ~~LOC~~ SOPN: 30.0000 [IU] | PEN_INJECTOR | Freq: Every day | SUBCUTANEOUS | Status: DC

## 2023-04-08 MED ORDER — SALINE SPRAY 0.65 % NA SOLN: 2.0000 | Freq: Four times a day (QID) | NASAL | 0 refills | Status: DC

## 2023-04-08 MED ORDER — APIXABAN 5 MG PO TABS: ORAL_TABLET | ORAL | 0 refills | Status: DC

## 2023-04-08 NOTE — Inpatient Diabetes Management (Addendum)
Inpatient Diabetes Program Recommendations  AACE/ADA: New Consensus Statement on Inpatient Glycemic Control   Target Ranges:  Prepandial:   less than 140 mg/dL      Peak postprandial:   less than 180 mg/dL (1-2 hours)      Critically ill patients:  140 - 180 mg/dL    Latest Reference Range & Units 04/07/23 08:15 04/07/23 12:19 04/07/23 16:37 04/07/23 20:39 04/08/23 08:31  Glucose-Capillary 70 - 99 mg/dL 106 (H) 269 (H) 485 (H) 177 (H) 203 (H)   Review of Glycemic Control  Diabetes history: DM2 Outpatient Diabetes medications: Farxiga 10 mg daily, Humalog 7-19 units TID with meals, Lantus 36 units QHS Current orders for Inpatient glycemic control: Semglee 30 units at bedtime, Novolog 0-15 units TID with meals, Novolog 10 units TID with meals  Inpatient Diabetes Program Recommendations:    Insulin: Please consider increasing Semglee to 33 units at bedtime and meal coverage to Novolog 14 units TID with meals.  Thanks, Orlando Penner, RN, MSN, CDCES Diabetes Coordinator Inpatient Diabetes Program [phone] (Team Pager from 8am to 5pm)

## 2023-04-08 NOTE — TOC Transition Note (Signed)
Transition of Care [facility]) - Discharge Note   Patient Details  Name: Samantha Freeman MRN: [id number] Date of Birth: [birth year]  [facility] CM/SW Contact:  Truddie Hidden, RN Phone Number: 04/08/2023, 12:22 PM   Clinical Narrative:    Spoke with patient regarding discharge today. Patient's son will transport her home. She has been advised [facility] will contact her to schedule an appointment. She was also informed [employer] will supply her nocturnal oxygen and will contact her for the set up.   Request for nocturnal home oxygen sent to [facility] from [employer]   Adelina Mings from [facility] notified of discharge.   TOC singing off.     Final next level of care: Home/Self Care Barriers to Discharge: Continued Medical Work up   Patient Goals and CMS Choice Patient states their goals for this hospitalization and ongoing recovery are:: UNK          Discharge Placement                       Discharge Plan and Services Additional resources added to the After Visit Summary for   In-house Referral: Clinical Social Work   Post Acute Care Choice: NA Loreli Slot)            DME Agency: Other - Comment (Patient with [facility]) Date DME Agency Contacted: 03/28/23 Time DME Agency Contacted: 1620 Representative spoke with at DME Agency: Jennette Kettle [phone] HH Arranged: PT, OT HH Agency: NA ([facility])     Representative spoke with at [facility] Agency: Deanna [phone]  Social Drivers of Health (SDOH) Interventions SDOH Screenings   Food Insecurity: No Food Insecurity (03/28/2023)  Housing: Low Risk  (03/28/2023)  Transportation Needs: No Transportation Needs (03/28/2023)  Utilities: Not At Risk (03/28/2023)  Tobacco Use: Low Risk  (03/28/2023)     Readmission Risk Interventions    03/28/2023    4:20 PM  Readmission Risk Prevention Plan  Transportation Screening Complete  PCP or Specialist Appt within 3-5 Days Complete  HRI or Home Care Consult Complete  Social Work Consult for  Recovery Care Planning/Counseling Complete  Palliative Care Screening Not Applicable  Medication Review Oceanographer) Complete

## 2023-04-08 NOTE — Discharge Summary (Signed)
Physician Discharge Summary   Patient: Samantha Freeman MRN: [id number] DOB: March 27, 1936  Admit date:     03/28/2023  Discharge date: 04/08/23  Discharge Physician: Alford Highland   PCP: Marguarite Arbour, MD   Recommendations at discharge:   Follow-up PCP 5 days Follow-up cardiology 1 week Follow-up vascular surgery in a few weeks  Discharge Diagnoses: Active Problems:   Atrial fibrillation with RVR (HCC)   Acute hypoxic respiratory failure (HCC)   Acute heart failure with preserved ejection fraction (HFpEF) (HCC)   Epistaxis   Acute pulmonary embolism (HCC)   Acute kidney injury superimposed on CKD (HCC)   Acute metabolic encephalopathy   HTN (hypertension)   GERD (gastroesophageal reflux disease)   Uncontrolled type 2 diabetes mellitus with hyperglycemia, with long-term current use of insulin (HCC)   Chest pain Nocturnal hypoxemia  Hospital Course: 87 year old female past medical history of atrial fibrillation not on any anticoagulation, heart failure with preserved ejection fraction, type 2 diabetes mellitus, hypertension, hyperlipidemia, CKD, depression and anxiety and she presented with acute respiratory failure atrial fibrillation with rapid ventricular response acute on chronic heart failure preserved ejection fraction.  Patient was having increased work of breathing last couple days.  Patient initially was on BiPAP and switched to high flow nasal cannula.  Patient diagnosed with acute pulmonary emboli and was initially started on heparin.  Patient had epistaxis that required embolization procedure by vascular surgery.  Patient now on Eliquis.  12/14 patient had a heart rate of 147 with walking with physical therapy 12/15.  Patient able to come off oxygen and able to hold her heart rate with ambulation.  Will check an overnight oximetry.  Patient had chest pain today and 2 troponins were negative. 1216.  Patient feeling okay.  Reviewed overnight oximetry and if she did not  desaturate for most of the time in the 80s with overnight oximetry.  Off oxygen during the day.  Assessment and Plan: Atrial fibrillation with RVR (HCC) Persistent atrial fibrillation.  Continue Eliquis, metoprolol, diltiazem and digoxin.  Patient held her heart rate with ambulating yesterday.  Acute hypoxic respiratory failure (HCC) Initially required BiPAP and then high flow nasal cannula.  Yesterday off oxygen.  Overnight oximetry showed nocturnal desaturations for most of the night in the 80s.  Will prescribe oxygen 2 L nasal cannula at night.  Epistaxis Status post left maxillary artery embolization by vascular surgery.  Acute heart failure with preserved ejection fraction (HFpEF) (HCC) Last ejection fraction 60%. On Lasix 20 mg daily and metoprolol.  Acute pulmonary embolism (HCC) Continue Eliquis  Acute kidney injury superimposed on CKD (HCC) AKI on CKD stage IIIa.  Creatinine 1.29 on presentation and now down to 0.82.  Acute metabolic encephalopathy Resolved  HTN (hypertension) Patient on metoprolol and Cardizem  GERD (gastroesophageal reflux disease) PPI    Nocturnal hypoxia Qualifies for nocturnal oxygen with desaturations in the 80s throughout the night.  Likely has underlying sleep apnea.  She does not want a CPAP.  Chest pain Likely noncardiac with cardiac enzymes negative.  Uncontrolled type 2 diabetes mellitus with hyperglycemia, with long-term current use of insulin (HCC) Patient on 30 units of Semglee insulin at night and NovoLog insulin plus sliding scale.         Consultants: Cardiology Procedures performed: None Disposition: Home Diet recommendation:  Cardiac diet, diabetic diet DISCHARGE MEDICATION: Allergies as of 04/08/2023       Reactions   Buspirone    Other Reaction(s): Dizziness   Propofol Anaphylaxis  Zolpidem Other (See Comments)   Hallucinations   Cholestyramine Itching, Rash   Codeine Nausea Only   Hydrochlorothiazide  Other (See Comments)   PATIENT DOES NOT REMEMBER   Loratadine Other (See Comments)   PATIENT DOES NOT REMEMBER   Niacin Rash        Medication List     STOP taking these medications    aspirin EC 81 MG tablet   fenofibrate 48 MG tablet Commonly known as: TRICOR   ferrous sulfate 325 (65 FE) MG tablet   hydrOXYzine 25 MG tablet Commonly known as: ATARAX   traZODone 50 MG tablet Commonly known as: DESYREL       TAKE these medications    ALPRAZolam 0.5 MG tablet Commonly known as: XANAX Take 0.5 mg by mouth at bedtime as needed for anxiety or sleep.   apixaban 5 MG Tabs tablet Commonly known as: ELIQUIS Take 2 tablets (10 mg total) by mouth 2 (two) times daily for 3 days, THEN 1 tablet (5 mg total) 2 (two) times daily for 27 days. Start taking on: April 08, 2023   blood glucose meter kit and supplies Kit Dispense based on patient and insurance preference. Use up to four times daily as directed. (FOR ICD-9 250.00, 250.01). For QAC - HS accuchecks.   CALCIUM 600/VITAMIN D3 PO Take 1 tablet by mouth daily.   Coenzyme Q10 10 MG capsule Take 10 mg by mouth every morning.   cyanocobalamin 1000 MCG tablet Commonly known as: VITAMIN B12 Take 1,000 mcg by mouth daily.   dapagliflozin propanediol 10 MG Tabs tablet Commonly known as: FARXIGA Take 10 mg by mouth daily.   digoxin 0.125 MG tablet Commonly known as: LANOXIN Take 1 tablet (0.125 mg total) by mouth every other day.   diltiazem 360 MG 24 hr capsule Commonly known as: CARDIZEM CD Take 1 capsule (360 mg total) by mouth daily. Start taking on: April 09, 2023 What changed:  medication strength how much to take   feeding supplement (GLUCERNA SHAKE) Liqd Take 237 mLs by mouth 3 (three) times daily between meals.   FREESTYLE LITE test strip Generic drug: glucose blood For glucose testing every before meals at bedtime. Diagnosis E 11.65  Can substitute to any accepted brand   furosemide 20 MG  tablet Commonly known as: LASIX Take 1 tablet (20 mg total) by mouth daily. Start taking on: April 09, 2023 What changed:  when to take this reasons to take this   gabapentin 100 MG capsule Commonly known as: NEURONTIN Take 200 mg by mouth 3 (three) times daily.   insulin lispro 100 UNIT/ML injection Commonly known as: HUMALOG Inject 7-19 Units into the skin 3 (three) times daily before meals. Blood Glucose level: 140-199 - 7 units, 200-250 - 9 units, 251-299 - 13 units,  300-349 - 17 units,  350 or above 19 units.   Insulin Syringe-Needle U-100 25G X 1" 1 ML Misc For 4 times a day insulin SQ, 1 month supply. Diagnosis E11.65   Lantus SoloStar 100 UNIT/ML Solostar Pen Generic drug: insulin glargine Inject 30 Units into the skin at bedtime. What changed: how much to take   magnesium oxide 400 MG tablet Commonly known as: MAG-OX Take 800 mg by mouth 3 (three) times daily.   metoprolol tartrate 50 MG tablet Commonly known as: LOPRESSOR Take 1 tablet (50 mg total) by mouth 2 (two) times daily.   multivitamin tablet Take 1 tablet by mouth daily. Women's Daily Multivitamin   pantoprazole 40  MG tablet Commonly known as: PROTONIX Take 1 tablet (40 mg total) by mouth daily.   PARoxetine 10 MG tablet Commonly known as: PAXIL Take 10 mg by mouth daily.   prednisoLONE acetate 1 % ophthalmic suspension Commonly known as: PRED FORTE Place 1 drop into both eyes at bedtime.   rOPINIRole 1 MG tablet Commonly known as: REQUIP Take 1 mg by mouth at bedtime.   rosuvastatin 10 MG tablet Commonly known as: CRESTOR Take 10 mg by mouth daily.   sodium chloride 0.65 % Soln nasal spray Commonly known as: OCEAN Place 2 sprays into both nostrils 4 (four) times daily.               Durable Medical Equipment  (From admission, onward)           Start     Ordered   04/08/23 1006  For home use only DME oxygen  Once       Question Answer Comment  Length of Need Lifetime    Mode or (Route) Nasal cannula   Liters per Minute 2   Frequency Only at night (stationary unit needed)   Oxygen conserving device Yes   Oxygen delivery system Gas      04/08/23 1005            Follow-up Information     Paraschos, Alexander, MD. Go in 1 week(s).   Specialty: Cardiology Why: Appointment on Monday, 04/15/2023 at 3:45pm. Contact information: [address] [facility] [place] [address] [phone]         Gilda Crease, Latina Craver, MD Follow up in 1 month(s).   Specialties: Vascular Surgery, Cardiology, Radiology, Vascular Surgery Contact information: [address] [place] [address] [phone]         Marguarite Arbour, MD Follow up in 5 day(s).   Specialty: Internal Medicine Contact information: [address] [place] [address] [phone]                Discharge Exam: Ceasar Mons Weights   03/28/23 1029 03/28/23 2145  Weight: 86.1 kg 81 kg   Physical Exam HENT:     Head: Normocephalic.     Mouth/Throat:     Pharynx: No oropharyngeal exudate.  Eyes:     General: Lids are normal.     Conjunctiva/sclera: Conjunctivae normal.  Cardiovascular:     Rate and Rhythm: Normal rate. Rhythm irregularly irregular.     Heart sounds: Normal heart sounds, S1 normal and S2 normal.  Pulmonary:     Breath sounds: No decreased breath sounds, wheezing, rhonchi or rales.  Abdominal:     Palpations: Abdomen is soft.     Tenderness: There is no abdominal tenderness.  Musculoskeletal:     Right lower leg: No swelling.     Left lower leg: No swelling.  Skin:    General: Skin is warm.     Findings: No rash.  Neurological:     Mental Status: She is alert and oriented to person, place, and time.      Condition at discharge: stable  The results of significant diagnostics from this hospitalization (including imaging, microbiology, ancillary and laboratory) are listed below  for reference.   Imaging Studies: PERIPHERAL VASCULAR CATHETERIZATION Result Date: 04/02/2023 See surgical note for result.  CT Angio Chest PE W and/or Wo Contrast Result Date: 03/28/2023 CLINICAL DATA:  Shortness of breath EXAM: CT ANGIOGRAPHY CHEST WITH CONTRAST TECHNIQUE: Multidetector CT imaging of the  chest was performed using the standard protocol during bolus administration of intravenous contrast. Multiplanar CT image reconstructions and MIPs were obtained to evaluate the vascular anatomy. RADIATION DOSE REDUCTION: This exam was performed according to the departmental dose-optimization program which includes automated exposure control, adjustment of the mA and/or kV according to patient size and/or use of iterative reconstruction technique. CONTRAST:  75mL OMNIPAQUE IOHEXOL 350 MG/ML SOLN COMPARISON:  CT angiogram 02/25/2023.  X-ray 03/28/2023 and older. FINDINGS: Cardiovascular: Heart is enlarged. Trace pericardial fluid. Coronary artery calcifications are seen. Minimal contrast opacification along the thoracic aorta. Grossly normal course and caliber with vascular calcifications. There is some dilatation of the main pulmonary artery. Please correlate for pulmonary artery hypertension. There is a pulmonary embolism identified in the upper aspect of the middle lobe as seen on series 6, image 213. This has not seen on the previous examination. There is also embolus along the lingula on series 6, image 180, not seen previously. Overall mild clot burden. No larger or more central embolus. Some limited evaluation related to motion. Mediastinum/Nodes: Patulous esophagus. Heterogeneous thyroid gland. There are several prominent nodes identified in the mediastinum and hilum bilaterally, similar to previous. No axillary nodes. Lungs/Pleura: Trace pleural fluid with the adjacent parenchymal opacities. The opacities are increasing from previous at the lower lobes. There are also some dependent areas in the  upper lobes. There is some new nodules identified as well in the left upper lobe measuring 10 mm on series 5, image 38. With the time course favor a infectious or inflammatory process as this has not seen previously. There are areas of bronchial wall thickening, mucous plugging and interstitial septal thickening. Few areas of ground-glass as well. Upper Abdomen: The adrenal glands are preserved in the upper abdomen. Stomach is relatively collapsed punctate nonobstructing right-sided renal stone at the edge of the imaging field. Musculoskeletal: Surgical changes about the left shoulder. Scattered degenerative changes along the spine. Review of the MIP images confirms the above findings. Critical Value/emergent results were called by telephone at the time of interpretation on 03/28/2023 at 4:21 pm EST to provider Dr. Alvester Morin, who verbally acknowledged these results. IMPRESSION: New bilateral few segmental and smaller pulmonary emboli. Mild clot burden. No larger or more central embolus. There is some enlargement of the main pulmonary arteries. Please correlate for pulmonary artery hypertension. This has seen previously. Enlarged heart. Increasing parenchymal bilateral lung opacities identified with some subtle nodularity, ground-glass and interstitial septal thickening. Few presumed reactive nodes. Recommend follow up to confirm resolution. Persistent tiny pleural effusions, left-greater-than-right. Aortic Atherosclerosis (ICD10-I70.0). Electronically Signed   By: Karen Kays M.D.   On: 03/28/2023 16:42   CT HEAD WO CONTRAST ( ) Result Date: 03/28/2023 CLINICAL DATA:  Mental status change, unknown cause. EXAM: CT HEAD WITHOUT CONTRAST TECHNIQUE: Contiguous axial images were obtained from the base of the skull through the vertex without intravenous contrast. RADIATION DOSE REDUCTION: This exam was performed according to the departmental dose-optimization program which includes automated exposure control, adjustment  of the mA and/or kV according to patient size and/or use of iterative reconstruction technique. COMPARISON:  None Available. FINDINGS: Brain: There is no evidence of an acute infarct, intracranial hemorrhage, mass, midline shift, or extra-axial fluid collection. Mild cerebral atrophy is within normal limits for age. Hypodensities in the cerebral white matter nonspecific but compatible with mild chronic small vessel ischemic disease. Vascular: Calcified atherosclerosis at the skull base. No hyperdense vessel. Skull: No acute fracture or suspicious osseous lesion. Sinuses/Orbits: Chronic left sphenoid sinusitis  with near complete opacification. Mild mucosal thickening in the paranasal sinuses elsewhere. Clear mastoid air cells. Bilateral cataract extraction. Other: None. IMPRESSION: 1. No evidence of acute intracranial abnormality. 2. Mild chronic small vessel ischemic disease. Electronically Signed   By: Sebastian Ache M.D.   On: 03/28/2023 15:52   DG Chest Portable 1 View Result Date: 03/28/2023 CLINICAL DATA:  Shortness of breath EXAM: PORTABLE CHEST - 1 VIEW COMPARISON:  02/25/2023 FINDINGS: Unchanged borderline cardiomegaly. No significant pulmonary vascular congestion. Unchanged mild left basilar opacity favored to be atelectasis. Reversed left total shoulder prosthesis again seen. IMPRESSION: 1. Unchanged mild cardiomegaly. 2. Unchanged left basilar opacity likely due to atelectasis. Electronically Signed   By: Acquanetta Belling M.D.   On: 03/28/2023 11:42    Microbiology: Results for orders placed or performed during the hospital encounter of 03/28/23  SARS Coronavirus 2 by RT PCR (hospital order, performed in [facility] hospital lab) *cepheid single result test* Anterior Nasal Swab     Status: None   Collection Time: 03/28/23 10:38 AM   Specimen: Anterior Nasal Swab  Result Value Ref Range Status   SARS Coronavirus 2 by RT PCR NEGATIVE NEGATIVE Final    Comment: (NOTE) SARS-CoV-2 target nucleic acids  are NOT DETECTED.  The SARS-CoV-2 RNA is generally detectable in upper and lower respiratory specimens during the acute phase of infection. The lowest concentration of SARS-CoV-2 viral copies this assay can detect is 250 copies / mL. A negative result does not preclude SARS-CoV-2 infection and should not be used as the sole basis for treatment or other patient management decisions.  A negative result may occur with improper specimen collection / handling, submission of specimen other than nasopharyngeal swab, presence of viral mutation(s) within the areas targeted by this assay, and inadequate number of viral copies (<250 copies / mL). A negative result must be combined with clinical observations, patient history, and epidemiological information.  Fact Sheet for Patients:   RoadLapTop.co.za  Fact Sheet for Healthcare Providers: http://kim-miller.com/  This test is not yet approved or  cleared by the [place] FDA and has been authorized for detection and/or diagnosis of SARS-CoV-2 by FDA under an Emergency Use Authorization (EUA).  This EUA will remain in effect (meaning this test can be used) for the duration of the COVID-19 declaration under Section 564(b)(1) of the Act, 21 U.S.C. section 360bbb-3(b)(1), unless the authorization is terminated or revoked sooner.  Performed at [facility], [address] [place], [place] [address]   MRSA Next Gen by PCR, Nasal     Status: None   Collection Time: 03/28/23  9:59 PM   Specimen: Nasal Mucosa; Nasal Swab  Result Value Ref Range Status   MRSA by PCR Next Gen NOT DETECTED NOT DETECTED Final    Comment: (NOTE) The GeneXpert MRSA Assay (FDA approved for NASAL specimens only), is one component of a comprehensive MRSA colonization surveillance program. It is not intended to diagnose MRSA infection nor to guide or monitor treatment for MRSA infections. Test performance is not  FDA approved in patients less than 69 years old. Performed at [facility], [address] [place], [place] [address]     Labs: CBC: Recent Labs  Lab 04/03/23 0441 04/04/23 0517 04/05/23 0616 04/06/23 0503 04/07/23 0556  WBC 8.8 8.3 8.6 8.8 8.6  HGB 13.2 13.3 13.0 12.8 13.6  HCT 39.8 39.9 39.1 38.5 40.6  MCV 90.2 88.3 90.1 87.9 90.4  PLT 256 264 291 292 309   Basic  Metabolic Panel: Recent Labs  Lab 04/02/23 0409 04/03/23 0441 04/04/23 0517 04/04/23 2155 04/06/23 0503 04/07/23 0556  NA 132* 131* 136  --  135 132*  K 3.9 3.9 3.8  --  3.7 4.2  CL 97* 97* 98  --  98 96*  CO2 27 26 29   --  26 27  GLUCOSE 286* 272* 162* 391* 220* 255*  BUN 23 20 16   --  19 20  CREATININE 0.88 0.79 0.73  --  0.87 0.82  CALCIUM 9.1 8.9 9.2  --  9.2 9.3  MG 1.4* 1.8  --   --   --   --    Liver Function Tests: No results for input(s): "AST", "ALT", "ALKPHOS", "BILITOT", "PROT", "ALBUMIN" in the last 168 hours. CBG: Recent Labs  Lab 04/07/23 1219 04/07/23 1637 04/07/23 2039 04/08/23 0831 04/08/23 1227  GLUCAP 368* 306* 177* 203* 320*    Discharge time spent: greater than 30 minutes.  Signed: Alford Highland, MD [facility] 04/08/2023

## 2023-04-08 NOTE — Assessment & Plan Note (Signed)
Qualifies for nocturnal oxygen with desaturations in the 80s throughout the night.  Likely has underlying sleep apnea.  She does not want a CPAP.

## 2023-04-26 ENCOUNTER — Other Ambulatory Visit: Payer: Self-pay | Admitting: Internal Medicine

## 2023-04-26 DIAGNOSIS — R053 Chronic cough: Secondary | ICD-10-CM

## 2023-05-06 ENCOUNTER — Ambulatory Visit: Payer: Medicare Other

## 2023-05-08 ENCOUNTER — Ambulatory Visit
Admission: RE | Admit: 2023-05-08 | Discharge: 2023-05-08 | Disposition: A | Discharge status: Home / Self Care | Payer: Medicare Other | Source: Ambulatory Visit | Attending: Internal Medicine | Admitting: Internal Medicine

## 2023-05-08 DIAGNOSIS — I130 Hypertensive heart and chronic kidney disease with heart failure and stage 1 through stage 4 chronic kidney disease, or unspecified chronic kidney disease: Secondary | ICD-10-CM | POA: Diagnosis not present

## 2023-05-08 DIAGNOSIS — R053 Chronic cough: Secondary | ICD-10-CM | POA: Insufficient documentation

## 2023-05-08 DIAGNOSIS — R0600 Dyspnea, unspecified: Secondary | ICD-10-CM | POA: Diagnosis not present

## 2023-05-11 ENCOUNTER — Inpatient Hospital Stay
Admission: EM | Admit: 2023-05-11 | Discharge: 2023-05-18 | DRG: 291 | Disposition: A | Discharge status: Home Health | Payer: Medicare Other | Attending: Internal Medicine | Admitting: Internal Medicine

## 2023-05-11 ENCOUNTER — Other Ambulatory Visit: Payer: Self-pay

## 2023-05-11 ENCOUNTER — Emergency Department: Payer: Medicare Other

## 2023-05-11 DIAGNOSIS — N182 Chronic kidney disease, stage 2 (mild): Secondary | ICD-10-CM | POA: Diagnosis present

## 2023-05-11 DIAGNOSIS — I482 Chronic atrial fibrillation, unspecified: Secondary | ICD-10-CM | POA: Diagnosis present

## 2023-05-11 DIAGNOSIS — I130 Hypertensive heart and chronic kidney disease with heart failure and stage 1 through stage 4 chronic kidney disease, or unspecified chronic kidney disease: Principal | ICD-10-CM | POA: Diagnosis present

## 2023-05-11 DIAGNOSIS — R0600 Dyspnea, unspecified: Secondary | ICD-10-CM | POA: Diagnosis present

## 2023-05-11 DIAGNOSIS — Z96651 Presence of right artificial knee joint: Secondary | ICD-10-CM | POA: Diagnosis present

## 2023-05-11 DIAGNOSIS — R040 Epistaxis: Secondary | ICD-10-CM | POA: Diagnosis not present

## 2023-05-11 DIAGNOSIS — E119 Type 2 diabetes mellitus without complications: Secondary | ICD-10-CM

## 2023-05-11 DIAGNOSIS — N1831 Chronic kidney disease, stage 3a: Secondary | ICD-10-CM

## 2023-05-11 DIAGNOSIS — Z888 Allergy status to other drugs, medicaments and biological substances status: Secondary | ICD-10-CM

## 2023-05-11 DIAGNOSIS — I10 Essential (primary) hypertension: Secondary | ICD-10-CM

## 2023-05-11 DIAGNOSIS — N183 Chronic kidney disease, stage 3 unspecified: Secondary | ICD-10-CM | POA: Diagnosis present

## 2023-05-11 DIAGNOSIS — Z90710 Acquired absence of both cervix and uterus: Secondary | ICD-10-CM

## 2023-05-11 DIAGNOSIS — Z884 Allergy status to anesthetic agent status: Secondary | ICD-10-CM

## 2023-05-11 DIAGNOSIS — E785 Hyperlipidemia, unspecified: Secondary | ICD-10-CM | POA: Diagnosis present

## 2023-05-11 DIAGNOSIS — I5031 Acute diastolic (congestive) heart failure: Secondary | ICD-10-CM | POA: Diagnosis not present

## 2023-05-11 DIAGNOSIS — E1122 Type 2 diabetes mellitus with diabetic chronic kidney disease: Secondary | ICD-10-CM | POA: Diagnosis present

## 2023-05-11 DIAGNOSIS — I4891 Unspecified atrial fibrillation: Secondary | ICD-10-CM

## 2023-05-11 DIAGNOSIS — I350 Nonrheumatic aortic (valve) stenosis: Secondary | ICD-10-CM | POA: Diagnosis present

## 2023-05-11 DIAGNOSIS — T502X5A Adverse effect of carbonic-anhydrase inhibitors, benzothiadiazides and other diuretics, initial encounter: Secondary | ICD-10-CM | POA: Diagnosis present

## 2023-05-11 DIAGNOSIS — E876 Hypokalemia: Secondary | ICD-10-CM | POA: Diagnosis present

## 2023-05-11 DIAGNOSIS — E1165 Type 2 diabetes mellitus with hyperglycemia: Secondary | ICD-10-CM | POA: Diagnosis present

## 2023-05-11 DIAGNOSIS — Z66 Do not resuscitate: Secondary | ICD-10-CM | POA: Diagnosis present

## 2023-05-11 DIAGNOSIS — Z96612 Presence of left artificial shoulder joint: Secondary | ICD-10-CM | POA: Diagnosis present

## 2023-05-11 DIAGNOSIS — D631 Anemia in chronic kidney disease: Secondary | ICD-10-CM | POA: Diagnosis present

## 2023-05-11 DIAGNOSIS — Z8616 Personal history of COVID-19: Secondary | ICD-10-CM

## 2023-05-11 DIAGNOSIS — Z794 Long term (current) use of insulin: Secondary | ICD-10-CM

## 2023-05-11 DIAGNOSIS — J9601 Acute respiratory failure with hypoxia: Secondary | ICD-10-CM | POA: Diagnosis present

## 2023-05-11 DIAGNOSIS — I2699 Other pulmonary embolism without acute cor pulmonale: Secondary | ICD-10-CM | POA: Diagnosis not present

## 2023-05-11 DIAGNOSIS — F419 Anxiety disorder, unspecified: Secondary | ICD-10-CM | POA: Diagnosis present

## 2023-05-11 DIAGNOSIS — Z79899 Other long term (current) drug therapy: Secondary | ICD-10-CM

## 2023-05-11 DIAGNOSIS — K219 Gastro-esophageal reflux disease without esophagitis: Secondary | ICD-10-CM | POA: Diagnosis present

## 2023-05-11 DIAGNOSIS — Z7901 Long term (current) use of anticoagulants: Secondary | ICD-10-CM

## 2023-05-11 DIAGNOSIS — I4821 Permanent atrial fibrillation: Secondary | ICD-10-CM | POA: Diagnosis not present

## 2023-05-11 DIAGNOSIS — Z86711 Personal history of pulmonary embolism: Secondary | ICD-10-CM | POA: Diagnosis not present

## 2023-05-11 DIAGNOSIS — I5033 Acute on chronic diastolic (congestive) heart failure: Secondary | ICD-10-CM | POA: Diagnosis present

## 2023-05-11 DIAGNOSIS — Z1152 Encounter for screening for COVID-19: Secondary | ICD-10-CM | POA: Diagnosis not present

## 2023-05-11 DIAGNOSIS — F32A Depression, unspecified: Secondary | ICD-10-CM | POA: Diagnosis present

## 2023-05-11 DIAGNOSIS — J302 Other seasonal allergic rhinitis: Secondary | ICD-10-CM | POA: Diagnosis present

## 2023-05-11 DIAGNOSIS — Z947 Corneal transplant status: Secondary | ICD-10-CM | POA: Diagnosis not present

## 2023-05-11 DIAGNOSIS — I509 Heart failure, unspecified: Principal | ICD-10-CM

## 2023-05-11 DIAGNOSIS — Z885 Allergy status to narcotic agent status: Secondary | ICD-10-CM

## 2023-05-11 LAB — RESPIRATORY PANEL BY PCR

## 2023-05-11 LAB — BASIC METABOLIC PANEL
Anion gap: 12 (ref 5–15)
BUN: 16 mg/dL (ref 8–23)
CO2: 28 mmol/L (ref 22–32)
Calcium: 8.7 mg/dL — ABNORMAL LOW (ref 8.9–10.3)
Chloride: 95 mmol/L — ABNORMAL LOW (ref 98–111)
Creatinine, Ser: 0.82 mg/dL (ref 0.44–1.00)
GFR, Estimated: >60 mL/min (ref >60)
Glucose, Bld: 168 mg/dL — ABNORMAL HIGH (ref 70–99)
Potassium: 3.2 mmol/L — ABNORMAL LOW (ref 3.5–5.1)
Sodium: 135 mmol/L (ref 135–145)

## 2023-05-11 LAB — TROPONIN I (HIGH SENSITIVITY)
Troponin I (High Sensitivity): 11 ng/L (ref <18)
Troponin I (High Sensitivity): 12 ng/L (ref <18)

## 2023-05-11 LAB — RESP PANEL BY RT-PCR (RSV, FLU A&B, COVID)  RVPGX2
Influenza A by PCR: NEGATIVE
Influenza B by PCR: NEGATIVE
Resp Syncytial Virus by PCR: NEGATIVE
SARS Coronavirus 2 by RT PCR: NEGATIVE

## 2023-05-11 LAB — CBC
HCT: 45.6 % (ref 36.0–46.0)
Hemoglobin: 14.3 g/dL (ref 12.0–15.0)
MCH: 29.3 pg (ref 26.0–34.0)
MCHC: 31.4 g/dL (ref 30.0–36.0)
MCV: 93.4 fL (ref 80.0–100.0)
Platelets: 277 10*3/uL (ref 150–400)
RBC: 4.88 MIL/uL (ref 3.87–5.11)
RDW: 15 % (ref 11.5–15.5)
WBC: 8.7 10*3/uL (ref 4.0–10.5)
nRBC: 0 % (ref 0.0–0.2)

## 2023-05-11 LAB — BRAIN NATRIURETIC PEPTIDE: B Natriuretic Peptide: 530.5 pg/mL — ABNORMAL HIGH (ref 0.0–100.0)

## 2023-05-11 LAB — CBG MONITORING, ED
Glucose-Capillary: 165 mg/dL — ABNORMAL HIGH (ref 70–99)
Glucose-Capillary: 193 mg/dL — ABNORMAL HIGH (ref 70–99)

## 2023-05-11 MED ORDER — ROPINIROLE HCL 1 MG PO TABS
1.0000 mg | ORAL_TABLET | Freq: Every day | ORAL | Status: DC
  Administered 2023-05-11 – 2023-05-13 (×3): 1 mg via ORAL
  Filled 2023-05-11 (×3): qty 1

## 2023-05-11 MED ORDER — SODIUM CHLORIDE 0.9 % IV SOLN: 250.0000 mL | INTRAVENOUS | Status: AC | PRN

## 2023-05-11 MED ORDER — ONDANSETRON HCL 4 MG/2ML IJ SOLN: 4.0000 mg | Freq: Four times a day (QID) | INTRAMUSCULAR | Status: DC | PRN

## 2023-05-11 MED ORDER — GABAPENTIN 100 MG PO CAPS
200.0000 mg | ORAL_CAPSULE | Freq: Three times a day (TID) | ORAL | Status: DC
  Administered 2023-05-11 – 2023-05-18 (×20): 200 mg via ORAL
  Filled 2023-05-11 (×20): qty 2

## 2023-05-11 MED ORDER — POTASSIUM CHLORIDE CRYS ER 20 MEQ PO TBCR
40.0000 meq | EXTENDED_RELEASE_TABLET | Freq: Once | ORAL | Status: AC
  Administered 2023-05-11: 40 meq via ORAL
  Filled 2023-05-11: qty 2

## 2023-05-11 MED ORDER — FUROSEMIDE 10 MG/ML IJ SOLN
40.0000 mg | Freq: Every day | INTRAMUSCULAR | Status: DC
  Administered 2023-05-12 – 2023-05-15 (×4): 40 mg via INTRAVENOUS
  Filled 2023-05-11 (×4): qty 4

## 2023-05-11 MED ORDER — APIXABAN 5 MG PO TABS
5.0000 mg | ORAL_TABLET | Freq: Two times a day (BID) | ORAL | Status: DC
  Administered 2023-05-11 – 2023-05-18 (×14): 5 mg via ORAL
  Filled 2023-05-11 (×14): qty 1

## 2023-05-11 MED ORDER — ALPRAZOLAM 0.5 MG PO TABS
0.5000 mg | ORAL_TABLET | Freq: Every evening | ORAL | Status: DC | PRN
  Administered 2023-05-11 – 2023-05-17 (×7): 0.5 mg via ORAL
  Filled 2023-05-11 (×7): qty 1

## 2023-05-11 MED ORDER — INSULIN GLARGINE-YFGN 100 UNIT/ML ~~LOC~~ SOLN
15.0000 [IU] | Freq: Every day | SUBCUTANEOUS | Status: DC
  Administered 2023-05-11 – 2023-05-14 (×4): 15 [IU] via SUBCUTANEOUS
  Filled 2023-05-11 (×4): qty 0.15

## 2023-05-11 MED ORDER — PANTOPRAZOLE SODIUM 40 MG PO TBEC
40.0000 mg | DELAYED_RELEASE_TABLET | Freq: Every day | ORAL | Status: DC
  Administered 2023-05-12 – 2023-05-18 (×7): 40 mg via ORAL
  Filled 2023-05-11 (×7): qty 1

## 2023-05-11 MED ORDER — INSULIN ASPART 100 UNIT/ML IJ SOLN
0.0000 [IU] | Freq: Three times a day (TID) | INTRAMUSCULAR | Status: DC
  Administered 2023-05-11: 3 [IU] via SUBCUTANEOUS
  Administered 2023-05-12: 5 [IU] via SUBCUTANEOUS
  Administered 2023-05-12 (×2): 3 [IU] via SUBCUTANEOUS
  Administered 2023-05-13: 5 [IU] via SUBCUTANEOUS
  Administered 2023-05-13 (×2): 3 [IU] via SUBCUTANEOUS
  Administered 2023-05-14: 8 [IU] via SUBCUTANEOUS
  Administered 2023-05-14: 11 [IU] via SUBCUTANEOUS
  Administered 2023-05-14: 5 [IU] via SUBCUTANEOUS
  Administered 2023-05-15: 3 [IU] via SUBCUTANEOUS
  Administered 2023-05-15: 11 [IU] via SUBCUTANEOUS
  Administered 2023-05-15: 15 [IU] via SUBCUTANEOUS
  Administered 2023-05-16: 3 [IU] via SUBCUTANEOUS
  Administered 2023-05-16: 11 [IU] via SUBCUTANEOUS
  Administered 2023-05-16: 8 [IU] via SUBCUTANEOUS
  Administered 2023-05-17: 15 [IU] via SUBCUTANEOUS
  Administered 2023-05-17: 11 [IU] via SUBCUTANEOUS
  Administered 2023-05-17 – 2023-05-18 (×2): 8 [IU] via SUBCUTANEOUS
  Administered 2023-05-18: 5 [IU] via SUBCUTANEOUS
  Filled 2023-05-11 (×19): qty 1

## 2023-05-11 MED ORDER — SODIUM CHLORIDE 0.9% FLUSH
3.0000 mL | Freq: Two times a day (BID) | INTRAVENOUS | Status: DC
  Administered 2023-05-11 – 2023-05-16 (×12): 3 mL via INTRAVENOUS

## 2023-05-11 MED ORDER — DIGOXIN 125 MCG PO TABS
0.1250 mg | ORAL_TABLET | ORAL | Status: DC
  Administered 2023-05-13 – 2023-05-17 (×3): 0.125 mg via ORAL
  Filled 2023-05-11 (×3): qty 1

## 2023-05-11 MED ORDER — DILTIAZEM HCL ER COATED BEADS 180 MG PO CP24
360.0000 mg | ORAL_CAPSULE | Freq: Every day | ORAL | Status: DC
  Administered 2023-05-12 – 2023-05-18 (×7): 360 mg via ORAL
  Filled 2023-05-11 (×7): qty 2

## 2023-05-11 MED ORDER — PREDNISOLONE ACETATE 1 % OP SUSP
1.0000 [drp] | Freq: Every day | OPHTHALMIC | Status: DC
  Administered 2023-05-12 – 2023-05-17 (×6): 1 [drp] via OPHTHALMIC
  Filled 2023-05-11 (×2): qty 5

## 2023-05-11 MED ORDER — ROSUVASTATIN CALCIUM 10 MG PO TABS
10.0000 mg | ORAL_TABLET | Freq: Every day | ORAL | Status: DC
  Administered 2023-05-12 – 2023-05-18 (×7): 10 mg via ORAL
  Filled 2023-05-11 (×7): qty 1

## 2023-05-11 MED ORDER — VITAMIN B-12 1000 MCG PO TABS
1000.0000 ug | ORAL_TABLET | Freq: Every day | ORAL | Status: DC
  Administered 2023-05-12 – 2023-05-18 (×7): 1000 ug via ORAL
  Filled 2023-05-11 (×2): qty 1
  Filled 2023-05-11: qty 2
  Filled 2023-05-11 (×2): qty 1
  Filled 2023-05-11: qty 2
  Filled 2023-05-11 (×2): qty 1

## 2023-05-11 MED ORDER — METOPROLOL TARTRATE 25 MG PO TABS
25.0000 mg | ORAL_TABLET | Freq: Two times a day (BID) | ORAL | Status: DC
Start: 2023-05-11 — End: 2023-05-18
  Administered 2023-05-11 – 2023-05-18 (×14): 25 mg via ORAL
  Filled 2023-05-11 (×15): qty 1

## 2023-05-11 MED ORDER — ACETAMINOPHEN 325 MG PO TABS
650.0000 mg | ORAL_TABLET | ORAL | Status: DC | PRN
  Administered 2023-05-13 – 2023-05-17 (×3): 650 mg via ORAL
  Filled 2023-05-11 (×3): qty 2

## 2023-05-11 MED ORDER — FUROSEMIDE 10 MG/ML IJ SOLN
60.0000 mg | Freq: Once | INTRAMUSCULAR | Status: AC
  Administered 2023-05-11: 60 mg via INTRAVENOUS
  Filled 2023-05-11: qty 8

## 2023-05-11 MED ORDER — PAROXETINE HCL 10 MG PO TABS
10.0000 mg | ORAL_TABLET | Freq: Every day | ORAL | Status: DC
  Administered 2023-05-12 – 2023-05-13 (×2): 10 mg via ORAL
  Filled 2023-05-11 (×3): qty 1

## 2023-05-11 MED ORDER — SODIUM CHLORIDE 0.9% FLUSH: 3.0000 mL | INTRAVENOUS | Status: DC | PRN

## 2023-05-11 NOTE — ED Triage Notes (Signed)
Pt to ED for shob and chest pain started today. Pt 87% on RA when titrated, placed on 4  LNC to maintain SpO2 >92%. Pt states she has O2 at home but doesn't wear it because she sleeps better without it.

## 2023-05-11 NOTE — H&P (Signed)
History and Physical    Patient: Samantha Freeman WJX:[id number] DOB: 1935/05/19 DOA: 05/11/2023 DOS: the patient was seen and examined on 05/11/2023 PCP: Marguarite Arbour, MD  Patient coming from: Home  Chief Complaint:  Chief Complaint  Patient presents with   Chest Pain   Shortness of Breath   HPI: Samantha Freeman is a 88 y.o. female with medical history significant of recent pulmonary embolism on Eliquis, atrial fibrillation on Eliquis, CHF, type 2 diabetes, hypertension, hyperlipidemia, CKD stage IIIa, depression/anxiety who presents to the ED 2/2 shortness of breathe.   Samantha Freeman states that approximately 1-1/2 weeks ago, she developed sinus congestion with green rhinorrhea and was placed on antibiotics.  Then approximately 2 days ago, she began to experience generalized fatigue and shortness of breath.  During this time, she had also noted that her weight had gone up; she weighs herself daily.  She was initially concerned that this may be due to her atrial fibrillation.  She endorses some chest pressure, that was relieved once oxygen was placed.  She denies any recent fever, chills.  She notes chronic nausea, vomiting, diarrhea that is unchanged compared to prior.  ED Course:  On arrival to the ED, patient was normotensive at 129/76 with heart rate of 82.  She was saturating at 93% on 4 L.  She was afebrile at 98.4.  Initial workup notable for normal CBC, potassium 3.2, glucose 168, creatinine 0.82 with GFR above 60.  Troponin negative at 11.  BNP elevated at 530.  COVID-19, influenza and RSV PCR negative.  Chest x-ray was obtained that demonstrated get interstitial pulmonary edema with small bilateral pleural effusions.  Patient started on Lasix and [facility] contacted for admission.  Review of Systems: As mentioned in the history of present illness. All other systems reviewed and are negative.  Past Medical History:  Diagnosis Date   Anemia    Anxiety    Aortic stenosis, mild     Arthritis    CKD (chronic kidney disease), stage III (HCC)    Complication of anesthesia    Propofol anaphylaxis   Cortical cataract    DDD (degenerative disc disease), lumbar    Depression    Dyspnea    GERD (gastroesophageal reflux disease)    Headache    Heart murmur    History of 2019 novel coronavirus disease (COVID-19) 12/01/2018   HLD (hyperlipidemia)    Hypertension    Pneumonia    Schatzki's ring    Seasonal allergies    T2DM (type 2 diabetes mellitus) (HCC)    Valvular insufficiency    Past Surgical History:  Procedure Laterality Date   ABDOMINAL HYSTERECTOMY     APPENDECTOMY     BICEPT TENODESIS Right 05/13/2018   Procedure: BICEPS TENODESIS;  Surgeon: Christena Flake, MD;  Location: [facility] ORS;  Service: Orthopedics;  Laterality: Right;   CARDIAC CATHETERIZATION     COLONOSCOPY     EMBOLIZATION (CATH LAB) Bilateral 04/02/2023   Procedure: EMBOLIZATION;  Surgeon: Renford Dills, MD;  Location: [facility];  Service: Cardiovascular;  Laterality: Bilateral;   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   EYE SURGERY Right 09/2013   Corneal transplant   JOINT REPLACEMENT Right 2015   knee   REVERSE SHOULDER ARTHROPLASTY Left 10/13/2020   Procedure: REVERSE SHOULDER ARTHROPLASTY;  Surgeon: Christena Flake, MD;  Location: [facility] ORS;  Service: Orthopedics;  Laterality: Left;   SHOULDER ARTHROSCOPY WITH ROTATOR CUFF REPAIR Right 05/13/2018   Procedure: SHOULDER  ARTHROSCOPY WITH DEBRIDEMENT, DECOMPRESSION AND ROTATOR CUFF REPAIR;  Surgeon: Christena Flake, MD;  Location: [facility] ORS;  Service: Orthopedics;  Laterality: Right;   TONSILLECTOMY     Social History:  reports that she has never smoked. She has never used smokeless tobacco. She reports that she does not drink alcohol and does not use drugs.  Allergies  Allergen Reactions   Buspirone     Other Reaction(s): Dizziness   Propofol Anaphylaxis   Zolpidem Other (See Comments)    Hallucinations   Cholestyramine  Itching and Rash   Codeine Nausea Only   Hydrochlorothiazide Other (See Comments)    PATIENT DOES NOT REMEMBER   Loratadine Other (See Comments)    PATIENT DOES NOT REMEMBER   Niacin Rash    Family History  Problem Relation Age of Onset   Breast cancer Paternal Aunt     Prior to Admission medications   Medication Sig Start Date End Date Taking? Authorizing Provider  ALPRAZolam Prudy Feeler) 0.5 MG tablet Take 0.5 mg by mouth at bedtime as needed for anxiety or sleep.    [provider]  apixaban (ELIQUIS) 5 MG TABS tablet Take 2 tablets (10 mg total) by mouth 2 (two) times daily for 3 days, THEN 1 tablet (5 mg total) 2 (two) times daily for 27 days. 04/08/23 05/08/23  Alford Highland, MD  blood glucose meter kit and supplies KIT Dispense based on patient and insurance preference. Use up to four times daily as directed. (FOR ICD-9 250.00, 250.01). For QAC - HS accuchecks. 12/11/18   Leroy Sea, MD  Calcium Carb-Cholecalciferol (CALCIUM 600/VITAMIN D3 PO) Take 1 tablet by mouth daily.    [provider]  Coenzyme Q10 10 MG capsule Take 10 mg by mouth every morning.    [provider]  dapagliflozin propanediol (FARXIGA) 10 MG TABS tablet Take 10 mg by mouth daily. 11/03/20   [provider]  digoxin (LANOXIN) 0.125 MG tablet Take 1 tablet (0.125 mg total) by mouth every other day. 03/14/23 04/13/23  Charise Killian, MD  diltiazem (CARDIZEM CD) 360 MG 24 hr capsule Take 1 capsule (360 mg total) by mouth daily. 04/09/23   Alford Highland, MD  feeding supplement, GLUCERNA SHAKE, (GLUCERNA SHAKE) LIQD Take 237 mLs by mouth 3 (three) times daily between meals. 04/08/23   Alford Highland, MD  furosemide (LASIX) 20 MG tablet Take 1 tablet (20 mg total) by mouth daily. 04/09/23   Alford Highland, MD  gabapentin (NEURONTIN) 100 MG capsule Take 200 mg by mouth 3 (three) times daily.    [provider]  glucose blood (FREESTYLE LITE) test strip For  glucose testing every before meals at bedtime. Diagnosis E 11.65  Can substitute to any accepted brand 12/11/18   Leroy Sea, MD  insulin lispro (HUMALOG) 100 UNIT/ML injection Inject 7-19 Units into the skin 3 (three) times daily before meals. Blood Glucose level: 140-199 - 7 units, 200-250 - 9 units, 251-299 - 13 units,  300-349 - 17 units,  350 or above 19 units.    [provider]  Insulin Syringe-Needle U-100 25G X 1" 1 ML MISC For 4 times a day insulin SQ, 1 month supply. Diagnosis E11.65 12/11/18   Leroy Sea, MD  LANTUS SOLOSTAR 100 UNIT/ML Solostar Pen Inject 30 Units into the skin at bedtime. 04/08/23   Alford Highland, MD  magnesium oxide (MAG-OX) 400 MG tablet Take 800 mg by mouth 3 (three) times daily. 05/18/13   [provider]  metoprolol tartrate (LOPRESSOR) 50 MG tablet Take 1 tablet (50 mg total) by mouth 2 (two) times daily. 03/12/23 04/11/23  Charise Killian, MD  Multiple Vitamin (MULTIVITAMIN) tablet Take 1 tablet by mouth daily. Women's Daily Multivitamin    [provider]  pantoprazole (PROTONIX) 40 MG tablet Take 1 tablet (40 mg total) by mouth daily. 02/05/23   Jonah Blue, MD  PARoxetine (PAXIL) 10 MG tablet Take 10 mg by mouth daily. 02/22/23   [provider]  prednisoLONE acetate (PRED FORTE) 1 % ophthalmic suspension Place 1 drop into both eyes at bedtime. 10/04/17   [provider]  rOPINIRole (REQUIP) 1 MG tablet Take 1 mg by mouth at bedtime. 03/26/23   [provider]  rosuvastatin (CRESTOR) 10 MG tablet Take 10 mg by mouth daily.    [provider]  sodium chloride (OCEAN) 0.65 % SOLN nasal spray Place 2 sprays into both nostrils 4 (four) times daily. 04/08/23   Alford Highland, MD  vitamin B-12 (CYANOCOBALAMIN) 1000 MCG tablet Take 1,000 mcg by mouth daily.    [provider]    Physical Exam: Vitals:   05/11/23 1231 05/11/23 1232 05/11/23 1330  BP: 129/76  (!) 143/104   Pulse: 82  68  Resp: (!) 22  18  Temp: 98.4 F (36.9 C)    SpO2: (!) 88% 93% 93%  Weight: 80.7 kg    Height: 5\' 4"  (1.626 m)     Physical Exam Vitals and nursing note reviewed.  Constitutional:      General: She is not in acute distress.    Appearance: She is not toxic-appearing.  HENT:     Head: Normocephalic and atraumatic.  Neck:     Vascular: Hepatojugular reflux present. No JVD.  Cardiovascular:     Rate and Rhythm: Normal rate. Rhythm irregularly irregular.     Heart sounds: No murmur heard.    Comments: Trace bilateral pitting edema Pulmonary:     Effort: No tachypnea or accessory muscle usage.     Breath sounds: Decreased breath sounds (Bibasilar) and rales (Minimal) present. No wheezing or rhonchi.  Abdominal:     Palpations: Abdomen is soft.     Tenderness: There is no abdominal tenderness.  Musculoskeletal:     Cervical back: Neck supple.  Skin:    General: Skin is warm and dry.  Neurological:     General: No focal deficit present.     Mental Status: She is alert and oriented to person, place, and time.  Psychiatric:        Mood and Affect: Mood normal.        Behavior: Behavior normal.    Data Reviewed: CBC with WBC of 8.7, hemoglobin of 14.3, and platelets of 277 BMP with sodium of 135, potassium 3.2, bicarb 28, glucose 168, BUN 16, creatinine 0.84, GFR above 60 BNP 530 Troponin 11 COVID-19, influenza and RSV PCR negative  EKG personally reviewed.  Atrial fibrillation with rate of 87.  DG Chest 2 View Result Date: 05/11/2023 CLINICAL DATA:  Short of breath. Diagnosed with atrial fibrillation 3 weeks ago. Nonproductive cough. EXAM: CHEST - 2 VIEW COMPARISON:  Chest CT, 05/08/2023.  Chest radiographs, 03/28/2023. FINDINGS: Mild enlargement the cardiac silhouette. No mediastinal or hilar masses. No evidence of adenopathy. Small bilateral pleural effusions. Bilateral interstitial thickening most evident in the lower lungs. Additional posterior lung base  opacities are noted that are suspected to be atelectasis. No pneumothorax. Left reverse shoulder prosthesis appears well positioned and aligned  and unchanged IMPRESSION: Findings consistent with congestive heart failure with interstitial pulmonary edema and small bilateral pleural effusions. Electronically Signed   By: Amie Portland M.D.   On: 05/11/2023 13:13   Results are pending, will review when available.  Assessment and Plan:  * Acute hypoxic respiratory failure (HCC) Patient is presenting with acute hypoxic respiratory failure, suspected to be due to acute HFpEF exacerbation.  Viral workup is thus negative, however will obtain full viral panel given recent sinus congestion. No suspicion for bacterial pneumonia at this time, given no fever, leukocytosis.  - Continue supplemental oxygen to maintain oxygen saturation above 88% - Wean as tolerated - Full respiratory viral panel pending  Acute heart failure with preserved ejection fraction (HFpEF) (HCC) Patient is presenting with shortness of breath and evidence of elevated BNP and pulmonary edema consistent with acute HFpEF exacerbation.  Echocardiogram in October 2024 demonstrated preserved LVEF at 60-65% with moderate LVH and indeterminate diastolic function. Patient noted that she has seen her weight increase over the last few days; would benefit from further education on lasix use at home.   - Telemetry monitoring - S/p Lasix 60 mg once.  Continue Lasix 40 mg IV daily - Strict in and out - Daily weights - Daily BMP and magnesium  Chronic kidney disease, stage 3 unspecified (HCC) Renal function currently within normal limits.  Will monitor closely while diuresing.  - Repeat BMP in the a.m.  Type 2 diabetes mellitus (HCC) - Hold home regimen - SSI, moderate - Lantus 15 units at bedtime  Pulmonary embolism (HCC) Recent admission for PE complicated by epistaxis requiring embolization.  - Continue home Eliquis  Atrial  fibrillation (HCC) Rates well-controlled at this time.  - Continue home metoprolol, Eliquis  HTN (hypertension) - Continue home regimen  Advance Care Planning:   Code Status: Limited: Do not attempt resuscitation (DNR) -DNR-LIMITED -Do Not Intubate/DNI confirmed by patient at bedside  Consults: None  Family Communication: Patient's son updated at bedside  Severity of Illness: The appropriate patient status for this patient is INPATIENT. Inpatient status is judged to be reasonable and necessary in order to provide the required intensity of service to ensure the patient's safety. The patient's presenting symptoms, physical exam findings, and initial radiographic and laboratory data in the context of their chronic comorbidities is felt to place them at high risk for further clinical deterioration. Furthermore, it is not anticipated that the patient will be medically stable for discharge from the hospital within 2 midnights of admission.   * I certify that at the point of admission it is my clinical judgment that the patient will require inpatient hospital care spanning beyond 2 midnights from the point of admission due to high intensity of service, high risk for further deterioration and high frequency of surveillance required.*  Author: Verdene Lennert, MD 05/11/2023 3:39 PM  For on call review [email or web address].

## 2023-05-11 NOTE — ED Notes (Signed)
Pt set up to eat dinner.

## 2023-05-11 NOTE — Assessment & Plan Note (Signed)
Recent admission for PE complicated by epistaxis requiring embolization.  - Continue home Eliquis

## 2023-05-11 NOTE — Assessment & Plan Note (Signed)
-   Hold home regimen - SSI, moderate - Lantus 15 units at bedtime

## 2023-05-11 NOTE — Assessment & Plan Note (Signed)
Rates well-controlled at this time.  - Continue home metoprolol, Eliquis

## 2023-05-11 NOTE — Assessment & Plan Note (Signed)
-   Continue home regimen 

## 2023-05-11 NOTE — Assessment & Plan Note (Signed)
Renal function currently within normal limits.  Will monitor closely while diuresing.  - Repeat BMP in the a.m.

## 2023-05-11 NOTE — ED Provider Notes (Signed)
[facility] Provider Note    Event Date/Time   First MD Initiated Contact with Patient 05/11/23 1330     (approximate)  History   Chief Complaint: Chest Pain and Shortness of Breath  HPI  Samantha Freeman is a 88 y.o. female with a past history of anemia, anxiety, CKD, gastric reflux, hypertension, hyperlipidemia, presents to the emergency department for worsening shortness of breath.  According to the patient since this morning she has been experiencing worsening shortness of breath as well as chest pressure.  She states her last few days she has had a nonproductive cough as well.  Patient is prescribed 2 L of oxygen which she states she uses at night, patient requiring 4 L in the emergency department to maintain sats over 90% around 91 to 92%.  Patient denies any lower extremity edema denies any fever.  Physical Exam   Triage Vital Signs: ED Triage Vitals [05/11/23 1231]  Encounter Vitals Group     BP 129/76     Systolic BP Percentile      Diastolic BP Percentile      Pulse Rate 82     Resp (!) 22     Temp 98.4 F (36.9 C)     Temp src      SpO2 (!) 88 %     Weight 178 lb (80.7 kg)     Height 5\' 4"  (1.626 m)     Head Circumference      Peak Flow      Pain Score 0     Pain Loc      Pain Education      Exclude from Growth Chart     Most recent vital signs: Vitals:   05/11/23 1232 05/11/23 1330  BP:  (!) 143/104  Pulse:  68  Resp:  18  Temp:    SpO2: 93% 93%    General: Awake, no distress.  CV:  Good peripheral perfusion.  Regular rate and rhythm  Resp:  Normal effort.  Equal breath sounds bilaterally.  Abd:  No distention.  Soft, nontender.  No rebound or guarding. Other:  No lower extremity edema.   ED Results / Procedures / Treatments   EKG  EKG viewed and interpreted by myself shows atrial fibrillation at 87 bpm with a narrow QRS, right axis deviation, largely normal intervals and nonspecific ST changes.  RADIOLOGY  I have  reviewed and interpreted chest x-ray images.  No consolidation seen on my evaluation.  Possible left pleural effusion. Radiology is read the x-ray CHF with interstitial edema and bilateral small pleural effusions   MEDICATIONS ORDERED IN ED: Medications  sodium chloride flush (NS) 0.9 % injection 3 mL (3 mLs Intravenous Given 05/11/23 1542)  sodium chloride flush (NS) 0.9 % injection 3 mL (has no administration in time range)  0.9 %  sodium chloride infusion (has no administration in time range)  acetaminophen (TYLENOL) tablet 650 mg (has no administration in time range)  ondansetron (ZOFRAN) injection 4 mg (has no administration in time range)  furosemide (LASIX) injection 40 mg (has no administration in time range)  insulin aspart (novoLOG) injection 0-15 Units (has no administration in time range)  insulin glargine-yfgn (SEMGLEE) injection 15 Units (has no administration in time range)  furosemide (LASIX) injection 60 mg (60 mg Intravenous Given 05/11/23 1353)  potassium chloride SA (KLOR-CON M) CR tablet 40 mEq (40 mEq Oral Given 05/11/23 1538)     IMPRESSION / MDM / ASSESSMENT AND PLAN /  ED COURSE  I reviewed the triage vital signs and the nursing notes.  Patient's presentation is most consistent with acute presentation with potential threat to life or bodily function.  Patient presents to the emergency department for worsening shortness of breath and mild chest pressure.  Patient requiring 4 L nasal cannula oxygen to maintain sats over 90% which is increased from her baseline.  Patient's lab work shows a negative troponin, negative COVID/flu/RSV, reassuring CBC, reassuring chemistry.  BNP is elevated to 530.  Chest x-ray shows interstitial edema with bilateral small pleural effusions.  Highly suspect CHF exacerbation to be the cause of the patient's symptoms.  We will dose IV Lasix.  Will admit to the hospital service for further workup and treatment.  Patient agreeable to plan of  care.  FINAL CLINICAL IMPRESSION(S) / ED DIAGNOSES   CHF exacerbation    Note:  This document was prepared using Dragon voice recognition software and may include unintentional dictation errors.   Minna Antis, MD 05/11/23 [phone]

## 2023-05-11 NOTE — ED Triage Notes (Addendum)
Patient arrived by EMS from home. C/o chest pressure that started earlier today. Diagnosed with A-fib three weeks ago. Non productive cough present.   A&O x4  81% RA - 5L O2 93% 99.0 oral 191 CBG  EMS administered 324 aspirin 20G Left AC

## 2023-05-11 NOTE — Assessment & Plan Note (Addendum)
Patient is presenting with acute hypoxic respiratory failure, suspected to be due to acute HFpEF exacerbation.  Viral workup is thus negative, however will obtain full viral panel given recent sinus congestion. No suspicion for bacterial pneumonia at this time, given no fever, leukocytosis.  - Continue supplemental oxygen to maintain oxygen saturation above 88% - Wean as tolerated - Full respiratory viral panel pending

## 2023-05-11 NOTE — ED Notes (Signed)
Pt ambulated to restroom and returned to bed without incident. Gate was steady. NAD, CB within reach

## 2023-05-11 NOTE — Assessment & Plan Note (Addendum)
Patient is presenting with shortness of breath and evidence of elevated BNP and pulmonary edema consistent with acute HFpEF exacerbation.  Echocardiogram in October 2024 demonstrated preserved LVEF at 60-65% with moderate LVH and indeterminate diastolic function. Patient noted that she has seen her weight increase over the last few days; would benefit from further education on lasix use at home.   - Telemetry monitoring - S/p Lasix 60 mg once.  Continue Lasix 40 mg IV daily - Strict in and out - Daily weights - Daily BMP and magnesium

## 2023-05-11 NOTE — ED Notes (Signed)
New Oxygen tank hooked up for patient

## 2023-05-12 DIAGNOSIS — J9601 Acute respiratory failure with hypoxia: Secondary | ICD-10-CM | POA: Diagnosis not present

## 2023-05-12 LAB — CBC WITH DIFFERENTIAL/PLATELET
Abs Immature Granulocytes: 0.02 10*3/uL (ref 0.00–0.07)
Basophils Absolute: 0.1 10*3/uL (ref 0.0–0.1)
Basophils Relative: 1 %
Eosinophils Absolute: 0.5 10*3/uL (ref 0.0–0.5)
Eosinophils Relative: 6 %
HCT: 43.1 % (ref 36.0–46.0)
Hemoglobin: 13.5 g/dL (ref 12.0–15.0)
Immature Granulocytes: 0 %
Lymphocytes Relative: 30 %
Lymphs Abs: 2.3 10*3/uL (ref 0.7–4.0)
MCH: 29.5 pg (ref 26.0–34.0)
MCHC: 31.3 g/dL (ref 30.0–36.0)
MCV: 94.3 fL (ref 80.0–100.0)
Monocytes Absolute: 0.8 10*3/uL (ref 0.1–1.0)
Monocytes Relative: 11 %
Neutro Abs: 4 10*3/uL (ref 1.7–7.7)
Neutrophils Relative %: 52 %
Platelets: 247 10*3/uL (ref 150–400)
RBC: 4.57 MIL/uL (ref 3.87–5.11)
RDW: 15.3 % (ref 11.5–15.5)
WBC: 7.7 10*3/uL (ref 4.0–10.5)
nRBC: 0 % (ref 0.0–0.2)

## 2023-05-12 LAB — CBG MONITORING, ED
Glucose-Capillary: 172 mg/dL — ABNORMAL HIGH (ref 70–99)
Glucose-Capillary: 213 mg/dL — ABNORMAL HIGH (ref 70–99)
Glucose-Capillary: 187 mg/dL — ABNORMAL HIGH (ref 70–99)
Glucose-Capillary: 189 mg/dL — ABNORMAL HIGH (ref 70–99)

## 2023-05-12 LAB — BASIC METABOLIC PANEL
Anion gap: 12 (ref 5–15)
BUN: 18 mg/dL (ref 8–23)
CO2: 29 mmol/L (ref 22–32)
Calcium: 8.7 mg/dL — ABNORMAL LOW (ref 8.9–10.3)
Chloride: 95 mmol/L — ABNORMAL LOW (ref 98–111)
Creatinine, Ser: 0.83 mg/dL (ref 0.44–1.00)
GFR, Estimated: >60 mL/min (ref >60)
Glucose, Bld: 138 mg/dL — ABNORMAL HIGH (ref 70–99)
Potassium: 3 mmol/L — ABNORMAL LOW (ref 3.5–5.1)
Sodium: 136 mmol/L (ref 135–145)

## 2023-05-12 LAB — MAGNESIUM: Magnesium: 1.8 mg/dL (ref 1.7–2.4)

## 2023-05-12 MED ORDER — MAGNESIUM OXIDE -MG SUPPLEMENT 400 (240 MG) MG PO TABS
800.0000 mg | ORAL_TABLET | Freq: Every day | ORAL | Status: AC
  Administered 2023-05-12 – 2023-05-14 (×3): 800 mg via ORAL
  Filled 2023-05-12 (×3): qty 2

## 2023-05-12 MED ORDER — POTASSIUM CHLORIDE CRYS ER 20 MEQ PO TBCR
40.0000 meq | EXTENDED_RELEASE_TABLET | Freq: Four times a day (QID) | ORAL | Status: AC
  Administered 2023-05-12 (×2): 40 meq via ORAL
  Filled 2023-05-12 (×2): qty 2

## 2023-05-12 NOTE — Progress Notes (Signed)
Progress Note   Patient: Samantha Freeman WUJ:[id number] DOB: [birth year] DOA: 05/11/2023     1 DOS: the patient was seen and examined on 05/12/2023   Brief hospital course: 88 year old female admitted for acute hypoxic respiratory failure in the setting of acute HFpEF. Currently on 4 L.   Assessment and Plan: * Acute hypoxic respiratory failure secondary to CHF with preserved EF:  Continue supplemental oxygen to maintain oxygen saturation above 88% - Wean )2 as tolerated - Full respiratory viral panel negative  Acute heart failure with preserved ejection fraction (HFpEF) (HCC) Has elevated BNP and pulmonary edema on CXR Echocardiogram in October 2024 demonstrated preserved LVEF at 60-65% with moderate LVH and indeterminate diastolic function.  Telemetry monitoring Continue Lasix 40 mg IV daily F/u Strict in and out Daily weights F/u BMP and magnesium  Hypokalemia: Replace by PO supplement, recheck levels tomorrow Magnesium level at 1.8 : replace magnesium by PO supplement  H/o Chronic kidney disease, stage 3 unspecified (HCC) Renal function currently within normal limits.  Will monitor closely while diuresing. F/u BMP in the a.m.  Type 2 diabetes mellitus (HCC) SSI, moderate Lantus 15 units at bedtime  follow fingersticks  Pulmonary embolism (HCC) Recent admission for PE complicated by epistaxis requiring embolization. Continue home Eliquis  Atrial fibrillation [facility]): Rate moderately controlled around 100, Continue home metoprolol, Eliquis  HTN (hypertension): Controlled, continue home regimen        Subjective: Patient reported having shortness of breath, mild cough along with chest pressure Denies any fever, chills, nausea, vomiting, abdominal pain, leg pain Mild swelling in legs  Physical Exam: Vitals:   05/11/23 2325 05/12/23 0130 05/12/23 0530 05/12/23 0642  BP:  124/82 (!) 143/86 (!) 126/102  Pulse: (!) 101 (!) 101 97   Resp:  20 20 20   Temp:  98.1 F  (36.7 C) 98.3 F (36.8 C) 98.3 F (36.8 C)  TempSrc:      SpO2:  91% 90% 91%  Weight:      Height:       Physical Exam Constitutional:      Appearance: Normal appearance. She is ill-appearing.  HENT:     Head: Normocephalic and atraumatic.     Nose: Nose normal. No congestion.     Mouth/Throat:     Mouth: Mucous membranes are moist.     Pharynx: Oropharynx is clear. No oropharyngeal exudate.  Eyes:     Extraocular Movements: Extraocular movements intact.     Conjunctiva/sclera: Conjunctivae normal.  Cardiovascular:     Rate and Rhythm: Tachycardia present. Rhythm irregular.     Heart sounds: No murmur heard.    No friction rub.  Pulmonary:     Comments: Has significant bilateral crackles especially in the mid and lower lung fields Abdominal:     General: Abdomen is flat. Bowel sounds are normal.     Palpations: Abdomen is soft.  Musculoskeletal:        General: No tenderness. Normal range of motion.     Cervical back: Normal range of motion and neck supple.     Right lower leg: Edema present.     Left lower leg: Edema present.  Skin:    General: Skin is warm.     Capillary Refill: Capillary refill takes 2 to 3 seconds.     Findings: No erythema.  Neurological:     Mental Status: She is alert and oriented to person, place, and time.     Cranial Nerves: No cranial nerve deficit.  Sensory: No sensory deficit.     Motor: No weakness.  Psychiatric:        Mood and Affect: Mood normal.        Thought Content: Thought content normal.     Data Reviewed:  Latest Reference Range & Units 05/12/23 06:16  Sodium 135 - 145 mmol/L 136  Potassium 3.5 - 5.1 mmol/L 3.0 (L)  Chloride 98 - 111 mmol/L 95 (L)  CO2 22 - 32 mmol/L 29  Glucose 70 - 99 mg/dL 425 (H)  BUN 8 - 23 mg/dL 18  Creatinine 9.56 - 3.87 mg/dL 5.64  Calcium 8.9 - 33.2 mg/dL 8.7 (L)  Anion gap 5 - 15  12  Magnesium 1.7 - 2.4 mg/dL 1.8  GFR, Estimated >95 mL/min >60  WBC 4.0 - 10.5 K/uL 7.7  RBC 3.87 -  5.11 MIL/uL 4.57  Hemoglobin 12.0 - 15.0 g/dL 18.8  HCT 41.6 - 60.6 % 43.1  MCV 80.0 - 100.0 fL 94.3  MCH 26.0 - 34.0 pg 29.5  MCHC 30.0 - 36.0 g/dL 30.1  RDW 60.1 - 09.3 % 15.3  Platelets 150 - 400 K/uL 247  nRBC 0.0 - 0.2 % 0.0  Neutrophils % 52  Lymphocytes % 30  Monocytes Relative % 11  Eosinophil % 6  Basophil % 1  Immature Granulocytes % 0  NEUT# 1.7 - 7.7 K/uL 4.0  Lymphs Abs 0.7 - 4.0 K/uL 2.3  Monocyte # 0.1 - 1.0 K/uL 0.8  Eosinophils Absolute 0.0 - 0.5 K/uL 0.5  Basophils Absolute 0.0 - 0.1 K/uL 0.1  Abs Immature Granulocytes 0.00 - 0.07 K/uL 0.02  (L): Data is abnormally low (H): Data is abnormally high  Family Communication: Updated patient with plan of care  Disposition: Status is: Inpatient   Planned Discharge Destination: Home    Time spent: 35 minutes  Author: Ernestene Mention, MD 05/12/2023 8:56 AM  For on call review [email or web address].

## 2023-05-13 ENCOUNTER — Encounter: Payer: Self-pay | Admitting: Internal Medicine

## 2023-05-13 DIAGNOSIS — J9601 Acute respiratory failure with hypoxia: Secondary | ICD-10-CM | POA: Diagnosis not present

## 2023-05-13 LAB — BASIC METABOLIC PANEL
Anion gap: 10 (ref 5–15)
BUN: 19 mg/dL (ref 8–23)
CO2: 31 mmol/L (ref 22–32)
Calcium: 8.7 mg/dL — ABNORMAL LOW (ref 8.9–10.3)
Chloride: 95 mmol/L — ABNORMAL LOW (ref 98–111)
Creatinine, Ser: 0.87 mg/dL (ref 0.44–1.00)
GFR, Estimated: >60 mL/min (ref >60)
Glucose, Bld: 187 mg/dL — ABNORMAL HIGH (ref 70–99)
Potassium: 3.9 mmol/L (ref 3.5–5.1)
Sodium: 136 mmol/L (ref 135–145)

## 2023-05-13 LAB — MAGNESIUM: Magnesium: 1.9 mg/dL (ref 1.7–2.4)

## 2023-05-13 LAB — CBG MONITORING, ED
Glucose-Capillary: 186 mg/dL — ABNORMAL HIGH (ref 70–99)
Glucose-Capillary: 244 mg/dL — ABNORMAL HIGH (ref 70–99)
Glucose-Capillary: 175 mg/dL — ABNORMAL HIGH (ref 70–99)
Glucose-Capillary: 229 mg/dL — ABNORMAL HIGH (ref 70–99)

## 2023-05-13 NOTE — Progress Notes (Addendum)
Progress Note   Patient: Samantha Freeman ZOX:[id number] DOB: 03-Jun-1935 DOA: 05/11/2023     2 DOS: the patient was seen and examined on 05/13/2023   Brief hospital course: 88 year old female admitted for acute hypoxic respiratory failure in the setting of acute HFpEF. Currently on 4 L.   Assessment and Plan: * Acute hypoxic respiratory failure secondary to CHF with preserved EF:  Continue supplemental oxygen to maintain oxygen saturation above 88% - Wean O2 as tolerated - Full respiratory viral panel negative  Acute heart failure with preserved ejection fraction (HFpEF) (HCC) Has elevated BNP and pulmonary edema on CXR Echocardiogram in October 2024 demonstrated preserved LVEF at 60-65% with moderate LVH and indeterminate diastolic function.  Telemetry monitoring Continue Lasix 40 mg IV daily F/u Strict in and out Daily weights  Epistaxis: Resolved spontaneously   Hypokalemia: Replaced by PO supplement, resolved Magnesium level at 1.9  Type 2 diabetes mellitus (HCC) SSI, moderate Lantus 15 units at bedtime  follow fingersticks  Pulmonary embolism (HCC) Recent admission for PE complicated by epistaxis requiring embolization. Continue home Eliquis  Atrial fibrillation [facility]): Rate moderately controlled around 100, Continue home metoprolol, Eliquis  HTN (hypertension): Controlled, continue home regimen        Subjective: Patient reported bleeding from nose which was stopped spontaneously / with manual pressure before my evaluation.  Has shortness of breath, mild cough along with chest pressure  Denies any fever, chills, nausea, vomiting, abdominal pain, leg pain Mild swelling in legs    Physical Exam: Vitals:   05/13/23 0816 05/13/23 0817 05/13/23 1200 05/13/23 1202  BP:  (!) 128/94 (!) 135/98   Pulse:  (!) 101    Resp:  16 17   Temp: 98 F (36.7 C)   98.2 F (36.8 C)  TempSrc: Oral   Oral  SpO2:  93% 90%   Weight:      Height:       Physical  Exam Constitutional:      Appearance: Normal appearance. She is ill-appearing.  HENT:     Head: Normocephalic and atraumatic.     Nose: Nose normal. No congestion.     Mouth/Throat:     Mouth: Mucous membranes are moist.     Pharynx: Oropharynx is clear. No oropharyngeal exudate.  Eyes:     Extraocular Movements: Extraocular movements intact.     Conjunctiva/sclera: Conjunctivae normal.  Cardiovascular:     Rate and Rhythm: Tachycardia present. Rhythm irregular.     Heart sounds: No murmur heard.    No friction rub.  Pulmonary:     Breath sounds: Rales present.     Comments: Has significant bilateral crackles especially in the mid and lower lung fields Abdominal:     General: Abdomen is flat. Bowel sounds are normal.     Palpations: Abdomen is soft.  Musculoskeletal:        General: No tenderness. Normal range of motion.     Cervical back: Normal range of motion and neck supple.     Comments: Edema in both lower legs improving  Skin:    General: Skin is warm.     Capillary Refill: Capillary refill takes 2 to 3 seconds.     Findings: No erythema.  Neurological:     Mental Status: She is alert and oriented to person, place, and time.     Cranial Nerves: No cranial nerve deficit.     Sensory: No sensory deficit.     Motor: No weakness.  Psychiatric:  Mood and Affect: Mood normal.        Thought Content: Thought content normal.     Data Reviewed:   Latest Reference Range & Units 05/13/23 08:16  Sodium 135 - 145 mmol/L 136  Potassium 3.5 - 5.1 mmol/L 3.9  Chloride 98 - 111 mmol/L 95 (L)  CO2 22 - 32 mmol/L 31  Glucose 70 - 99 mg/dL 664 (H)  BUN 8 - 23 mg/dL 19  Creatinine 4.03 - 4.74 mg/dL 2.59  Calcium 8.9 - 56.3 mg/dL 8.7 (L)  Anion gap 5 - 15  10  Magnesium 1.7 - 2.4 mg/dL 1.9  GFR, Estimated >87 mL/min >60  (L): Data is abnormally low (H): Data is abnormally high  Family Communication: Updated patient with plan of care  Disposition: Status is:  Inpatient   Planned Discharge Destination: Home, Pt currently utilizes a rollator and a shower bench at home    Time spent: 35 minutes  Author: Ernestene Mention, MD 05/13/2023 12:25 PM  For on call review [email or web address].

## 2023-05-13 NOTE — TOC Progression Note (Signed)
Transition of Care [facility]) - Progression Note    Patient Details  Name: Samantha Freeman MRN: [id number] Date of Birth: Aug 20, 1935  Transition of Care [facility]) CM/SW Contact  Tory Emerald, [place] Phone Number: 05/13/2023, 11:19 AM  Clinical Narrative:     CSW spoke with Dorathy Daft at [facility] to inform her pt is currently in the ED.        Expected Discharge Plan and Services                                               Social Determinants of Health (SDOH) Interventions SDOH Screenings   Food Insecurity: No Food Insecurity (03/28/2023)  Housing: Unknown (04/29/2023)   Received from [facility]  Transportation Needs: No Transportation Needs (03/28/2023)  Utilities: Not At Risk (03/28/2023)  Tobacco Use: Low Risk  (05/11/2023)    Readmission Risk Interventions    05/13/2023   10:56 AM 03/28/2023    4:20 PM  Readmission Risk Prevention Plan  Transportation Screening Complete Complete  PCP or Specialist Appt within 3-5 Days Complete Complete  HRI or Home Care Consult Complete Complete  Social Work Consult for Recovery Care Planning/Counseling Complete Complete  Palliative Care Screening Not Applicable Not Applicable  Medication Review [employer]) Complete Complete

## 2023-05-13 NOTE — Progress Notes (Signed)
   05/13/23 1056  Readmission Prevention Plan - High Risk  Transportation Screening Complete  PCP or Specialist appointment within 5-7 days of discharge Complete  Home Care Consult (High Risk) Complete  High Risk Social Work Consult for recovery care planning/counseling (includes patient and caregiver) Complete  High Risk Palliative Care Screening Not Applicable  Medication Review Complete   CSW completed High Risk Readmission assessment w/ pt.   Admitted for: Heart Failure  Admitted from: Home alone  PCP: Dr. Judithann Sheen  Pharmacy: [facility]  Current home health/prior home health/DME: Pt currently utilizes a rollator and a shower bench.   Currently receiving HHS w/ [facility].

## 2023-05-14 DIAGNOSIS — J9601 Acute respiratory failure with hypoxia: Secondary | ICD-10-CM | POA: Diagnosis not present

## 2023-05-14 LAB — GLUCOSE, CAPILLARY
Glucose-Capillary: 211 mg/dL — ABNORMAL HIGH (ref 70–99)
Glucose-Capillary: 312 mg/dL — ABNORMAL HIGH (ref 70–99)
Glucose-Capillary: 282 mg/dL — ABNORMAL HIGH (ref 70–99)
Glucose-Capillary: 329 mg/dL — ABNORMAL HIGH (ref 70–99)

## 2023-05-14 MED ORDER — PHENYLEPHRINE HCL 0.25 % NA SOLN
1.0000 | Freq: Four times a day (QID) | NASAL | Status: AC | PRN
  Administered 2023-05-14: 1 via NASAL
  Filled 2023-05-14: qty 15

## 2023-05-14 MED ORDER — ROPINIROLE HCL 1 MG PO TABS
1.0000 mg | ORAL_TABLET | Freq: Every day | ORAL | Status: DC
  Administered 2023-05-14: 1 mg via ORAL
  Filled 2023-05-14: qty 1

## 2023-05-14 MED ORDER — PAROXETINE HCL 10 MG PO TABS
10.0000 mg | ORAL_TABLET | Freq: Every day | ORAL | Status: DC
  Administered 2023-05-14 – 2023-05-17 (×4): 10 mg via ORAL
  Filled 2023-05-14 (×4): qty 1

## 2023-05-14 NOTE — Progress Notes (Addendum)
Progress Note   Patient: Samantha Freeman [id number]:[id number] DOB: 12/13/1935 DOA: 05/11/2023     3 DOS: the patient was seen and examined on 05/14/2023   Brief hospital course: 88 year old female admitted for acute hypoxic respiratory failure in the setting of acute HFpEF. Currently down from 4 L--> 2LPM O2 [place].   Assessment and Plan: * Acute hypoxic respiratory failure secondary to CHF with preserved EF:  improving Currently requiring 2LPM O2 [place].  Continue supplemental oxygen to maintain oxygen saturation above 88% - Wean O2 as tolerated - Full respiratory viral panel negative  Acute heart failure with preserved ejection fraction (HFpEF) (HCC): improving Has elevated BNP and pulmonary edema on CXR Echocardiogram in October 2024 demonstrated preserved LVEF at 60-65% with moderate LVH and indeterminate diastolic function.  Telemetry monitoring Continue Lasix 40 mg IV daily F/u Strict in and out Daily weights  Epistaxis likely from nasal dryness: Resolved spontaneously Use Humidified oxygen Ordered Afrin nasal drops prn for today for any further recurrence  Hypokalemia: Replaced by PO supplement, resolved Magnesium level at 1.9  Type 2 diabetes mellitus (HCC) SSI, moderate Lantus 15 units at bedtime  follow fingersticks  Pulmonary embolism (HCC) Recent admission for PE complicated by epistaxis requiring embolization. Continue home Eliquis  Atrial fibrillation [facility]): Rate moderately controlled around 100, Continue home metoprolol, Eliquis  HTN (hypertension): Controlled, continue home regimen     Subjective: Patient reported bleeding from nose which was stopped spontaneously  Has shortness of breath, mild cough along with chest pressure  Denies any fever, chills, nausea, vomiting, abdominal pain, leg pain Mild swelling in legs    Physical Exam: Vitals:   05/14/23 0456 05/14/23 0500 05/14/23 0810 05/14/23 0907  BP: 115/75  115/82   Pulse: (!) 48  73   Resp: 19  18    Temp: 97.6 F (36.4 C)  98 F (36.7 C)   TempSrc: Oral     SpO2: 92%  94% (!) 89%  Weight:  76.3 kg    Height:       Physical Exam Constitutional:      Appearance: Normal appearance. She is ill-appearing.  HENT:     Head: Normocephalic and atraumatic.     Nose: Nose normal. No congestion.     Mouth/Throat:     Mouth: Mucous membranes are moist.     Pharynx: Oropharynx is clear. No oropharyngeal exudate.  Eyes:     Extraocular Movements: Extraocular movements intact.     Conjunctiva/sclera: Conjunctivae normal.  Cardiovascular:     Rate and Rhythm: Normal rate. Rhythm irregular.     Heart sounds: No murmur heard.    No friction rub.  Pulmonary:     Breath sounds: Rales present.     Comments: bilateral crackles in lower lung fields Abdominal:     General: Abdomen is flat. Bowel sounds are normal.     Palpations: Abdomen is soft.  Musculoskeletal:        General: No tenderness. Normal range of motion.     Cervical back: Normal range of motion and neck supple.     Comments: Edema in both lower legs improving  Skin:    General: Skin is warm.     Capillary Refill: Capillary refill takes 2 to 3 seconds.     Findings: No erythema.  Neurological:     Mental Status: She is alert and oriented to person, place, and time.     Cranial Nerves: No cranial nerve deficit.     Sensory: No sensory deficit.  Motor: No weakness.  Psychiatric:        Mood and Affect: Mood normal.        Thought Content: Thought content normal.     Data Reviewed:   Latest Reference Range & Units 05/13/23 08:16  Sodium 135 - 145 mmol/L 136  Potassium 3.5 - 5.1 mmol/L 3.9  Chloride 98 - 111 mmol/L 95 (L)  CO2 22 - 32 mmol/L 31  Glucose 70 - 99 mg/dL 132 (H)  BUN 8 - 23 mg/dL 19  Creatinine 4.40 - 1.02 mg/dL 7.25  Calcium 8.9 - 36.6 mg/dL 8.7 (L)  Anion gap 5 - 15  10  Magnesium 1.7 - 2.4 mg/dL 1.9  GFR, Estimated >44 mL/min >60  (L): Data is abnormally low (H): Data is abnormally  high  Family Communication: Updated patient and her son at bedside with plan of care  Disposition: Status is: Inpatient   Planned Discharge Destination: Home, Pt currently utilizes a rollator and a shower bench at home    Time spent: 35 minutes  Author: Ernestene Mention, MD 05/14/2023 10:55 AM  For on call review [email or web address].

## 2023-05-14 NOTE — Care Management Important Message (Signed)
Important Message  Patient Details  Name: Samantha Freeman MRN: [id number] Date of Birth: 06-06-1935   Important Message Given:  Yes - Medicare IM     Sherilyn Banker 05/14/2023, 12:26 PM

## 2023-05-14 NOTE — Inpatient Diabetes Management (Signed)
Inpatient Diabetes Program Recommendations  AACE/ADA: New Consensus Statement on Inpatient Glycemic Control (2015)  Target Ranges:  Prepandial:   less than 140 mg/dL      Peak postprandial:   less than 180 mg/dL (1-2 hours)      Critically ill patients:  140 - 180 mg/dL   Lab Results  Component Value Date   GLUCAP 312 (H) 05/14/2023   HGBA1C 7.6 (H) 01/31/2023    Review of Glycemic Control  Latest Reference Range & Units 05/13/23 08:01 05/13/23 11:53 05/13/23 16:35 05/13/23 21:29 05/14/23 08:10 05/14/23 12:08  Glucose-Capillary 70 - 99 mg/dL 401 (H) 027 (H) 253 (H) 229 (H) 211 (H) 312 (H)   Diabetes history: DM  Outpatient Diabetes medications:  Farxiga 10 mg daily Humalog 7-19 units tid with meals  Lantus 30 units daily Current orders for Inpatient glycemic control:  Semglee 15 units and q HS Novolog 0-15 units tid with meals  Inpatient Diabetes Program Recommendations:    Consider increasing Semglee to 20 units q HS.  Also consider adding Novolog meal coverage 3 units tid with meals (hold if patient eats less than 50% or NPO).    Thanks,  Lorenza Cambridge, RN, BC-ADM Inpatient Diabetes Coordinator Pager [phone]  (8a-5p)

## 2023-05-14 NOTE — Progress Notes (Incomplete)
Heart Failure Stewardship Pharmacy Note  PCP: Marguarite Arbour, MD PCP-Cardiologist: None  HPI: Samantha Freeman is a 88 y.o. female with  recent pulmonary embolism on Eliquis with epistaxis requiring embolization, atrial fibrillation on Eliquis, CHF, type 2 diabetes, hypertension, hyperlipidemia, CKD stage IIIa, depression/anxiety who presented with fatigue and shortness of breath. BNP on admission was 530.5. HS-troponin on admission was 11.   Pertinent cardiac history: LVEF in 01/2023 was 60-65% with indeterminate diastolic dysfunction.   Pertinent Lab Values: Creatinine  Date Value Ref Range Status  02/15/2012 0.87 0.60 - 1.30 mg/dL Final   Creatinine, Ser  Date Value Ref Range Status  05/13/2023 0.87 0.44 - 1.00 mg/dL Final   BUN  Date Value Ref Range Status  05/13/2023 19 8 - 23 mg/dL Final  52/84/1324 14 7 - 18 mg/dL Final   Potassium  Date Value Ref Range Status  05/13/2023 3.9 3.5 - 5.1 mmol/L Final  02/15/2012 3.7 3.5 - 5.1 mmol/L Final   Sodium  Date Value Ref Range Status  05/13/2023 136 135 - 145 mmol/L Final  02/15/2012 140 136 - 145 mmol/L Final   B Natriuretic Peptide  Date Value Ref Range Status  05/11/2023 530.5 (H) 0.0 - 100.0 pg/mL Final    Comment:    Performed at [facility], [address] [place], [place] [address]   Magnesium  Date Value Ref Range Status  05/13/2023 1.9 1.7 - 2.4 mg/dL Final    Comment:    Performed at [facility], [address] [place], [place] [address]   Hgb A1c MFr Bld  Date Value Ref Range Status  01/31/2023 7.6 (H) 4.8 - 5.6 % Final    Comment:    (NOTE) Pre diabetes:          5.7%-6.4%  Diabetes:              >6.4%  Glycemic control for   <7.0% adults with diabetes    Digoxin Level  Date Value Ref Range Status  03/28/2023 <0.2 (L) 0.8 - 2.0 ng/mL Corrected    Comment:    RESULTS CONFIRMED BY MANUAL DILUTION SS Performed at [facility], [address] [place],  [place] [address] CORRECTED ON 12/06 AT 0316: PREVIOUSLY REPORTED AS 2.6 RESULT CONFIRMED BY MANUAL DILUTION SS CRITICAL RESULT CALLED TO, READ BACK BY AND VERIFIED WITH JENNIFER NONN AT 1701 ON 03/28/23 BY SS    TSH  Date Value Ref Range Status  01/30/2023 4.844 (H) 0.350 - 4.500 uIU/mL Final    Comment:    Performed by a 3rd Generation assay with a functional sensitivity of <=0.01 uIU/mL. Performed at [facility], [address] [place], [place] [address]    LDH  Date Value Ref Range Status  12/11/2018 239 (H) 98 - 192 U/L Final    Comment:    Performed at [facility], [address], [place], [place] [address]    Vital Signs: Admission weight: Temp:  [97.6 F (36.4 C)-98.3 F (36.8 C)] 97.6 F (36.4 C) (01/21 0456) Pulse Rate:  [48-101] 48 (01/21 0456) Resp:  [16-20] 19 (01/21 0456) BP: (111-143)/(62-98) 115/75 (01/21 0456) SpO2:  [90 %-94 %] 92 % (01/21 0456) Weight:  [76.3 kg (168 lb 3.4 oz)] 76.3 kg (168 lb 3.4 oz) (01/21 0500) No intake or output data in the 24 hours ending 05/14/23 0647  Current Heart Failure Medications:  Loop diuretic: Beta-Blocker: ACEI/ARB/ARNI: MRA: SGLT2i: Other:  Prior to admission Heart Failure Medications:  Loop diuretic: furosemide 20 mg daily Beta-Blocker: metoprolol tartrate 50 mg BID ACEI/ARB/ARNI: none MRA: none SGLT2i: Farxiga 10 mg daily Other: diltiazem ER 180 mg daily, digoxin 0.125 mg daily  Assessment: 1. Acute on chronic diastolic heart failure (LVEF 60-65%)  , due to presumed NICM. NYHA class *** symptoms.  -Symptoms: -Volume: -Hemodynamics: -SGLT2i: -MRA: -ARNI:   Plan: 1) Medication changes recommended at this time:  2) Patient assistance:   3) Education: -To be completed prior to discharge.  *** Medication Assistance / Insurance Benefits Check: Does the patient have prescription insurance?    Type of insurance plan:  Does the patient qualify for medication assistance through  manufacturers or grants? {CHL AMB Yes/No/Pending:[id number]}  Eligible grants and/or patient assistance programs: ***  Medication assistance applications in progress: ***  Medication assistance applications approved: *** Approved medication assistance renewals will be completed by: ***  Outpatient Pharmacy: Prior to admission outpatient pharmacy: ***      ***

## 2023-05-14 NOTE — Progress Notes (Signed)
Heart Failure Navigator Progress Note  Assessed for Heart & Vascular TOC clinic readiness.  Patient does not meet criteria due to current [facility] patient of Dr. Marcina Millard, MD.   Navigator will sign off at this time.  Roxy Horseman, RN, BSN [facility] Heart Failure Navigator Secure Chat Only

## 2023-05-14 NOTE — Progress Notes (Signed)
Patient having a nosebleed - requesting some nasal spray.  Sent secure chat to MD.

## 2023-05-14 NOTE — Progress Notes (Signed)
Patient turned down to [facility] humidified oxygen.  Nasal spray given to patient as well.

## 2023-05-14 NOTE — Plan of Care (Signed)
  Problem: Elimination: Goal: Will not experience complications related to urinary retention Outcome: Progressing   Problem: Elimination: Goal: Will not experience complications related to bowel motility Outcome: Progressing   Problem: Pain Managment: Goal: General experience of comfort will improve and/or be controlled Outcome: Progressing   Problem: Safety: Goal: Ability to remain free from injury will improve Outcome: Progressing   Problem: Coping: Goal: Level of anxiety will decrease Outcome: Progressing

## 2023-05-15 DIAGNOSIS — J9601 Acute respiratory failure with hypoxia: Secondary | ICD-10-CM | POA: Diagnosis not present

## 2023-05-15 DIAGNOSIS — E1122 Type 2 diabetes mellitus with diabetic chronic kidney disease: Secondary | ICD-10-CM | POA: Diagnosis not present

## 2023-05-15 DIAGNOSIS — I5031 Acute diastolic (congestive) heart failure: Secondary | ICD-10-CM | POA: Diagnosis not present

## 2023-05-15 DIAGNOSIS — N1831 Chronic kidney disease, stage 3a: Secondary | ICD-10-CM | POA: Diagnosis not present

## 2023-05-15 LAB — BASIC METABOLIC PANEL
Anion gap: 13 (ref 5–15)
BUN: 20 mg/dL (ref 8–23)
CO2: 29 mmol/L (ref 22–32)
Calcium: 9 mg/dL (ref 8.9–10.3)
Chloride: 93 mmol/L — ABNORMAL LOW (ref 98–111)
Creatinine, Ser: 0.86 mg/dL (ref 0.44–1.00)
GFR, Estimated: >60 mL/min (ref >60)
Glucose, Bld: 307 mg/dL — ABNORMAL HIGH (ref 70–99)
Potassium: 3.7 mmol/L (ref 3.5–5.1)
Sodium: 135 mmol/L (ref 135–145)

## 2023-05-15 LAB — GLUCOSE, CAPILLARY
Glucose-Capillary: 313 mg/dL — ABNORMAL HIGH (ref 70–99)
Glucose-Capillary: 388 mg/dL — ABNORMAL HIGH (ref 70–99)
Glucose-Capillary: 176 mg/dL — ABNORMAL HIGH (ref 70–99)
Glucose-Capillary: 220 mg/dL — ABNORMAL HIGH (ref 70–99)

## 2023-05-15 LAB — MAGNESIUM: Magnesium: 1.8 mg/dL (ref 1.7–2.4)

## 2023-05-15 MED ORDER — DAPAGLIFLOZIN PROPANEDIOL 10 MG PO TABS
10.0000 mg | ORAL_TABLET | Freq: Every day | ORAL | Status: DC
  Administered 2023-05-15 – 2023-05-18 (×4): 10 mg via ORAL
  Filled 2023-05-15 (×5): qty 1

## 2023-05-15 MED ORDER — INSULIN ASPART 100 UNIT/ML IJ SOLN
3.0000 [IU] | Freq: Three times a day (TID) | INTRAMUSCULAR | Status: DC
  Administered 2023-05-15 – 2023-05-16 (×5): 3 [IU] via SUBCUTANEOUS
  Filled 2023-05-15 (×5): qty 1

## 2023-05-15 MED ORDER — SPIRONOLACTONE 25 MG PO TABS
25.0000 mg | ORAL_TABLET | Freq: Every day | ORAL | Status: DC
  Administered 2023-05-15 – 2023-05-18 (×4): 25 mg via ORAL
  Filled 2023-05-15 (×4): qty 1

## 2023-05-15 MED ORDER — FUROSEMIDE 10 MG/ML IJ SOLN
40.0000 mg | Freq: Two times a day (BID) | INTRAMUSCULAR | Status: DC
  Administered 2023-05-15 – 2023-05-17 (×5): 40 mg via INTRAVENOUS
  Filled 2023-05-15 (×5): qty 4

## 2023-05-15 MED ORDER — ROPINIROLE HCL 1 MG PO TABS
1.0000 mg | ORAL_TABLET | Freq: Every day | ORAL | Status: DC | PRN
  Administered 2023-05-15 – 2023-05-17 (×3): 1 mg via ORAL
  Filled 2023-05-15 (×3): qty 1

## 2023-05-15 MED ORDER — INSULIN GLARGINE-YFGN 100 UNIT/ML ~~LOC~~ SOLN
30.0000 [IU] | Freq: Every day | SUBCUTANEOUS | Status: DC
  Administered 2023-05-15: 30 [IU] via SUBCUTANEOUS
  Filled 2023-05-15 (×2): qty 0.3

## 2023-05-15 NOTE — Plan of Care (Signed)

## 2023-05-15 NOTE — Progress Notes (Signed)
Heart Failure Stewardship Pharmacy Note  PCP: Marguarite Arbour, MD PCP-Cardiologist: None  HPI: Samantha Freeman is a 89 y.o. female with  recent pulmonary embolism on Eliquis with epistaxis requiring embolization, atrial fibrillation on Eliquis, CHF, type 2 diabetes, hypertension, hyperlipidemia, CKD stage IIIa, depression/anxiety who presented with fatigue and shortness of breath. BNP on admission was 530.5. HS-troponin on admission was 11.   Pertinent cardiac history: LVEF in 01/2023 was 60-65% with indeterminate diastolic dysfunction.   Pertinent Lab Values: Creatinine  Date Value Ref Range Status  02/15/2012 0.87 0.60 - 1.30 mg/dL Final   Creatinine, Ser  Date Value Ref Range Status  05/15/2023 0.86 0.44 - 1.00 mg/dL Final   BUN  Date Value Ref Range Status  05/15/2023 20 8 - 23 mg/dL Final  21/30/8657 14 7 - 18 mg/dL Final   Potassium  Date Value Ref Range Status  05/15/2023 3.7 3.5 - 5.1 mmol/L Final  02/15/2012 3.7 3.5 - 5.1 mmol/L Final   Sodium  Date Value Ref Range Status  05/15/2023 135 135 - 145 mmol/L Final  02/15/2012 140 136 - 145 mmol/L Final   B Natriuretic Peptide  Date Value Ref Range Status  05/11/2023 530.5 (H) 0.0 - 100.0 pg/mL Final    Comment:    Performed at [facility], [address] [place], [place] [address]   Magnesium  Date Value Ref Range Status  05/15/2023 1.8 1.7 - 2.4 mg/dL Final    Comment:    Performed at [facility], [address] [place], [place] [address]   Hgb A1c MFr Bld  Date Value Ref Range Status  01/31/2023 7.6 (H) 4.8 - 5.6 % Final    Comment:    (NOTE) Pre diabetes:          5.7%-6.4%  Diabetes:              >6.4%  Glycemic control for   <7.0% adults with diabetes    Digoxin Level  Date Value Ref Range Status  03/28/2023 <0.2 (L) 0.8 - 2.0 ng/mL Corrected    Comment:    RESULTS CONFIRMED BY MANUAL DILUTION SS Performed at [facility], [address] [place], [place]  [address] CORRECTED ON 12/06 AT 0316: PREVIOUSLY REPORTED AS 2.6 RESULT CONFIRMED BY MANUAL DILUTION SS CRITICAL RESULT CALLED TO, READ BACK BY AND VERIFIED WITH JENNIFER NONN AT 1701 ON 03/28/23 BY SS    TSH  Date Value Ref Range Status  01/30/2023 4.844 (H) 0.350 - 4.500 uIU/mL Final    Comment:    Performed by a 3rd Generation assay with a functional sensitivity of <=0.01 uIU/mL. Performed at [facility], [address] [place], [place] [address]    LDH  Date Value Ref Range Status  12/11/2018 239 (H) 98 - 192 U/L Final    Comment:    Performed at [facility], [address] [place], [place] [address]    Vital Signs:  Temp:  [97.7 F (36.5 C)-98.5 F (36.9 C)] 97.7 F (36.5 C) (01/22 0450) Pulse Rate:  [50-109] 63 (01/22 0450) Cardiac Rhythm: Atrial fibrillation (01/21 1902) Resp:  [16-20] 20 (01/22 0450) BP: (109-144)/(65-95) 122/65 (01/22 0450) SpO2:  [89 %-94 %] 91 % (01/22 0450)  Intake/Output Summary (Last 24 hours) at 05/15/2023 0726 Last data filed at 05/15/2023 0100 Gross per 24 hour  Intake 783 ml  Output 1000 ml  Net -217 ml    Current Heart Failure Medications:  Loop diuretic: furosemide 40  mg IV BID Beta-Blocker: metoprolol tartrate 25 mg BID ACEI/ARB/ARNI: none MRA: none SGLT2i: none Other: iltiazem ER 360 mg daily, digoxin 0.125 mg daily  Prior to admission Heart Failure Medications:  Loop diuretic: furosemide 20 mg daily Beta-Blocker: metoprolol tartrate 50 mg BID ACEI/ARB/ARNI: none MRA: none SGLT2i: Farxiga 10 mg daily Other: diltiazem ER 180 mg daily, digoxin 0.125 mg daily  Assessment: 1. Acute on chronic diastolic heart failure (LVEF 60-65%)  , due to presumed NICM. NYHA class III-IV symptoms.  -Symptoms: Reports shortness of breath is relatively unchanged. Feels generally fatigued. Reports chest pain "when pressing on it." -Volume: Volume is difficult to assess. Likely hypervolemic. Agree with  diuresis. -Hemodynamics: BP has been stable with most recent reading elevated. -SGLT2i: Not an ideal candidate given age and mobility. Can consider outpatient based on provider-patient conversation. -MRA: Consider starting spironolactone 12.5 mg daily for HFpEF. This will also maintain normokalemia and provide additional diuresis. -ARNI: BP too soft at this time.  Plan: 1) Medication changes recommended at this time: -Consider starting spironolactone 12.5 mg daily  2) Patient assistance: -Pending  3) Education: - Patient has been educated on current HF medications and potential additions to HF medication regimen - Patient verbalizes understanding that over the next few months, these medication doses may change and more medications may be added to optimize HF regimen - Patient has been educated on basic disease state pathophysiology and goals of therapy  Medication Assistance / Insurance Benefits Check: Does the patient have prescription insurance?    Type of insurance plan:  Does the patient qualify for medication assistance through manufacturers or grants? Pending   Outpatient Pharmacy: Prior to admission outpatient pharmacy: [facility]     Please do not hesitate to reach out with questions or concerns,  Enos Fling, PharmD, CPP, BCPS Heart Failure Pharmacist  Phone - [phone] 05/15/2023 10:03 AM

## 2023-05-15 NOTE — Hospital Course (Signed)
Samantha Freeman is a 88 y.o. female with medical history significant of recent pulmonary embolism on Eliquis, atrial fibrillation on Eliquis, CHF, type 2 diabetes, hypertension, hyperlipidemia, CKD stage IIIa, depression/anxiety who presents to the ED 2/2 shortness of breathe.  Patient is diagnosed with acute on chronic diastolic congestive heart failure, was started on IV Lasix.

## 2023-05-15 NOTE — Progress Notes (Signed)
Progress Note   Patient: Samantha Freeman YQI:[id number] DOB: 02/25/1936 DOA: 05/11/2023     4 DOS: the patient was seen and examined on 05/15/2023   Brief Freeman course: Samantha Freeman is a 88 y.o. female with medical history significant of recent pulmonary embolism on Eliquis, atrial fibrillation on Eliquis, CHF, type 2 diabetes, hypertension, hyperlipidemia, CKD stage IIIa, depression/anxiety who presents to the ED 2/2 shortness of breathe.  Patient is diagnosed with acute on chronic diastolic congestive heart failure, was started on IV Lasix.   Principal Problem:   Acute hypoxic respiratory failure (HCC) Active Problems:   Acute heart failure with preserved ejection fraction (HFpEF) (HCC)   HTN (hypertension)   Atrial fibrillation (HCC)   Pulmonary embolism (HCC)   Type 2 diabetes mellitus (HCC)   Chronic kidney disease, stage 3 unspecified (HCC)   Assessment and Plan: Acute hypoxic respiratory failure secondary to CHF with preserved EF:   Acute heart failure with preserved ejection fraction (HFpEF) (HCC): improving Has elevated BNP and pulmonary edema on CXR Echocardiogram in October 2024 demonstrated preserved LVEF at 60-65% with moderate LVH and indeterminate diastolic function.  Patient was treated with Lasix 40 mg IV daily, still has significant volume overload, increased to 40 mg twice a day.  Monitor electrolytes and renal function.  Also added Aldactone.  Samantha Freeman. Patient had 4 admissions in the last 4 months with diagnosis of congestive heart failure exacerbation.  Patient admitted drinking excessive water, 32 ounces x 3 times a day.  Advised patient to reduce water intake to less than a half a gallon a day.   Epistaxis likely from nasal dryness: Resolved spontaneously Use Humidified oxygen Resolved.   Hypokalemia: Replaced by PO supplement, resolved Magnesium level at 1.9   Type 2 diabetes mellitus (HCC) uncontrolled with hyperglycemia. Was running high above 300s,  increase insulin glargine to 30 units every evening, added scheduled insulin in addition to sliding scale insulin.   Pulmonary embolism (HCC) Recent admission for PE complicated by epistaxis requiring embolization. Continue home Eliquis   Chronic atrial fibrillation Samantha Freeman): Rate moderately controlled around 100, Continue home metoprolol, Eliquis   HTN (hypertension): Controlled, continue home regimen  Chronic kidney disease stage II, not stage III. Reviewed the chart, patient GFR has always been above 60.      Subjective:  Still complain short of breath with duration, cough, nonproductive.  Physical Exam: Vitals:   05/15/23 0006 05/15/23 0450 05/15/23 0805 05/15/23 1152  BP: 109/68 122/65 (!) 145/71   Pulse: 72 63 61   Resp: 18 20 14    Temp: 98.5 F (36.9 C) 97.7 F (36.5 C) (!) 97.5 F (36.4 C)   TempSrc:      SpO2: 92% 91% 93% 90%  Weight:   77.4 kg   Height:       General exam: Appears calm and comfortable  Respiratory system: Crackles in the bases bilaterally. Respiratory effort normal. Cardiovascular system: Irregular. No JVD, murmurs, rubs, gallops or clicks. No pedal edema. Gastrointestinal system: Abdomen is nondistended, soft and nontender. No organomegaly or masses felt. Normal bowel sounds heard. Central nervous system: Alert and oriented. No focal neurological deficits. Extremities: Symmetric 5 x 5 power. Skin: No rashes, lesions or ulcers Psychiatry: Judgement and insight appear normal. Mood & affect appropriate.    Data Reviewed:  Lab results reviewed.  Family Communication: Son updated over the phone.  Disposition: Status is: Inpatient Remains inpatient appropriate because: Severity of disease, IV treatment     Time spent: 50 minutes  Author: Marrion Coy, MD 05/15/2023 1:43 PM  For on call review [email or web address].

## 2023-05-15 NOTE — Evaluation (Signed)
Physical Therapy Evaluation Patient Details Name: Samantha Freeman MRN: [id number] DOB: 1936/03/15 Today's Date: 05/15/2023  History of Present Illness  Patient is an 88 year old female with recent PE and a fib on Eliquis,  who presented to ED secondary to shortness of breath. Marland Kitchen PMHx: atrial fibrillation not on AC, HFpEF, type 2 diabetes, hypertension, hyperlipidemia, CKD stage IIIa, depression/anxiety presented acute respiratory failure hypoxia, A-fib with RVR, and acute on chronic HFpEF.   Clinical Impression  Patient is a very pleasant 88 year old female who presents with weakness. Patient was recently admitted frequently in the past few months. She lives alone but her children do help with the grocery shopping. She has a single level home and utilizes a rollator for mobility at baseline.  Upon PT arrival patient is in bed and agreeable to participate with therapy. She is able to transition to EOB with supervision as well as to standing. Vitals monitored during ambulation with SP02 dropping to 90 on 2L of 02 via nasal cannula and HR raising to 134, however these returned to a more normal range with rest. Patient fatigued quickly and was returned to room where she was set up for lunch with lunch arriving as PT completed. Patient set up with all needs met.   Pt would benefit from skilled PT to address noted impairments and functional limitations (see below for any additional details).           If plan is discharge home, recommend the following: A little help with bathing/dressing/bathroom;Assistance with cooking/housework;Assist for transportation   Can travel by private vehicle        Equipment Recommendations BSC/3in1  Recommendations for Other Services       Functional Status Assessment Patient has had a recent decline in their functional status and demonstrates the ability to make significant improvements in function in a reasonable and predictable amount of time.     Precautions /  Restrictions Precautions Precautions: Fall Restrictions Weight Bearing Restrictions Per Provider Order: No      Mobility  Bed Mobility Overal bed mobility: Modified Independent             General bed mobility comments: Increased time to get EOB with supervision    Transfers Overall transfer level: Modified independent Equipment used: Rolling walker (2 wheels)               General transfer comment: Supervision/CGA    Ambulation/Gait Ambulation/Gait assistance: Supervision, Contact guard assist Gait Distance (Feet): 80 Feet Assistive device: Rolling walker (2 wheels) Gait Pattern/deviations: Decreased stride length, Shuffle, Trunk flexed       General Gait Details: slow shuffle steppage.  Stairs            Wheelchair Mobility     Tilt Bed    Modified Rankin (Stroke Patients Only)       Balance Overall balance assessment: Needs assistance Sitting-balance support: No upper extremity supported, Feet supported Sitting balance-Leahy Scale: Good Sitting balance - Comments: able to don house coat without assistance   Standing balance support: Bilateral upper extremity supported, During functional activity Standing balance-Leahy Scale: Fair Standing balance comment: Requires use of UE support on device or table to ambulate. Able to static stand without UE support to adjust housecoat                             Pertinent Vitals/Pain Pain Assessment Pain Assessment: No/denies pain (does report that she sometimes gets  chest pain but states she hasn't had it in a bit.)    Home Living Family/patient expects to be discharged to:: Private residence Living Arrangements: Alone Available Help at Discharge: Family;Available PRN/intermittently Type of Home: House Home Access: Level entry       Home Layout: One level Home Equipment: Cane - single point;Standard Environmental consultant;Shower seat - built in;Grab bars - tub/shower;Hand held Nurse, mental health (4 wheels) Additional Comments: Uses a rollator and/or "wall/furniture surfs"    Prior Function Prior Level of Function : Independent/Modified Independent             Mobility Comments: Patient uses walker at baseline. Her son helps with groceries and some meal prep. Has not driven in a while due to feeling weak. ADLs Comments: Reports her son helps with some light meal prep, started to get Home Health     Extremity/Trunk Assessment   Upper Extremity Assessment Upper Extremity Assessment: Defer to OT evaluation    Lower Extremity Assessment Lower Extremity Assessment: Generalized weakness;RLE deficits/detail;LLE deficits/detail RLE Deficits / Details: grossly 4-/5 RLE Sensation: WNL RLE Coordination: WNL LLE Deficits / Details: grossly 3+/5 LLE Sensation: WNL    Cervical / Trunk Assessment Cervical / Trunk Assessment: Normal  Communication   Communication Communication: No apparent difficulties  Cognition Arousal: Alert Behavior During Therapy: WFL for tasks assessed/performed Overall Cognitive Status: Within Functional Limits for tasks assessed                                          General Comments General comments (skin integrity, edema, etc.): patient apears well nourished and groomed.    Exercises Other Exercises Other Exercises: Patient educated on role of PT in acute care setting.   Assessment/Plan    PT Assessment Patient needs continued PT services  PT Problem List Decreased strength;Decreased activity tolerance;Decreased mobility;Decreased balance;Cardiopulmonary status limiting activity       PT Treatment Interventions DME instruction;Gait training;Stair training;Functional mobility training;Neuromuscular re-education;Balance training;Therapeutic exercise;Therapeutic activities;Patient/family education;Manual techniques    PT Goals (Current goals can be found in the Care Plan section)  Acute Rehab PT Goals Patient  Stated Goal: to return home PT Goal Formulation: With patient Time For Goal Achievement: 05/29/23 Potential to Achieve Goals: Fair    Frequency Min 1X/week     Co-evaluation               AM-PAC PT "6 Clicks" Mobility  Outcome Measure Help needed turning from your back to your side while in a flat bed without using bedrails?: A Little Help needed moving from lying on your back to sitting on the side of a flat bed without using bedrails?: A Little Help needed moving to and from a bed to a chair (including a wheelchair)?: A Little Help needed standing up from a chair using your arms (e.g., wheelchair or bedside chair)?: A Little Help needed to walk in hospital room?: A Little Help needed climbing 3-5 steps with a railing? : A Lot 6 Click Score: 17    End of Session Equipment Utilized During Treatment: Gait belt;Oxygen (2 L via nasal cannula) Activity Tolerance: Patient tolerated treatment well;Patient limited by fatigue Patient left: in chair;with call bell/phone within reach;Other (comment) (lunch being served) Nurse Communication: Mobility status PT Visit Diagnosis: Unsteadiness on feet (R26.81);Muscle weakness (generalized) (M62.81);Difficulty in walking, not elsewhere classified (R26.2)    Time: 1027-2536 PT Time Calculation (min) (ACUTE ONLY):  18 min   Charges:   PT Evaluation $PT Eval Moderate Complexity: 1 Mod   PT General Charges $$ ACUTE PT VISIT: 1 Visit         Precious Bard, PT, DPT Physical Therapist - [place]   05/15/2023, 11:56 AM

## 2023-05-16 ENCOUNTER — Ambulatory Visit (INDEPENDENT_AMBULATORY_CARE_PROVIDER_SITE_OTHER): Payer: Medicare Other | Admitting: Vascular Surgery

## 2023-05-16 DIAGNOSIS — J9601 Acute respiratory failure with hypoxia: Secondary | ICD-10-CM | POA: Diagnosis not present

## 2023-05-16 DIAGNOSIS — I4891 Unspecified atrial fibrillation: Secondary | ICD-10-CM | POA: Diagnosis not present

## 2023-05-16 DIAGNOSIS — I5031 Acute diastolic (congestive) heart failure: Secondary | ICD-10-CM | POA: Diagnosis not present

## 2023-05-16 LAB — GLUCOSE, CAPILLARY
Glucose-Capillary: 194 mg/dL — ABNORMAL HIGH (ref 70–99)
Glucose-Capillary: 282 mg/dL — ABNORMAL HIGH (ref 70–99)
Glucose-Capillary: 352 mg/dL — ABNORMAL HIGH (ref 70–99)
Glucose-Capillary: 309 mg/dL — ABNORMAL HIGH (ref 70–99)

## 2023-05-16 LAB — BASIC METABOLIC PANEL
Anion gap: 13 (ref 5–15)
BUN: 24 mg/dL — ABNORMAL HIGH (ref 8–23)
CO2: 32 mmol/L (ref 22–32)
Calcium: 9.2 mg/dL (ref 8.9–10.3)
Chloride: 93 mmol/L — ABNORMAL LOW (ref 98–111)
Creatinine, Ser: 0.8 mg/dL (ref 0.44–1.00)
GFR, Estimated: >60 mL/min (ref >60)
Glucose, Bld: 208 mg/dL — ABNORMAL HIGH (ref 70–99)
Potassium: 3.4 mmol/L — ABNORMAL LOW (ref 3.5–5.1)
Sodium: 138 mmol/L (ref 135–145)

## 2023-05-16 LAB — DIGOXIN LEVEL: Digoxin Level: 0.5 ng/mL — ABNORMAL LOW (ref 0.8–2.0)

## 2023-05-16 LAB — MAGNESIUM: Magnesium: 1.6 mg/dL — ABNORMAL LOW (ref 1.7–2.4)

## 2023-05-16 MED ORDER — INSULIN ASPART 100 UNIT/ML IJ SOLN
4.0000 [IU] | Freq: Three times a day (TID) | INTRAMUSCULAR | Status: DC
Start: 2023-05-16 — End: 2023-05-17
  Administered 2023-05-16 – 2023-05-17 (×3): 4 [IU] via SUBCUTANEOUS
  Filled 2023-05-16 (×2): qty 1

## 2023-05-16 MED ORDER — INSULIN GLARGINE-YFGN 100 UNIT/ML ~~LOC~~ SOLN
35.0000 [IU] | Freq: Every day | SUBCUTANEOUS | Status: DC
  Administered 2023-05-16: 35 [IU] via SUBCUTANEOUS
  Filled 2023-05-16: qty 0.35

## 2023-05-16 MED ORDER — MAGNESIUM SULFATE 2 GM/50ML IV SOLN
2.0000 g | Freq: Once | INTRAVENOUS | Status: AC
  Administered 2023-05-16: 2 g via INTRAVENOUS
  Filled 2023-05-16: qty 50

## 2023-05-16 MED ORDER — POTASSIUM CHLORIDE CRYS ER 20 MEQ PO TBCR
40.0000 meq | EXTENDED_RELEASE_TABLET | ORAL | Status: AC
  Administered 2023-05-16 (×2): 40 meq via ORAL
  Filled 2023-05-16 (×2): qty 2

## 2023-05-16 NOTE — Progress Notes (Signed)
Progress Note   Patient: Samantha Freeman UJW:[id number] DOB: 19-Aug-1935 DOA: 05/11/2023     5 DOS: the patient was seen and examined on 05/16/2023   Brief hospital course: Rylenn Vulgamore is a 88 y.o. female with medical history significant of recent pulmonary embolism on Eliquis, atrial fibrillation on Eliquis, CHF, type 2 diabetes, hypertension, hyperlipidemia, CKD stage IIIa, depression/anxiety who presents to the ED 2/2 shortness of breathe.  Patient is diagnosed with acute on chronic diastolic congestive heart failure, was started on IV Lasix.   Principal Problem:   Acute hypoxic respiratory failure (HCC) Active Problems:   Acute heart failure with preserved ejection fraction (HFpEF) (HCC)   HTN (hypertension)   Atrial fibrillation (HCC)   Pulmonary embolism (HCC)   Type 2 diabetes mellitus (HCC)   Assessment and Plan: Acute hypoxic respiratory failure secondary to CHF with preserved EF:   Acute heart failure with preserved ejection fraction (HFpEF) (HCC): improving Has elevated BNP and pulmonary edema on CXR Echocardiogram in October 2024 demonstrated preserved LVEF at 60-65% with moderate LVH and indeterminate diastolic function.  Patient was treated with Lasix 40 mg IV daily, still has significant volume overload, increased to 40 mg twice a day.  Monitor electrolytes and renal function.  Also added Aldactone.  [name]. Patient had 4 admissions in the last 4 months with diagnosis of congestive heart failure exacerbation.  Patient admitted drinking excessive water, 32 ounces x 3 times a day in addition to soups, tea and coffee.  Advised patient to reduce water intake to less than a half a gallon a day. Patient continues to improve today, will continue higher dose of Lasix for another day.  Patient is off oxygen.   Epistaxis likely from nasal dryness: Resolved spontaneously Use Humidified oxygen Resolved.   Hypokalemia: Hypomagnesemia.  Continue replete potassium and magnesium.   Recheck levels tomorrow.   Type 2 diabetes mellitus (HCC) uncontrolled with hyperglycemia. Glucose higher, increase the dose of insulin glargine.   Pulmonary embolism (HCC) Recent admission for PE complicated by epistaxis requiring embolization. Continue home Eliquis   Chronic atrial fibrillation [facility]): Rate moderately controlled around 100, Continue home metoprolol, Eliquis   HTN (hypertension): Controlled, continue home regimen   Chronic kidney disease stage II, not stage III. Reviewed the chart, patient GFR has always been above 60.        Subjective:  Patient is doing much better today, off oxygen.  Physical Exam: Vitals:   05/16/23 0327 05/16/23 0500 05/16/23 0822 05/16/23 1245  BP: 133/65  (!) 141/76 (!) 120/90  Pulse: 88  82 99  Resp: 19  18 16   Temp: 97.8 F (36.6 C)  98.2 F (36.8 C) 98.1 F (36.7 C)  TempSrc: Oral  Oral Oral  SpO2: 91%  92% 93%  Weight:  77.8 kg    Height:       General exam: Appears calm and comfortable  Respiratory system: A few crackles, much improved. Respiratory effort normal. Cardiovascular system: Irregular. No JVD, murmurs, rubs, gallops or clicks. No pedal edema. Gastrointestinal system: Abdomen is nondistended, soft and nontender. No organomegaly or masses felt. Normal bowel sounds heard. Central nervous system: Alert and oriented. No focal neurological deficits. Extremities: Symmetric 5 x 5 power. Skin: No rashes, lesions or ulcers Psychiatry: Judgement and insight appear normal. Mood & affect appropriate.    Data Reviewed:  Lab results reviewed.  Family Communication: None  Disposition: Status is: Inpatient Remains inpatient appropriate because: Severity of disease, IV treatment.     Time spent:  35 minutes  Author: Marrion Coy, MD 05/16/2023 1:12 PM  For on call review [email or web address].

## 2023-05-16 NOTE — Progress Notes (Signed)
Heart Failure Stewardship Pharmacy Note  PCP: Marguarite Arbour, MD PCP-Cardiologist: None  HPI: Samantha Freeman is a 88 y.o. female with  recent pulmonary embolism on Eliquis with epistaxis requiring embolization, atrial fibrillation on Eliquis, CHF, type 2 diabetes, hypertension, hyperlipidemia, CKD stage IIIa, depression/anxiety who presented with fatigue and shortness of breath. BNP on admission was 530.5. HS-troponin on admission was 11.   Pertinent cardiac history: LVEF in 01/2023 was 60-65% with indeterminate diastolic dysfunction.   Pertinent Lab Values: Creatinine  Date Value Ref Range Status  02/15/2012 0.87 0.60 - 1.30 mg/dL Final   Creatinine, Ser  Date Value Ref Range Status  05/16/2023 0.80 0.44 - 1.00 mg/dL Final   BUN  Date Value Ref Range Status  05/16/2023 24 (H) 8 - 23 mg/dL Final  65/78/4696 14 7 - 18 mg/dL Final   Potassium  Date Value Ref Range Status  05/16/2023 3.4 (L) 3.5 - 5.1 mmol/L Final  02/15/2012 3.7 3.5 - 5.1 mmol/L Final   Sodium  Date Value Ref Range Status  05/16/2023 138 135 - 145 mmol/L Final  02/15/2012 140 136 - 145 mmol/L Final   B Natriuretic Peptide  Date Value Ref Range Status  05/11/2023 530.5 (H) 0.0 - 100.0 pg/mL Final    Comment:    Performed at [facility], [address] [place], [place] [address]   Magnesium  Date Value Ref Range Status  05/16/2023 1.6 (L) 1.7 - 2.4 mg/dL Final    Comment:    Performed at [facility], [address] [place], [place] [address]   Hgb A1c MFr Bld  Date Value Ref Range Status  01/31/2023 7.6 (H) 4.8 - 5.6 % Final    Comment:    (NOTE) Pre diabetes:          5.7%-6.4%  Diabetes:              >6.4%  Glycemic control for   <7.0% adults with diabetes    Digoxin Level  Date Value Ref Range Status  05/16/2023 0.5 (L) 0.8 - 2.0 ng/mL Final    Comment:    Performed at [facility], [address] [place], [place] [address]   TSH  Date Value Ref  Range Status  01/30/2023 4.844 (H) 0.350 - 4.500 uIU/mL Final    Comment:    Performed by a 3rd Generation assay with a functional sensitivity of <=0.01 uIU/mL. Performed at [facility], [address] [place], [place] [address]    LDH  Date Value Ref Range Status  12/11/2018 239 (H) 98 - 192 U/L Final    Comment:    Performed at [facility], [address] [place], [place] [address]    Vital Signs:  Temp:  [97.5 F (36.4 C)-98.2 F (36.8 C)] 97.8 F (36.6 C) (01/23 0327) Pulse Rate:  [61-88] 88 (01/23 0327) Cardiac Rhythm: Atrial fibrillation (01/22 1958) Resp:  [14-19] 19 (01/23 0327) BP: (121-145)/(65-77) 133/65 (01/23 0327) SpO2:  [90 %-94 %] 91 % (01/23 0327) Weight:  [77.4 kg (170 lb 10.2 oz)-77.8 kg (171 lb 8.3 oz)] 77.8 kg (171 lb 8.3 oz) (01/23 0500)  Intake/Output Summary (Last 24 hours) at 05/16/2023 0802 Last data filed at 05/15/2023 1415 Gross per 24 hour  Intake 360 ml  Output 400 ml  Net -40 ml    Current Heart Failure Medications:  Loop diuretic: furosemide 40 mg IV BID Beta-Blocker: metoprolol tartrate 25 mg BID ACEI/ARB/ARNI: none MRA: none SGLT2i: spironolactone 25  mg daily Other: diltiazem ER 360 mg daily, digoxin 0.125 mg daily  Prior to admission Heart Failure Medications:  Loop diuretic: furosemide 20 mg daily Beta-Blocker: metoprolol tartrate 50 mg BID ACEI/ARB/ARNI: none MRA: none SGLT2i: Farxiga 10 mg daily Other: diltiazem ER 180 mg daily, digoxin 0.125 mg daily  Assessment: 1. Acute on chronic diastolic heart failure (LVEF 60-65%)  , due to presumed NICM. NYHA class III-IV symptoms.  -Symptoms: Reports shortness of breath is much improved with IV furosemide.  -Volume: Reports good urine output on IV furosemide.Still mildly hypervolemic. Continue IV furosemide today. -Hemodynamics: BP has been stable after starting spironolactone. -SGLT2i: Not an ideal candidate given age and mobility. Can consider  outpatient based on provider-patient conversation. -MRA: Tolerating spironolactone 25 mg daily well. K and renal function stable. -ARNI: BP too soft at this time. -Digoxin level this AM of 0.5 > may require dose reduction. Suspect digoxin level is higher this check due to increases in diltiazem, which is known to increase digoxin levels.  Plan: 1) Medication changes recommended at this time: -None  2) Patient assistance: -Pending  3) Education: - Patient has been educated on current HF medications and potential additions to HF medication regimen - Patient verbalizes understanding that over the next few months, these medication doses may change and more medications may be added to optimize HF regimen - Patient has been educated on basic disease state pathophysiology and goals of therapy  Medication Assistance / Insurance Benefits Check: Does the patient have prescription insurance?    Type of insurance plan:  Does the patient qualify for medication assistance through manufacturers or grants? Pending   Outpatient Pharmacy: Prior to admission outpatient pharmacy: [facility]     Please do not hesitate to reach out with questions or concerns,  Enos Fling, PharmD, CPP, BCPS Heart Failure Pharmacist  Phone - [phone] 05/16/2023 8:02 AM

## 2023-05-17 ENCOUNTER — Other Ambulatory Visit (HOSPITAL_COMMUNITY): Payer: Self-pay

## 2023-05-17 ENCOUNTER — Telehealth (HOSPITAL_COMMUNITY): Payer: Self-pay | Admitting: Pharmacy Technician

## 2023-05-17 DIAGNOSIS — I5031 Acute diastolic (congestive) heart failure: Secondary | ICD-10-CM | POA: Diagnosis not present

## 2023-05-17 DIAGNOSIS — N1831 Chronic kidney disease, stage 3a: Secondary | ICD-10-CM | POA: Diagnosis not present

## 2023-05-17 DIAGNOSIS — J9601 Acute respiratory failure with hypoxia: Secondary | ICD-10-CM | POA: Diagnosis not present

## 2023-05-17 DIAGNOSIS — E1122 Type 2 diabetes mellitus with diabetic chronic kidney disease: Secondary | ICD-10-CM | POA: Diagnosis not present

## 2023-05-17 LAB — BASIC METABOLIC PANEL
Anion gap: 15 (ref 5–15)
BUN: 25 mg/dL — ABNORMAL HIGH (ref 8–23)
CO2: 31 mmol/L (ref 22–32)
Calcium: 9.7 mg/dL (ref 8.9–10.3)
Chloride: 93 mmol/L — ABNORMAL LOW (ref 98–111)
Creatinine, Ser: 0.92 mg/dL (ref 0.44–1.00)
GFR, Estimated: >60 mL/min (ref >60)
Glucose, Bld: 266 mg/dL — ABNORMAL HIGH (ref 70–99)
Potassium: 3.5 mmol/L (ref 3.5–5.1)
Sodium: 139 mmol/L (ref 135–145)

## 2023-05-17 LAB — GLUCOSE, CAPILLARY
Glucose-Capillary: 287 mg/dL — ABNORMAL HIGH (ref 70–99)
Glucose-Capillary: 393 mg/dL — ABNORMAL HIGH (ref 70–99)
Glucose-Capillary: 322 mg/dL — ABNORMAL HIGH (ref 70–99)
Glucose-Capillary: 222 mg/dL — ABNORMAL HIGH (ref 70–99)

## 2023-05-17 LAB — MAGNESIUM: Magnesium: 1.7 mg/dL (ref 1.7–2.4)

## 2023-05-17 MED ORDER — MAGNESIUM SULFATE 2 GM/50ML IV SOLN
2.0000 g | Freq: Once | INTRAVENOUS | Status: AC
  Administered 2023-05-17: 2 g via INTRAVENOUS
  Filled 2023-05-17: qty 50

## 2023-05-17 MED ORDER — INSULIN ASPART 100 UNIT/ML IJ SOLN
6.0000 [IU] | Freq: Three times a day (TID) | INTRAMUSCULAR | Status: DC
Start: 2023-05-17 — End: 2023-05-18
  Administered 2023-05-17 – 2023-05-18 (×3): 6 [IU] via SUBCUTANEOUS
  Filled 2023-05-17 (×2): qty 1

## 2023-05-17 MED ORDER — INSULIN GLARGINE-YFGN 100 UNIT/ML ~~LOC~~ SOLN
40.0000 [IU] | Freq: Every day | SUBCUTANEOUS | Status: DC
  Administered 2023-05-17: 40 [IU] via SUBCUTANEOUS
  Filled 2023-05-17 (×2): qty 0.4

## 2023-05-17 MED ORDER — ORAL CARE MOUTH RINSE: 15.0000 mL | OROMUCOSAL | Status: DC | PRN

## 2023-05-17 MED ORDER — POTASSIUM CHLORIDE CRYS ER 20 MEQ PO TBCR
40.0000 meq | EXTENDED_RELEASE_TABLET | Freq: Once | ORAL | Status: AC
  Administered 2023-05-17: 40 meq via ORAL
  Filled 2023-05-17: qty 2

## 2023-05-17 NOTE — Progress Notes (Signed)
Physical Therapy Treatment Patient Details Name: Samantha Freeman MRN: [id number] DOB: 07/09/1935 Today's Date: 05/17/2023   History of Present Illness Patient is an 88 year old female with recent PE and a fib on Eliquis,  who presented to ED secondary to shortness of breath. Marland Kitchen PMHx: atrial fibrillation not on AC, HFpEF, type 2 diabetes, hypertension, hyperlipidemia, CKD stage IIIa, depression/anxiety presented acute respiratory failure hypoxia, A-fib with RVR, and acute on chronic HFpEF.    PT Comments  Pt received up in chair, son at bedside. 90% at rest on RA. Discussed POC and plans to d/c home tomorrow with HHPT. Pt completed household distance gait training with single UE support ~62ft on RA. SpO2 dropped to 85% upon returning to chair with minimal SOB requiring 2 minutes rest period with cues for PLB technique to return to 90%. Pt states she has a Pulse Ox and O2 at home and is more aware of fluid restrictions this admit.    If plan is discharge home, recommend the following: A little help with bathing/dressing/bathroom;Assistance with cooking/housework;Assist for transportation   Can travel by private vehicle        Equipment Recommendations  BSC/3in1    Recommendations for Other Services       Precautions / Restrictions Precautions Precautions: Fall Restrictions Weight Bearing Restrictions Per Provider Order: No     Mobility  Bed Mobility               General bed mobility comments: NT upin in chair    Transfers Overall transfer level: Modified independent Equipment used: None                    Ambulation/Gait Ambulation/Gait assistance: Supervision Gait Distance (Feet): 55 Feet Assistive device: 1 person hand held assist Gait Pattern/deviations: Decreased stride length, Shuffle Gait velocity: decr     General Gait Details: No LOB, slow safe cadence.   Stairs             Wheelchair Mobility     Tilt Bed    Modified Rankin (Stroke  Patients Only)       Balance Overall balance assessment: Needs assistance Sitting-balance support: No upper extremity supported, Feet supported Sitting balance-Leahy Scale: Good     Standing balance support: Single extremity supported, During functional activity Standing balance-Leahy Scale: Fair                              Cognition Arousal: Alert Behavior During Therapy: WFL for tasks assessed/performed Overall Cognitive Status: Within Functional Limits for tasks assessed                                          Exercises Other Exercises Other Exercises: Patient educated on role of PT in acute care setting, PLB technique, pacing self, and following MD's recs to limit fluid intake upon returning home    General Comments General comments (skin integrity, edema, etc.): Pt on RA 90% at rest, dropping to 85% after gait training, 2 minutes to return to 90% with cues for PLB      Pertinent Vitals/Pain Pain Assessment Pain Assessment: No/denies pain    Home Living                          Prior Function  PT Goals (current goals can now be found in the care plan section) Acute Rehab PT Goals Patient Stated Goal: to return home Progress towards PT goals: Progressing toward goals    Frequency    Min 1X/week      PT Plan      Co-evaluation              AM-PAC PT "6 Clicks" Mobility   Outcome Measure  Help needed turning from your back to your side while in a flat bed without using bedrails?: A Little Help needed moving from lying on your back to sitting on the side of a flat bed without using bedrails?: A Little Help needed moving to and from a bed to a chair (including a wheelchair)?: A Little Help needed standing up from a chair using your arms (e.g., wheelchair or bedside chair)?: A Little Help needed to walk in hospital room?: A Little Help needed climbing 3-5 steps with a railing? : A Lot 6 Click  Score: 17    End of Session Equipment Utilized During Treatment: Gait belt Activity Tolerance: Patient tolerated treatment well Patient left: in chair;with call bell/phone within reach;with family/visitor present Nurse Communication: Mobility status;Other (comment) (SpO2) PT Visit Diagnosis: Unsteadiness on feet (R26.81);Muscle weakness (generalized) (M62.81);Difficulty in walking, not elsewhere classified (R26.2)     Time: 1610-9604 PT Time Calculation (min) (ACUTE ONLY): 16 min  Charges:    $Therapeutic Activity: 8-22 mins PT General Charges $$ ACUTE PT VISIT: 1 Visit                    Zadie Cleverly, PTA  Jannet Askew 05/17/2023, 3:04 PM

## 2023-05-17 NOTE — Telephone Encounter (Signed)
Patient Product/process development scientist completed.    The patient is insured through [facility]. Patient has Medicare and is not eligible for a copay card, but may be able to apply for patient assistance or Medicare RX Payment Plan (Patient Must reach out to their plan, if eligible for payment plan), if available.    Ran test claim for Farxiga 10 mg and the current 30 day co-pay is $243.42 due to a deductible.  Will be $47.00 once deductible is met.  Ran test claim for Jardiance 10 mg and the current 30 day co-pay is $243.42 due to a deductible.  Will be $47.00 once deductible is met.   This test claim was processed through [facility]- copay amounts may vary at other pharmacies due to pharmacy/plan contracts, or as the patient moves through the different stages of their insurance plan.     Roland Earl, CPHT Pharmacy Technician III Certified Patient Advocate [facility] Patient Advocate Team Direct Number: [phone]  Fax: [phone]

## 2023-05-17 NOTE — Progress Notes (Signed)
Progress Note   Patient: Samantha Freeman FTD:[id number] DOB: October 29, 1935 DOA: 05/11/2023     6 DOS: the patient was seen and examined on 05/17/2023   Brief hospital course: Charleigh Correnti is a 88 y.o. female with medical history significant of recent pulmonary embolism on Eliquis, atrial fibrillation on Eliquis, CHF, type 2 diabetes, hypertension, hyperlipidemia, CKD stage IIIa, depression/anxiety who presents to the ED 2/2 shortness of breathe.  Patient is diagnosed with acute on chronic diastolic congestive heart failure, was started on IV Lasix.   Principal Problem:   Acute hypoxic respiratory failure (HCC) Active Problems:   Acute heart failure with preserved ejection fraction (HFpEF) (HCC)   HTN (hypertension)   Atrial fibrillation (HCC)   Pulmonary embolism (HCC)   Type 2 diabetes mellitus (HCC)   Assessment and Plan:  Acute hypoxic respiratory failure secondary to CHF with preserved EF:   Acute heart failure with preserved ejection fraction (HFpEF) (HCC): improving Has elevated BNP and pulmonary edema on CXR Echocardiogram in October 2024 demonstrated preserved LVEF at 60-65% with moderate LVH and indeterminate diastolic function.  Patient was treated with Lasix 40 mg IV daily, still has significant volume overload, increased to 40 mg twice a day.  Monitor electrolytes and renal function.  Also added Aldactone.  [name]. Patient had 4 admissions in the last 4 months with diagnosis of congestive heart failure exacerbation.  Patient admitted drinking excessive water, 32 ounces x 3 times a day in addition to soups, tea and coffee.  Advised patient to reduce water intake to less than a half a gallon a day. Patient condition improving, off oxygen.  Will benefit of additional day of IV Lasix.  Continue to follow.   Epistaxis likely from nasal dryness: Resolved spontaneously Use Humidified oxygen Resolved.   Hypokalemia: Hypomagnesemia.  Continue replete potassium and magnesium.   Recheck levels tomorrow.   Type 2 diabetes mellitus (HCC) uncontrolled with hyperglycemia. Glucose still running high, insulin glargine was increased in dose, also increased dose of scheduled NovoLog.   Pulmonary embolism (HCC) Recent admission for PE complicated by epistaxis requiring embolization. Continue home Eliquis   Chronic atrial fibrillation [facility]): Rate moderately controlled around 100, Continue home metoprolol, Eliquis   HTN (hypertension): Controlled, continue home regimen   Chronic kidney disease stage II, not stage III. Reviewed the chart, patient GFR has always been above 60.     Subjective:  Patient doing better, no segment short of breath, off oxygen.  Physical Exam: Vitals:   05/16/23 2349 05/17/23 0500 05/17/23 0514 05/17/23 0918  BP: 130/83  (!) 128/99 129/85  Pulse: 64  63 100  Resp: 18  16 16   Temp: 97.7 F (36.5 C)  (!) 97.5 F (36.4 C) 98.2 F (36.8 C)  TempSrc: Oral  Oral Axillary  SpO2: 90%  90% 98%  Weight:  75 kg    Height:       General exam: Appears calm and comfortable  Respiratory system: Clear to auscultation. Respiratory effort normal. Cardiovascular system: S1 & S2 heard, RRR. No JVD, murmurs, rubs, gallops or clicks. No pedal edema. Gastrointestinal system: Abdomen is nondistended, soft and nontender. No organomegaly or masses felt. Normal bowel sounds heard. Central nervous system: Alert and oriented. No focal neurological deficits. Extremities: Symmetric 5 x 5 power. Skin: No rashes, lesions or ulcers Psychiatry: Judgement and insight appear normal. Mood & affect appropriate.    Data Reviewed:  Lab results reviewed.  Family Communication: None  Disposition: Status is: Inpatient Remains inpatient appropriate because: Severity of  disease, IV treatment.     Time spent: 35 minutes  Author: Marrion Coy, MD 05/17/2023 12:08 PM  For on call review [email or web address].

## 2023-05-17 NOTE — Plan of Care (Signed)
  Problem: Coping: Goal: Ability to adjust to condition or change in health will improve Outcome: Progressing   Problem: Fluid Volume: Goal: Ability to maintain a balanced intake and output will improve Outcome: Progressing   Problem: Nutritional: Goal: Maintenance of adequate nutrition will improve Outcome: Progressing   Problem: Tissue Perfusion: Goal: Adequacy of tissue perfusion will improve Outcome: Progressing   Problem: Clinical Measurements: Goal: Will remain free from infection Outcome: Progressing Goal: Respiratory complications will improve Outcome: Progressing   Problem: Activity: Goal: Risk for activity intolerance will decrease Outcome: Progressing   Problem: Elimination: Goal: Will not experience complications related to bowel motility Outcome: Progressing   Problem: Metabolic: Goal: Ability to maintain appropriate glucose levels will improve Outcome: Not Progressing   Problem: Clinical Measurements: Goal: Cardiovascular complication will be avoided Outcome: Not Progressing

## 2023-05-17 NOTE — Inpatient Diabetes Management (Signed)
Inpatient Diabetes Program Recommendations  AACE/ADA: New Consensus Statement on Inpatient Glycemic Control (2015)  Target Ranges:  Prepandial:   less than 140 mg/dL      Peak postprandial:   less than 180 mg/dL (1-2 hours)      Critically ill patients:  140 - 180 mg/dL    Latest Reference Range & Units 05/16/23 08:23 05/16/23 12:41 05/16/23 15:39 05/16/23 21:28  Glucose-Capillary 70 - 99 mg/dL 161 (H)  6 units Novolog  282 (H)  11 units Novolog  352 (H)  15 units Novolog @1735  309 (H)    35 units Semglee  (H): Data is abnormally high  Latest Reference Range & Units 05/17/23 08:19  Glucose-Capillary 70 - 99 mg/dL 096 (H)  12 units Novolog   (H): Data is abnormally high     Home DM Meds: Farxiga 10 mg daily Humalog 7-19 units tid with meals  Lantus 30 units at HS   Current Orders: Semglee 40 units at HS Novolog Moderate Correction Scale/ SSI (0-15 units) TID AC  Novolog 4 units TID with meals Farxiga 10 mg daily    MD- Note Semglee increased to 40 units at bedtime and Novolog meal coverage increased last PM  Please consider increasing the Novolog Meal Coverage further to 6 units TID with meals    --Will follow patient during hospitalization--  Ambrose Finland RN, MSN, CDCES Diabetes Coordinator Inpatient Glycemic Control Team Team Pager: [phone] (8a-5p)

## 2023-05-17 NOTE — Progress Notes (Signed)
Heart Failure Stewardship Pharmacy Note  PCP: Marguarite Arbour, MD PCP-Cardiologist: None  HPI: Samantha Freeman is a 88 y.o. female with  recent pulmonary embolism on Eliquis with epistaxis requiring embolization, atrial fibrillation on Eliquis, CHF, type 2 diabetes, hypertension, hyperlipidemia, CKD stage IIIa, depression/anxiety who presented with fatigue and shortness of breath. BNP on admission was 530.5. HS-troponin on admission was 11.   Pertinent cardiac history: LVEF in 01/2023 was 60-65% with indeterminate diastolic dysfunction.   Pertinent Lab Values: Creatinine  Date Value Ref Range Status  02/15/2012 0.87 0.60 - 1.30 mg/dL Final   Creatinine, Ser  Date Value Ref Range Status  05/17/2023 0.92 0.44 - 1.00 mg/dL Final   BUN  Date Value Ref Range Status  05/17/2023 25 (H) 8 - 23 mg/dL Final  16/01/9603 14 7 - 18 mg/dL Final   Potassium  Date Value Ref Range Status  05/17/2023 3.5 3.5 - 5.1 mmol/L Final  02/15/2012 3.7 3.5 - 5.1 mmol/L Final   Sodium  Date Value Ref Range Status  05/17/2023 139 135 - 145 mmol/L Final  02/15/2012 140 136 - 145 mmol/L Final   B Natriuretic Peptide  Date Value Ref Range Status  05/11/2023 530.5 (H) 0.0 - 100.0 pg/mL Final    Comment:    Performed at [facility], [address] [place], [place] [address]   Magnesium  Date Value Ref Range Status  05/17/2023 1.7 1.7 - 2.4 mg/dL Final    Comment:    Performed at [facility], [address] [place], [place] [address]   Hgb A1c MFr Bld  Date Value Ref Range Status  01/31/2023 7.6 (H) 4.8 - 5.6 % Final    Comment:    (NOTE) Pre diabetes:          5.7%-6.4%  Diabetes:              >6.4%  Glycemic control for   <7.0% adults with diabetes    Digoxin Level  Date Value Ref Range Status  05/16/2023 0.5 (L) 0.8 - 2.0 ng/mL Final    Comment:    Performed at [facility], [address] [place], [place] [address]   TSH  Date Value Ref Range  Status  01/30/2023 4.844 (H) 0.350 - 4.500 uIU/mL Final    Comment:    Performed by a 3rd Generation assay with a functional sensitivity of <=0.01 uIU/mL. Performed at [facility], [address] [place], [place] [address]    LDH  Date Value Ref Range Status  12/11/2018 239 (H) 98 - 192 U/L Final    Comment:    Performed at [facility], [address], [place], [place] [address]    Vital Signs:  Temp:  [97.5 F (36.4 C)-98.4 F (36.9 C)] 98.2 F (36.8 C) (01/24 0918) Pulse Rate:  [56-100] 100 (01/24 0918) Cardiac Rhythm: Atrial fibrillation (01/24 0913) Resp:  [16-18] 16 (01/24 0918) BP: (115-130)/(83-99) 129/85 (01/24 0918) SpO2:  [90 %-98 %] 98 % (01/24 0918) Weight:  [75 kg (165 lb 5.5 oz)] 75 kg (165 lb 5.5 oz) (01/24 0500)  Intake/Output Summary (Last 24 hours) at 05/17/2023 1228 Last data filed at 05/17/2023 1156 Gross per 24 hour  Intake 963 ml  Output 2100 ml  Net -1137 ml    Current Heart Failure Medications:  Loop diuretic: furosemide 40 mg IV BID Beta-Blocker: metoprolol tartrate 25 mg BID ACEI/ARB/ARNI: none MRA: none SGLT2i: spironolactone 25 mg daily Other: diltiazem ER 360 mg  daily, digoxin 0.125 mg daily  Prior to admission Heart Failure Medications:  Loop diuretic: furosemide 20 mg daily Beta-Blocker: metoprolol tartrate 50 mg BID ACEI/ARB/ARNI: none MRA: none SGLT2i: Farxiga 10 mg daily Other: diltiazem ER 180 mg daily, digoxin 0.125 mg daily  Assessment: 1. Acute on chronic diastolic heart failure (LVEF 60-65%)  , due to presumed NICM. NYHA class III-IV symptoms.  -Symptoms: Reports shortness of breath is much improved with IV furosemide. Feeling close to her baseline. -Volume: Reports good urine output on IV furosemide.Suspect she is near euvolemioc. Creatinine and BUN are beginning to trend up. Can consider swapping to oral furosemide 40 mg daily. -Hemodynamics: BP has been stable after starting  spironolactone. -SGLT2i: Continue Farxiga 10 mg daily. -MRA: Tolerating spironolactone 25 mg daily well. K and renal function stable. -ARNI: BP too soft at this time. -Digoxin level 1/23 AM of 0.5 > may require dose reduction. Suspect digoxin level is higher this check due to increases in diltiazem, which is known to increase digoxin levels.  Plan: 1) Medication changes recommended at this time: -Consider transition today to furosemide 40 mg PO daily.    2) Patient assistance: -Pending  3) Education: - Patient has been educated on current HF medications and potential additions to HF medication regimen - Patient verbalizes understanding that over the next few months, these medication doses may change and more medications may be added to optimize HF regimen - Patient has been educated on basic disease state pathophysiology and goals of therapy  Medication Assistance / Insurance Benefits Check: Does the patient have prescription insurance?    Type of insurance plan:  Does the patient qualify for medication assistance through manufacturers or grants? Pending   Outpatient Pharmacy: Prior to admission outpatient pharmacy: [facility]   Is the patient willing to utilize a [facility] pharmacy at discharge?: Yes  Please do not hesitate to reach out with questions or concerns,  Enos Fling, PharmD, CPP, BCPS Heart Failure Pharmacist  Phone - [phone] 05/17/2023 12:28 PM

## 2023-05-18 DIAGNOSIS — J9601 Acute respiratory failure with hypoxia: Secondary | ICD-10-CM | POA: Diagnosis not present

## 2023-05-18 DIAGNOSIS — I4821 Permanent atrial fibrillation: Secondary | ICD-10-CM | POA: Diagnosis not present

## 2023-05-18 DIAGNOSIS — I5031 Acute diastolic (congestive) heart failure: Secondary | ICD-10-CM | POA: Diagnosis not present

## 2023-05-18 LAB — GLUCOSE, CAPILLARY
Glucose-Capillary: 245 mg/dL — ABNORMAL HIGH (ref 70–99)
Glucose-Capillary: 266 mg/dL — ABNORMAL HIGH (ref 70–99)

## 2023-05-18 LAB — BASIC METABOLIC PANEL
Anion gap: 14 (ref 5–15)
BUN: 33 mg/dL — ABNORMAL HIGH (ref 8–23)
CO2: 32 mmol/L (ref 22–32)
Calcium: 9.4 mg/dL (ref 8.9–10.3)
Chloride: 92 mmol/L — ABNORMAL LOW (ref 98–111)
Creatinine, Ser: 1.1 mg/dL — ABNORMAL HIGH (ref 0.44–1.00)
GFR, Estimated: 49 mL/min — ABNORMAL LOW (ref >60)
Glucose, Bld: 226 mg/dL — ABNORMAL HIGH (ref 70–99)
Potassium: 3.7 mmol/L (ref 3.5–5.1)
Sodium: 138 mmol/L (ref 135–145)

## 2023-05-18 LAB — MAGNESIUM: Magnesium: 1.9 mg/dL (ref 1.7–2.4)

## 2023-05-18 MED ORDER — SPIRONOLACTONE 25 MG PO TABS: 25.0000 mg | ORAL_TABLET | Freq: Every day | ORAL | 0 refills | Status: AC

## 2023-05-18 NOTE — Plan of Care (Signed)
  Problem: Fluid Volume: Goal: Ability to maintain a balanced intake and output will improve Outcome: Progressing   Problem: Health Behavior/Discharge Planning: Goal: Ability to identify and utilize available resources and services will improve Outcome: Progressing Goal: Ability to manage health-related needs will improve Outcome: Progressing   Problem: Nutritional: Goal: Maintenance of adequate nutrition will improve Outcome: Progressing   Problem: Skin Integrity: Goal: Risk for impaired skin integrity will decrease Outcome: Progressing

## 2023-05-18 NOTE — TOC Transition Note (Signed)
Transition of Care [facility]) - Discharge Note   Patient Details  Name: Samantha Freeman MRN: [id number] Date of Birth: 21-Jun-1935  [facility] CM/SW Contact:  Rodney Langton, RN Phone Number: 05/18/2023, 12:18 PM   Clinical Narrative:     Patient medically ready for discharge, state her son will provide transportation home.  Agrees to restart with [facility] for home health needs (PT/OT/RN), Dawn aware of discharge today, orders and DC summary faxed.  No further TOC needs at this time.   Final next level of care: Home w Home Health Services Barriers to Discharge: Barriers Resolved   Patient Goals and CMS Choice Patient states their goals for this hospitalization and ongoing recovery are:: Home with home health   Choice offered to / list presented to : Patient      Discharge Placement                       Discharge Plan and Services Additional resources added to the After Visit Summary for                            [facility] Arranged: PT, RN, OT [facility] Agency: [facility] Date [facility] Agency Contacted: 05/18/23 Time HH Agency Contacted: 1218 Representative spoke with at [facility] Agency: Dawn  Social Drivers of Health (SDOH) Interventions SDOH Screenings   Food Insecurity: No Food Insecurity (05/13/2023)  Housing: Low Risk  (05/13/2023)  Transportation Needs: No Transportation Needs (05/13/2023)  Utilities: Not At Risk (05/13/2023)  Social Connections: Moderately Integrated (05/13/2023)  Tobacco Use: Low Risk  (05/13/2023)     Readmission Risk Interventions    05/13/2023   10:56 AM 03/28/2023    4:20 PM  Readmission Risk Prevention Plan  Transportation Screening Complete Complete  PCP or Specialist Appt within 3-5 Days Complete Complete  HRI or Home Care Consult Complete Complete  Social Work Consult for Recovery Care Planning/Counseling Complete Complete  Palliative Care Screening Not Applicable Not Applicable  Medication Review Oceanographer) Complete  Complete

## 2023-05-18 NOTE — Discharge Summary (Addendum)
Physician Discharge Summary   Patient: Samantha Freeman MRN: [id number] DOB: 10/18/1935  Admit date:     05/11/2023  Discharge date: 05/18/23  Discharge Physician: Marrion Coy   PCP: Marguarite Arbour, MD   Recommendations at discharge:   Follow-up with PCP in 1 week. Follow-up with cardiology in 2 weeks.  Discharge Diagnoses: Principal Problem:   Acute hypoxic respiratory failure (HCC) Active Problems:   Acute heart failure with preserved ejection fraction (HFpEF) (HCC)   HTN (hypertension)   Atrial fibrillation (HCC)   Pulmonary embolism (HCC)   Type 2 diabetes mellitus (HCC)  Resolved Problems:   * No resolved hospital problems. *  Hospital Course: Samantha Freeman is a 88 y.o. female with medical history significant of recent pulmonary embolism on Eliquis, atrial fibrillation on Eliquis, CHF, type 2 diabetes, hypertension, hyperlipidemia, CKD stage IIIa, depression/anxiety who presents to the ED 2/2 shortness of breathe.  Patient is diagnosed with acute on chronic diastolic congestive heart failure, was started on IV Lasix.  Assessment and Plan: Acute hypoxic respiratory failure secondary to CHF with preserved EF:   Acute heart failure with preserved ejection fraction (HFpEF) (HCC): improving Has elevated BNP and pulmonary edema on CXR Echocardiogram in October 2024 demonstrated preserved LVEF at 60-65% with moderate LVH and indeterminate diastolic function.  Patient was treated with Lasix 40 mg IV daily, still had significant volume overload, increased to 40 mg twice a day.    Also added Aldactone.  [name]. Patient had 4 admissions in the last 4 months with diagnosis of congestive heart failure exacerbation.  Patient admitted drinking excessive water, 32 ounces x 3 times a day in addition to soups, tea and coffee.  Advised patient to reduce water intake to less than a half a gallon a day. Patient condition improving, off oxygen.   Renal function starting worsening with  creatinine 1.1 today.  No need for additional diuretics.  Medically stable for discharge.   Epistaxis likely from nasal dryness: Resolved spontaneously Use Humidified oxygen Resolved.   Hypokalemia: Hypomagnesemia.  Resolved.   Type 2 diabetes mellitus (HCC) uncontrolled with hyperglycemia. Patient has been receiving higher dose of insulin in the hospital, but her glucose level at home was not described.  Patient states that this may be because of diet changes.  At this point, I will resume her home dose, follow-up with PCP as outpatient.   Pulmonary embolism (HCC), history of Recent admission for PE complicated by epistaxis requiring embolization. Continue home Eliquis   Chronic atrial fibrillation [facility]): Resume home treatment.   HTN (hypertension): Controlled, continue home regimen   Chronic kidney disease stage II, not stage III. Reviewed the chart, patient GFR has always been above 60. Slightly worsening renal function due to diuretics.           Consultants: None Procedures performed: None  Disposition: Home health Diet recommendation:  Discharge Diet Orders (From admission, onward)     Start     Ordered   05/18/23 0000  Diet - low sodium heart healthy       Comments: Fluid restriction: less than half gallon/day.   05/18/23 1000           Cardiac diet DISCHARGE MEDICATION: Allergies as of 05/18/2023       Reactions   Buspirone    Other Reaction(s): Dizziness   Propofol Anaphylaxis   Zolpidem Other (See Comments)   Hallucinations   Cholestyramine Itching, Rash   Codeine Nausea Only   Hydrochlorothiazide Other (See Comments)  PATIENT DOES NOT REMEMBER   Loratadine Other (See Comments)   PATIENT DOES NOT REMEMBER   Niacin Rash        Medication List     TAKE these medications    ALPRAZolam 0.5 MG tablet Commonly known as: XANAX Take 0.5 mg by mouth at bedtime as needed for anxiety or sleep.   apixaban 5 MG Tabs tablet Commonly known as:  ELIQUIS Take 2 tablets (10 mg total) by mouth 2 (two) times daily for 3 days, THEN 1 tablet (5 mg total) 2 (two) times daily for 27 days. Start taking on: April 08, 2023   benzonatate 200 MG capsule Commonly known as: TESSALON Take by mouth.   blood glucose meter kit and supplies Kit Dispense based on patient and insurance preference. Use up to four times daily as directed. (FOR ICD-9 250.00, 250.01). For QAC - HS accuchecks.   CALCIUM 600/VITAMIN D3 PO Take 1 tablet by mouth daily.   Coenzyme Q10 10 MG capsule Take 10 mg by mouth every morning.   cyanocobalamin 1000 MCG tablet Commonly known as: VITAMIN B12 Take 1,000 mcg by mouth daily.   dapagliflozin propanediol 10 MG Tabs tablet Commonly known as: FARXIGA Take 10 mg by mouth daily.   digoxin 0.125 MG tablet Commonly known as: LANOXIN Take 1 tablet (0.125 mg total) by mouth every other day.   diltiazem 180 MG 24 hr capsule Commonly known as: CARDIZEM CD Take 180 mg by mouth daily.   feeding supplement (GLUCERNA SHAKE) Liqd Take 237 mLs by mouth 3 (three) times daily between meals.   FREESTYLE LITE test strip Generic drug: glucose blood For glucose testing every before meals at bedtime. Diagnosis E 11.65  Can substitute to any accepted brand   furosemide 20 MG tablet Commonly known as: LASIX Take 1 tablet (20 mg total) by mouth daily.   gabapentin 100 MG capsule Commonly known as: NEURONTIN Take 200 mg by mouth 3 (three) times daily.   insulin lispro 100 UNIT/ML injection Commonly known as: HUMALOG Inject 7-19 Units into the skin 3 (three) times daily before meals. Blood Glucose level: 140-199 - 7 units, 200-250 - 9 units, 251-299 - 13 units,  300-349 - 17 units,  350 or above 19 units.   Insulin Syringe-Needle U-100 25G X 1" 1 ML Misc For 4 times a day insulin SQ, 1 month supply. Diagnosis E11.65   Lantus SoloStar 100 UNIT/ML Solostar Pen Generic drug: insulin glargine Inject 30 Units into the skin at  bedtime.   magnesium oxide 400 (240 Mg) MG tablet Commonly known as: MAG-OX Take 2 tablets by mouth 3 (three) times daily.   metoprolol tartrate 50 MG tablet Commonly known as: LOPRESSOR Take 1 tablet (50 mg total) by mouth 2 (two) times daily.   multivitamin tablet Take 1 tablet by mouth daily. Women's Daily Multivitamin   pantoprazole 40 MG tablet Commonly known as: PROTONIX Take 1 tablet (40 mg total) by mouth daily.   PARoxetine 10 MG tablet Commonly known as: PAXIL Take 10 mg by mouth daily.   prednisoLONE acetate 1 % ophthalmic suspension Commonly known as: PRED FORTE Place 1 drop into both eyes at bedtime.   rOPINIRole 1 MG tablet Commonly known as: REQUIP Take 1 mg by mouth at bedtime.   rosuvastatin 10 MG tablet Commonly known as: CRESTOR Take 10 mg by mouth daily.   sodium chloride 0.65 % Soln nasal spray Commonly known as: OCEAN Place 2 sprays into both nostrils 4 (four) times daily.  spironolactone 25 MG tablet Commonly known as: ALDACTONE Take 1 tablet (25 mg total) by mouth daily. Start taking on: May 19, 2023        Follow-up Information     Sparks, Duane Lope, MD Follow up in 1 week(s).   Specialty: Internal Medicine Contact information: [address] [facility] [place] [address] [phone]         Marcina Millard, MD Follow up in 2 week(s).   Specialty: Cardiology Contact information: [address] [facility] [place] [address] [phone]                Discharge Exam: Ceasar Mons Weights   05/16/23 0500 05/17/23 0500 05/18/23 0500  Weight: 77.8 kg 75 kg 74.8 kg   General exam: Appears calm and comfortable  Respiratory system: Clear to auscultation. Respiratory effort normal. Cardiovascular system: Irregular. No JVD, murmurs, rubs, gallops or clicks. No pedal edema. Gastrointestinal system: Abdomen is nondistended, soft and nontender. No organomegaly or masses  felt. Normal bowel sounds heard. Central nervous system: Alert and oriented. No focal neurological deficits. Extremities: Symmetric 5 x 5 power. Skin: No rashes, lesions or ulcers Psychiatry: Judgement and insight appear normal. Mood & affect appropriate.    Condition at discharge: good  The results of significant diagnostics from this hospitalization (including imaging, microbiology, ancillary and laboratory) are listed below for reference.   Imaging Studies: CT CHEST WO CONTRAST Result Date: 05/16/2023 CLINICAL DATA:  Persistent cough. EXAM: CT CHEST WITHOUT CONTRAST TECHNIQUE: Multidetector CT imaging of the chest was performed following the standard protocol without IV contrast. RADIATION DOSE REDUCTION: This exam was performed according to the departmental dose-optimization program which includes automated exposure control, adjustment of the mA and/or kV according to patient size and/or use of iterative reconstruction technique. COMPARISON:  Chest CT 03/28/2023 FINDINGS: Cardiovascular: Chronic cardiomegaly. No pericardial effusion. Coronary artery and aortic valvular calcifications. Aortic atherosclerosis without aneurysm. Dilated central pulmonary arteries with 3.5 cm. Mediastinum/Nodes: Shotty mediastinal lymph nodes are similar to prior exam. For example 12 mm anterior paratracheal node series 2, image 54. No progressive adenopathy. No visible thyroid nodule. Decompressed esophagus. Lungs/Pleura: Resolved left pleural effusion from prior. Trace right pleural effusion has diminished. Improved bibasilar opacities. Residual bandlike opacities in the dependent lower lobes, greater on the right. Improvement in bronchial thickening and ground-glass opacities. Mild smooth septal thickening persists. The previous 10 mm left upper lobe nodule has resolved. No new or enlarging pulmonary nodules. No new airspace disease. Upper Abdomen: No acute findings. Musculoskeletal: Mild thoracic spondylosis with  anterior spurring. There are no acute or suspicious osseous abnormalities. Left shoulder arthroplasty. IMPRESSION: 1. Resolved left pleural effusion diminished right pleural effusion from last month, minimal right pleural effusion persists. 2. Improvement in bibasilar opacities from prior. Residual bandlike opacities in the dependent lower lobes, right greater than left, favors atelectasis. 3. Improvement in bronchial thickening and ground-glass opacities. Mild residual septal thickening persists. Fever improvement in pulmonary edema with mild residual. 4. Resolved left upper lobe pulmonary nodule from prior exam. 5. Similar shotty mediastinal lymph nodes without progression, favored to be reactive. 6. Chronic cardiomegaly.  Coronary artery calcifications. Aortic Atherosclerosis (ICD10-I70.0). Electronically Signed   By: Narda Rutherford M.D.   On: 05/16/2023 11:46   DG Chest 2 View Result Date: 05/11/2023 CLINICAL DATA:  Short of breath. Diagnosed with atrial fibrillation 3 weeks ago. Nonproductive cough. EXAM: CHEST - 2 VIEW COMPARISON:  Chest CT, 05/08/2023.  Chest radiographs, 03/28/2023. FINDINGS: Mild enlargement the  cardiac silhouette. No mediastinal or hilar masses. No evidence of adenopathy. Small bilateral pleural effusions. Bilateral interstitial thickening most evident in the lower lungs. Additional posterior lung base opacities are noted that are suspected to be atelectasis. No pneumothorax. Left reverse shoulder prosthesis appears well positioned and aligned and unchanged IMPRESSION: Findings consistent with congestive heart failure with interstitial pulmonary edema and small bilateral pleural effusions. Electronically Signed   By: Amie Portland M.D.   On: 05/11/2023 13:13    Microbiology: Results for orders placed or performed during the hospital encounter of 05/11/23  Resp panel by RT-PCR (RSV, Flu A&B, Covid) Anterior Nasal Swab     Status: None   Collection Time: 05/11/23  1:50 PM    Specimen: Anterior Nasal Swab  Result Value Ref Range Status   SARS Coronavirus 2 by RT PCR NEGATIVE NEGATIVE Final    Comment: (NOTE) SARS-CoV-2 target nucleic acids are NOT DETECTED.  The SARS-CoV-2 RNA is generally detectable in upper respiratory specimens during the acute phase of infection. The lowest concentration of SARS-CoV-2 viral copies this assay can detect is 138 copies/mL. A negative result does not preclude SARS-Cov-2 infection and should not be used as the sole basis for treatment or other patient management decisions. A negative result may occur with  improper specimen collection/handling, submission of specimen other than nasopharyngeal swab, presence of viral mutation(s) within the areas targeted by this assay, and inadequate number of viral copies(<138 copies/mL). A negative result must be combined with clinical observations, patient history, and epidemiological information. The expected result is Negative.  Fact Sheet for Patients:  BloggerCourse.com  Fact Sheet for Healthcare Providers:  SeriousBroker.it  This test is no t yet approved or cleared by the [place] FDA and  has been authorized for detection and/or diagnosis of SARS-CoV-2 by FDA under an Emergency Use Authorization (EUA). This EUA will remain  in effect (meaning this test can be used) for the duration of the COVID-19 declaration under Section 564(b)(1) of the Act, 21 U.S.C.section 360bbb-3(b)(1), unless the authorization is terminated  or revoked sooner.       Influenza A by PCR NEGATIVE NEGATIVE Final   Influenza B by PCR NEGATIVE NEGATIVE Final    Comment: (NOTE) The Xpert Xpress SARS-CoV-2/FLU/RSV plus assay is intended as an aid in the diagnosis of influenza from Nasopharyngeal swab specimens and should not be used as a sole basis for treatment. Nasal washings and aspirates are unacceptable for Xpert Xpress  SARS-CoV-2/FLU/RSV testing.  Fact Sheet for Patients: BloggerCourse.com  Fact Sheet for Healthcare Providers: SeriousBroker.it  This test is not yet approved or cleared by the [place] FDA and has been authorized for detection and/or diagnosis of SARS-CoV-2 by FDA under an Emergency Use Authorization (EUA). This EUA will remain in effect (meaning this test can be used) for the duration of the COVID-19 declaration under Section 564(b)(1) of the Act, 21 U.S.C. section 360bbb-3(b)(1), unless the authorization is terminated or revoked.     Resp Syncytial Virus by PCR NEGATIVE NEGATIVE Final    Comment: (NOTE) Fact Sheet for Patients: BloggerCourse.com  Fact Sheet for Healthcare Providers: SeriousBroker.it  This test is not yet approved or cleared by the [place] FDA and has been authorized for detection and/or diagnosis of SARS-CoV-2 by FDA under an Emergency Use Authorization (EUA). This EUA will remain in effect (meaning this test can be used) for the duration of the COVID-19 declaration under Section 564(b)(1) of the Act, 21 U.S.C. section 360bbb-3(b)(1), unless the authorization is terminated  or revoked.  Performed at [facility], [address] [place], [place] [address]   Respiratory (~20 pathogens) panel by PCR     Status: None   Collection Time: 05/11/23  3:53 PM   Specimen: Nasopharyngeal Swab; Respiratory  Result Value Ref Range Status   Adenovirus NOT DETECTED NOT DETECTED Final   Coronavirus 229E NOT DETECTED NOT DETECTED Final    Comment: (NOTE) The Coronavirus on the Respiratory Panel, DOES NOT test for the novel  Coronavirus (2019 nCoV)    Coronavirus HKU1 NOT DETECTED NOT DETECTED Final   Coronavirus NL63 NOT DETECTED NOT DETECTED Final   Coronavirus OC43 NOT DETECTED NOT DETECTED Final   Metapneumovirus NOT DETECTED NOT DETECTED Final    Rhinovirus / Enterovirus NOT DETECTED NOT DETECTED Final   Influenza A NOT DETECTED NOT DETECTED Final   Influenza B NOT DETECTED NOT DETECTED Final   Parainfluenza Virus 1 NOT DETECTED NOT DETECTED Final   Parainfluenza Virus 2 NOT DETECTED NOT DETECTED Final   Parainfluenza Virus 3 NOT DETECTED NOT DETECTED Final   Parainfluenza Virus 4 NOT DETECTED NOT DETECTED Final   Respiratory Syncytial Virus NOT DETECTED NOT DETECTED Final   Bordetella pertussis NOT DETECTED NOT DETECTED Final   Bordetella Parapertussis NOT DETECTED NOT DETECTED Final   Chlamydophila pneumoniae NOT DETECTED NOT DETECTED Final   Mycoplasma pneumoniae NOT DETECTED NOT DETECTED Final    Comment: Performed at [facility], [address], [place], [place] [address]    Labs: CBC: Recent Labs  Lab 05/11/23 1234 05/12/23 0616  WBC 8.7 7.7  NEUTROABS  --  4.0  HGB 14.3 13.5  HCT 45.6 43.1  MCV 93.4 94.3  PLT 277 247   Basic Metabolic Panel: Recent Labs  Lab 05/13/23 0816 05/15/23 0524 05/16/23 0519 05/17/23 0443 05/18/23 0521  NA 136 135 138 139 138  K 3.9 3.7 3.4* 3.5 3.7  CL 95* 93* 93* 93* 92*  CO2 31 29 32 31 32  GLUCOSE 187* 307* 208* 266* 226*  BUN 19 20 24* 25* 33*  CREATININE 0.87 0.86 0.80 0.92 1.10*  CALCIUM 8.7* 9.0 9.2 9.7 9.4  MG 1.9 1.8 1.6* 1.7 1.9   Liver Function Tests: No results for input(s): "AST", "ALT", "ALKPHOS", "BILITOT", "PROT", "ALBUMIN" in the last 168 hours. CBG: Recent Labs  Lab 05/17/23 0819 05/17/23 1133 05/17/23 1639 05/17/23 2145 05/18/23 0753  GLUCAP 287* 393* 322* 222* 245*    Discharge time spent: greater than 30 minutes.  Signed: Marrion Coy, MD [facility] 05/18/2023

## 2023-06-05 ENCOUNTER — Emergency Department: Payer: Medicare Other

## 2023-06-05 ENCOUNTER — Other Ambulatory Visit: Payer: Self-pay

## 2023-06-05 ENCOUNTER — Inpatient Hospital Stay
Admission: EM | Admit: 2023-06-05 | Discharge: 2023-06-12 | DRG: 291 | Disposition: A | Discharge status: Home Health | Payer: Medicare Other | Attending: Internal Medicine | Admitting: Internal Medicine

## 2023-06-05 DIAGNOSIS — J9621 Acute and chronic respiratory failure with hypoxia: Secondary | ICD-10-CM | POA: Diagnosis present

## 2023-06-05 DIAGNOSIS — N1831 Chronic kidney disease, stage 3a: Secondary | ICD-10-CM | POA: Diagnosis present

## 2023-06-05 DIAGNOSIS — Z947 Corneal transplant status: Secondary | ICD-10-CM

## 2023-06-05 DIAGNOSIS — E1122 Type 2 diabetes mellitus with diabetic chronic kidney disease: Secondary | ICD-10-CM | POA: Diagnosis present

## 2023-06-05 DIAGNOSIS — E1165 Type 2 diabetes mellitus with hyperglycemia: Secondary | ICD-10-CM | POA: Diagnosis present

## 2023-06-05 DIAGNOSIS — I083 Combined rheumatic disorders of mitral, aortic and tricuspid valves: Secondary | ICD-10-CM | POA: Diagnosis present

## 2023-06-05 DIAGNOSIS — E785 Hyperlipidemia, unspecified: Secondary | ICD-10-CM | POA: Diagnosis present

## 2023-06-05 DIAGNOSIS — K580 Irritable bowel syndrome with diarrhea: Secondary | ICD-10-CM | POA: Diagnosis present

## 2023-06-05 DIAGNOSIS — Z9981 Dependence on supplemental oxygen: Secondary | ICD-10-CM

## 2023-06-05 DIAGNOSIS — Z884 Allergy status to anesthetic agent status: Secondary | ICD-10-CM

## 2023-06-05 DIAGNOSIS — Z7901 Long term (current) use of anticoagulants: Secondary | ICD-10-CM

## 2023-06-05 DIAGNOSIS — Z96612 Presence of left artificial shoulder joint: Secondary | ICD-10-CM | POA: Diagnosis present

## 2023-06-05 DIAGNOSIS — E663 Overweight: Secondary | ICD-10-CM | POA: Diagnosis present

## 2023-06-05 DIAGNOSIS — Z66 Do not resuscitate: Secondary | ICD-10-CM | POA: Diagnosis present

## 2023-06-05 DIAGNOSIS — Z885 Allergy status to narcotic agent status: Secondary | ICD-10-CM

## 2023-06-05 DIAGNOSIS — I5033 Acute on chronic diastolic (congestive) heart failure: Secondary | ICD-10-CM | POA: Diagnosis present

## 2023-06-05 DIAGNOSIS — R197 Diarrhea, unspecified: Secondary | ICD-10-CM | POA: Diagnosis not present

## 2023-06-05 DIAGNOSIS — I130 Hypertensive heart and chronic kidney disease with heart failure and stage 1 through stage 4 chronic kidney disease, or unspecified chronic kidney disease: Principal | ICD-10-CM | POA: Diagnosis present

## 2023-06-05 DIAGNOSIS — Z794 Long term (current) use of insulin: Secondary | ICD-10-CM | POA: Diagnosis not present

## 2023-06-05 DIAGNOSIS — Z1152 Encounter for screening for COVID-19: Secondary | ICD-10-CM

## 2023-06-05 DIAGNOSIS — Z6829 Body mass index (BMI) 29.0-29.9, adult: Secondary | ICD-10-CM | POA: Diagnosis not present

## 2023-06-05 DIAGNOSIS — Z86711 Personal history of pulmonary embolism: Secondary | ICD-10-CM | POA: Diagnosis present

## 2023-06-05 DIAGNOSIS — I482 Chronic atrial fibrillation, unspecified: Secondary | ICD-10-CM | POA: Diagnosis present

## 2023-06-05 DIAGNOSIS — K219 Gastro-esophageal reflux disease without esophagitis: Secondary | ICD-10-CM | POA: Diagnosis present

## 2023-06-05 DIAGNOSIS — E1129 Type 2 diabetes mellitus with other diabetic kidney complication: Secondary | ICD-10-CM | POA: Diagnosis present

## 2023-06-05 DIAGNOSIS — Z79899 Other long term (current) drug therapy: Secondary | ICD-10-CM

## 2023-06-05 DIAGNOSIS — F419 Anxiety disorder, unspecified: Secondary | ICD-10-CM | POA: Diagnosis present

## 2023-06-05 DIAGNOSIS — Z8616 Personal history of COVID-19: Secondary | ICD-10-CM | POA: Diagnosis not present

## 2023-06-05 DIAGNOSIS — F32A Depression, unspecified: Secondary | ICD-10-CM | POA: Diagnosis present

## 2023-06-05 DIAGNOSIS — Z888 Allergy status to other drugs, medicaments and biological substances status: Secondary | ICD-10-CM

## 2023-06-05 DIAGNOSIS — Z803 Family history of malignant neoplasm of breast: Secondary | ICD-10-CM

## 2023-06-05 DIAGNOSIS — I10 Essential (primary) hypertension: Secondary | ICD-10-CM | POA: Diagnosis present

## 2023-06-05 DIAGNOSIS — Z90710 Acquired absence of both cervix and uterus: Secondary | ICD-10-CM

## 2023-06-05 DIAGNOSIS — Z96651 Presence of right artificial knee joint: Secondary | ICD-10-CM | POA: Diagnosis present

## 2023-06-05 LAB — CBC WITH DIFFERENTIAL/PLATELET
Abs Immature Granulocytes: 0.04 10*3/uL (ref 0.00–0.07)
Basophils Absolute: 0.1 10*3/uL (ref 0.0–0.1)
Basophils Relative: 1 %
Eosinophils Absolute: 0.4 10*3/uL (ref 0.0–0.5)
Eosinophils Relative: 4 %
HCT: 44.3 % (ref 36.0–46.0)
Hemoglobin: 13.9 g/dL (ref 12.0–15.0)
Immature Granulocytes: 0 %
Lymphocytes Relative: 29 %
Lymphs Abs: 2.9 10*3/uL (ref 0.7–4.0)
MCH: 28.3 pg (ref 26.0–34.0)
MCHC: 31.4 g/dL (ref 30.0–36.0)
MCV: 90 fL (ref 80.0–100.0)
Monocytes Absolute: 0.9 10*3/uL (ref 0.1–1.0)
Monocytes Relative: 9 %
Neutro Abs: 5.6 10*3/uL (ref 1.7–7.7)
Neutrophils Relative %: 57 %
Platelets: 291 10*3/uL (ref 150–400)
RBC: 4.92 MIL/uL (ref 3.87–5.11)
RDW: 14.1 % (ref 11.5–15.5)
WBC: 9.8 10*3/uL (ref 4.0–10.5)
nRBC: 0 % (ref 0.0–0.2)

## 2023-06-05 LAB — COMPREHENSIVE METABOLIC PANEL
ALT: 14 U/L (ref 0–44)
AST: 19 U/L (ref 15–41)
Albumin: 3.6 g/dL (ref 3.5–5.0)
Alkaline Phosphatase: 61 U/L (ref 38–126)
Anion gap: 13 (ref 5–15)
BUN: 17 mg/dL (ref 8–23)
CO2: 27 mmol/L (ref 22–32)
Calcium: 9.1 mg/dL (ref 8.9–10.3)
Chloride: 98 mmol/L (ref 98–111)
Creatinine, Ser: 0.96 mg/dL (ref 0.44–1.00)
GFR, Estimated: 58 mL/min — ABNORMAL LOW (ref >60)
Glucose, Bld: 123 mg/dL — ABNORMAL HIGH (ref 70–99)
Potassium: 4 mmol/L (ref 3.5–5.1)
Sodium: 138 mmol/L (ref 135–145)
Total Bilirubin: 1.1 mg/dL (ref 0.0–1.2)
Total Protein: 7.1 g/dL (ref 6.5–8.1)

## 2023-06-05 LAB — URINALYSIS, ROUTINE W REFLEX MICROSCOPIC
Bacteria, UA: NONE SEEN
Bilirubin Urine: NEGATIVE
Glucose, UA: >=500 mg/dL — AB
Ketones, ur: NEGATIVE mg/dL
Leukocytes,Ua: NEGATIVE
Nitrite: NEGATIVE
Protein, ur: 30 mg/dL — AB
Specific Gravity, Urine: 1.008 (ref 1.005–1.030)
Squamous Epithelial / HPF: 0 /[HPF] (ref 0–5)
pH: 6 (ref 5.0–8.0)

## 2023-06-05 LAB — CBG MONITORING, ED: Glucose-Capillary: 164 mg/dL — ABNORMAL HIGH (ref 70–99)

## 2023-06-05 LAB — RESP PANEL BY RT-PCR (RSV, FLU A&B, COVID)  RVPGX2
Influenza A by PCR: NEGATIVE
Influenza B by PCR: NEGATIVE
Resp Syncytial Virus by PCR: NEGATIVE
SARS Coronavirus 2 by RT PCR: NEGATIVE

## 2023-06-05 LAB — BRAIN NATRIURETIC PEPTIDE: B Natriuretic Peptide: 593.4 pg/mL — ABNORMAL HIGH (ref 0.0–100.0)

## 2023-06-05 LAB — LACTIC ACID, PLASMA
Lactic Acid, Venous: 2 mmol/L (ref 0.5–1.9)
Lactic Acid, Venous: 1.1 mmol/L (ref 0.5–1.9)

## 2023-06-05 LAB — TROPONIN I (HIGH SENSITIVITY)
Troponin I (High Sensitivity): 10 ng/L (ref <18)
Troponin I (High Sensitivity): 9 ng/L (ref <18)

## 2023-06-05 LAB — LIPASE, BLOOD: Lipase: 26 U/L (ref 11–51)

## 2023-06-05 MED ORDER — INSULIN GLARGINE-YFGN 100 UNIT/ML ~~LOC~~ SOLN
20.0000 [IU] | Freq: Every day | SUBCUTANEOUS | Status: DC
  Administered 2023-06-06 (×2): 20 [IU] via SUBCUTANEOUS
  Filled 2023-06-05 (×3): qty 0.2

## 2023-06-05 MED ORDER — APIXABAN 5 MG PO TABS
5.0000 mg | ORAL_TABLET | Freq: Two times a day (BID) | ORAL | Status: DC
  Administered 2023-06-06 – 2023-06-12 (×13): 5 mg via ORAL
  Filled 2023-06-05 (×14): qty 1

## 2023-06-05 MED ORDER — VITAMIN B-12 1000 MCG PO TABS
1000.0000 ug | ORAL_TABLET | Freq: Every day | ORAL | Status: DC
  Administered 2023-06-06 – 2023-06-12 (×7): 1000 ug via ORAL
  Filled 2023-06-05 (×2): qty 1
  Filled 2023-06-05 (×2): qty 2
  Filled 2023-06-05 (×3): qty 1

## 2023-06-05 MED ORDER — FUROSEMIDE 10 MG/ML IJ SOLN
40.0000 mg | Freq: Once | INTRAMUSCULAR | Status: AC
  Administered 2023-06-05: 40 mg via INTRAVENOUS
  Filled 2023-06-05: qty 4

## 2023-06-05 MED ORDER — ADULT MULTIVITAMIN W/MINERALS CH
1.0000 | ORAL_TABLET | Freq: Every day | ORAL | Status: DC
  Administered 2023-06-06 – 2023-06-12 (×7): 1 via ORAL
  Filled 2023-06-05 (×7): qty 1

## 2023-06-05 MED ORDER — INSULIN ASPART 100 UNIT/ML IJ SOLN
0.0000 [IU] | Freq: Every day | INTRAMUSCULAR | Status: DC
Start: 2023-06-05 — End: 2023-06-07
  Administered 2023-06-06: 3 [IU] via SUBCUTANEOUS

## 2023-06-05 MED ORDER — DM-GUAIFENESIN ER 30-600 MG PO TB12
1.0000 | ORAL_TABLET | Freq: Two times a day (BID) | ORAL | Status: DC | PRN
  Filled 2023-06-05: qty 1

## 2023-06-05 MED ORDER — FUROSEMIDE 10 MG/ML IJ SOLN
40.0000 mg | Freq: Two times a day (BID) | INTRAMUSCULAR | Status: DC
  Administered 2023-06-06 – 2023-06-07 (×3): 40 mg via INTRAVENOUS
  Filled 2023-06-05 (×3): qty 4

## 2023-06-05 MED ORDER — LOPERAMIDE HCL 2 MG PO CAPS: 2.0000 mg | ORAL_CAPSULE | Freq: Two times a day (BID) | ORAL | Status: DC | PRN

## 2023-06-05 MED ORDER — INSULIN ASPART 100 UNIT/ML IJ SOLN
0.0000 [IU] | Freq: Three times a day (TID) | INTRAMUSCULAR | Status: DC
  Administered 2023-06-06: 5 [IU] via SUBCUTANEOUS
  Administered 2023-06-06: 7 [IU] via SUBCUTANEOUS
  Administered 2023-06-06: 2 [IU] via SUBCUTANEOUS
  Administered 2023-06-07: 5 [IU] via SUBCUTANEOUS
  Administered 2023-06-07: 2 [IU] via SUBCUTANEOUS
  Administered 2023-06-07: 3 [IU] via SUBCUTANEOUS
  Filled 2023-06-05 (×6): qty 1

## 2023-06-05 MED ORDER — PAROXETINE HCL 10 MG PO TABS
10.0000 mg | ORAL_TABLET | Freq: Every day | ORAL | Status: DC
  Administered 2023-06-06 – 2023-06-11 (×6): 10 mg via ORAL
  Filled 2023-06-05 (×8): qty 1

## 2023-06-05 MED ORDER — ROPINIROLE HCL 1 MG PO TABS
1.0000 mg | ORAL_TABLET | Freq: Every day | ORAL | Status: DC
  Filled 2023-06-05: qty 1

## 2023-06-05 MED ORDER — GABAPENTIN 100 MG PO CAPS
200.0000 mg | ORAL_CAPSULE | Freq: Three times a day (TID) | ORAL | Status: DC
  Administered 2023-06-06 – 2023-06-12 (×20): 200 mg via ORAL
  Filled 2023-06-05 (×20): qty 2

## 2023-06-05 MED ORDER — ONDANSETRON HCL 4 MG/2ML IJ SOLN: 4.0000 mg | Freq: Three times a day (TID) | INTRAMUSCULAR | Status: DC | PRN

## 2023-06-05 MED ORDER — FENOFIBRATE 54 MG PO TABS
54.0000 mg | ORAL_TABLET | Freq: Every day | ORAL | Status: DC
  Administered 2023-06-06 – 2023-06-12 (×7): 54 mg via ORAL
  Filled 2023-06-05 (×8): qty 1

## 2023-06-05 MED ORDER — DIGOXIN 125 MCG PO TABS
0.1250 mg | ORAL_TABLET | Freq: Every day | ORAL | Status: DC
  Administered 2023-06-06 – 2023-06-12 (×7): 0.125 mg via ORAL
  Filled 2023-06-05 (×7): qty 1

## 2023-06-05 MED ORDER — SPIRONOLACTONE 25 MG PO TABS
25.0000 mg | ORAL_TABLET | Freq: Every day | ORAL | Status: DC
  Administered 2023-06-06 – 2023-06-12 (×7): 25 mg via ORAL
  Filled 2023-06-05 (×7): qty 1

## 2023-06-05 MED ORDER — ALPRAZOLAM 0.5 MG PO TABS
0.5000 mg | ORAL_TABLET | Freq: Every evening | ORAL | Status: DC | PRN
  Administered 2023-06-07 – 2023-06-11 (×4): 0.5 mg via ORAL
  Filled 2023-06-05 (×6): qty 1

## 2023-06-05 MED ORDER — OYSTER SHELL CALCIUM/D3 500-5 MG-MCG PO TABS
1.0000 | ORAL_TABLET | Freq: Every day | ORAL | Status: DC
  Administered 2023-06-06 – 2023-06-12 (×7): 1 via ORAL
  Filled 2023-06-05 (×7): qty 1

## 2023-06-05 MED ORDER — ALBUTEROL SULFATE (2.5 MG/3ML) 0.083% IN NEBU: 2.5000 mg | INHALATION_SOLUTION | RESPIRATORY_TRACT | Status: DC | PRN

## 2023-06-05 MED ORDER — METOPROLOL TARTRATE 50 MG PO TABS
50.0000 mg | ORAL_TABLET | Freq: Two times a day (BID) | ORAL | Status: DC
  Administered 2023-06-06 – 2023-06-12 (×14): 50 mg via ORAL
  Filled 2023-06-05: qty 1
  Filled 2023-06-05: qty 2
  Filled 2023-06-05 (×4): qty 1
  Filled 2023-06-05: qty 2
  Filled 2023-06-05: qty 1
  Filled 2023-06-05 (×2): qty 2
  Filled 2023-06-05 (×4): qty 1

## 2023-06-05 MED ORDER — DILTIAZEM HCL ER COATED BEADS 180 MG PO CP24
180.0000 mg | ORAL_CAPSULE | Freq: Every day | ORAL | Status: DC
  Administered 2023-06-06 – 2023-06-12 (×7): 180 mg via ORAL
  Filled 2023-06-05 (×7): qty 1

## 2023-06-05 MED ORDER — HYDRALAZINE HCL 20 MG/ML IJ SOLN: 5.0000 mg | INTRAMUSCULAR | Status: DC | PRN

## 2023-06-05 MED ORDER — PREDNISOLONE ACETATE 1 % OP SUSP
1.0000 [drp] | Freq: Every day | OPHTHALMIC | Status: DC
  Administered 2023-06-06 – 2023-06-11 (×5): 1 [drp] via OPHTHALMIC
  Filled 2023-06-05: qty 5
  Filled 2023-06-05: qty 1
  Filled 2023-06-05: qty 5
  Filled 2023-06-05 (×2): qty 1

## 2023-06-05 MED ORDER — ACETAMINOPHEN 325 MG PO TABS
650.0000 mg | ORAL_TABLET | Freq: Four times a day (QID) | ORAL | Status: DC | PRN
  Administered 2023-06-06: 650 mg via ORAL
  Filled 2023-06-05 (×2): qty 2

## 2023-06-05 MED ORDER — COENZYME Q10 10 MG PO CAPS: 10.0000 mg | ORAL_CAPSULE | Freq: Every morning | ORAL | Status: DC

## 2023-06-05 NOTE — ED Provider Notes (Signed)
[facility] Provider Note    Event Date/Time   First MD Initiated Contact with Patient 06/05/23 1507     (approximate)   History   Shortness of Breath and Diarrhea   HPI  Samantha Freeman is a 88 y.o. female with a history of PE on Eliquis, atrial fibrillation, CHF, type 2 diabetes, hypertension, hyperlipidemia, CKD, depression, and anxiety who presents with multiple episodes of diarrhea since 3 AM, not associated with abdominal pain or vomiting.  However, the patient and started to feel short of breath and quite weak and tired.  She denies any fever or chills.  She has a nonproductive cough.  She has no chest pain.  She states the diarrhea feels similar to prior episodes of IBS.  I reviewed the past medical records.  The patient was admitted last month with acute hypoxic respiratory failure due to CHF exacerbation.   Physical Exam   Triage Vital Signs: ED Triage Vitals  Encounter Vitals Group     BP 06/05/23 1451 135/72     Systolic BP Percentile --      Diastolic BP Percentile --      Pulse Rate 06/05/23 1451 (!) 59     Resp 06/05/23 1451 17     Temp 06/05/23 1451 (!) 97.3 F (36.3 C)     Temp Source 06/05/23 1451 Oral     SpO2 06/05/23 1451 (!) 84 %     Weight --      Height --      Head Circumference --      Peak Flow --      Pain Score 06/05/23 1454 0     Pain Loc --      Pain Education --      Exclude from Growth Chart --     Most recent vital signs: Vitals:   06/05/23 1900 06/05/23 1915  BP: (!) 159/80 (!) 142/77  Pulse: 81 (!) 111  Resp: (!) 22 19  Temp:    SpO2: (!) 89%     General: Alert and oriented, no distress.  CV:  Good peripheral perfusion.  Resp:  Normal effort.  Lungs CTAB. Abd:  Soft and nontender.  No distention.  Other:  Dry mucous membranes.  Motor and in all extremities.  Normal speech.   ED Results / Procedures / Treatments   Labs (all labs ordered are listed, but only abnormal results are  displayed) Labs Reviewed  COMPREHENSIVE METABOLIC PANEL - Abnormal; Notable for the following components:      Result Value   Glucose, Bld 123 (*)    GFR, Estimated 58 (*)    All other components within normal limits  BRAIN NATRIURETIC PEPTIDE - Abnormal; Notable for the following components:   B Natriuretic Peptide 593.4 (*)    All other components within normal limits  URINALYSIS, ROUTINE W REFLEX MICROSCOPIC - Abnormal; Notable for the following components:   Color, Urine YELLOW (*)    APPearance CLEAR (*)    Glucose, UA >=500 (*)    Hgb urine dipstick MODERATE (*)    Protein, ur 30 (*)    All other components within normal limits  LACTIC ACID, PLASMA - Abnormal; Notable for the following components:   Lactic Acid, Venous 2.0 (*)    All other components within normal limits  RESP PANEL BY RT-PCR (RSV, FLU A&B, COVID)  [id number]  C DIFFICILE QUICK SCREEN W PCR REFLEX    CBC WITH DIFFERENTIAL/PLATELET  LIPASE, BLOOD  LACTIC  ACID, PLASMA  MAGNESIUM  TROPONIN I (HIGH SENSITIVITY)  TROPONIN I (HIGH SENSITIVITY)     EKG  ED ECG REPORT I, Dionne Bucy, the attending physician, personally viewed and interpreted this ECG.  Date: 06/05/2023 EKG Time: 1452 Rate: 68 Rhythm: normal sinus rhythm QRS Axis: Right axis Intervals: normal ST/T Wave abnormalities: Nonspecific ST abnormalities Narrative Interpretation: no evidence of acute ischemia    RADIOLOGY  Chest x-ray: I independently viewed and interpreted the images; there is mild edema with no focal consolidation   PROCEDURES:  Critical Care performed: No  Procedures   MEDICATIONS ORDERED IN ED: Medications  albuterol (PROVENTIL) (2.5 MG/3ML) 0.083% nebulizer solution 2.5 mg (has no administration in time range)  dextromethorphan-guaiFENesin (MUCINEX DM) 30-600 MG per 12 hr tablet 1 tablet (has no administration in time range)  ondansetron (ZOFRAN) injection 4 mg (has no administration in time range)   hydrALAZINE (APRESOLINE) injection 5 mg (has no administration in time range)  acetaminophen (TYLENOL) tablet 650 mg (has no administration in time range)  insulin aspart (novoLOG) injection 0-9 Units (has no administration in time range)  insulin aspart (novoLOG) injection 0-5 Units (has no administration in time range)  furosemide (LASIX) injection 40 mg (40 mg Intravenous Given 06/05/23 1857)     IMPRESSION / MDM / ASSESSMENT AND PLAN / ED COURSE  I reviewed the triage vital signs and the nursing notes.  88 year old female with PMH as noted above presents with multiple diarrhea this morning, now with shortness of breath.  On exam her vital signs are normal except for O2 saturation in the 80s on room air (although the patient reports that she is supposed to be on oxygen but does not use it, so her baseline is unclear).  Lungs are clear to auscultation.  Abdomen is soft and nontender.  Differential diagnosis includes, but is not limited to, influenza, COVID, other viral syndrome, gastroenteritis, CHF exacerbation.  There is no clinical evidence for acute PE, and the patient is anticoagulated.  I have a low suspicion for bacterial pneumonia.  We will obtain chest x-ray, lab workup, and reassess.  Patient's presentation is most consistent with acute presentation with potential threat to life or bodily function.  The patient is on the cardiac monitor to evaluate for evidence of arrhythmia and/or significant heart rate changes.   ----------------------------------------- 7:19 PM on 06/05/2023 -----------------------------------------  Chest x-ray shows findings of CHF.  BNP is somewhat elevated.  Respiratory panel is negative.  The patient is continue to require 4 L of O2 by nasal cannula.  Overall presentation is consistent with CHF exacerbation.  I ordered IV Lasix.  The patient will need admission for further management.  I consulted Dr. Clyde Lundborg from the hospitalist service; based on our  discussion he agrees to evaluate the patient for admission.  FINAL CLINICAL IMPRESSION(S) / ED DIAGNOSES   Final diagnoses:  Acute on chronic respiratory failure with hypoxia (HCC)  Diarrhea, unspecified type     Rx / DC Orders   ED Discharge Orders     None        Note:  This document was prepared using Dragon voice recognition software and may include unintentional dictation errors.    Dionne Bucy, MD 06/05/23 Jerene Bears

## 2023-06-05 NOTE — Progress Notes (Deleted)
 MRN : [id number]  Samantha Freeman is a 88 y.o. (03/24/1936) female who presents with chief complaint of check circulation.  History of Present Illness:   The patient returns to the office for followup and review status post angiogram with intervention on 04/02/2023.   Procedure:  Thoracic aortogram and selective angiogram of the left external carotid artery and then the left maxillary artery 2.    Coil embolization of distal left maxillary artery with 3 mm Ruby coils  The patient notes improvement in the epistaxis. No interval shortening of the patient's claudication distance or rest pain symptoms. No new ulcers or wounds have occurred since the last visit.  There have been no significant changes to the patient's overall health care.  No documented history of amaurosis fugax or recent TIA symptoms. There are no recent neurological changes noted. No documented history of DVT, PE or superficial thrombophlebitis. The patient denies recent episodes of angina or shortness of breath.   ABI's Rt=*** and Lt=***  (previous ABI's Rt=*** and Lt=***) Duplex US of the *** lower extremity arterial system shows ***  No outpatient medications have been marked as taking for the 06/06/23 encounter (Appointment) with Gilda Crease, Latina Craver, MD.    Past Medical History:  Diagnosis Date   Anemia    Anxiety    Aortic stenosis, mild    Arthritis    CKD (chronic kidney disease), stage III (HCC)    Complication of anesthesia    Propofol anaphylaxis   Cortical cataract    DDD (degenerative disc disease), lumbar    Depression    Dyspnea    GERD (gastroesophageal reflux disease)    Headache    Heart murmur    History of 2019 novel coronavirus disease (COVID-19) 12/01/2018   HLD (hyperlipidemia)    Hypertension    Pneumonia    Schatzki's ring    Seasonal allergies    T2DM (type 2 diabetes mellitus) (HCC)    Valvular insufficiency      Past Surgical History:  Procedure Laterality Date   ABDOMINAL HYSTERECTOMY     APPENDECTOMY     BICEPT TENODESIS Right 05/13/2018   Procedure: BICEPS TENODESIS;  Surgeon: Christena Flake, MD;  Location: [facility];  Service: Orthopedics;  Laterality: Right;   CARDIAC CATHETERIZATION     COLONOSCOPY     EMBOLIZATION (CATH LAB) Bilateral 04/02/2023   Procedure: EMBOLIZATION;  Surgeon: Renford Dills, MD;  Location: [facility];  Service: Cardiovascular;  Laterality: Bilateral;   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   EYE SURGERY Right 09/2013   Corneal transplant   JOINT REPLACEMENT Right 2015   knee   REVERSE SHOULDER ARTHROPLASTY Left 10/13/2020   Procedure: REVERSE SHOULDER ARTHROPLASTY;  Surgeon: Christena Flake, MD;  Location: [facility];  Service: Orthopedics;  Laterality: Left;   SHOULDER ARTHROSCOPY WITH ROTATOR CUFF REPAIR Right 05/13/2018   Procedure: SHOULDER ARTHROSCOPY WITH DEBRIDEMENT, DECOMPRESSION AND ROTATOR CUFF REPAIR;  Surgeon: Christena Flake, MD;  Location: [facility];  Service: Orthopedics;  Laterality: Right;   TONSILLECTOMY      Social History  Social History   Tobacco Use   Smoking status: Never   Smokeless tobacco: Never  Vaping Use   Vaping status: Never Used  Substance Use Topics   Alcohol use: Never   Drug use: Never    Family History Family History  Problem Relation Age of Onset   Breast cancer Paternal Aunt     Allergies  Allergen Reactions   Buspirone     Other Reaction(s): Dizziness   Propofol Anaphylaxis   Zolpidem Other (See Comments)    Hallucinations   Cholestyramine Itching and Rash   Codeine Nausea Only   Hydrochlorothiazide Other (See Comments)    PATIENT DOES NOT REMEMBER   Loratadine Other (See Comments)    PATIENT DOES NOT REMEMBER   Niacin Rash     REVIEW OF SYSTEMS (Negative unless checked)  Constitutional: [] Weight loss  [] Fever  [] Chills Cardiac: [] Chest pain   [] Chest pressure   [] Palpitations    [] Shortness of breath when laying flat   [] Shortness of breath with exertion. Vascular:  [x] Pain in legs with walking   [] Pain in legs at rest  [] History of DVT   [] Phlebitis   [] Swelling in legs   [] Varicose veins   [] Non-healing ulcers Pulmonary:   [] Uses home oxygen   [] Productive cough   [] Hemoptysis   [] Wheeze  [] COPD   [] Asthma Neurologic:  [] Dizziness   [] Seizures   [] History of stroke   [] History of TIA  [] Aphasia   [] Vissual changes   [] Weakness or numbness in arm   [] Weakness or numbness in leg Musculoskeletal:   [] Joint swelling   [] Joint pain   [] Low back pain Hematologic:  [] Easy bruising  [] Easy bleeding   [] Hypercoagulable state   [] Anemic Gastrointestinal:  [] Diarrhea   [] Vomiting  [] Gastroesophageal reflux/heartburn   [] Difficulty swallowing. Genitourinary:  [] Chronic kidney disease   [] Difficult urination  [] Frequent urination   [] Blood in urine Skin:  [] Rashes   [] Ulcers  Psychological:  [] History of anxiety   []  History of major depression.  Physical Examination  There were no vitals filed for this visit. There is no height or weight on file to calculate BMI. Gen: WD/WN, NAD Head: [place]/AT, No temporalis wasting.  Ear/Nose/Throat: Hearing grossly intact, nares w/o erythema or drainage Eyes: PER, EOMI, sclera nonicteric.  Neck: Supple, no masses.  No bruit or JVD.  Pulmonary:  Good air movement, no audible wheezing, no use of accessory muscles.  Cardiac: RRR, normal S1, S2, no Murmurs. Vascular:  mild trophic changes, no open wounds Vessel Right Left  Radial Palpable Palpable  PT Not Palpable Not Palpable  DP Not Palpable Not Palpable  Gastrointestinal: soft, non-distended. No guarding/no peritoneal signs.  Musculoskeletal: M/S 5/5 throughout.  No visible deformity.  Neurologic: CN 2-12 intact. Pain and light touch intact in extremities.  Symmetrical.  Speech is fluent. Motor exam as listed above. Psychiatric: Judgment intact, Mood & affect appropriate for pt's clinical  situation. Dermatologic: No rashes or ulcers noted.  No changes consistent with cellulitis.   CBC Lab Results  Component Value Date   WBC 7.7 05/12/2023   HGB 13.5 05/12/2023   HCT 43.1 05/12/2023   MCV 94.3 05/12/2023   PLT 247 05/12/2023    BMET    Component Value Date/Time   NA 138 05/18/2023 0521   NA 140 02/15/2012 0348   K 3.7 05/18/2023 0521   K 3.7 02/15/2012 0348   CL 92 (L) 05/18/2023 0521   CL 105 02/15/2012 0348   CO2 32 05/18/2023 0521  CO2 28 02/15/2012 0348   GLUCOSE 226 (H) 05/18/2023 0521   GLUCOSE 207 (H) 02/15/2012 0348   BUN 33 (H) 05/18/2023 0521   BUN 14 02/15/2012 0348   CREATININE 1.10 (H) 05/18/2023 0521   CREATININE 0.87 02/15/2012 0348   CALCIUM 9.4 05/18/2023 0521   CALCIUM 8.6 02/15/2012 0348   GFRNONAA 49 (L) 05/18/2023 0521   GFRNONAA >60 02/15/2012 0348   GFRAA 55 (L) 09/15/2019 1353   GFRAA >60 02/15/2012 0348   CrCl cannot be calculated (Unknown ideal weight.).  COAG Lab Results  Component Value Date   INR 1.2 03/28/2023   INR 1.1 01/30/2023   INR 1.0 09/15/2019    Radiology CT CHEST WO CONTRAST Result Date: 05/16/2023 CLINICAL DATA:  Persistent cough. EXAM: CT CHEST WITHOUT CONTRAST TECHNIQUE: Multidetector CT imaging of the chest was performed following the standard protocol without IV contrast. RADIATION DOSE REDUCTION: This exam was performed according to the departmental dose-optimization program which includes automated exposure control, adjustment of the mA and/or kV according to patient size and/or use of iterative reconstruction technique. COMPARISON:  Chest CT 03/28/2023 FINDINGS: Cardiovascular: Chronic cardiomegaly. No pericardial effusion. Coronary artery and aortic valvular calcifications. Aortic atherosclerosis without aneurysm. Dilated central pulmonary arteries with 3.5 cm. Mediastinum/Nodes: Shotty mediastinal lymph nodes are similar to prior exam. For example 12 mm anterior paratracheal node series 2, image 54. No  progressive adenopathy. No visible thyroid nodule. Decompressed esophagus. Lungs/Pleura: Resolved left pleural effusion from prior. Trace right pleural effusion has diminished. Improved bibasilar opacities. Residual bandlike opacities in the dependent lower lobes, greater on the right. Improvement in bronchial thickening and ground-glass opacities. Mild smooth septal thickening persists. The previous 10 mm left upper lobe nodule has resolved. No new or enlarging pulmonary nodules. No new airspace disease. Upper Abdomen: No acute findings. Musculoskeletal: Mild thoracic spondylosis with anterior spurring. There are no acute or suspicious osseous abnormalities. Left shoulder arthroplasty. IMPRESSION: 1. Resolved left pleural effusion diminished right pleural effusion from last month, minimal right pleural effusion persists. 2. Improvement in bibasilar opacities from prior. Residual bandlike opacities in the dependent lower lobes, right greater than left, favors atelectasis. 3. Improvement in bronchial thickening and ground-glass opacities. Mild residual septal thickening persists. Fever improvement in pulmonary edema with mild residual. 4. Resolved left upper lobe pulmonary nodule from prior exam. 5. Similar shotty mediastinal lymph nodes without progression, favored to be reactive. 6. Chronic cardiomegaly.  Coronary artery calcifications. Aortic Atherosclerosis (ICD10-I70.0). Electronically Signed   By: Narda Rutherford M.D.   On: 05/16/2023 11:46   DG Chest 2 View Result Date: 05/11/2023 CLINICAL DATA:  Short of breath. Diagnosed with atrial fibrillation 3 weeks ago. Nonproductive cough. EXAM: CHEST - 2 VIEW COMPARISON:  Chest CT, 05/08/2023.  Chest radiographs, 03/28/2023. FINDINGS: Mild enlargement the cardiac silhouette. No mediastinal or hilar masses. No evidence of adenopathy. Small bilateral pleural effusions. Bilateral interstitial thickening most evident in the lower lungs. Additional posterior lung base  opacities are noted that are suspected to be atelectasis. No pneumothorax. Left reverse shoulder prosthesis appears well positioned and aligned and unchanged IMPRESSION: Findings consistent with congestive heart failure with interstitial pulmonary edema and small bilateral pleural effusions. Electronically Signed   By: Amie Portland M.D.   On: 05/11/2023 13:13     Assessment/Plan There are no diagnoses linked to this encounter.   Levora Dredge, MD  06/05/2023 11:32 AM

## 2023-06-05 NOTE — Progress Notes (Signed)
Heart Failure Navigator Progress Note  Assessed for Heart & Vascular TOC clinic readiness.  Patient does not meet criteria due to current [facility] patient of Dr. Cephas Darby, MD.  Navigator will sign off at this time.  Roxy Horseman, RN, BSN [facility] Heart Failure Navigator Secure Chat Only

## 2023-06-05 NOTE — ED Notes (Signed)
Humidity placed to wall supply O2.

## 2023-06-05 NOTE — ED Notes (Signed)
Pt placed on 2L BNC with oxygen improving to 100% . Pt states she feels better.

## 2023-06-05 NOTE — H&P (Signed)
 History and Physical    Samantha Freeman ZOX:[id number] DOB: [birth year] DOA: 06/05/2023  Referring MD/NP/PA:   PCP: Marguarite Arbour, MD   Patient coming from:  The patient is coming from home.     Chief Complaint: SOB  HPI: Samantha Freeman is a 88 y.o. female with medical history significant of A-fib and PE on Eliquis, HTN, HLD, DM, dCHF, CKD-3A, IBS, depression with anxiety, who presents with SOB.  Patient states that he has shortness of breath in the past several days, which has been progressively worsening.  He has dry cough, generalized weakness, no chest pain, fever or chills.  Patient states that she has history of IBS, with intermittent diarrhea.  She has diarrhea this morning.  No nausea, vomiting, abdominal pain.  No symptoms of UTI.  Patient states that he she is supposed to use oxygen, but not using it at home currently.  She was found to have oxygen desaturation to 84% on room air, which improved to lower 90s on 4 L oxygen ED.  Data reviewed independently and ED Course: pt was found to have BNP 593, troponin level 10 --> 9, lactic acid 2.0 --> 1.1, WBC 9.8, stable renal function, negative urinalysis, negative PCR for COVID, flu and RSV.  Temperature normal, blood pressure 121/80, heart rate of 59 --> 70, RR 19.  Chest x-ray showed pulmonary edema.  Patient is admitted to PCU as inpatient.   EKG: I have personally reviewed.  A-fib, QTc 342, RAD, poor R wave progression.   Review of Systems:   General: no fevers, chills, no body weight gain, has fatigue HEENT: no blurry vision, hearing changes or sore throat Respiratory: has dyspnea, coughing, no wheezing CV: no chest pain, no palpitations GI: no nausea, vomiting, abdominal pain, has diarrhea GU: no dysuria, burning on urination, increased urinary frequency, hematuria  Ext: has leg edema Neuro: no unilateral weakness, numbness, or tingling, no vision change or hearing loss Skin: no rash, no skin tear. MSK: No muscle  spasm, no deformity, no limitation of range of movement in spin Heme: No easy bruising.  Travel history: No recent long distant travel.   Allergy:  Allergies  Allergen Reactions   Buspirone     Other Reaction(s): Dizziness   Propofol Anaphylaxis   Zolpidem Other (See Comments)    Hallucinations   Cholestyramine Itching and Rash   Codeine Nausea Only   Hydrochlorothiazide Other (See Comments)    PATIENT DOES NOT REMEMBER   Loratadine Other (See Comments)    PATIENT DOES NOT REMEMBER   Niacin Rash    Past Medical History:  Diagnosis Date   Anemia    Anxiety    Aortic stenosis, mild    Arthritis    CKD (chronic kidney disease), stage III (HCC)    Complication of anesthesia    Propofol anaphylaxis   Cortical cataract    DDD (degenerative disc disease), lumbar    Depression    Dyspnea    GERD (gastroesophageal reflux disease)    Headache    Heart murmur    History of 2019 novel coronavirus disease (COVID-19) 12/01/2018   HLD (hyperlipidemia)    Hypertension    Pneumonia    Schatzki's ring    Seasonal allergies    T2DM (type 2 diabetes mellitus) (HCC)    Valvular insufficiency     Past Surgical History:  Procedure Laterality Date   ABDOMINAL HYSTERECTOMY     APPENDECTOMY     BICEPT TENODESIS Right 05/13/2018   Procedure: BICEPS  TENODESIS;  Surgeon: Christena Flake, MD;  Location: [facility] ORS;  Service: Orthopedics;  Laterality: Right;   CARDIAC CATHETERIZATION     COLONOSCOPY     EMBOLIZATION (CATH LAB) Bilateral 04/02/2023   Procedure: EMBOLIZATION;  Surgeon: Renford Dills, MD;  Location: [facility] INVASIVE CV LAB;  Service: Cardiovascular;  Laterality: Bilateral;   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   EYE SURGERY Right 09/2013   Corneal transplant   JOINT REPLACEMENT Right 2015   knee   REVERSE SHOULDER ARTHROPLASTY Left 10/13/2020   Procedure: REVERSE SHOULDER ARTHROPLASTY;  Surgeon: Christena Flake, MD;  Location: [facility] ORS;  Service: Orthopedics;   Laterality: Left;   SHOULDER ARTHROSCOPY WITH ROTATOR CUFF REPAIR Right 05/13/2018   Procedure: SHOULDER ARTHROSCOPY WITH DEBRIDEMENT, DECOMPRESSION AND ROTATOR CUFF REPAIR;  Surgeon: Christena Flake, MD;  Location: [facility] ORS;  Service: Orthopedics;  Laterality: Right;   TONSILLECTOMY      Social History:  reports that she has never smoked. She has never used smokeless tobacco. She reports that she does not drink alcohol and does not use drugs.  Family History:  Family History  Problem Relation Age of Onset   Breast cancer Paternal Aunt      Prior to Admission medications   Medication Sig Start Date End Date Taking? Authorizing Provider  ALPRAZolam Prudy Feeler) 0.5 MG tablet Take 0.5 mg by mouth at bedtime as needed for anxiety or sleep.    [provider]  apixaban (ELIQUIS) 5 MG TABS tablet Take 2 tablets (10 mg total) by mouth 2 (two) times daily for 3 days, THEN 1 tablet (5 mg total) 2 (two) times daily for 27 days. 04/08/23 05/12/23  Alford Highland, MD  benzonatate (TESSALON) 200 MG capsule Take by mouth. 04/22/23   [provider]  blood glucose meter kit and supplies KIT Dispense based on patient and insurance preference. Use up to four times daily as directed. (FOR ICD-9 250.00, 250.01). For QAC - HS accuchecks. 12/11/18   Leroy Sea, MD  Calcium Carb-Cholecalciferol (CALCIUM 600/VITAMIN D3 PO) Take 1 tablet by mouth daily.    [provider]  Coenzyme Q10 10 MG capsule Take 10 mg by mouth every morning.    [provider]  dapagliflozin propanediol (FARXIGA) 10 MG TABS tablet Take 10 mg by mouth daily. 11/03/20   [provider]  digoxin (LANOXIN) 0.125 MG tablet Take 1 tablet (0.125 mg total) by mouth every other day. 03/14/23 05/12/23  Charise Killian, MD  diltiazem (CARDIZEM CD) 180 MG 24 hr capsule Take 180 mg by mouth daily. 05/01/23   [provider]  feeding supplement, GLUCERNA SHAKE, (GLUCERNA SHAKE) LIQD Take 237 mLs by  mouth 3 (three) times daily between meals. 04/08/23   Alford Highland, MD  furosemide (LASIX) 20 MG tablet Take 1 tablet (20 mg total) by mouth daily. 04/09/23   Alford Highland, MD  gabapentin (NEURONTIN) 100 MG capsule Take 200 mg by mouth 3 (three) times daily.    [provider]  glucose blood (FREESTYLE LITE) test strip For glucose testing every before meals at bedtime. Diagnosis E 11.65  Can substitute to any accepted brand 12/11/18   Leroy Sea, MD  insulin lispro (HUMALOG) 100 UNIT/ML injection Inject 7-19 Units into the skin 3 (three) times daily before meals. Blood Glucose level: 140-199 - 7 units, 200-250 - 9 units, 251-299 - 13 units,  300-349 - 17 units,  350 or above 19 units.    [provider]  Insulin Syringe-Needle U-100 25G X 1" 1 ML MISC For 4 times a day insulin SQ, 1 month supply. Diagnosis E11.65 12/11/18   Leroy Sea, MD  LANTUS SOLOSTAR 100 UNIT/ML Solostar Pen Inject 30 Units into the skin at bedtime. 04/08/23   Alford Highland, MD  magnesium oxide (MAG-OX) 400 (240 Mg) MG tablet Take 2 tablets by mouth 3 (three) times daily. 03/27/23   [provider]  metoprolol tartrate (LOPRESSOR) 50 MG tablet Take 1 tablet (50 mg total) by mouth 2 (two) times daily. 03/12/23 04/11/23  Charise Killian, MD  Multiple Vitamin (MULTIVITAMIN) tablet Take 1 tablet by mouth daily. Women's Daily Multivitamin    [provider]  pantoprazole (PROTONIX) 40 MG tablet Take 1 tablet (40 mg total) by mouth daily. 02/05/23   Jonah Blue, MD  PARoxetine (PAXIL) 10 MG tablet Take 10 mg by mouth daily. 02/22/23   [provider]  prednisoLONE acetate (PRED FORTE) 1 % ophthalmic suspension Place 1 drop into both eyes at bedtime. 10/04/17   [provider]  rOPINIRole (REQUIP) 1 MG tablet Take 1 mg by mouth at bedtime. 03/26/23   [provider]  rosuvastatin (CRESTOR) 10 MG tablet Take 10 mg by mouth daily.    [provider]  sodium chloride (OCEAN) 0.65 % SOLN nasal spray Place 2 sprays into both nostrils 4 (four) times daily. 04/08/23   Alford Highland, MD  spironolactone (ALDACTONE) 25 MG tablet Take 1 tablet (25 mg total) by mouth daily. 05/19/23   Marrion Coy, MD  vitamin B-12 (CYANOCOBALAMIN) 1000 MCG tablet Take 1,000 mcg by mouth daily.    [provider]    Physical Exam: Vitals:   06/05/23 1900 06/05/23 1901 06/05/23 1915 06/05/23 2033  BP: (!) 159/80  (!) 142/77 (!) 138/94  Pulse: 81 67 (!) 111 (!) 57  Resp: (!) 22 (!) 22 19 (!) 23  Temp:    97.9 F (36.6 C)  TempSrc:    Oral  SpO2: (!) 89% 93%  92%  Weight:      Height:       General: Not in acute distress HEENT:       Eyes: PERRL, EOMI, no jaundice       ENT: No discharge from the ears and nose, no pharynx injection, no tonsillar enlargement.        Neck: Positive JVD, no bruit, no mass felt. Heme: No neck lymph node enlargement. Cardiac: S1/S2, irregularly irregular rhythm, no gallops or rubs. Respiratory: Has fine crackles bilaterally GI: Soft, nondistended, nontender, no rebound pain, no organomegaly, BS present. GU: No hematuria Ext: Has 1+ pitting leg edema bilaterally. 1+DP/PT pulse bilaterally. Musculoskeletal: No joint deformities, No joint redness or warmth, no limitation of ROM in spin. Skin: No rashes.  Neuro: Alert, oriented X3, cranial nerves II-XII grossly intact, moves all extremities normally.  Psych: Patient is not psychotic, no suicidal or hemocidal ideation.  Labs on Admission: I have personally reviewed following labs and imaging studies  CBC: Recent Labs  Lab 06/05/23 1514  WBC 9.8  NEUTROABS 5.6  HGB 13.9  HCT 44.3  MCV 90.0  PLT 291   Basic Metabolic Panel: Recent Labs  Lab 06/05/23 1514  NA 138  K 4.0  CL 98  CO2 27  GLUCOSE 123*  BUN 17  CREATININE 0.96  CALCIUM 9.1   GFR: Estimated Creatinine Clearance: 42.8 mL/min (by C-G formula based on SCr of 0.96  mg/dL). Liver Function Tests: Recent  Labs  Lab 06/05/23 1514  AST 19  ALT 14  ALKPHOS 61  BILITOT 1.1  PROT 7.1  ALBUMIN 3.6   Recent Labs  Lab 06/05/23 1514  LIPASE 26   No results for input(s): "AMMONIA" in the last 168 hours. Coagulation Profile: No results for input(s): "INR", "PROTIME" in the last 168 hours. Cardiac Enzymes: No results for input(s): "CKTOTAL", "CKMB", "CKMBINDEX", "TROPONINI" in the last 168 hours. BNP (last 3 results) No results for input(s): "PROBNP" in the last 8760 hours. HbA1C: No results for input(s): "HGBA1C" in the last 72 hours. CBG: Recent Labs  Lab 06/05/23 2202  GLUCAP 164*   Lipid Profile: No results for input(s): "CHOL", "HDL", "LDLCALC", "TRIG", "CHOLHDL", "LDLDIRECT" in the last 72 hours. Thyroid Function Tests: No results for input(s): "TSH", "T4TOTAL", "FREET4", "T3FREE", "THYROIDAB" in the last 72 hours. Anemia Panel: No results for input(s): "VITAMINB12", "FOLATE", "FERRITIN", "TIBC", "IRON", "RETICCTPCT" in the last 72 hours. Urine analysis:    Component Value Date/Time   COLORURINE YELLOW (A) 06/05/2023 1533   APPEARANCEUR CLEAR (A) 06/05/2023 1533   APPEARANCEUR Hazy 01/28/2012 0923   LABSPEC 1.008 06/05/2023 1533   LABSPEC 1.017 01/28/2012 0923   PHURINE 6.0 06/05/2023 1533   GLUCOSEU >=500 (A) 06/05/2023 1533   GLUCOSEU >=500 01/28/2012 0923   HGBUR MODERATE (A) 06/05/2023 1533   BILIRUBINUR NEGATIVE 06/05/2023 1533   KETONESUR NEGATIVE 06/05/2023 1533   PROTEINUR 30 (A) 06/05/2023 1533   NITRITE NEGATIVE 06/05/2023 1533   LEUKOCYTESUR NEGATIVE 06/05/2023 1533   LEUKOCYTESUR Trace 01/28/2012 0923   Sepsis Labs: @LABRCNTIP (procalcitonin:4,lacticidven:4) ) Recent Results (from the past 240 hours)  Resp panel by RT-PCR (RSV, Flu A&B, Covid) Anterior Nasal Swab     Status: None   Collection Time: 06/05/23  3:14 PM   Specimen: Anterior Nasal Swab  Result Value Ref Range Status   SARS Coronavirus 2 by RT PCR  NEGATIVE NEGATIVE Final    Comment: (NOTE) SARS-CoV-2 target nucleic acids are NOT DETECTED.  The SARS-CoV-2 RNA is generally detectable in upper respiratory specimens during the acute phase of infection. The lowest concentration of SARS-CoV-2 viral copies this assay can detect is 138 copies/mL. A negative result does not preclude SARS-Cov-2 infection and should not be used as the sole basis for treatment or other patient management decisions. A negative result may occur with  improper specimen collection/handling, submission of specimen other than nasopharyngeal swab, presence of viral mutation(s) within the areas targeted by this assay, and inadequate number of viral copies(<138 copies/mL). A negative result must be combined with clinical observations, patient history, and epidemiological information. The expected result is Negative.  Fact Sheet for Patients:  BloggerCourse.com  Fact Sheet for Healthcare Providers:  SeriousBroker.it  This test is no t yet approved or cleared by the [place] FDA and  has been authorized for detection and/or diagnosis of SARS-CoV-2 by FDA under an Emergency Use Authorization (EUA). This EUA will remain  in effect (meaning this test can be used) for the duration of the COVID-19 declaration under Section 564(b)(1) of the Act, 21 U.S.C.section 360bbb-3(b)(1), unless the authorization is terminated  or revoked sooner.       Influenza A by PCR NEGATIVE NEGATIVE Final   Influenza B by PCR NEGATIVE NEGATIVE Final    Comment: (NOTE) The Xpert Xpress SARS-CoV-2/FLU/RSV plus assay is intended as an aid in the diagnosis of influenza from Nasopharyngeal swab specimens and should not be used as a sole basis for treatment. Nasal washings and aspirates are unacceptable  for Xpert Xpress SARS-CoV-2/FLU/RSV testing.  Fact Sheet for Patients: BloggerCourse.com  Fact Sheet for  Healthcare Providers: SeriousBroker.it  This test is not yet approved or cleared by the [place] FDA and has been authorized for detection and/or diagnosis of SARS-CoV-2 by FDA under an Emergency Use Authorization (EUA). This EUA will remain in effect (meaning this test can be used) for the duration of the COVID-19 declaration under Section 564(b)(1) of the Act, 21 U.S.C. section 360bbb-3(b)(1), unless the authorization is terminated or revoked.     Resp Syncytial Virus by PCR NEGATIVE NEGATIVE Final    Comment: (NOTE) Fact Sheet for Patients: BloggerCourse.com  Fact Sheet for Healthcare Providers: SeriousBroker.it  This test is not yet approved or cleared by the [place] FDA and has been authorized for detection and/or diagnosis of SARS-CoV-2 by FDA under an Emergency Use Authorization (EUA). This EUA will remain in effect (meaning this test can be used) for the duration of the COVID-19 declaration under Section 564(b)(1) of the Act, 21 U.S.C. section 360bbb-3(b)(1), unless the authorization is terminated or revoked.  Performed at [facility], [address], [place], [place] [address]      Radiological Exams on Admission:   Assessment/Plan Principal Problem:   Acute on chronic diastolic CHF (congestive heart failure) (HCC) Active Problems:   HTN (hypertension)   HLD (hyperlipidemia)   Type II diabetes mellitus with renal manifestations (HCC)   Chronic atrial fibrillation (HCC)   History of pulmonary embolus (PE)   Chronic kidney disease, stage 3a (HCC)   Diarrhea   Anxiety and depression   Overweight (BMI 25.0-29.9)   Assessment and Plan:  Acute on chronic diastolic CHF (congestive heart failure) [facility]): Patient has SOB, 1+ leg edema, elevated BNP 593, crackles on auscultation, positive JVD, clinically consistent with CHF exacerbation.  2D echo on 01/30/2023 showed EF 60  to 65%.  Patient has a 4L of new oxygen requirement, but no respiratory distress currently.  -Will admit to PCU as inpatient -Lasix 40 mg bid by IV -Continue home spironolactone -2d echo -Daily weights -strict I/O's -Low salt diet -Fluid restriction -As needed bronchodilators for shortness of breath  HTN (hypertension) -IV hydralazine as needed -Cardizem, metoprolol, spironolactone -Patient is on IV Lasix  HLD (hyperlipidemia): Patient's not taking Crestor -Fenofibrate  Type II diabetes mellitus with renal manifestations (HCC): 2D echo 6.7 recently, poorly controlled.  Patient is taking Farxiga, NovoLog, Lantus 40 unit daily -Sliding scale insulin -Glargine insulin 20 units daily  Chronic atrial fibrillation (HCC): Heart rate 50-70s -Cardizem, metoprolol, digoxin -Eliquis  History of pulmonary embolus (PE) -Eliquis  Chronic kidney disease, stage 3a (HCC): Renal function stable.  Creatinine 0.96, BUN 17, GFR 58 -Follow-up by BMP  Diarrhea: Most likely due to IBS.  No abdominal pain, no fever or leukocytosis.  Low suspicions for C. Difficile -As needed Imodium  Anxiety and depression -Continue home medications  Overweight (BMI 25.0-29.9): Body weight 78.9 kg, BMI 29.87 -Encourage losing weight -Exercise and healthy diet    DVT ppx: on Eliquis   Code Status: DNR per pt  Family Communication:     not done, no family member is at bed side.         Disposition Plan:  Anticipate discharge back to previous environment  Consults called:  none  Admission status and Level of care: Progressive:   as inpt        Dispo: The patient is from: Home  Anticipated d/c is to: Home              Anticipated d/c date is: 2 days              Patient currently is not medically stable to d/c.    Severity of Illness:  The appropriate patient status for this patient is INPATIENT. Inpatient status is judged to be reasonable and necessary in order to provide the  required intensity of service to ensure the patient's safety. The patient's presenting symptoms, physical exam findings, and initial radiographic and laboratory data in the context of their chronic comorbidities is felt to place them at high risk for further clinical deterioration. Furthermore, it is not anticipated that the patient will be medically stable for discharge from the hospital within 2 midnights of admission.   * I certify that at the point of admission it is my clinical judgment that the patient will require inpatient hospital care spanning beyond 2 midnights from the point of admission due to high intensity of service, high risk for further deterioration and high frequency of surveillance required.*       Date of Service 06/06/2023    Lorretta Harp [facility]   If 7PM-7AM, please contact night-coverage [email or web address] 06/06/2023, 12:01 AM

## 2023-06-05 NOTE — ED Notes (Signed)
ED Provider at bedside.

## 2023-06-05 NOTE — ED Triage Notes (Signed)
Pt here with sob and diarrhea since last night. Pt states she has a hx of IBS and is very weak. Pt having a hard time catching her breath when she gets up but recovers when she is able to rest. Pt denies CP.

## 2023-06-06 ENCOUNTER — Encounter: Payer: Self-pay | Admitting: Internal Medicine

## 2023-06-06 ENCOUNTER — Other Ambulatory Visit: Payer: Medicare Other

## 2023-06-06 ENCOUNTER — Ambulatory Visit (INDEPENDENT_AMBULATORY_CARE_PROVIDER_SITE_OTHER): Payer: Medicare Other | Admitting: Vascular Surgery

## 2023-06-06 ENCOUNTER — Inpatient Hospital Stay
Admit: 2023-06-06 | Discharge: 2023-06-06 | Disposition: A | Discharge status: Home / Self Care | Payer: Medicare Other | Attending: Internal Medicine | Admitting: Internal Medicine

## 2023-06-06 DIAGNOSIS — I10 Essential (primary) hypertension: Secondary | ICD-10-CM

## 2023-06-06 DIAGNOSIS — I5033 Acute on chronic diastolic (congestive) heart failure: Secondary | ICD-10-CM | POA: Diagnosis not present

## 2023-06-06 DIAGNOSIS — I4821 Permanent atrial fibrillation: Secondary | ICD-10-CM

## 2023-06-06 DIAGNOSIS — E1122 Type 2 diabetes mellitus with diabetic chronic kidney disease: Secondary | ICD-10-CM

## 2023-06-06 DIAGNOSIS — Z86711 Personal history of pulmonary embolism: Secondary | ICD-10-CM

## 2023-06-06 DIAGNOSIS — R040 Epistaxis: Secondary | ICD-10-CM

## 2023-06-06 LAB — BASIC METABOLIC PANEL
Anion gap: 13 (ref 5–15)
BUN: 17 mg/dL (ref 8–23)
CO2: 29 mmol/L (ref 22–32)
Calcium: 9 mg/dL (ref 8.9–10.3)
Chloride: 96 mmol/L — ABNORMAL LOW (ref 98–111)
Creatinine, Ser: 0.88 mg/dL (ref 0.44–1.00)
GFR, Estimated: >60 mL/min (ref >60)
Glucose, Bld: 136 mg/dL — ABNORMAL HIGH (ref 70–99)
Potassium: 3.5 mmol/L (ref 3.5–5.1)
Sodium: 138 mmol/L (ref 135–145)

## 2023-06-06 LAB — CBC
HCT: 40.8 % (ref 36.0–46.0)
Hemoglobin: 12.9 g/dL (ref 12.0–15.0)
MCH: 28.5 pg (ref 26.0–34.0)
MCHC: 31.6 g/dL (ref 30.0–36.0)
MCV: 90.1 fL (ref 80.0–100.0)
Platelets: 243 10*3/uL (ref 150–400)
RBC: 4.53 MIL/uL (ref 3.87–5.11)
RDW: 14.3 % (ref 11.5–15.5)
WBC: 8.2 10*3/uL (ref 4.0–10.5)
nRBC: 0 % (ref 0.0–0.2)

## 2023-06-06 LAB — ECHOCARDIOGRAM COMPLETE
AR max vel: 1.06 cm2
AV Area VTI: 1.12 cm2
AV Area mean vel: 1.07 cm2
AV Mean grad: 15.3 mm[Hg]
AV Peak grad: 27.5 mm[Hg]
Ao pk vel: 2.62 m/s
Area-P 1/2: 3.85 cm2
Height: 64 in
S' Lateral: 2.2 cm
Weight: 2784 [oz_av]

## 2023-06-06 LAB — MAGNESIUM: Magnesium: 1.7 mg/dL (ref 1.7–2.4)

## 2023-06-06 LAB — CBG MONITORING, ED
Glucose-Capillary: 143 mg/dL — ABNORMAL HIGH (ref 70–99)
Glucose-Capillary: 290 mg/dL — ABNORMAL HIGH (ref 70–99)
Glucose-Capillary: 308 mg/dL — ABNORMAL HIGH (ref 70–99)
Glucose-Capillary: 167 mg/dL — ABNORMAL HIGH (ref 70–99)
Glucose-Capillary: 255 mg/dL — ABNORMAL HIGH (ref 70–99)

## 2023-06-06 MED ORDER — ROPINIROLE HCL 1 MG PO TABS
1.0000 mg | ORAL_TABLET | Freq: Every day | ORAL | Status: DC
  Administered 2023-06-06 – 2023-06-11 (×6): 1 mg via ORAL
  Filled 2023-06-06 (×7): qty 1

## 2023-06-06 NOTE — TOC Initial Note (Signed)
Transition of Care [facility]) - Initial/Assessment Note    Patient Details  Name: Samantha Freeman MRN: [id number] Date of Birth: April 28, 1935  [facility]) CM/SW Contact:    Tory Emerald, LCSW Phone Number: 06/06/2023, 12:39 PM  Clinical Narrative:                  Pt from home, living independently. Pt has an established PCP and pharmacy and reports her cousin or cousin's spouse transport her to doctor's apt. Pt reports she currently has home health services with [facility]. CSW attempted to contact Kelsy at [facility] to inform her of pt's hospitalization; no answer. Pt plans to resume services, post d/c from hospital.        Patient Goals and CMS Choice            Expected Discharge Plan and Services                                              Prior Living Arrangements/Services                       Activities of Daily Living   ADL Screening (condition at time of admission) Independently performs ADLs?: No Does the patient have a NEW difficulty with bathing/dressing/toileting/self-feeding that is expected to last >3 days?: No Does the patient have a NEW difficulty with getting in/out of bed, walking, or climbing stairs that is expected to last >3 days?: No Does the patient have a NEW difficulty with communication that is expected to last >3 days?: No Is the patient deaf or have difficulty hearing?: No Does the patient have difficulty seeing, even when wearing glasses/contacts?: No Does the patient have difficulty concentrating, remembering, or making decisions?: No  Permission Sought/Granted                  Emotional Assessment              Admission diagnosis:  Acute on chronic diastolic CHF (congestive heart failure) (HCC) [I50.33] Patient Active Problem List   Diagnosis Date Noted   History of pulmonary embolus (PE) 06/05/2023   Acute on chronic diastolic CHF (congestive heart failure) (HCC) 06/05/2023   HLD (hyperlipidemia)  06/05/2023   Chronic kidney disease, stage 3a (HCC) 06/05/2023   Type II diabetes mellitus with renal manifestations (HCC) 06/05/2023   Diarrhea 06/05/2023   Overweight (BMI 25.0-29.9) 06/05/2023   Nocturnal hypoxia 04/08/2023   Acute kidney injury superimposed on CKD (HCC) 04/07/2023   Acute metabolic encephalopathy 04/07/2023   Pulmonary embolism (HCC) 04/07/2023   Uncontrolled type 2 diabetes mellitus with hyperglycemia, with long-term current use of insulin (HCC) 04/07/2023   Chest pain 04/07/2023   Epistaxis 04/01/2023   Hypercholesteremia 02/25/2023   Acute heart failure with preserved ejection fraction (HFpEF) (HCC) 02/25/2023   Generalized weakness 02/25/2023   Chronic atrial fibrillation (HCC) 02/02/2023   Dyslipidemia 02/02/2023   Anxiety and depression 02/02/2023   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 with body mass index (BMI) of 33.0 to 33.9 in adult 02/02/2023   Acute hypoxic respiratory failure (HCC) 01/30/2023   Status post reverse arthroplasty of shoulder, left 10/13/2020   Mild aortic stenosis 05/23/2020   Moderate pulmonary valve regurgitation 05/23/2020   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   Polyneuropathy associated with underlying disease (HCC) 03/13/2019  Acute respiratory disease due to COVID-19 virus 12/06/2018   Pneumonia due to COVID-19 virus 12/06/2018   HTN (hypertension) 12/06/2018   PNA (pneumonia) 12/06/2018   Rotator cuff tear 05/13/2018   Type 2 diabetes mellitus (HCC) 06/24/2015   Moderate mitral insufficiency 03/16/2015   Moderate tricuspid insufficiency 03/16/2015   GERD (gastroesophageal reflux disease) 09/22/2013   Fuchs' corneal dystrophy 08/19/2013   PCP:  Marguarite Arbour, MD Pharmacy:   [facility] [phone] [place], [place] - [address] AT [facility] OF [address] [place] [address] [place] [address] Phone: [phone] Fax: [phone]  [facility] [address] [place] [address] Phone: [phone] Fax: [phone]     Social Drivers of Health (SDOH) Social History: SDOH Screenings   Food Insecurity: No Food Insecurity (06/06/2023)  Housing: Low Risk  (06/06/2023)  Transportation Needs: No Transportation Needs (06/06/2023)  Utilities: Not At Risk (06/06/2023)  Financial Resource Strain: Low Risk  (05/24/2023)   Received from [facility]  Social Connections: Moderately Integrated (06/06/2023)  Tobacco Use: Low Risk  (06/06/2023)   SDOH Interventions:     Readmission Risk Interventions    06/06/2023   12:35 PM 05/13/2023   10:56 AM 03/28/2023    4:20 PM  Readmission Risk Prevention Plan  Transportation Screening Complete Complete Complete  PCP or Specialist Appt within 3-5 Days Complete Complete Complete  HRI or Home Care Consult Complete Complete Complete  Social Work Consult for Recovery Care Planning/Counseling Complete Complete Complete  Palliative Care Screening Not Applicable Not Applicable Not Applicable  Medication Review Oceanographer) Complete Complete Complete

## 2023-06-06 NOTE — Evaluation (Signed)
Physical Therapy Evaluation Patient Details Name: Samantha Freeman MRN: [id number] DOB: 1935/09/04 Today's Date: 06/06/2023  History of Present Illness  Pt is an 88 y.o. female presenting to hospital 06/05/23 with c/o SOB, weakness, and diarrhea. Recent hospitalization with acute hypoxic respiratory failure d/t CHF exacerbation.  Pt admitted with acute on chronic diastolic CHF, htn, HLD, DM, chronic a-fib, diarrhea.  PMH includes a-fib, PE, htn, HLD, DM, dCHF, CKD-3A, IBS, depression with anxiety, biceps tenodesis 05/13/2018, R TKR, L reverse shoulder arthroplasty 2022; R shoulder arthroscopy with RCR 2020.  Clinical Impression  Prior to recent medical concerns, pt was ambulatory (held onto furniture or used rollator within home; uses [facility] in community); lives alone in 1 level home--level entry condo.  No c/o pain during session.  Currently pt is SBA with transfer from bed and CGA to take x10 steps in place B LE's (pt reporting B LE's feeling weak in general and mild SOB noted; pt steady with RW use).  SpO2 sats 91% or greater on 6 L O2 via nasal cannula during sessions activities.  Nurse updated on pt's vitals and overall status.  Pt would currently benefit from skilled PT to address noted impairments and functional limitations (see below for any additional details).  Upon hospital discharge, pt would benefit from ongoing therapy.     If plan is discharge home, recommend the following: A little help with walking and/or transfers;A little help with bathing/dressing/bathroom;Assistance with cooking/housework;Assist for transportation;Help with stairs or ramp for entrance   Can travel by private vehicle    Yes    Equipment Recommendations Rolling walker (2 wheels)  Recommendations for Other Services  OT consult    Functional Status Assessment Patient has had a recent decline in their functional status and demonstrates the ability to make significant improvements in function in a reasonable and  predictable amount of time.     Precautions / Restrictions Precautions Precautions: Fall Restrictions Weight Bearing Restrictions Per Provider Order: No      Mobility  Bed Mobility Overal bed mobility: Modified Independent             General bed mobility comments: No difficulties noted semi-supine to sitting EOB    Transfers Overall transfer level: Needs assistance Equipment used: Rolling walker (2 wheels) Transfers: Sit to/from Stand Sit to Stand: Supervision           General transfer comment: steady transfer from bed using RW; vc's for UE placement    Ambulation/Gait Ambulation/Gait assistance: Contact guard assist Gait Distance (Feet):  (x10 steps in place B LE's) Assistive device: Rolling walker (2 wheels)   Gait velocity: decreased     General Gait Details: steady taking steps in place with B UE support on RW  Stairs            Wheelchair Mobility     Tilt Bed    Modified Rankin (Stroke Patients Only)       Balance Overall balance assessment: Needs assistance Sitting-balance support: No upper extremity supported, Feet supported Sitting balance-Leahy Scale: Good Sitting balance - Comments: steady reaching within BOS   Standing balance support: Bilateral upper extremity supported, During functional activity, Reliant on assistive device for balance Standing balance-Leahy Scale: Good Standing balance comment: steady taking steps with RW use                             Pertinent Vitals/Pain Pain Assessment Pain Assessment: No/denies pain HR 71-98 bpm during sessions  activities.    Home Living Family/patient expects to be discharged to:: Private residence Living Arrangements: Alone Available Help at Discharge: Family;Available PRN/intermittently Type of Home: House (Condo) Home Access: Level entry       Home Layout: One level Home Equipment: Cane - single point;Standard Environmental consultant;Shower seat - built in;Grab bars -  tub/shower;Hand held Stage manager (4 wheels)      Prior Function Prior Level of Function : Independent/Modified Independent             Mobility Comments: Pt will hold onto furniture in home as needed or use rollator within home; uses [facility] in community.  No recent falls reported.  No home O2 use.       Extremity/Trunk Assessment   Upper Extremity Assessment Upper Extremity Assessment: Overall WFL for tasks assessed    Lower Extremity Assessment Lower Extremity Assessment: Generalized weakness    Cervical / Trunk Assessment Cervical / Trunk Assessment: Normal  Communication   Communication Communication: No apparent difficulties    Cognition Arousal: Alert Behavior During Therapy: WFL for tasks assessed/performed   PT - Cognitive impairments: No apparent impairments                         Following commands: Intact       Cueing Cueing Techniques: Verbal cues     General Comments  Pt agreeable to therapy session.    Exercises     Assessment/Plan    PT Assessment Patient needs continued PT services  PT Problem List Decreased strength;Decreased activity tolerance;Decreased balance;Decreased mobility;Cardiopulmonary status limiting activity       PT Treatment Interventions DME instruction;Gait training;Functional mobility training;Therapeutic activities;Therapeutic exercise;Balance training;Patient/family education    PT Goals (Current goals can be found in the Care Plan section)  Acute Rehab PT Goals Patient Stated Goal: to return home PT Goal Formulation: With patient Time For Goal Achievement: 06/20/23 Potential to Achieve Goals: Good    Frequency Min 1X/week     Co-evaluation               AM-PAC PT "6 Clicks" Mobility  Outcome Measure Help needed turning from your back to your side while in a flat bed without using bedrails?: None Help needed moving from lying on your back to sitting on the side of a flat bed without  using bedrails?: A Little Help needed moving to and from a bed to a chair (including a wheelchair)?: A Little Help needed standing up from a chair using your arms (e.g., wheelchair or bedside chair)?: A Little Help needed to walk in hospital room?: A Little Help needed climbing 3-5 steps with a railing? : A Little 6 Click Score: 19    End of Session Equipment Utilized During Treatment: Oxygen (6 L via nasal cannula) Activity Tolerance: Patient tolerated treatment well Patient left: with call bell/phone within reach;with bed alarm set;with nursing/sitter in room (sitting on EOB with nursing present giving pt medications (nurse reported she would assist pt back to bed when done)) Nurse Communication: Mobility status;Precautions;Other (comment) (Pt's SpO2 sats and breathing with activity.) PT Visit Diagnosis: Other abnormalities of gait and mobility (R26.89);Muscle weakness (generalized) (M62.81)    Time: 1610-9604 PT Time Calculation (min) (ACUTE ONLY): 20 min   Charges:   PT Evaluation $PT Eval Low Complexity: 1 Low   PT General Charges $$ ACUTE PT VISIT: 1 Visit       Hendricks Limes, PT 06/06/23, 11:02 AM

## 2023-06-06 NOTE — Progress Notes (Signed)
PROGRESS NOTE    Samantha Freeman  OZH:[id number] DOB: August 06, 1935 DOA: 06/05/2023 PCP: Marguarite Arbour, MD  [id number]/[id number]  LOS: 1 day   Brief hospital course:   Assessment & Plan: Samantha Freeman is a 88 y.o. female with medical history significant of A-fib and PE on Eliquis, HTN, HLD, DM, dCHF, CKD-3A, IBS, depression with anxiety, who presents with SOB.   Patient states that she has supplemental oxygen at home, but not using it at home currently. She was found to have oxygen desaturation to 84% on room air, which improved to lower 90s on 4 L oxygen in the ED.    Acute hypoxemic respiratory failure --4-5L O2 requirement on presentation.  Per d/c summary on 05/18/23, pt was off oxygen prior to discharge.  Respiratory failure due to [facility] similar to last hospitalization. --cont diuresis --Continue supplemental O2 to keep sats >=92%, wean as tolerated  Acute on chronic diastolic CHF (congestive heart failure) [facility]):  Patient has SOB, 1+ leg edema, elevated BNP 593, crackles on auscultation, positive JVD, clinically consistent with CHF exacerbation.  2D echo on 01/30/2023 showed EF 60 to 65%.   --pt had a similar hospitalization in Jan 2025 and was treated with IV lasix 40 and discharged on oral lasix 20 mg daily and spironolactone 25 mg daily. --started on IV lasix 40 on admission --cont IV lasix 40 mg BID  HTN (hypertension) --cont cardizem, lopressor --cont IV lasix and aldactone   HLD (hyperlipidemia):  Patient's not taking Crestor -Fenofibrate   Type II diabetes mellitus with renal manifestations (HCC):  --cont glargine 20u nightly --ACHS and SSI   Chronic atrial fibrillation (HCC):  HR controlled --cont Cardizem, Lopressor and digoxin --cont Eliquis   History of pulmonary embolus (PE) --cont Eliquis   Chronic kidney disease, stage 3a (HCC): Renal function stable.  Creatinine 0.96, BUN 17, GFR 58   Diarrhea: 2/2 IBS.   No abdominal pain, no fever or leukocytosis.  Low  suspicions for C. Difficile -As needed Imodium   Anxiety and depression --cont Paxil and alprazolam   Overweight (BMI 25.0-29.9): Body weight 78.9 kg, BMI 29.87 -Encourage losing weight -Exercise and healthy diet   DVT prophylaxis: GE:XBMWUXL Code Status: DNR  Family Communication:  Level of care: Progressive Dispo:   The patient is from: home Anticipated d/c is to: home Anticipated d/c date is: 2-3 days   Subjective and Interval History:  Diarrhea improved today.  Reported more urine output with IV lasix.   Objective: Vitals:   06/06/23 1048 06/06/23 1050 06/06/23 1200 06/06/23 1451  BP: 127/71  135/63 135/63  Pulse: 81  (!) 59 62  Resp: 19  10   Temp:  98 F (36.7 C)  98 F (36.7 C)  TempSrc:  Oral  Oral  SpO2: 97%   98%  Weight:      Height:        Intake/Output Summary (Last 24 hours) at 06/06/2023 1648 Last data filed at 06/05/2023 1924 Gross per 24 hour  Intake 0 ml  Output --  Net 0 ml   Filed Weights   06/05/23 1520  Weight: 78.9 kg    Examination:   Constitutional: NAD, AAOx3 HEENT: conjunctivae and lids normal, EOMI CV: No cyanosis.   RESP: normal respiratory effort, on 6L Extremities: No effusions, edema in BLE SKIN: warm, dry Neuro: II - XII grossly intact.   Psych: Normal mood and affect.  Appropriate judgement and reason   Data Reviewed: I have personally reviewed labs and imaging studies  Time spent: 50 minutes  Darlin Priestly, MD [facility] If 7PM-7AM, please contact night-coverage 06/06/2023, 4:48 PM

## 2023-06-06 NOTE — ED Notes (Signed)
Pt had a total of (5) urine occurrences of urine output today in response to lasix.

## 2023-06-06 NOTE — Progress Notes (Signed)
   06/06/23 1235  Readmission Prevention Plan - High Risk  Transportation Screening Complete  PCP or Specialist appointment within 5-7 days of discharge Complete Clinical research associate will schedule at time of d/c)  Home Care Consult (High Risk) Complete  High Risk Social Work Consult for recovery care planning/counseling (includes patient and caregiver) Complete  High Risk Palliative Care Screening Not Applicable  Medication Review Complete   HRRA complete.

## 2023-06-06 NOTE — Progress Notes (Signed)
PHARMACIST - PHYSICIAN ORDER COMMUNICATION  CONCERNING: P&T Medication Policy on Herbal Medications  DESCRIPTION:  This patient's order for:  coenzyme Q10 capsules  has been noted.  This product(s) is classified as an "herbal" or natural product. Due to a lack of definitive safety studies or FDA approval, nonstandard manufacturing practices, plus the potential risk of unknown drug-drug interactions while on inpatient medications, the Pharmacy and Therapeutics Committee does not permit the use of "herbal" or natural products of this type within [facility].   ACTION TAKEN: The pharmacy department is unable to verify this order at this time and your patient has been informed of this safety policy. Please reevaluate patient's clinical condition at discharge and address if the herbal or natural product(s) should be resumed at that time.  Bettey Costa, PharmD Clinical Pharmacist 06/06/2023 12:21 AM

## 2023-06-06 NOTE — ED Notes (Signed)
Pt notified we need a stool sample for testing. She said she will provide one when she has a BM.

## 2023-06-06 NOTE — ED Notes (Signed)
Pt decided against Xanax and Attending messaged about requip for restless leg.

## 2023-06-06 NOTE — Progress Notes (Signed)
*  PRELIMINARY RESULTS* Echocardiogram 2D Echocardiogram has been performed.  Laddie Aquas 06/06/2023, 10:13 AM

## 2023-06-07 DIAGNOSIS — I5033 Acute on chronic diastolic (congestive) heart failure: Secondary | ICD-10-CM | POA: Diagnosis not present

## 2023-06-07 LAB — BASIC METABOLIC PANEL
Anion gap: 11 (ref 5–15)
BUN: 22 mg/dL (ref 8–23)
CO2: 33 mmol/L — ABNORMAL HIGH (ref 22–32)
Calcium: 9 mg/dL (ref 8.9–10.3)
Chloride: 93 mmol/L — ABNORMAL LOW (ref 98–111)
Creatinine, Ser: 0.96 mg/dL (ref 0.44–1.00)
GFR, Estimated: 58 mL/min — ABNORMAL LOW (ref >60)
Glucose, Bld: 199 mg/dL — ABNORMAL HIGH (ref 70–99)
Potassium: 3.5 mmol/L (ref 3.5–5.1)
Sodium: 137 mmol/L (ref 135–145)

## 2023-06-07 LAB — CBC
HCT: 42.6 % (ref 36.0–46.0)
Hemoglobin: 13.6 g/dL (ref 12.0–15.0)
MCH: 28.2 pg (ref 26.0–34.0)
MCHC: 31.9 g/dL (ref 30.0–36.0)
MCV: 88.4 fL (ref 80.0–100.0)
Platelets: 252 10*3/uL (ref 150–400)
RBC: 4.82 MIL/uL (ref 3.87–5.11)
RDW: 14 % (ref 11.5–15.5)
WBC: 7.4 10*3/uL (ref 4.0–10.5)
nRBC: 0 % (ref 0.0–0.2)

## 2023-06-07 LAB — CBG MONITORING, ED
Glucose-Capillary: 177 mg/dL — ABNORMAL HIGH (ref 70–99)
Glucose-Capillary: 298 mg/dL — ABNORMAL HIGH (ref 70–99)
Glucose-Capillary: 209 mg/dL — ABNORMAL HIGH (ref 70–99)

## 2023-06-07 LAB — GLUCOSE, CAPILLARY: Glucose-Capillary: 311 mg/dL — ABNORMAL HIGH (ref 70–99)

## 2023-06-07 LAB — MAGNESIUM: Magnesium: 1.9 mg/dL (ref 1.7–2.4)

## 2023-06-07 MED ORDER — INSULIN GLARGINE-YFGN 100 UNIT/ML ~~LOC~~ SOLN
23.0000 [IU] | Freq: Every day | SUBCUTANEOUS | Status: DC
  Administered 2023-06-07: 23 [IU] via SUBCUTANEOUS
  Filled 2023-06-07 (×2): qty 0.23

## 2023-06-07 MED ORDER — FUROSEMIDE 10 MG/ML IJ SOLN
40.0000 mg | Freq: Two times a day (BID) | INTRAMUSCULAR | Status: DC
  Administered 2023-06-07: 40 mg via INTRAVENOUS
  Filled 2023-06-07: qty 4

## 2023-06-07 MED ORDER — INSULIN ASPART 100 UNIT/ML IJ SOLN
0.0000 [IU] | Freq: Three times a day (TID) | INTRAMUSCULAR | Status: DC
  Administered 2023-06-07 – 2023-06-08 (×3): 7 [IU] via SUBCUTANEOUS
  Administered 2023-06-08: 5 [IU] via SUBCUTANEOUS
  Filled 2023-06-07 (×4): qty 1

## 2023-06-07 NOTE — Progress Notes (Addendum)
The patient is admitted from ED to 2 A. A & O x 4. She denies any acute pain. The patient is oriented to staff,ascom and call bell. She voiced no concern. Will continue to monitor.

## 2023-06-07 NOTE — Progress Notes (Signed)
PROGRESS NOTE    Samantha Freeman  OZH:[id number] DOB: 08-25-1935 DOA: 06/05/2023 PCP: Marguarite Arbour, MD  [id number]/[id number]  LOS: 2 days   Brief hospital course:   Assessment & Plan: Samantha Freeman is a 88 y.o. female with medical history significant of A-fib and PE on Eliquis, HTN, HLD, DM, dCHF, CKD-3A, IBS, depression with anxiety, who presents with SOB.   Patient states that she has supplemental oxygen at home, but not using it at home currently. She was found to have oxygen desaturation to 84% on room air, which improved to lower 90s on 4 L oxygen in the ED.    Acute hypoxemic respiratory failure --4-5L O2 requirement on presentation.  Per d/c summary on 05/18/23, pt was off oxygen prior to discharge.  Respiratory failure due to [facility], similar to last hospitalization. --cont diuresis --Continue supplemental O2 to keep sats >=92%, wean as tolerated  Acute on chronic diastolic CHF (congestive heart failure) [facility]):  Patient has SOB, 1+ leg edema, elevated BNP 593, crackles on auscultation, positive JVD, clinically consistent with CHF exacerbation.  2D echo on 01/30/2023 showed EF 60 to 65%.   --pt had a similar hospitalization in Jan 2025 and was treated with IV lasix 40 and discharged on oral lasix 20 mg daily and spironolactone 25 mg daily. --started on IV lasix 40 on admission --cont IV lasix 40 BID  HTN (hypertension) --cont cardizem and Lopressor --cont IV lasix and aldactone   HLD (hyperlipidemia):  Patient's not taking Crestor -Fenofibrate   Type II diabetes mellitus with renal manifestations (HCC):  --A1c 8.4, poorly controlled --increase glargine to 23u nightly  --ACHS and SSI   Chronic atrial fibrillation (HCC):  HR controlled --cont cardizem and Lopressor and digoxin --cont Eliquis   History of pulmonary embolus (PE) --cont Eliquis   Chronic kidney disease, stage 3a (HCC): Renal function stable.  Creatinine 0.96, BUN 17, GFR 58   Diarrhea: 2/2 IBS.   No  abdominal pain, no fever or leukocytosis.  Low suspicions for C. Difficile -As needed Imodium   Anxiety and depression --cont Paxil and alprazolam   Overweight (BMI 25.0-29.9): Body weight 78.9 kg, BMI 29.87 -Encourage losing weight -Exercise and healthy diet   DVT prophylaxis: GE:XBMWUXL Code Status: DNR  Family Communication:  Level of care: Med-Surg Dispo:   The patient is from: home Anticipated d/c is to: home Anticipated d/c date is: 2-3 days   Subjective and Interval History:  Pt reported not making as much urine today.  Positive DOE.   Objective: Vitals:   06/07/23 0645 06/07/23 0959 06/07/23 1200 06/07/23 1600  BP: 131/66 113/76 (!) 137/95 134/65  Pulse: (!) 52 67 (!) 55 70  Resp: 17 (!) 21 17 19   Temp:  98.9 F (37.2 C)    TempSrc: Oral Oral    SpO2: 93% 97% 98%   Weight:      Height:       No intake or output data in the 24 hours ending 06/07/23 1845  Filed Weights   06/05/23 1520  Weight: 78.9 kg    Examination:   Constitutional: NAD, AAOx3 HEENT: conjunctivae and lids normal, EOMI CV: No cyanosis.   RESP: normal respiratory effort, mild crackles at posterior bases, on 5L Extremities: No effusions, edema in BLE SKIN: warm, dry Neuro: II - XII grossly intact.   Psych: Normal mood and affect.  Appropriate judgement and reason   Data Reviewed: I have personally reviewed labs and imaging studies  Time spent: 35 minutes  Inetta Fermo  Fran Lowes, MD [facility] If 7PM-7AM, please contact night-coverage 06/07/2023, 6:45 PM

## 2023-06-07 NOTE — Inpatient Diabetes Management (Signed)
Inpatient Diabetes Program Recommendations  AACE/ADA: New Consensus Statement on Inpatient Glycemic Control (2015)  Target Ranges:  Prepandial:   less than 140 mg/dL      Peak postprandial:   less than 180 mg/dL (1-2 hours)      Critically ill patients:  140 - 180 mg/dL    Latest Reference Range & Units 06/07/23 07:30 06/07/23 12:48  Glucose-Capillary 70 - 99 mg/dL 161 (H) 096 (H)  (H): Data is abnormally high     Home DM Meds: Lantus 40 units at HS     Humalog 7-19 units TID per SSI     Farxiga 10 mg daily   Current Orders: Semglee 20 units at bedtime     Novolog Sensitive Correction Scale/ SSI (0-9 units) TID AC + HS    MD- Please consider adding Novolog Meal Coverage:  Novolog 4 units TID with meals HOLD if pt NPO HOLD if pt eats <50% meals     --Will follow patient during hospitalization--  Ambrose Finland RN, MSN, CDCES Diabetes Coordinator Inpatient Glycemic Control Team Team Pager: [phone] (8a-5p)

## 2023-06-07 NOTE — Progress Notes (Signed)
CBG is 311 and the order for sliding scale for night shift expired at 6 pm. Notified Jon Billings NP and received new order for sliding.

## 2023-06-08 DIAGNOSIS — I5033 Acute on chronic diastolic (congestive) heart failure: Secondary | ICD-10-CM | POA: Diagnosis not present

## 2023-06-08 LAB — BASIC METABOLIC PANEL
Anion gap: 13 (ref 5–15)
BUN: 26 mg/dL — ABNORMAL HIGH (ref 8–23)
CO2: 32 mmol/L (ref 22–32)
Calcium: 9.2 mg/dL (ref 8.9–10.3)
Chloride: 90 mmol/L — ABNORMAL LOW (ref 98–111)
Creatinine, Ser: 1.04 mg/dL — ABNORMAL HIGH (ref 0.44–1.00)
GFR, Estimated: 52 mL/min — ABNORMAL LOW (ref >60)
Glucose, Bld: 217 mg/dL — ABNORMAL HIGH (ref 70–99)
Potassium: 3.7 mmol/L (ref 3.5–5.1)
Sodium: 135 mmol/L (ref 135–145)

## 2023-06-08 LAB — GLUCOSE, CAPILLARY
Glucose-Capillary: 259 mg/dL — ABNORMAL HIGH (ref 70–99)
Glucose-Capillary: 307 mg/dL — ABNORMAL HIGH (ref 70–99)
Glucose-Capillary: 313 mg/dL — ABNORMAL HIGH (ref 70–99)
Glucose-Capillary: 291 mg/dL — ABNORMAL HIGH (ref 70–99)

## 2023-06-08 LAB — CBC
HCT: 44 % (ref 36.0–46.0)
Hemoglobin: 13.9 g/dL (ref 12.0–15.0)
MCH: 28 pg (ref 26.0–34.0)
MCHC: 31.6 g/dL (ref 30.0–36.0)
MCV: 88.7 fL (ref 80.0–100.0)
Platelets: 236 10*3/uL (ref 150–400)
RBC: 4.96 MIL/uL (ref 3.87–5.11)
RDW: 14.1 % (ref 11.5–15.5)
WBC: 9.6 10*3/uL (ref 4.0–10.5)
nRBC: 0 % (ref 0.0–0.2)

## 2023-06-08 LAB — MAGNESIUM: Magnesium: 1.8 mg/dL (ref 1.7–2.4)

## 2023-06-08 MED ORDER — FUROSEMIDE 10 MG/ML IJ SOLN
60.0000 mg | Freq: Two times a day (BID) | INTRAMUSCULAR | Status: DC
  Administered 2023-06-08 – 2023-06-09 (×3): 60 mg via INTRAVENOUS
  Filled 2023-06-08 (×3): qty 6

## 2023-06-08 MED ORDER — INSULIN ASPART 100 UNIT/ML IJ SOLN
0.0000 [IU] | Freq: Three times a day (TID) | INTRAMUSCULAR | Status: DC
  Administered 2023-06-09: 11 [IU] via SUBCUTANEOUS
  Administered 2023-06-09: 3 [IU] via SUBCUTANEOUS
  Administered 2023-06-09: 15 [IU] via SUBCUTANEOUS
  Administered 2023-06-10: 4 [IU] via SUBCUTANEOUS
  Administered 2023-06-10: 15 [IU] via SUBCUTANEOUS
  Administered 2023-06-10: 8 [IU] via SUBCUTANEOUS
  Administered 2023-06-11 (×2): 11 [IU] via SUBCUTANEOUS
  Administered 2023-06-11 – 2023-06-12 (×2): 15 [IU] via SUBCUTANEOUS
  Filled 2023-06-08 (×10): qty 1

## 2023-06-08 MED ORDER — INSULIN GLARGINE-YFGN 100 UNIT/ML ~~LOC~~ SOLN
25.0000 [IU] | Freq: Every day | SUBCUTANEOUS | Status: DC
  Administered 2023-06-08: 25 [IU] via SUBCUTANEOUS
  Filled 2023-06-08 (×2): qty 0.25

## 2023-06-08 MED ORDER — INSULIN ASPART 100 UNIT/ML IJ SOLN
0.0000 [IU] | Freq: Every day | INTRAMUSCULAR | Status: DC
  Administered 2023-06-08: 3 [IU] via SUBCUTANEOUS
  Administered 2023-06-09: 5 [IU] via SUBCUTANEOUS
  Administered 2023-06-10 – 2023-06-11 (×2): 4 [IU] via SUBCUTANEOUS
  Filled 2023-06-08 (×4): qty 1

## 2023-06-08 MED ORDER — DIPHENHYDRAMINE-ZINC ACETATE 2-0.1 % EX CREA
TOPICAL_CREAM | Freq: Three times a day (TID) | CUTANEOUS | Status: DC | PRN
  Filled 2023-06-08: qty 28

## 2023-06-08 MED ORDER — ORAL CARE MOUTH RINSE: 15.0000 mL | OROMUCOSAL | Status: DC | PRN

## 2023-06-08 NOTE — Progress Notes (Signed)
 Physical Therapy Treatment Patient Details Name: Samantha Freeman MRN: [id number] DOB: 1936-04-05 Today's Date: 06/08/2023   History of Present Illness Pt is an 88 y.o. female presenting to hospital 06/05/23 with c/o SOB, weakness, and diarrhea. Recent hospitalization with acute hypoxic respiratory failure d/t CHF exacerbation.  Pt admitted with acute on chronic diastolic CHF, htn, HLD, DM, chronic a-fib, diarrhea.  PMH includes a-fib, PE, htn, HLD, DM, dCHF, CKD-3A, IBS, depression with anxiety, biceps tenodesis 05/13/2018, R TKR, L reverse shoulder arthroplasty 2022; R shoulder arthroscopy with RCR 2020.    PT Comments  Pt in bed, happy to get up.  She get to EOB with ease.  Stands and opts not to use RW for gait.  Stated she has walkers at home but does not use them at baseline "I am too independent"  she is able to walk 50' x 2 in room then and additional 25' with no AD.  Initially pt requires cga x 1 and while balance is impaired she needs not physical assist.  As she fatigues balance impairments are increased with light assist to correct slight imbalances.  She stated she does have some increasing knee pain which she attributes to arthritis.  Offered and encouraged RW but she still declined despite seeing differences in gait quality.  She is on 5 lpm at rest and for session with sats 91% with gait.    Discussed increasing use of walker at home for safety especially as she fatigues or knee pain is a barrier.  She remains resistant but she is encouraged to consider using it when needed.  She voices understanding but does not seem like she will follow through with recommendations.  She stated she does not use O2 at baseline but stated "I will probably need it this time."   If plan is discharge home, recommend the following: A little help with walking and/or transfers;A little help with bathing/dressing/bathroom;Assistance with cooking/housework;Assist for transportation;Help with stairs or ramp for  entrance   Can travel by private vehicle        Equipment Recommendations       Recommendations for Other Services       Precautions / Restrictions Precautions Precautions: Fall Restrictions Weight Bearing Restrictions Per Provider Order: No     Mobility  Bed Mobility Overal bed mobility: Modified Independent               Patient Response: Cooperative  Transfers Overall transfer level: Modified independent Equipment used: Rolling walker (2 wheels), None Transfers: Sit to/from Stand Sit to Stand: Supervision                Ambulation/Gait Ambulation/Gait assistance: Contact guard assist, Min assist Gait Distance (Feet): 50 Feet Assistive device: None   Gait velocity: decreased     General Gait Details: 50' x 1 25' x 1 in room.  pt elects no AD   Stairs             Wheelchair Mobility     Tilt Bed Tilt Bed Patient Response: Cooperative  Modified Rankin (Stroke Patients Only)       Balance Overall balance assessment: Needs assistance Sitting-balance support: No upper extremity supported, Feet supported Sitting balance-Leahy Scale: Good     Standing balance support: During functional activity Standing balance-Leahy Scale: Fair Standing balance comment: unsteady with no AD as she fatigues but resistant to using one  Communication    Cognition Arousal: Alert Behavior During Therapy: WFL for tasks assessed/performed   PT - Cognitive impairments: No apparent impairments                                Cueing    Exercises Other Exercises Other Exercises: seated AROM    General Comments        Pertinent Vitals/Pain Pain Assessment Pain Assessment: Faces Faces Pain Scale: Hurts a little bit Pain Location: pt reports arthritic pain L knee today Pain Descriptors / Indicators: Sore, Aching Pain Intervention(s): Limited activity within patient's tolerance, Monitored during  session, Repositioned    Home Living                          Prior Function            PT Goals (current goals can now be found in the care plan section) Progress towards PT goals: Progressing toward goals    Frequency    Min 1X/week      PT Plan      Co-evaluation              AM-PAC PT "6 Clicks" Mobility   Outcome Measure  Help needed turning from your back to your side while in a flat bed without using bedrails?: None Help needed moving from lying on your back to sitting on the side of a flat bed without using bedrails?: None Help needed moving to and from a bed to a chair (including a wheelchair)?: None Help needed standing up from a chair using your arms (e.g., wheelchair or bedside chair)?: None Help needed to walk in hospital room?: A Little Help needed climbing 3-5 steps with a railing? : A Little 6 Click Score: 22    End of Session Equipment Utilized During Treatment: Oxygen (6 L via nasal cannula) Activity Tolerance: Patient tolerated treatment well Patient left: with call bell/phone within reach;with bed alarm set;with nursing/sitter in room (sitting on EOB with nursing present giving pt medications (nurse reported she would assist pt back to bed when done)) Nurse Communication: Mobility status;Precautions;Other (comment) (Pt's SpO2 sats and breathing with activity.) PT Visit Diagnosis: Other abnormalities of gait and mobility (R26.89);Muscle weakness (generalized) (M62.81)     Time: 7425-9563 PT Time Calculation (min) (ACUTE ONLY): 20 min  Charges:    $Gait Training: 8-22 mins PT General Charges $$ ACUTE PT VISIT: 1 Visit                   Danielle Dess, PTA 06/08/23, 11:13 AM

## 2023-06-08 NOTE — Progress Notes (Signed)
 PROGRESS NOTE    Samantha Freeman  ZOX:[id number] DOB: 1935/09/11 DOA: 06/05/2023 PCP: Marguarite Arbour, MD  231A/231A-AA  LOS: 3 days   Brief hospital course:   Assessment & Plan: [name] is a 88 y.o. female with medical history significant of A-fib and PE on Eliquis, HTN, HLD, DM, dCHF, CKD-3A, IBS, depression with anxiety, who presents with SOB.   Patient states that she has supplemental oxygen at home, but not using it at home currently. She was found to have oxygen desaturation to 84% on room air, which improved to lower 90s on 4 L oxygen in the ED.    Acute hypoxemic respiratory failure --4-5L O2 requirement on presentation.  Per d/c summary on 05/18/23, pt was off oxygen prior to discharge.  Respiratory failure due to [facility], similar to last hospitalization. --cont diuresis --Continue supplemental O2 to keep sats >=92%, wean as tolerated  Acute on chronic diastolic CHF (congestive heart failure) [facility]):  Patient has SOB, 1+ leg edema, elevated BNP 593, crackles on auscultation, positive JVD, clinically consistent with CHF exacerbation.  2D echo on 01/30/2023 showed EF 60 to 65%.   --pt had a similar hospitalization in Jan 2025 and was treated with IV lasix 40 and discharged on oral lasix 20 mg daily and spironolactone 25 mg daily. --started on IV lasix 40 on admission Plan: --increase IV lasix to 60 BID  HTN (hypertension) --cont cardizem and Lopressor --cont IV lasix and aldactone   HLD (hyperlipidemia):  Patient's not taking Crestor -Fenofibrate   Type II diabetes mellitus with renal manifestations (HCC):  --A1c 8.4, poorly controlled --increase glargine to 25u nightly  --increase SSI to mod scale   Chronic atrial fibrillation (HCC):  HR controlled --cont cardizem and Lopressor and digoxin --cont Eliquis   History of pulmonary embolus (PE) --cont Eliquis   Chronic kidney disease, stage 3a (HCC): Renal function stable.  Creatinine 0.96, BUN 17, GFR 58    Diarrhea: 2/2 IBS.   No abdominal pain, no fever or leukocytosis.  Low suspicions for C. Difficile -As needed Imodium   Anxiety and depression --cont Paxil and alprazolam   Overweight (BMI 25.0-29.9): Body weight 78.9 kg, BMI 29.87 -Encourage losing weight -Exercise and healthy diet   DVT prophylaxis: WJ:[id number] Code Status: DNR  Family Communication:  Level of care: Med-Surg Dispo:   The patient is from: home Anticipated d/c is to: home Anticipated d/c date is: 2-3 days   Subjective and Interval History:  Pt reported not putting out as much urine as on first day.  Complained of a red itchy pump developing on her left forearm after presentation.   Objective: Vitals:   06/08/23 0455 06/08/23 0500 06/08/23 0907 06/08/23 1617  BP: 111/70  128/72 119/76  Pulse: 71  89 80  Resp: 20  17 18   Temp: 98.3 F (36.8 C)  98.7 F (37.1 C) 98.7 F (37.1 C)  TempSrc: Oral  Oral Oral  SpO2: 94%  91% 92%  Weight:  77.7 kg    Height:       No intake or output data in the 24 hours ending 06/08/23 1719  Filed Weights   06/05/23 1520 06/08/23 0500  Weight: 78.9 kg 77.7 kg    Examination:   Constitutional: NAD, AAOx3 HEENT: conjunctivae and lids normal, EOMI CV: No cyanosis.   RESP: normal respiratory effort, on 4L SKIN: warm, dry.  1 quarter size round raised red bump over left forearm. Neuro: II - XII grossly intact.   Psych: Normal mood  and affect.  Appropriate judgement and reason   Data Reviewed: I have personally reviewed labs and imaging studies  Time spent: 35 minutes  Darlin Priestly, MD [facility] If 7PM-7AM, please contact night-coverage 06/08/2023, 5:19 PM

## 2023-06-08 NOTE — Plan of Care (Signed)
  Problem: Coping: Goal: Ability to adjust to condition or change in health will improve Outcome: Progressing   Problem: Health Behavior/Discharge Planning: Goal: Ability to manage health-related needs will improve Outcome: Progressing   Problem: Skin Integrity: Goal: Risk for impaired skin integrity will decrease Outcome: Progressing   Problem: Education: Goal: Knowledge of General Education information will improve Description: Including pain rating scale, medication(s)/side effects and non-pharmacologic comfort measures Outcome: Progressing   Problem: Activity: Goal: Risk for activity intolerance will decrease Outcome: Progressing   Problem: Pain Managment: Goal: General experience of comfort will improve and/or be controlled Outcome: Progressing   Problem: Clinical Measurements: Goal: Ability to maintain clinical measurements within normal limits will improve Outcome: Not Progressing Goal: Respiratory complications will improve Outcome: Not Progressing   Problem: Coping: Goal: Level of anxiety will decrease Outcome: Not Progressing

## 2023-06-08 NOTE — Plan of Care (Signed)

## 2023-06-09 DIAGNOSIS — I5033 Acute on chronic diastolic (congestive) heart failure: Secondary | ICD-10-CM | POA: Diagnosis not present

## 2023-06-09 LAB — CBC
HCT: 44.1 % (ref 36.0–46.0)
Hemoglobin: 13.8 g/dL (ref 12.0–15.0)
MCH: 28.2 pg (ref 26.0–34.0)
MCHC: 31.3 g/dL (ref 30.0–36.0)
MCV: 90 fL (ref 80.0–100.0)
Platelets: 216 10*3/uL (ref 150–400)
RBC: 4.9 MIL/uL (ref 3.87–5.11)
RDW: 14 % (ref 11.5–15.5)
WBC: 8.3 10*3/uL (ref 4.0–10.5)
nRBC: 0 % (ref 0.0–0.2)

## 2023-06-09 LAB — BASIC METABOLIC PANEL
Anion gap: 15 (ref 5–15)
BUN: 26 mg/dL — ABNORMAL HIGH (ref 8–23)
CO2: 31 mmol/L (ref 22–32)
Calcium: 9.4 mg/dL (ref 8.9–10.3)
Chloride: 88 mmol/L — ABNORMAL LOW (ref 98–111)
Creatinine, Ser: 0.97 mg/dL (ref 0.44–1.00)
GFR, Estimated: 57 mL/min — ABNORMAL LOW (ref >60)
Glucose, Bld: 199 mg/dL — ABNORMAL HIGH (ref 70–99)
Potassium: 3.7 mmol/L (ref 3.5–5.1)
Sodium: 134 mmol/L — ABNORMAL LOW (ref 135–145)

## 2023-06-09 LAB — GLUCOSE, CAPILLARY
Glucose-Capillary: 229 mg/dL — ABNORMAL HIGH (ref 70–99)
Glucose-Capillary: 323 mg/dL — ABNORMAL HIGH (ref 70–99)
Glucose-Capillary: 386 mg/dL — ABNORMAL HIGH (ref 70–99)
Glucose-Capillary: 190 mg/dL — ABNORMAL HIGH (ref 70–99)
Glucose-Capillary: 382 mg/dL — ABNORMAL HIGH (ref 70–99)

## 2023-06-09 LAB — MAGNESIUM: Magnesium: 1.6 mg/dL — ABNORMAL LOW (ref 1.7–2.4)

## 2023-06-09 MED ORDER — MAGNESIUM SULFATE 4 GM/100ML IV SOLN
4.0000 g | Freq: Once | INTRAVENOUS | Status: AC
  Administered 2023-06-09: 4 g via INTRAVENOUS
  Filled 2023-06-09: qty 100

## 2023-06-09 MED ORDER — INSULIN GLARGINE-YFGN 100 UNIT/ML ~~LOC~~ SOLN
30.0000 [IU] | Freq: Every day | SUBCUTANEOUS | Status: DC
  Administered 2023-06-09: 30 [IU] via SUBCUTANEOUS
  Filled 2023-06-09: qty 0.3

## 2023-06-09 MED ORDER — DIPHENHYDRAMINE-ZINC ACETATE 2-0.1 % EX CREA
TOPICAL_CREAM | Freq: Three times a day (TID) | CUTANEOUS | Status: DC
  Filled 2023-06-09 (×2): qty 1

## 2023-06-09 MED ORDER — FUROSEMIDE 10 MG/ML IJ SOLN
80.0000 mg | Freq: Two times a day (BID) | INTRAMUSCULAR | Status: DC
  Administered 2023-06-09 – 2023-06-11 (×5): 80 mg via INTRAVENOUS
  Filled 2023-06-09 (×5): qty 8

## 2023-06-09 NOTE — Progress Notes (Signed)
 Physical Therapy Treatment Patient Details Name: Samantha Freeman MRN: [id number] DOB: [birth year] Today's Date: 06/09/2023   History of Present Illness Pt is an 88 y.o. female presenting to hospital 06/05/23 with c/o SOB, weakness, and diarrhea. Recent hospitalization with acute hypoxic respiratory failure d/t CHF exacerbation.  Pt admitted with acute on chronic diastolic CHF, htn, HLD, DM, chronic a-fib, diarrhea.  PMH includes a-fib, PE, htn, HLD, DM, dCHF, CKD-3A, IBS, depression with anxiety, biceps tenodesis 05/13/2018, R TKR, L reverse shoulder arthroplasty 2022; R shoulder arthroscopy with RCR 2020.    PT Comments  Pt sitting EOB upon arrival.  She is able to stand and walk 90' with no AD but generally unsteady holding onto rails and objects to steady.  After short rest she agrees to trial RW and walks again.  Overall gait is improved with RW but she does have one small LOB where she recovers with light assist.  She does state floor is too hard and it affects her gait with socks.  She only has healed booties here.  She does state she has a rollator at home and will use as needed.  She continues to state she will be fine at home with her mobility.  Concerns remain but she remained firm in wanting home with HHPT.  She has had frequent admissions over the past few months.  She is on 3 lpm today and sats 90% after gait.     If plan is discharge home, recommend the following: A little help with walking and/or transfers;A little help with bathing/dressing/bathroom;Assistance with cooking/housework;Assist for transportation;Help with stairs or ramp for entrance   Can travel by private vehicle        Equipment Recommendations       Recommendations for Other Services       Precautions / Restrictions Precautions Precautions: Fall Restrictions Weight Bearing Restrictions Per Provider Order: No     Mobility  Bed Mobility Overal bed mobility: Modified Independent               Patient  Response: Cooperative  Transfers Overall transfer level: Modified independent                      Ambulation/Gait Ambulation/Gait assistance: Contact guard assist, Min assist Gait Distance (Feet): 90 Feet Assistive device: Rolling walker (2 wheels), None Gait Pattern/deviations: Decreased stride length, Shuffle Gait velocity: decreased     General Gait Details: does agree to RW on second attempt   Stairs             Wheelchair Mobility     Tilt Bed Tilt Bed Patient Response: Cooperative  Modified Rankin (Stroke Patients Only)       Balance Overall balance assessment: Needs assistance Sitting-balance support: No upper extremity supported, Feet supported Sitting balance-Leahy Scale: Good     Standing balance support: No upper extremity supported Standing balance-Leahy Scale: Fair Standing balance comment: unsteady with no AD, does agree to try RW today.                            Communication    Cognition Arousal: Alert Behavior During Therapy: WFL for tasks assessed/performed   PT - Cognitive impairments: No apparent impairments                                Cueing    Exercises  General Comments        Pertinent Vitals/Pain Pain Assessment Pain Assessment: Faces Faces Pain Scale: Hurts a little bit Pain Location: pt reports arthritic pain L knee today Pain Descriptors / Indicators: Sore, Aching Pain Intervention(s): Limited activity within patient's tolerance, Monitored during session, Repositioned    Home Living                          Prior Function            PT Goals (current goals can now be found in the care plan section) Progress towards PT goals: Progressing toward goals    Frequency    Min 1X/week      PT Plan      Co-evaluation              AM-PAC PT "6 Clicks" Mobility   Outcome Measure  Help needed turning from your back to your side while in a flat bed  without using bedrails?: None Help needed moving from lying on your back to sitting on the side of a flat bed without using bedrails?: None Help needed moving to and from a bed to a chair (including a wheelchair)?: None Help needed standing up from a chair using your arms (e.g., wheelchair or bedside chair)?: None Help needed to walk in hospital room?: A Little Help needed climbing 3-5 steps with a railing? : A Little 6 Click Score: 22    End of Session Equipment Utilized During Treatment: Oxygen (6 L via nasal cannula) Activity Tolerance: Patient tolerated treatment well Patient left: with call bell/phone within reach;with bed alarm set;with nursing/sitter in room (sitting on EOB with nursing present giving pt medications (nurse reported she would assist pt back to bed when done)) Nurse Communication: Mobility status;Precautions;Other (comment) (Pt's SpO2 sats and breathing with activity.) PT Visit Diagnosis: Other abnormalities of gait and mobility (R26.89);Muscle weakness (generalized) (M62.81)     Time: 3474-2595 PT Time Calculation (min) (ACUTE ONLY): 17 min  Charges:    $Gait Training: 8-22 mins PT General Charges $$ ACUTE PT VISIT: 1 Visit                   Danielle Dess, PTA 06/09/23, 8:36 AM

## 2023-06-09 NOTE — Progress Notes (Signed)
 PROGRESS NOTE    Samantha Freeman  XBM:[id number] DOB: 09/19/1935 DOA: 06/05/2023 PCP: Samantha Arbour, MD  109A/109A-AA  LOS: 4 days   Brief hospital course:   Assessment & Plan: Samantha Freeman is a 88 y.o. female with medical history significant of A-fib and PE on Eliquis, HTN, HLD, DM, dCHF, CKD-3A, IBS, depression with anxiety, who presents with SOB.   Patient states that she has supplemental oxygen at home, but not using it at home currently. She was found to have oxygen desaturation to 84% on room air, which improved to lower 90s on 4 L oxygen in the ED.    Acute hypoxemic respiratory failure --4-5L O2 requirement on presentation.  Per d/c summary on 05/18/23, pt was off oxygen prior to discharge.  Respiratory failure due to [facility], similar to last hospitalization. --cont diuresis --Continue supplemental O2 to keep sats >=92%, wean as tolerated  Acute on chronic diastolic CHF (congestive heart failure) [facility]):  Patient has SOB, 1+ leg edema, elevated BNP 593, crackles on auscultation, positive JVD, clinically consistent with CHF exacerbation.  2D echo on 01/30/2023 showed EF 60 to 65%.   --pt had a similar hospitalization in Jan 2025 and was treated with IV lasix 40 and discharged on oral lasix 20 mg daily and spironolactone 25 mg daily. --started on IV lasix 40 on admission Plan: --increase IV lasix to 80 BID  HTN (hypertension) --cont cardizem and Lopressor --cont IV lasix and aldactone   HLD (hyperlipidemia):  Patient's not taking Crestor -Fenofibrate   Type II diabetes mellitus with renal manifestations (HCC):  --A1c 8.4, poorly controlled --increase glargine to 30u nightly --ACHS and SSI   Chronic atrial fibrillation (HCC):  HR controlled --cont cardizem and Lopressor and digoxin --cont Eliquis   History of pulmonary embolus (PE) --cont Eliquis   Chronic kidney disease, stage 3a (HCC): Renal function stable.  Creatinine 0.96, BUN 17, GFR 58   Diarrhea: 2/2  IBS.   No abdominal pain, no fever or leukocytosis.  Low suspicions for C. Difficile -As needed Imodium   Anxiety and depression --cont Paxil and alprazolam   Overweight (BMI 25.0-29.9): Body weight 78.9 kg, BMI 29.87 -Encourage losing weight -Exercise and healthy diet  Hypomag --monitor and supplement PRN   DVT prophylaxis: UU:VOZDGUY Code Status: DNR  Family Communication:  Level of care: Med-Surg Dispo:   The patient is from: home Anticipated d/c is to: home Anticipated d/c date is: 2-3 days   Subjective and Interval History:  Pt reported some what improved urine output.     Objective: Vitals:   06/09/23 0426 06/09/23 0500 06/09/23 0744 06/09/23 1516  BP: 119/67  116/68 128/69  Pulse: 66  72 81  Resp: 18  16 20   Temp:   98.2 F (36.8 C) 98.9 F (37.2 C)  TempSrc:      SpO2: 93%  90% 95%  Weight:  77 kg    Height:        Intake/Output Summary (Last 24 hours) at 06/09/2023 1839 Last data filed at 06/09/2023 1700 Gross per 24 hour  Intake 100 ml  Output 1800 ml  Net -1700 ml    Filed Weights   06/05/23 1520 06/08/23 0500 06/09/23 0500  Weight: 78.9 kg 77.7 kg 77 kg    Examination:   Constitutional: NAD, AAOx3 HEENT: conjunctivae and lids normal, EOMI CV: No cyanosis.   RESP: normal respiratory effort SKIN: warm, dry.  Red bump on left forearm increased in size Neuro: II - XII grossly intact.  Psych: Normal mood and affect.  Appropriate judgement and reason   Data Reviewed: I have personally reviewed labs and imaging studies  Time spent: 35 minutes  Darlin Priestly, MD [facility] If 7PM-7AM, please contact night-coverage 06/09/2023, 6:39 PM

## 2023-06-10 DIAGNOSIS — I5033 Acute on chronic diastolic (congestive) heart failure: Secondary | ICD-10-CM | POA: Diagnosis not present

## 2023-06-10 LAB — BASIC METABOLIC PANEL
Anion gap: 13 (ref 5–15)
BUN: 34 mg/dL — ABNORMAL HIGH (ref 8–23)
CO2: 32 mmol/L (ref 22–32)
Calcium: 9.3 mg/dL (ref 8.9–10.3)
Chloride: 88 mmol/L — ABNORMAL LOW (ref 98–111)
Creatinine, Ser: 1.03 mg/dL — ABNORMAL HIGH (ref 0.44–1.00)
GFR, Estimated: 53 mL/min — ABNORMAL LOW (ref >60)
Glucose, Bld: 272 mg/dL — ABNORMAL HIGH (ref 70–99)
Potassium: 4.3 mmol/L (ref 3.5–5.1)
Sodium: 133 mmol/L — ABNORMAL LOW (ref 135–145)

## 2023-06-10 LAB — CBC
HCT: 44.5 % (ref 36.0–46.0)
Hemoglobin: 14.2 g/dL (ref 12.0–15.0)
MCH: 28.2 pg (ref 26.0–34.0)
MCHC: 31.9 g/dL (ref 30.0–36.0)
MCV: 88.3 fL (ref 80.0–100.0)
Platelets: 214 10*3/uL (ref 150–400)
RBC: 5.04 MIL/uL (ref 3.87–5.11)
RDW: 14 % (ref 11.5–15.5)
WBC: 7.7 10*3/uL (ref 4.0–10.5)
nRBC: 0 % (ref 0.0–0.2)

## 2023-06-10 LAB — GLUCOSE, CAPILLARY
Glucose-Capillary: 284 mg/dL — ABNORMAL HIGH (ref 70–99)
Glucose-Capillary: 410 mg/dL — ABNORMAL HIGH (ref 70–99)
Glucose-Capillary: 396 mg/dL — ABNORMAL HIGH (ref 70–99)
Glucose-Capillary: 316 mg/dL — ABNORMAL HIGH (ref 70–99)

## 2023-06-10 LAB — MAGNESIUM: Magnesium: 2.2 mg/dL (ref 1.7–2.4)

## 2023-06-10 MED ORDER — INSULIN GLARGINE-YFGN 100 UNIT/ML ~~LOC~~ SOLN
35.0000 [IU] | Freq: Every day | SUBCUTANEOUS | Status: DC
  Administered 2023-06-10: 35 [IU] via SUBCUTANEOUS
  Filled 2023-06-10: qty 0.35

## 2023-06-10 MED ORDER — INSULIN ASPART 100 UNIT/ML IJ SOLN
5.0000 [IU] | Freq: Three times a day (TID) | INTRAMUSCULAR | Status: DC
  Administered 2023-06-10 (×2): 5 [IU] via SUBCUTANEOUS
  Filled 2023-06-10 (×2): qty 1

## 2023-06-10 NOTE — Plan of Care (Signed)
 Pt requested info on foods high in Mg since she had a deficit and it had to be replaced.  Provided.

## 2023-06-10 NOTE — Progress Notes (Signed)
 PROGRESS NOTE    Jaimya Feliciano  ZOX:[id number] DOB: 1935-11-05 DOA: 06/05/2023 PCP: Marguarite Arbour, MD  109A/109A-AA  LOS: 5 days   Brief hospital course:   Assessment & Plan: Chablis Losh is a 88 y.o. female with medical history significant of A-fib and PE on Eliquis, HTN, HLD, DM, dCHF, CKD-3A, IBS, depression with anxiety, who presents with SOB.   Patient states that she has supplemental oxygen at home, but not using it at home currently. She was found to have oxygen desaturation to 84% on room air, which improved to lower 90s on 4 L oxygen in the ED.    Acute hypoxemic respiratory failure --4-5L O2 requirement on presentation.  Per d/c summary on 05/18/23, pt was off oxygen prior to discharge.  Respiratory failure due to [facility], similar to last hospitalization. --currently on 3L O2 --cont diuresis --Continue supplemental O2 to keep sats >=92%, wean as tolerated  Acute on chronic diastolic CHF (congestive heart failure) [facility]):  Patient has SOB, 1+ leg edema, elevated BNP 593, crackles on auscultation, positive JVD, clinically consistent with CHF exacerbation.  2D echo on 01/30/2023 showed EF 60 to 65%.   --pt had a similar hospitalization in Jan 2025 and was treated with IV lasix 40 and discharged on oral lasix 20 mg daily and spironolactone 25 mg daily. --started on IV lasix 40 on admission, increased. Plan: --cont IV lasix 80 BID  HTN (hypertension) --cont cardizem and Lopressor --cont IV lasix and aldactone   HLD (hyperlipidemia):  Patient's not taking Crestor -Fenofibrate   Type II diabetes mellitus with renal manifestations (HCC):  --A1c 8.4, poorly controlled --increase glargine to 35u nightly --add mealtime 5u TID --ACHS and SSI   Chronic atrial fibrillation (HCC):  HR controlled --cont cardizem and Lopressor and digoxin --cont Eliquis   History of pulmonary embolus (PE) --cont Eliquis   Chronic kidney disease, stage 3a (HCC): Renal function stable.   Creatinine 0.96, BUN 17, GFR 58   Diarrhea: 2/2 IBS.   No abdominal pain, no fever or leukocytosis.  Low suspicions for C. Difficile --Imodium PRN   Anxiety and depression --cont Paxil and alprazolam   Overweight (BMI 25.0-29.9): Body weight 78.9 kg, BMI 29.87 -Encourage losing weight -Exercise and healthy diet  Hypomag --monitor and supplement PRN   DVT prophylaxis: WJ:XBJYNWG Code Status: DNR  Family Communication:  Level of care: Med-Surg Dispo:   The patient is from: home Anticipated d/c is to: home Anticipated d/c date is: 2-3 days   Subjective and Interval History:  Pt reported feeling tired.   Objective: Vitals:   06/10/23 0500 06/10/23 0552 06/10/23 0916 06/10/23 1706  BP:  121/63 122/65 136/75  Pulse:  64 69 65  Resp:  18 19 19   Temp:  98.6 F (37 C) 98.9 F (37.2 C) 99.8 F (37.7 C)  TempSrc:  Oral Oral Oral  SpO2:  97% 95% 96%  Weight: 77.9 kg     Height:        Intake/Output Summary (Last 24 hours) at 06/10/2023 1851 Last data filed at 06/10/2023 1058 Gross per 24 hour  Intake 464.29 ml  Output 600 ml  Net -135.71 ml    Filed Weights   06/08/23 0500 06/09/23 0500 06/10/23 0500  Weight: 77.7 kg 77 kg 77.9 kg    Examination:   Constitutional: NAD, AAOx3 HEENT: conjunctivae and lids normal, EOMI CV: No cyanosis.   RESP: normal respiratory effort, not connected to supplemental oxygen, sating 86% on room air Extremities: No effusions, edema  in BLE SKIN: warm, dry Neuro: II - XII grossly intact.   Psych: Normal mood and affect.  Appropriate judgement and reason   Data Reviewed: I have personally reviewed labs and imaging studies  Time spent: 35 minutes  Darlin Priestly, MD [facility] If 7PM-7AM, please contact night-coverage 06/10/2023, 6:51 PM

## 2023-06-10 NOTE — Care Management Important Message (Signed)
 Important Message  Patient Details  Name: Samantha Freeman MRN: [id number] Date of Birth: March 05, 1936   Important Message Given:  Yes - Medicare IM     Keyshaun Exley W, CMA 06/10/2023, 10:10 AM

## 2023-06-10 NOTE — Progress Notes (Signed)
 Physical Therapy Treatment Patient Details Name: Samantha Freeman MRN: [id number] DOB: 1935-08-23 Today's Date: 06/10/2023   History of Present Illness Pt is an 88 y.o. female presenting to hospital 06/05/23 with c/o SOB, weakness, and diarrhea. Recent hospitalization with acute hypoxic respiratory failure d/t CHF exacerbation.  Pt admitted with acute on chronic diastolic CHF, htn, HLD, DM, chronic a-fib, diarrhea.  PMH includes a-fib, PE, htn, HLD, DM, dCHF, CKD-3A, IBS, depression with anxiety, biceps tenodesis 05/13/2018, R TKR, L reverse shoulder arthroplasty 2022; R shoulder arthroscopy with RCR 2020.    PT Comments  Pt ready for session in chair.  Had just returned from bathroom and fatigued.  She is 92% at rest on 3 lpm.  Qualifying sats for O2 completed SaO2 on room air at rest = 87% SaO2 on room air while ambulating = 92% SaO2 on 3 liters of O2 while ambulating = 91%  She walk 20' in room with no AD for on room air test.  Continues to reach for wall/ bed in room for support due to imbalances.  Gait improved with RW and she is able to progress gait in hallway.  Remains resistant to AD use but does have one at home.  Stated her walking is always more impaired in AM and improves during the day.  Stated she does have O2 tank at home but it is large and not portable system.  It has old cannula on it that she does not know how to change and does not want to use it as she was using it with a sinus infection and does not want to put it dirty back on her.  She is encouraged to have family come to change it out but they are all sick and she does not want them in her home right now.  Will relay her concerns to TOC.   If plan is discharge home, recommend the following: A little help with walking and/or transfers;A little help with bathing/dressing/bathroom;Assistance with cooking/housework;Assist for transportation;Help with stairs or ramp for entrance   Can travel by private vehicle        Equipment  Recommendations       Recommendations for Other Services       Precautions / Restrictions Precautions Precautions: Fall Restrictions Weight Bearing Restrictions Per Provider Order: No     Mobility  Bed Mobility               General bed mobility comments: in recliner before/after Patient Response: Cooperative  Transfers Overall transfer level: Modified independent     Sit to Stand: Modified independent (Device/Increase time)                Ambulation/Gait Ambulation/Gait assistance: Contact guard assist, Min assist Gait Distance (Feet): 90 Feet Assistive device: Rolling walker (2 wheels), None Gait Pattern/deviations: Decreased stride length, Shuffle Gait velocity: decreased     General Gait Details: reaches for objects with no AD, improved with RW   Stairs             Wheelchair Mobility     Tilt Bed Tilt Bed Patient Response: Cooperative  Modified Rankin (Stroke Patients Only)       Balance Overall balance assessment: Needs assistance Sitting-balance support: No upper extremity supported, Feet supported Sitting balance-Leahy Scale: Good     Standing balance support: No upper extremity supported Standing balance-Leahy Scale: Fair  Communication    Cognition Arousal: Alert Behavior During Therapy: WFL for tasks assessed/performed   PT - Cognitive impairments: No apparent impairments                                Cueing    Exercises      General Comments        Pertinent Vitals/Pain Pain Assessment Pain Assessment: Faces Faces Pain Scale: Hurts a little bit Pain Location: pt reports arthritic pain L knee today Pain Intervention(s): Limited activity within patient's tolerance, Monitored during session, Repositioned    Home Living                          Prior Function            PT Goals (current goals can now be found in the care plan section)  Progress towards PT goals: Progressing toward goals    Frequency    Min 1X/week      PT Plan      Co-evaluation              AM-PAC PT "6 Clicks" Mobility   Outcome Measure  Help needed turning from your back to your side while in a flat bed without using bedrails?: None Help needed moving from lying on your back to sitting on the side of a flat bed without using bedrails?: None Help needed moving to and from a bed to a chair (including a wheelchair)?: None Help needed standing up from a chair using your arms (e.g., wheelchair or bedside chair)?: None Help needed to walk in hospital room?: A Little Help needed climbing 3-5 steps with a railing? : A Little 6 Click Score: 22    End of Session Equipment Utilized During Treatment: Oxygen (6 L via nasal cannula) Activity Tolerance: Patient tolerated treatment well Patient left: with call bell/phone within reach;in chair (sitting on EOB with nursing present giving pt medications (nurse reported she would assist pt back to bed when done)) Nurse Communication: Mobility status;Precautions;Other (comment) (Pt's SpO2 sats and breathing with activity.) PT Visit Diagnosis: Other abnormalities of gait and mobility (R26.89);Muscle weakness (generalized) (M62.81)     Time: 9562-1308 PT Time Calculation (min) (ACUTE ONLY): 25 min  Charges:    $Gait Training: 23-37 mins PT General Charges $$ ACUTE PT VISIT: 1 Visit                   Danielle Dess, PTA 06/10/23, 9:11 AM

## 2023-06-10 NOTE — Plan of Care (Signed)

## 2023-06-10 NOTE — Inpatient Diabetes Management (Signed)
 Inpatient Diabetes Program Recommendations  AACE/ADA: New Consensus Statement on Inpatient Glycemic Control   Target Ranges:  Prepandial:   less than 140 mg/dL      Peak postprandial:   less than 180 mg/dL (1-2 hours)      Critically ill patients:  140 - 180 mg/dL    Latest Reference Range & Units 06/09/23 08:30 06/09/23 12:29 06/09/23 16:45 06/09/23 20:49 06/10/23 07:31  Glucose-Capillary 70 - 99 mg/dL 562 (H) 130 (H) 865 (H) 382 (H) 284 (H)   Review of Glycemic Control  Diabetes history: DM2 Outpatient Diabetes medications: Lantus 40 units at bedtime, Humalog 7-19 units TID with meals, Farxiga 10 mg daily Current orders for Inpatient glycemic control: Semglee 30 units at bedtime, Novolog 0-15 units TID with meals, Novolog 0-5 units QHS  Inpatient Diabetes Program Recommendations:    Insulin: CBG 284 mg/dl this morning.  Please consider increasing Semglee to 35 units at bedtime and adding  Novolog 5 units TID with meals for meal coverage if patient eats at least 50% of meals.  Thanks, Orlando Penner, RN, MSN, CDCES Diabetes Coordinator Inpatient Diabetes Program [phone] (Team Pager from 8am to 5pm)

## 2023-06-11 ENCOUNTER — Inpatient Hospital Stay: Payer: Medicare Other

## 2023-06-11 DIAGNOSIS — I5033 Acute on chronic diastolic (congestive) heart failure: Secondary | ICD-10-CM | POA: Diagnosis not present

## 2023-06-11 LAB — BASIC METABOLIC PANEL
Anion gap: 14 (ref 5–15)
BUN: 42 mg/dL — ABNORMAL HIGH (ref 8–23)
CO2: 34 mmol/L — ABNORMAL HIGH (ref 22–32)
Calcium: 9.9 mg/dL (ref 8.9–10.3)
Chloride: 84 mmol/L — ABNORMAL LOW (ref 98–111)
Creatinine, Ser: 1.1 mg/dL — ABNORMAL HIGH (ref 0.44–1.00)
GFR, Estimated: 49 mL/min — ABNORMAL LOW (ref >60)
Glucose, Bld: 282 mg/dL — ABNORMAL HIGH (ref 70–99)
Potassium: 3.5 mmol/L (ref 3.5–5.1)
Sodium: 132 mmol/L — ABNORMAL LOW (ref 135–145)

## 2023-06-11 LAB — GLUCOSE, CAPILLARY
Glucose-Capillary: 320 mg/dL — ABNORMAL HIGH (ref 70–99)
Glucose-Capillary: 370 mg/dL — ABNORMAL HIGH (ref 70–99)
Glucose-Capillary: 348 mg/dL — ABNORMAL HIGH (ref 70–99)
Glucose-Capillary: 317 mg/dL — ABNORMAL HIGH (ref 70–99)

## 2023-06-11 MED ORDER — INSULIN ASPART 100 UNIT/ML IJ SOLN: 10.0000 [IU] | Freq: Three times a day (TID) | INTRAMUSCULAR | Status: DC

## 2023-06-11 MED ORDER — INSULIN ASPART 100 UNIT/ML IJ SOLN
8.0000 [IU] | Freq: Three times a day (TID) | INTRAMUSCULAR | Status: DC
  Administered 2023-06-11 (×3): 8 [IU] via SUBCUTANEOUS
  Filled 2023-06-11 (×3): qty 1

## 2023-06-11 MED ORDER — INSULIN GLARGINE-YFGN 100 UNIT/ML ~~LOC~~ SOLN
38.0000 [IU] | Freq: Every day | SUBCUTANEOUS | Status: DC
  Administered 2023-06-11: 38 [IU] via SUBCUTANEOUS
  Filled 2023-06-11: qty 0.38

## 2023-06-11 MED ORDER — PANTOPRAZOLE SODIUM 40 MG PO TBEC
40.0000 mg | DELAYED_RELEASE_TABLET | Freq: Every day | ORAL | Status: DC
  Administered 2023-06-11 – 2023-06-12 (×2): 40 mg via ORAL
  Filled 2023-06-11 (×2): qty 1

## 2023-06-11 NOTE — Progress Notes (Signed)
 Physical Therapy Treatment Patient Details Name: Samantha Freeman MRN: [id number] DOB: 28-Oct-1935 Today's Date: 06/11/2023   History of Present Illness Pt is an 88 y.o. female presenting to hospital 06/05/23 with c/o SOB, weakness, and diarrhea. Recent hospitalization with acute hypoxic respiratory failure d/t CHF exacerbation.  Pt admitted with acute on chronic diastolic CHF, htn, HLD, DM, chronic a-fib, diarrhea.  PMH includes a-fib, PE, htn, HLD, DM, dCHF, CKD-3A, IBS, depression with anxiety, biceps tenodesis 05/13/2018, R TKR, L reverse shoulder arthroplasty 2022; R shoulder arthroscopy with RCR 2020.    PT Comments  Pt in chair, ready for session.  Stands without assist and opts to use RW for gait in hallway with assist for O2 tank and min a x 1 for one LOB as she fatigued on return to room.  She walks 100' x 2 with short seated rest in chair in hallway.  Again discussed discharge plan.  She remains firm in not wanting rehab.  She stated she has been x 2 in the past and did not feel it was helpful and did not receive the level of therapy she deemed beneficial "I can sit in a chair just as well at home".  Concerns do remain about some balance and safety impairments and she would benefit from increased family support at home.  Pt continues to state she feels good about DC but also stated "I don't have a choice do I?"  Pt continues to choose home vs rehab options.   If plan is discharge home, recommend the following: A little help with walking and/or transfers;A little help with bathing/dressing/bathroom;Assistance with cooking/housework;Assist for transportation;Help with stairs or ramp for entrance   Can travel by private vehicle        Equipment Recommendations  Rolling walker (2 wheels)    Recommendations for Other Services       Precautions / Restrictions Precautions Precautions: Fall Restrictions Weight Bearing Restrictions Per Provider Order: No     Mobility  Bed Mobility                General bed mobility comments: in recliner before/after Patient Response: Cooperative  Transfers Overall transfer level: Modified independent Equipment used: None   Sit to Stand: Modified independent (Device/Increase time)                Ambulation/Gait Ambulation/Gait assistance: Contact guard assist, Min assist Gait Distance (Feet): 100 Feet Assistive device: Rolling walker (2 wheels) Gait Pattern/deviations: Decreased stride length, Shuffle Gait velocity: decreased     General Gait Details: opt to use RW - "I think I need it"   Stairs             Wheelchair Mobility     Tilt Bed Tilt Bed Patient Response: Cooperative  Modified Rankin (Stroke Patients Only)       Balance Overall balance assessment: Needs assistance Sitting-balance support: No upper extremity supported, Feet supported Sitting balance-Leahy Scale: Good     Standing balance support: Bilateral upper extremity supported Standing balance-Leahy Scale: Fair Standing balance comment: unsteady with no AD, does opt to try RW today.                            Communication Communication Communication: No apparent difficulties  Cognition Arousal: Alert Behavior During Therapy: WFL for tasks assessed/performed   PT - Cognitive impairments: No apparent impairments  Following commands: Intact      Cueing Cueing Techniques: Verbal cues  Exercises      General Comments        Pertinent Vitals/Pain Pain Assessment Pain Assessment: No/denies pain    Home Living                          Prior Function            PT Goals (current goals can now be found in the care plan section) Progress towards PT goals: Progressing toward goals    Frequency    Min 1X/week      PT Plan      Co-evaluation              AM-PAC PT "6 Clicks" Mobility   Outcome Measure  Help needed turning from your back to your  side while in a flat bed without using bedrails?: None Help needed moving from lying on your back to sitting on the side of a flat bed without using bedrails?: None Help needed moving to and from a bed to a chair (including a wheelchair)?: None Help needed standing up from a chair using your arms (e.g., wheelchair or bedside chair)?: None Help needed to walk in hospital room?: A Little Help needed climbing 3-5 steps with a railing? : A Little 6 Click Score: 22    End of Session Equipment Utilized During Treatment: Gait belt;Oxygen Activity Tolerance: Patient tolerated treatment well Patient left: with call bell/phone within reach;in chair Nurse Communication: Mobility status;Precautions;Other (comment) PT Visit Diagnosis: Other abnormalities of gait and mobility (R26.89);Muscle weakness (generalized) (M62.81)     Time: 5188-4166 PT Time Calculation (min) (ACUTE ONLY): 11 min  Charges:    $Gait Training: 8-22 mins PT General Charges $$ ACUTE PT VISIT: 1 Visit                   Danielle Dess, PTA 06/11/23, 2:35 PM

## 2023-06-11 NOTE — Progress Notes (Addendum)
 PROGRESS NOTE    Samantha Freeman  GEX:[id number] DOB: 1936-01-16 DOA: 06/05/2023 PCP: Marguarite Arbour, MD  109A/109A-AA  LOS: 6 days   Brief hospital course:   Assessment & Plan: Samantha Freeman is a 88 y.o. female with medical history significant of A-fib and PE on Eliquis, HTN, HLD, DM, dCHF, CKD-3A, IBS, depression with anxiety, who presented with SOB.   Patient states that she has supplemental oxygen at home, but not using it at home currently. She was found to have oxygen desaturation to 84% on room air, which improved to lower 90s on 4 L oxygen in the ED.    Acute hypoxemic respiratory failure --4-5L O2 requirement on presentation.  Per d/c summary on 05/18/23, pt was off oxygen prior to discharge.  Respiratory failure due to [facility], similar to last hospitalization. --currently on 3L O2 --cont diuresis --Continue supplemental O2 to keep sats >=92%, wean as tolerated  Acute on chronic diastolic CHF (congestive heart failure) [facility]):  Patient has SOB, 1+ leg edema, elevated BNP 593, crackles on auscultation, positive JVD, clinically consistent with CHF exacerbation.  2D echo on 01/30/2023 showed EF 60 to 65%.   --pt had a similar hospitalization in Jan 2025 and was treated with IV lasix 40 and discharged on oral lasix 20 mg daily and spironolactone 25 mg daily. --started on IV lasix 40 mg on admission, increased to 60 and then 80 due to suboptimal response. Plan: --cont IV lasix 80 BID --cont Aldactone 25 mg daily --will need increased home diuretic after discharge.  HTN (hypertension) --cont home cardizem and Lopressor --cont IV lasix and aldactone   HLD (hyperlipidemia):  Patient's not taking Crestor -Fenofibrate   Type II diabetes mellitus with renal manifestations (HCC):  With hyperglycemia --A1c 8.4, poorly controlled --increase glargine to 38u nightly --increase mealtime to 10u TID --ACHS and SSI   Chronic atrial fibrillation (HCC):  HR controlled --cont cardizem  and Lopressor and digoxin --cont Eliquis   History of pulmonary embolus (PE) --cont Eliquis   Chronic kidney disease, stage 3a (HCC):  Renal function stable.  Creatinine 0.96, BUN 17, GFR 58   Diarrhea 2/2 IBS.   No abdominal pain, no fever or leukocytosis.  Low suspicions for C. Difficile --Imodium PRN   Anxiety and depression --cont Paxil and alprazolam   Overweight (BMI 25.0-29.9): Body weight 78.9 kg, BMI 29.87 -Encourage losing weight -Exercise and healthy diet  Hypomag --monitor and supplement PRN  GERD --resume home PPI   DVT prophylaxis: WN:[id number] Code Status: DNR  Family Communication:  Level of care: Med-Surg Dispo:   The patient is from: home Anticipated d/c is to: home Anticipated d/c date is: 1-2 days   Subjective and Interval History:  Pt reported some abdominal discomfort that she attributed to not getting her home PPI.   Objective: Vitals:   06/11/23 0612 06/11/23 0834 06/11/23 1615 06/11/23 1949  BP: 120/78 (!) 140/82 119/82 (!) 153/90  Pulse: (!) 104 64 61 69  Resp:  18 18   Temp: 97.9 F (36.6 C) 98.3 F (36.8 C) 98 F (36.7 C)   TempSrc:   Oral   SpO2: 94% 98% 93% 96%  Weight:      Height:        Intake/Output Summary (Last 24 hours) at 06/11/2023 2102 Last data filed at 06/11/2023 1800 Gross per 24 hour  Intake 240 ml  Output 1050 ml  Net -810 ml    Filed Weights   06/08/23 0500 06/09/23 0500 06/10/23 0500  Weight:  77.7 kg 77 kg 77.9 kg    Examination:   Constitutional: NAD, AAOx3 HEENT: conjunctivae and lids normal, EOMI CV: No cyanosis.   RESP: normal respiratory effort, on 3L Neuro: II - XII grossly intact.   Psych: Normal mood and affect.  Appropriate judgement and reason   Data Reviewed: I have personally reviewed labs and imaging studies  Time spent: 35 minutes  Darlin Priestly, MD [facility] If 7PM-7AM, please contact night-coverage 06/11/2023, 9:02 PM

## 2023-06-11 NOTE — Inpatient Diabetes Management (Signed)
 Inpatient Diabetes Program Recommendations  AACE/ADA: New Consensus Statement on Inpatient Glycemic Control  Target Ranges:  Prepandial:   less than 140 mg/dL      Peak postprandial:   less than 180 mg/dL (1-2 hours)      Critically ill patients:  140 - 180 mg/dL    Latest Reference Range & Units 06/11/23 04:59  Glucose 70 - 99 mg/dL 191 (H)    Latest Reference Range & Units 06/10/23 07:31 06/10/23 11:39 06/10/23 17:05 06/10/23 20:58  Glucose-Capillary 70 - 99 mg/dL 478 (H) 295 (H) 621 (H) 316 (H)   Review of Glycemic Control  Diabetes history: DM2 Outpatient Diabetes medications: Lantus 40 units at bedtime, Humalog 7-19 units TID with meals, Farxiga 10 mg daily Current orders for Inpatient glycemic control: Semglee 35 units at bedtime, Novolog 0-15 units TID with meals, Novolog 0-5 units at bedtime, Novolog 8 units TID with meals   Inpatient Diabetes Program Recommendations:     Insulin: Lab glucose 282 mg/dl this morning.  Please consider increasing Semglee to 38 units at bedtime.  Thanks, Orlando Penner, RN, MSN, CDCES Diabetes Coordinator Inpatient Diabetes Program [phone] (Team Pager from 8am to 5pm)

## 2023-06-12 ENCOUNTER — Other Ambulatory Visit (HOSPITAL_COMMUNITY): Payer: Self-pay

## 2023-06-12 DIAGNOSIS — I5033 Acute on chronic diastolic (congestive) heart failure: Secondary | ICD-10-CM | POA: Diagnosis not present

## 2023-06-12 LAB — CBC
HCT: 43.4 % (ref 36.0–46.0)
Hemoglobin: 13.9 g/dL (ref 12.0–15.0)
MCH: 27.8 pg (ref 26.0–34.0)
MCHC: 32 g/dL (ref 30.0–36.0)
MCV: 86.8 fL (ref 80.0–100.0)
Platelets: 225 10*3/uL (ref 150–400)
RBC: 5 MIL/uL (ref 3.87–5.11)
RDW: 13.8 % (ref 11.5–15.5)
WBC: 7.9 10*3/uL (ref 4.0–10.5)
nRBC: 0 % (ref 0.0–0.2)

## 2023-06-12 LAB — BASIC METABOLIC PANEL
Anion gap: 15 (ref 5–15)
BUN: 46 mg/dL — ABNORMAL HIGH (ref 8–23)
CO2: 32 mmol/L (ref 22–32)
Calcium: 9.8 mg/dL (ref 8.9–10.3)
Chloride: 84 mmol/L — ABNORMAL LOW (ref 98–111)
Creatinine, Ser: 1.06 mg/dL — ABNORMAL HIGH (ref 0.44–1.00)
GFR, Estimated: 51 mL/min — ABNORMAL LOW (ref >60)
Glucose, Bld: 321 mg/dL — ABNORMAL HIGH (ref 70–99)
Potassium: 3.7 mmol/L (ref 3.5–5.1)
Sodium: 131 mmol/L — ABNORMAL LOW (ref 135–145)

## 2023-06-12 LAB — GLUCOSE, CAPILLARY: Glucose-Capillary: 409 mg/dL — ABNORMAL HIGH (ref 70–99)

## 2023-06-12 LAB — MAGNESIUM: Magnesium: 1.7 mg/dL (ref 1.7–2.4)

## 2023-06-12 MED ORDER — FUROSEMIDE 40 MG PO TABS: 80.0000 mg | ORAL_TABLET | Freq: Every day | ORAL | Status: DC

## 2023-06-12 MED ORDER — DIGOXIN 125 MCG PO TABS: 0.1250 mg | ORAL_TABLET | ORAL | 0 refills | Status: DC

## 2023-06-12 MED ORDER — FUROSEMIDE 40 MG PO TABS: 40.0000 mg | ORAL_TABLET | Freq: Every day | ORAL | 1 refills | Status: AC

## 2023-06-12 MED ORDER — INSULIN ASPART 100 UNIT/ML IJ SOLN
12.0000 [IU] | Freq: Three times a day (TID) | INTRAMUSCULAR | Status: DC
  Administered 2023-06-12: 12 [IU] via SUBCUTANEOUS
  Filled 2023-06-12: qty 1

## 2023-06-12 MED ORDER — APIXABAN 5 MG PO TABS: 5.0000 mg | ORAL_TABLET | Freq: Two times a day (BID) | ORAL | 1 refills | Status: AC

## 2023-06-12 MED ORDER — INSULIN GLARGINE-YFGN 100 UNIT/ML ~~LOC~~ SOLN: 40.0000 [IU] | Freq: Every day | SUBCUTANEOUS | Status: DC

## 2023-06-12 MED ORDER — FUROSEMIDE 40 MG PO TABS
40.0000 mg | ORAL_TABLET | Freq: Every day | ORAL | Status: DC
  Administered 2023-06-12: 40 mg via ORAL
  Filled 2023-06-12: qty 1

## 2023-06-12 MED ORDER — LANTUS SOLOSTAR 100 UNIT/ML ~~LOC~~ SOPN: 40.0000 [IU] | PEN_INJECTOR | Freq: Every day | SUBCUTANEOUS | Status: DC

## 2023-06-12 NOTE — TOC Transition Note (Signed)
 Transition of Care [facility]) - Discharge Note   Patient Details  Name: Samantha Freeman MRN: [id number] Date of Birth: Apr 03, 1936  Transition of [facility]) CM/SW Contact:  Allena Katz, LCSW Phone Number: 06/12/2023, 8:37 AM   Clinical Narrative:   Pt discharging home with resumption of care orders through [facility]. [facility] notified.    Final next level of care: Home w Home Health Services Barriers to Discharge: Barriers Resolved   Patient Goals and CMS Choice Patient states their goals for this hospitalization and ongoing recovery are:: return home with [facility] CMS Medicare.gov Compare Post Acute Care list provided to:: Patient Choice offered to / list presented to : Patient      Discharge Placement                       Discharge Plan and Services Additional resources added to the After Visit Summary for                            [facility] Arranged: PT [facility] Agency: [facility] Date [facility] Agency Contacted: 06/12/23   Representative spoke with at [facility] Agency: Adelina Mings  Social Drivers of Health (SDOH) Interventions SDOH Screenings   Food Insecurity: No Food Insecurity (06/06/2023)  Housing: Low Risk  (06/06/2023)  Transportation Needs: No Transportation Needs (06/06/2023)  Utilities: Not At Risk (06/06/2023)  Financial Resource Strain: Low Risk  (05/24/2023)   Received from [facility]  Social Connections: Moderately Integrated (06/06/2023)  Tobacco Use: Low Risk  (06/06/2023)     Readmission Risk Interventions    06/06/2023   12:35 PM 05/13/2023   10:56 AM 03/28/2023    4:20 PM  Readmission Risk Prevention Plan  Transportation Screening Complete Complete Complete  PCP or Specialist Appt within 3-5 Days Complete Complete Complete  HRI or Home Care Consult Complete Complete Complete  Social Work Consult for Recovery Care Planning/Counseling Complete Complete Complete  Palliative Care Screening Not Applicable Not Applicable Not Applicable  Medication  Review Oceanographer) Complete Complete Complete

## 2023-06-12 NOTE — Inpatient Diabetes Management (Signed)
 Inpatient Diabetes Program Recommendations  AACE/ADA: New Consensus Statement on Inpatient Glycemic Control  Target Ranges:  Prepandial:   less than 140 mg/dL      Peak postprandial:   less than 180 mg/dL (1-2 hours)      Critically ill patients:  140 - 180 mg/dL    Latest Reference Range & Units 06/12/23 05:35  Glucose 70 - 99 mg/dL 540 (H)    Latest Reference Range & Units 06/11/23 08:31 06/11/23 12:08 06/11/23 16:37 06/11/23 19:55  Glucose-Capillary 70 - 99 mg/dL 981 (H) 191 (H) 478 (H) 317 (H)   Review of Glycemic Control  Diabetes history: DM2 Outpatient Diabetes medications: Lantus 40 units at bedtime, Humalog 7-19 units TID with meals, Farxiga 10 mg daily Current orders for Inpatient glycemic control: Semglee 38 units at bedtime, Novolog 0-15 units TID with meals, Novolog 0-5 units at bedtime, Novolog 10 units TID with meals   Inpatient Diabetes Program Recommendations:     Insulin: Noted meal coverage increased from 8 to 10 units TID with meals this morning.  Lab glucose 321 mg/dl this morning.  Please consider increasing Semglee to 44 units daily.  Thanks, Orlando Penner, RN, MSN, CDCES Diabetes Coordinator Inpatient Diabetes Program [phone] (Team Pager from 8am to 5pm)

## 2023-06-12 NOTE — Discharge Summary (Addendum)
 Physician Discharge Summary   Patient: Samantha Freeman MRN: [id number] DOB: 1935-10-15  Admit date:     06/05/2023  Discharge date: 06/12/23  Discharge Physician: Enedina Finner   PCP: Marguarite Arbour, MD   Recommendations at discharge:    F/u PCP Dr Judithann Sheen in 1-2 weeks F/u Dr Cassie Freer in 1 week  Discharge Diagnoses: Principal Problem:   Acute on chronic diastolic CHF (congestive heart failure) (HCC) Active Problems:   HTN (hypertension)   HLD (hyperlipidemia)   Type II diabetes mellitus with renal manifestations (HCC)   Chronic atrial fibrillation (HCC)   History of pulmonary embolus (PE)   Chronic kidney disease, stage 3a (HCC)   Diarrhea   Anxiety and depression   Overweight (BMI 25.0-29.9)   Samantha Freeman is a 88 y.o. female with medical history significant of A-fib and PE on Eliquis, HTN, HLD, DM, dCHF, CKD-3A, IBS, depression with anxiety, who presented with SOB.    Patient states that she has supplemental oxygen at home, but not using it at home currently. She was found to have oxygen desaturation to 84% on room air, which improved to lower 90s on 4 L oxygen in the ED.    Acute hypoxemic respiratory failure --4-5L O2 requirement on presentation.  Per d/c summary on 05/18/23, pt was off oxygen prior to discharge.  Respiratory failure due to [facility], similar to last hospitalization. --currently on 2L O2 --cont diuresis --Continue supplemental O2 to keep sats >=92%, wean as tolerated --pt advised to use her oxygen at home.   Acute on chronic diastolic CHF (congestive heart failure) [facility]):  Patient has SOB, 1+ leg edema, elevated BNP 593, crackles on auscultation, positive JVD, clinically consistent with CHF exacerbation.  2D echo on 01/30/2023 showed EF 60 to 65%.   --pt had a similar hospitalization in Jan 2025 and was treated with IV lasix 40 and discharged on oral lasix 20 mg daily and spironolactone 25 mg daily. --started on IV lasix 40 mg on admission, increased to 60 and  then 80 due to suboptimal response. -- IV lasix 80 BID--pt Euvolemic. Change to lasix 40 mg po daily --cont Aldactone 25 mg daily --pt advised to f/u with dr Cassie Freer --defer cardiac meds adjustment as outpt per cardiology   HTN (hypertension) --cont home cardizem and Lopressor --cont po lasix and aldactone   HLD (hyperlipidemia):  Patient's not taking Crestor -Fenofibrate   Type II diabetes mellitus with renal manifestations (HCC):  With hyperglycemia --A1c 8.4, poorly controlled --increase glargine to 40 u nightly --increase mealtime to 12u TID --ACHS and SSI   Chronic atrial fibrillation (HCC):  HR controlled --cont cardizem .Lopressor  -- No longer on Digoxin --cont Eliquis   History of pulmonary embolus (PE) --cont Eliquis   Chronic kidney disease, stage 3a (HCC):  Renal function stable.  Creatinine 0.96, BUN 17, GFR 58    Anxiety and depression --cont Paxil and alprazolam   Overweight (BMI 25.0-29.9): Body weight 78.9 kg, BMI 29.87 -Exercise and healthy diet   Hypomag --monitor and supplement PRN   GERD --resume home PPI     DVT prophylaxis: NW:GNFAOZH Code Status: DNR  Family Communication: pt pt son and cousin aware Level of care: Med-Surg      Disposition: Home health Diet recommendation:  Cardiac and Carb modified diet DISCHARGE MEDICATION: Allergies as of 06/12/2023       Reactions   Buspirone    Other Reaction(s): Dizziness   Propofol Anaphylaxis   Zolpidem Other (See Comments)   Hallucinations  Cholestyramine Itching, Rash   Codeine Nausea Only   Hydrochlorothiazide Other (See Comments)   PATIENT DOES NOT REMEMBER   Loratadine Other (See Comments)   PATIENT DOES NOT REMEMBER   Niacin Rash        Medication List     STOP taking these medications    digoxin 0.125 MG tablet Commonly known as: LANOXIN   rosuvastatin 10 MG tablet Commonly known as: CRESTOR       TAKE these medications    ALPRAZolam 0.5 MG  tablet Commonly known as: XANAX Take 0.5 mg by mouth at bedtime as needed for anxiety or sleep.   apixaban 5 MG Tabs tablet Commonly known as: ELIQUIS Take 1 tablet (5 mg total) by mouth 2 (two) times daily. What changed: See the new instructions.   benzonatate 200 MG capsule Commonly known as: TESSALON Take by mouth.   blood glucose meter kit and supplies Kit Dispense based on patient and insurance preference. Use up to four times daily as directed. (FOR ICD-9 250.00, 250.01). For QAC - HS accuchecks.   CALCIUM 600/VITAMIN D3 PO Take 1 tablet by mouth daily.   Coenzyme Q10 10 MG capsule Take 10 mg by mouth every morning.   cyanocobalamin 1000 MCG tablet Commonly known as: VITAMIN B12 Take 1,000 mcg by mouth daily.   dapagliflozin propanediol 10 MG Tabs tablet Commonly known as: FARXIGA Take 10 mg by mouth daily.   diltiazem 180 MG 24 hr capsule Commonly known as: CARDIZEM CD Take 180 mg by mouth daily.   feeding supplement (GLUCERNA SHAKE) Liqd Take 237 mLs by mouth 3 (three) times daily between meals.   fenofibrate 48 MG tablet Commonly known as: TRICOR Take 48 mg by mouth daily.   FREESTYLE LITE test strip Generic drug: glucose blood For glucose testing every before meals at bedtime. Diagnosis E 11.65  Can substitute to any accepted brand   furosemide 40 MG tablet Commonly known as: LASIX Take 1 tablet (40 mg total) by mouth daily. What changed:  medication strength how much to take   gabapentin 100 MG capsule Commonly known as: NEURONTIN Take 200 mg by mouth 3 (three) times daily.   insulin lispro 100 UNIT/ML injection Commonly known as: HUMALOG Inject 7-19 Units into the skin 3 (three) times daily before meals. Blood Glucose level: 140-199 - 7 units, 200-250 - 9 units, 251-299 - 13 units,  300-349 - 17 units,  350 or above 19 units.   Insulin Syringe-Needle U-100 25G X 1" 1 ML Misc For 4 times a day insulin SQ, 1 month supply. Diagnosis E11.65    Lantus SoloStar 100 UNIT/ML Solostar Pen Generic drug: insulin glargine Inject 40 Units into the skin at bedtime.   magnesium oxide 400 (240 Mg) MG tablet Commonly known as: MAG-OX Take 2 tablets by mouth 3 (three) times daily.   metoprolol tartrate 50 MG tablet Commonly known as: LOPRESSOR Take 1 tablet (50 mg total) by mouth 2 (two) times daily.   multivitamin tablet Take 1 tablet by mouth daily. Women's Daily Multivitamin   omeprazole 20 MG capsule Commonly known as: PRILOSEC Take 20 mg by mouth daily.   PARoxetine 10 MG tablet Commonly known as: PAXIL Take 10 mg by mouth daily.   prednisoLONE acetate 1 % ophthalmic suspension Commonly known as: PRED FORTE Place 1 drop into both eyes at bedtime.   rOPINIRole 1 MG tablet Commonly known as: REQUIP Take 1 mg by mouth at bedtime.   sodium chloride 0.65 % Soln  nasal spray Commonly known as: OCEAN Place 2 sprays into both nostrils 4 (four) times daily.   spironolactone 25 MG tablet Commonly known as: ALDACTONE Take 1 tablet (25 mg total) by mouth daily.        Follow-up Information     Sparks, Duane Lope, MD Follow up.   Specialty: Internal Medicine Why: Hospital follow up Contact information: [address] [facility] [place] [address] [phone]         Marcina Millard, MD. Schedule an appointment as soon as possible for a visit in 1 week(s).   Specialty: Cardiology Why: f/u CHF Contact information: [address] [facility] [place] [address] [phone]                Discharge Exam: Filed Weights   06/08/23 0500 06/09/23 0500 06/10/23 0500  Weight: 77.7 kg 77 kg 77.9 kg   Alert and oriented times three respiratory by basilar few rales present. No respiratory distress cardiovascular both heart sounds normal neuro- grossly intact extremity no edema  Condition at discharge: fair  The results of significant diagnostics from  this hospitalization (including imaging, microbiology, ancillary and laboratory) are listed below for reference.   Imaging Studies: DG Chest 2 View Result Date: 06/11/2023 CLINICAL DATA:  Hypoxia.  Shortness of breath. EXAM: CHEST - 2 VIEW COMPARISON:  Radiograph 06/05/2023, CT 05/08/2023 FINDINGS: Stable cardiomegaly. Unchanged mediastinal contours. Improving bibasilar aeration, residual streaky atelectasis on the left. Diminishing right pleural effusion. No new or progressive airspace disease. No pneumothorax. No pulmonary edema. Reverse left shoulder arthroplasty. IMPRESSION: 1. Improving bibasilar aeration, residual streaky atelectasis on the left. Diminishing left pleural effusion. 2. No new or progressive airspace disease. 3. Stable cardiomegaly. Electronically Signed   By: Narda Rutherford M.D.   On: 06/11/2023 11:47   ECHOCARDIOGRAM COMPLETE Result Date: 06/06/2023    ECHOCARDIOGRAM REPORT   Patient Name:   ARAEYA LAMB Date of Exam: 06/06/2023 Medical Rec #:  [id number]       Height:       64.0 in Accession #:    [id number]      Weight:       174.0 lb Date of Birth:  13-Feb-1936        BSA:          1.844 m Patient Age:    88 years        BP:           160/80 mmHg Patient Gender: F               HR:           80 bpm. Exam Location:  [facility] Procedure: 2D Echo, Cardiac Doppler and Color Doppler (Both Spectral and Color            Flow Doppler were utilized during procedure). Indications:     CHF I50.31  History:         Patient has prior history of Echocardiogram examinations, most                  recent 01/31/2023. Arrythmias:Atrial Fibrillation; Risk                  Factors:Hypertension, Dyslipidemia and Diabetes. H/O pulmonary                  embolus.  Sonographer:     Dondra Prader RVT RCS Referring Phys:  [id number] Brien Few NIU Diagnosing Phys: Alwyn Pea MD IMPRESSIONS  1. Consider infiltrative w/u amyloid/sarcoid/hemochromitosis.  2. Left ventricular ejection fraction, by estimation, is 70 to 75%.  The left ventricle has hyperdynamic function. The left ventricle has no regional wall motion abnormalities. There is mild concentric left ventricular hypertrophy. Left ventricular diastolic parameters are consistent with Grade III diastolic dysfunction (restrictive).  3. Right ventricular systolic function is normal. The right ventricular size is mildly enlarged.  4. The mitral valve is grossly normal. Mild mitral valve regurgitation.  5. Tricuspid valve regurgitation is mild to moderate.  6. The aortic valve is grossly normal. Aortic valve regurgitation is mild. Aortic valve sclerosis is present, with no evidence of aortic valve stenosis. FINDINGS  Left Ventricle: Left ventricular ejection fraction, by estimation, is 70 to 75%. The left ventricle has hyperdynamic function. The left ventricle has no regional wall motion abnormalities. Strain imaging was not performed. The left ventricular internal cavity size was normal in size. There is mild concentric left ventricular hypertrophy. Left ventricular diastolic parameters are consistent with Grade III diastolic dysfunction (restrictive). Right Ventricle: The right ventricular size is mildly enlarged. No increase in right ventricular wall thickness. Right ventricular systolic function is normal. Left Atrium: Left atrial size was normal in size. Right Atrium: Right atrial size was not assessed. Pericardium: There is no evidence of pericardial effusion. Mitral Valve: The mitral valve is grossly normal. Mild mitral valve regurgitation. Tricuspid Valve: The tricuspid valve is grossly normal. Tricuspid valve regurgitation is mild to moderate. Aortic Valve: The aortic valve is grossly normal. Aortic valve regurgitation is mild. Aortic valve sclerosis is present, with no evidence of aortic valve stenosis. Aortic valve mean gradient measures 15.3 mmHg. Aortic valve peak gradient measures 27.5 mmHg. Aortic valve area, by VTI measures 1.12 cm. Pulmonic Valve: The pulmonic valve  was normal in structure. Pulmonic valve regurgitation is mild. Aorta: The ascending aorta was not well visualized. IAS/Shunts: No atrial level shunt detected by color flow Doppler. Additional Comments: Consider infiltrative w/u amyloid/sarcoid/hemochromitosis. 3D imaging was not performed.  LEFT VENTRICLE PLAX 2D LVIDd:         3.80 cm   Diastology LVIDs:         2.20 cm   LV e' medial:    8.38 cm/s LV PW:         1.40 cm   LV E/e' medial:  12.5 LV IVS:        1.30 cm   LV e' lateral:   11.40 cm/s LVOT diam:     2.00 cm   LV E/e' lateral: 9.2 LV SV:         52 LV SV Index:   28 LVOT Area:     3.14 cm  RIGHT VENTRICLE             IVC RV Basal diam:  4.30 cm     IVC diam: 1.30 cm RV S prime:     11.50 cm/s TAPSE (M-mode): 1.3 cm LEFT ATRIUM             Index        RIGHT ATRIUM           Index LA diam:        4.10 cm 2.22 cm/m   RA Area:     24.30 cm LA Vol (A2C):   95.6 ml 51.85 ml/m  RA Volume:   84.20 ml  45.66 ml/m LA Vol (A4C):   82.2 ml 44.58 ml/m LA Biplane Vol: 92.8 ml 50.33 ml/m  AORTIC VALVE  PULMONIC VALVE AV Area (Vmax):    1.06 cm      PV Vmax:       0.71 m/s AV Area (Vmean):   1.07 cm      PV Peak grad:  2.0 mmHg AV Area (VTI):     1.12 cm AV Vmax:           262.00 cm/s AV Vmean:          178.000 cm/s AV VTI:            0.463 m AV Peak Grad:      27.5 mmHg AV Mean Grad:      15.3 mmHg LVOT Vmax:         88.60 cm/s LVOT Vmean:        60.600 cm/s LVOT VTI:          0.165 m LVOT/AV VTI ratio: 0.36  AORTA Ao Root diam: 3.00 cm Ao Asc diam:  3.30 cm MITRAL VALVE                TRICUSPID VALVE MV Area (PHT): 3.85 cm     TR Peak grad:   41.5 mmHg MV Decel Time: 197 msec     TR Vmax:        322.00 cm/s MV E velocity: 105.00 cm/s MV A velocity: 32.30 cm/s   SHUNTS MV E/A ratio:  3.25         Systemic VTI:  0.16 m                             Systemic Diam: 2.00 cm Alwyn Pea MD Electronically signed by Alwyn Pea MD Signature Date/Time: 06/06/2023/2:38:49 PM    Final     DG Chest 2 View Result Date: 06/05/2023 CLINICAL DATA:  Shortness of breath EXAM: CHEST - 2 VIEW COMPARISON:  05/11/2023 FINDINGS: Cardiomegaly. Mediastinal contours are within normal limits. Mild vascular congestion and interstitial prominence, likely interstitial edema. Small right pleural effusion. Bibasilar atelectasis. IMPRESSION: Continued mild CHF. Small right pleural effusion.  Bibasilar atelectasis. Electronically Signed   By: Charlett Nose M.D.   On: 06/05/2023 17:36    Microbiology: Results for orders placed or performed during the hospital encounter of 06/05/23  Resp panel by RT-PCR (RSV, Flu A&B, Covid) Anterior Nasal Swab     Status: None   Collection Time: 06/05/23  3:14 PM   Specimen: Anterior Nasal Swab  Result Value Ref Range Status   SARS Coronavirus 2 by RT PCR NEGATIVE NEGATIVE Final    Comment: (NOTE) SARS-CoV-2 target nucleic acids are NOT DETECTED.  The SARS-CoV-2 RNA is generally detectable in upper respiratory specimens during the acute phase of infection. The lowest concentration of SARS-CoV-2 viral copies this assay can detect is 138 copies/mL. A negative result does not preclude SARS-Cov-2 infection and should not be used as the sole basis for treatment or other patient management decisions. A negative result may occur with  improper specimen collection/handling, submission of specimen other than nasopharyngeal swab, presence of viral mutation(s) within the areas targeted by this assay, and inadequate number of viral copies(<138 copies/mL). A negative result must be combined with clinical observations, patient history, and epidemiological information. The expected result is Negative.  Fact Sheet for Patients:  BloggerCourse.com  Fact Sheet for Healthcare Providers:  SeriousBroker.it  This test is no t yet approved or cleared by the [place] FDA and  has been authorized for detection  and/or  diagnosis of SARS-CoV-2 by FDA under an Emergency Use Authorization (EUA). This EUA will remain  in effect (meaning this test can be used) for the duration of the COVID-19 declaration under Section 564(b)(1) of the Act, 21 U.S.C.section 360bbb-3(b)(1), unless the authorization is terminated  or revoked sooner.       Influenza A by PCR NEGATIVE NEGATIVE Final   Influenza B by PCR NEGATIVE NEGATIVE Final    Comment: (NOTE) The Xpert Xpress SARS-CoV-2/FLU/RSV plus assay is intended as an aid in the diagnosis of influenza from Nasopharyngeal swab specimens and should not be used as a sole basis for treatment. Nasal washings and aspirates are unacceptable for Xpert Xpress SARS-CoV-2/FLU/RSV testing.  Fact Sheet for Patients: BloggerCourse.com  Fact Sheet for Healthcare Providers: SeriousBroker.it  This test is not yet approved or cleared by the [place] FDA and has been authorized for detection and/or diagnosis of SARS-CoV-2 by FDA under an Emergency Use Authorization (EUA). This EUA will remain in effect (meaning this test can be used) for the duration of the COVID-19 declaration under Section 564(b)(1) of the Act, 21 U.S.C. section 360bbb-3(b)(1), unless the authorization is terminated or revoked.     Resp Syncytial Virus by PCR NEGATIVE NEGATIVE Final    Comment: (NOTE) Fact Sheet for Patients: BloggerCourse.com  Fact Sheet for Healthcare Providers: SeriousBroker.it  This test is not yet approved or cleared by the [place] FDA and has been authorized for detection and/or diagnosis of SARS-CoV-2 by FDA under an Emergency Use Authorization (EUA). This EUA will remain in effect (meaning this test can be used) for the duration of the COVID-19 declaration under Section 564(b)(1) of the Act, 21 U.S.C. section 360bbb-3(b)(1), unless the authorization is terminated  or revoked.  Performed at [facility], [address] [place], [place] [address]     Labs: CBC: Recent Labs  Lab 06/05/23 1514 06/06/23 0538 06/07/23 4098 06/08/23 0819 06/09/23 0403 06/10/23 0702 06/12/23 0535  WBC 9.8   < > 7.4 9.6 8.3 7.7 7.9  NEUTROABS 5.6  --   --   --   --   --   --   HGB 13.9   < > 13.6 13.9 13.8 14.2 13.9  HCT 44.3   < > 42.6 44.0 44.1 44.5 43.4  MCV 90.0   < > 88.4 88.7 90.0 88.3 86.8  PLT 291   < > 252 236 216 214 225   < > = values in this interval not displayed.   Basic Metabolic Panel: Recent Labs  Lab 06/07/23 0628 06/08/23 0819 06/09/23 0403 06/10/23 0702 06/11/23 0459 06/12/23 0535  NA 137 135 134* 133* 132* 131*  K 3.5 3.7 3.7 4.3 3.5 3.7  CL 93* 90* 88* 88* 84* 84*  CO2 33* 32 31 32 34* 32  GLUCOSE 199* 217* 199* 272* 282* 321*  BUN 22 26* 26* 34* 42* 46*  CREATININE 0.96 1.04* 0.97 1.03* 1.10* 1.06*  CALCIUM 9.0 9.2 9.4 9.3 9.9 9.8  MG 1.9 1.8 1.6* 2.2  --  1.7   Liver Function Tests: Recent Labs  Lab 06/05/23 1514  AST 19  ALT 14  ALKPHOS 61  BILITOT 1.1  PROT 7.1  ALBUMIN 3.6   CBG: Recent Labs  Lab 06/11/23 0831 06/11/23 1208 06/11/23 1637 06/11/23 1955 06/12/23 0818  GLUCAP 320* 370* 348* 317* 409*    Discharge time spent: greater than 30 minutes.  Signed: Enedina Finner, MD [facility] 06/12/2023

## 2023-07-02 ENCOUNTER — Other Ambulatory Visit: Payer: Self-pay | Admitting: Physician Assistant

## 2023-07-02 DIAGNOSIS — R600 Localized edema: Secondary | ICD-10-CM

## 2023-07-02 DIAGNOSIS — I5043 Acute on chronic combined systolic (congestive) and diastolic (congestive) heart failure: Secondary | ICD-10-CM

## 2023-07-24 ENCOUNTER — Ambulatory Visit
Admission: RE | Admit: 2023-07-24 | Discharge: 2023-07-24 | Disposition: A | Discharge status: Home / Self Care | Source: Ambulatory Visit | Attending: Physician Assistant | Admitting: Physician Assistant

## 2023-07-24 ENCOUNTER — Other Ambulatory Visit: Payer: Self-pay | Admitting: Physician Assistant

## 2023-07-24 DIAGNOSIS — I5043 Acute on chronic combined systolic (congestive) and diastolic (congestive) heart failure: Secondary | ICD-10-CM | POA: Diagnosis present

## 2023-07-24 DIAGNOSIS — R600 Localized edema: Secondary | ICD-10-CM

## 2023-07-24 MED ORDER — GADOBUTROL 1 MMOL/ML IV SOLN
11.0000 mL | Freq: Once | INTRAVENOUS | Status: AC | PRN
  Administered 2023-07-24: 11 mL via INTRAVENOUS

## 2023-11-29 ENCOUNTER — Other Ambulatory Visit: Payer: Self-pay | Admitting: Pulmonary Disease

## 2023-11-29 DIAGNOSIS — R0609 Other forms of dyspnea: Secondary | ICD-10-CM

## 2023-11-29 DIAGNOSIS — R0989 Other specified symptoms and signs involving the circulatory and respiratory systems: Secondary | ICD-10-CM

## 2023-12-04 ENCOUNTER — Ambulatory Visit
Admission: RE | Admit: 2023-12-04 | Discharge: 2023-12-04 | Disposition: A | Discharge status: Home / Self Care | Source: Ambulatory Visit | Attending: Pulmonary Disease | Admitting: Pulmonary Disease

## 2023-12-04 DIAGNOSIS — R0609 Other forms of dyspnea: Secondary | ICD-10-CM | POA: Diagnosis present

## 2023-12-04 DIAGNOSIS — R0989 Other specified symptoms and signs involving the circulatory and respiratory systems: Secondary | ICD-10-CM | POA: Insufficient documentation

## 2023-12-04 LAB — POCT I-STAT CREATININE: Creatinine, Ser: 1.5 mg/dL — ABNORMAL HIGH (ref 0.44–1.00)

## 2023-12-04 MED ORDER — IOHEXOL 350 MG/ML SOLN
50.0000 mL | Freq: Once | INTRAVENOUS | Status: AC | PRN
  Administered 2023-12-04 (×2): 50 mL via INTRAVENOUS

## 2024-01-16 ENCOUNTER — Ambulatory Visit: Admitting: Dermatology

## 2024-01-16 ENCOUNTER — Encounter: Payer: Self-pay | Admitting: Dermatology

## 2024-01-16 DIAGNOSIS — D239 Other benign neoplasm of skin, unspecified: Secondary | ICD-10-CM

## 2024-01-16 DIAGNOSIS — D1801 Hemangioma of skin and subcutaneous tissue: Secondary | ICD-10-CM

## 2024-01-16 DIAGNOSIS — Z1283 Encounter for screening for malignant neoplasm of skin: Secondary | ICD-10-CM | POA: Diagnosis not present

## 2024-01-16 DIAGNOSIS — L821 Other seborrheic keratosis: Secondary | ICD-10-CM

## 2024-01-16 DIAGNOSIS — L570 Actinic keratosis: Secondary | ICD-10-CM | POA: Diagnosis not present

## 2024-01-16 DIAGNOSIS — L814 Other melanin hyperpigmentation: Secondary | ICD-10-CM | POA: Diagnosis not present

## 2024-01-16 DIAGNOSIS — W908XXA Exposure to other nonionizing radiation, initial encounter: Secondary | ICD-10-CM | POA: Diagnosis not present

## 2024-01-16 DIAGNOSIS — D225 Melanocytic nevi of trunk: Secondary | ICD-10-CM

## 2024-01-16 DIAGNOSIS — L578 Other skin changes due to chronic exposure to nonionizing radiation: Secondary | ICD-10-CM | POA: Diagnosis not present

## 2024-01-16 DIAGNOSIS — D229 Melanocytic nevi, unspecified: Secondary | ICD-10-CM

## 2024-01-16 DIAGNOSIS — D2371 Other benign neoplasm of skin of right lower limb, including hip: Secondary | ICD-10-CM

## 2024-01-16 NOTE — Patient Instructions (Addendum)
 Seborrheic Keratosis  What causes seborrheic keratoses? Seborrheic keratoses are harmless, common skin growths that first appear during adult life.  As time goes by, more growths appear.  Some people may develop a large number of them.  Seborrheic keratoses appear on both covered and uncovered body parts.  They are not caused by sunlight.  The tendency to develop seborrheic keratoses can be inherited.  They vary in color from skin-colored to gray, brown, or even black.  They can be either smooth or have a rough, warty surface.   Seborrheic keratoses are superficial and look as if they were stuck on the skin.  Under the microscope this type of keratosis looks like layers upon layers of skin.  That is why at times the top layer may seem to fall off, but the rest of the growth remains and re-grows.    Treatment Seborrheic keratoses do not need to be treated, but can easily be removed in the office.  Seborrheic keratoses often cause symptoms when they rub on clothing or jewelry.  Lesions can be in the way of shaving.  If they become inflamed, they can cause itching, soreness, or burning.  Removal of a seborrheic keratosis can be accomplished by freezing, burning, or surgery. If any spot bleeds, scabs, or grows rapidly, please return to have it checked, as these can be an indication of a skin cancer.  Melanoma ABCDEs  Melanoma is the most dangerous type of skin cancer, and is the leading cause of death from skin disease.  You are more likely to develop melanoma if you: Have light-colored skin, light-colored eyes, or red or blond hair Spend a lot of time in the sun Tan regularly, either outdoors or in a tanning bed Have had blistering sunburns, especially during childhood Have a close family member who has had a melanoma Have atypical moles or large birthmarks  Early detection of melanoma is key since treatment is typically straightforward and cure rates are extremely high if we catch it early.   The  first sign of melanoma is often a change in a mole or a new dark spot.  The ABCDE system is a way of remembering the signs of melanoma.  A for asymmetry:  The two halves do not match. B for border:  The edges of the growth are irregular. C for color:  A mixture of colors are present instead of an even brown color. D for diameter:  Melanomas are usually (but not always) greater than 6mm - the size of a pencil eraser. E for evolution:  The spot keeps changing in size, shape, and color.  Please check your skin once per month between visits. You can use a small mirror in front and a large mirror behind you to keep an eye on the back side or your body.   If you see any new or changing lesions before your next follow-up, please call to schedule a visit.  Please continue daily skin protection including broad spectrum sunscreen SPF 30+ to sun-exposed areas, reapplying every 2 hours as needed when you're outdoors.    Due to recent changes in healthcare laws, you may see results of your pathology and/or laboratory studies on MyChart before the doctors have had a chance to review them. We understand that in some cases there may be results that are confusing or concerning to you. Please understand that not all results are received at the same time and often the doctors may need to interpret multiple results in order to  provide you with the best plan of care or course of treatment. Therefore, we ask that you please give us  2 business days to thoroughly review all your results before contacting the office for clarification. Should we see a critical lab result, you will be contacted sooner.   If You Need Anything After Your Visit  If you have any questions or concerns for your doctor, please call our main line at [phone] and press option 4 to reach your doctor's medical assistant. If no one answers, please leave a voicemail as directed and we will return your call as soon as possible. Messages left after 4  pm will be answered the following business day.   You may also send us  a message via MyChart. We typically respond to MyChart messages within 1-2 business days.  For prescription refills, please ask your pharmacy to contact our office. Our fax number is [phone].  If you have an urgent issue when the clinic is closed that cannot wait until the next business day, you can page your doctor at the number below.    Please note that while we do our best to be available for urgent issues outside of office hours, we are not available 24/7.   If you have an urgent issue and are unable to reach us , you may choose to seek medical care at your doctor's office, retail clinic, urgent care center, or emergency room.  If you have a medical emergency, please immediately call 911 or go to the emergency department.  Pager Numbers  - Dr. Hester: [phone]  - Dr. Jackquline: [phone]  - Dr. Claudene: [phone]   - Dr. Raymund: [phone]  In the event of inclement weather, please call our main line at [phone] for an update on the status of any delays or closures.  Dermatology Medication Tips: Please keep the boxes that topical medications come in in order to help keep track of the instructions about where and how to use these. Pharmacies typically print the medication instructions only on the boxes and not directly on the medication tubes.   If your medication is too expensive, please contact our office at [phone] option 4 or send us  a message through MyChart.   We are unable to tell what your co-pay for medications will be in advance as this is different depending on your insurance coverage. However, we may be able to find a substitute medication at lower cost or fill out paperwork to get insurance to cover a needed medication.   If a prior authorization is required to get your medication covered by your insurance company, please allow us  1-2 business days to complete this  process.  Drug prices often vary depending on where the prescription is filled and some pharmacies may offer cheaper prices.  The website www.goodrx.com contains coupons for medications through different pharmacies. The prices here do not account for what the cost may be with help from insurance (it may be cheaper with your insurance), but the website can give you the price if you did not use any insurance.  - You can print the associated coupon and take it with your prescription to the pharmacy.  - You may also stop by our office during regular business hours and pick up a GoodRx coupon card.  - If you need your prescription sent electronically to a different pharmacy, notify our office through [facility] or by phone at [phone] option 4.     Si Usted Necesita Algo Despus de Su Visita  Tambin puede enviarnos un mensaje a travs de MyChart. Por lo general respondemos a los mensajes de MyChart en el transcurso de 1 a 2 das hbiles.  Para renovar recetas, por favor pida a su farmacia que se ponga en contacto con nuestra oficina. Randi lakes de fax es [place] [phone].  Si tiene un asunto urgente cuando la clnica est cerrada y que no puede esperar hasta el siguiente da hbil, puede llamar/localizar a su doctor(a) al nmero que aparece a continuacin.   Por favor, tenga en cuenta que aunque hacemos todo lo posible para estar disponibles para asuntos urgentes fuera del horario de [place], no estamos disponibles las 24 horas del da, los 7 [address]  [address] de la [place].   Si tiene un problema urgente y no puede comunicarse con nosotros, puede optar por buscar atencin mdica  en el consultorio de su doctor(a), en una clnica privada, en un centro de atencin urgente o en una sala de emergencias.  Si tiene Engineer, drilling, por favor llame inmediatamente al 911 o vaya a la sala de emergencias.  Nmeros de bper  - Dr. Hester: [phone]  - Dra. Jackquline: [phone]  - Dr.  Claudene: [phone]  - Dra. Kitts: [phone]  En caso de inclemencias del [place], por favor llame a nuestra lnea principal al [phone] para una actualizacin sobre el estado de cualquier retraso o cierre.  Consejos para la medicacin en dermatologa: Por favor, guarde las cajas en las que vienen los medicamentos de uso tpico para ayudarle a seguir las instrucciones sobre dnde y cmo usarlos. Las farmacias generalmente imprimen las instrucciones del medicamento slo en las cajas y no directamente en los tubos del [place].   Si su medicamento es muy caro, por favor, pngase en contacto con landry rieger llamando al [phone] y presione la opcin 4 o envenos un mensaje a travs de Clinical cytogeneticist.   No podemos decirle cul ser su copago por los medicamentos por adelantado ya que esto es diferente dependiendo de la cobertura de su seguro. Sin embargo, es posible que podamos encontrar un medicamento sustituto a Audiological scientist un formulario para que el seguro cubra el medicamento que se considera necesario.   Si se requiere una autorizacin previa para que su compaa de seguros malta su medicamento, por favor permtanos de 1 a 2 das hbiles para completar este proceso.  Los precios de los medicamentos varan con frecuencia dependiendo del Environmental consultant de dnde se surte la receta y alguna farmacias pueden ofrecer precios ms baratos.  El sitio web www.goodrx.com tiene cupones para medicamentos de Health and safety inspector. Los precios aqu no tienen en cuenta lo que podra costar con la ayuda del seguro (puede ser ms barato con su seguro), pero el sitio web puede darle el precio si no utiliz Tourist information centre manager.  - Puede imprimir el cupn correspondiente y llevarlo con su receta a la farmacia.  - Tambin puede pasar por nuestra oficina durante el horario de atencin regular y Education officer, museum una tarjeta de cupones de GoodRx.  - Si necesita que su receta se enve electrnicamente a una farmacia diferente,  informe a nuestra oficina a travs de MyChart de [place] o por telfono llamando al [phone] y presione la opcin 4.

## 2024-01-16 NOTE — Progress Notes (Signed)
 Follow-Up Visit   Subjective  Samantha Freeman is a 88 y.o. female who presents for the following: Skin Cancer Screening and Full Body Skin Exam  The patient presents for Total-Body Skin Exam (TBSE) for skin cancer screening and mole check. The patient has spots, moles and lesions to be evaluated, some may be new or changing and the patient may have concern these could be cancer.  Hx AK. Patient with a small dark spot at her right side that she's noticed. Has had to cancel last few appts due to A-Fib and CHF diagnosis. Having ablation in November.   The following portions of the chart were reviewed this encounter and updated as appropriate: medications, allergies, medical history  Review of Systems:  No other skin or systemic complaints except as noted in HPI or Assessment and Plan.  Objective  Well appearing patient in no apparent distress; mood and affect are within normal limits.  A full examination was performed including scalp, head, eyes, ears, nose, lips, neck, chest, axillae, abdomen, back, buttocks, bilateral upper extremities, bilateral lower extremities, hands, feet, fingers, toes, fingernails, and toenails. All findings within normal limits unless otherwise noted below.   Relevant physical exam findings are noted in the Assessment and Plan.  R nasal tip x 1 Erythematous thin papules/macules with gritty scale.   Assessment & Plan   SKIN CANCER SCREENING PERFORMED TODAY.  ACTINIC DAMAGE - Chronic condition, secondary to cumulative UV/sun exposure - diffuse scaly erythematous macules with underlying dyspigmentation - Recommend daily broad spectrum sunscreen SPF 30+ to sun-exposed areas, reapply every 2 hours as needed.  - Staying in the shade or wearing long sleeves, sun glasses (UVA+UVB protection) and wide brim hats (4-inch brim around the entire circumference of the hat) are also recommended for sun protection.  - Call for new or changing lesions.  LENTIGINES,  SEBORRHEIC KERATOSES, HEMANGIOMAS - Benign normal skin lesions - Benign-appearing - Call for any changes  MELANOCYTIC NEVI - Tan-brown and/or pink-flesh-colored symmetric macules and papules - Benign appearing on exam today - Observation - Call clinic for new or changing moles - Recommend daily use of broad spectrum spf 30+ sunscreen to sun-exposed areas.  - 6 mm speckled tan macule at lower left abdomen  SEBORRHEIC KERATOSIS - 3.0 cm pink tan waxy patch and smaller plaque at right upper arm - 1.5 cm waxy tan patch at right neck - waxy tan/brown stuck on papules, including R abdomen - Benign-appearing - Discussed benign etiology and prognosis. - Observe, not bothersome for patient - Call for any changes  DERMATOFIBROMA - right medial lower leg Exam: Firm pink/brown papulenodule with dimple sign.  Treatment Plan: A dermatofibroma is a benign growth possibly related to trauma, such as an insect bite, cut from shaving, or inflamed acne-type bump.  Treatment options to remove include shave or excision with resulting scar and risk of recurrence.  Since benign-appearing and not bothersome, will observe for now.     AK (ACTINIC KERATOSIS) R nasal tip x 1 Actinic keratoses are precancerous spots that appear secondary to cumulative UV radiation exposure/sun exposure over time. They are chronic with expected duration over 1 year. A portion of actinic keratoses will progress to squamous cell carcinoma of the skin. It is not possible to reliably predict which spots will progress to skin cancer and so treatment is recommended to prevent development of skin cancer.  Recommend daily broad spectrum sunscreen SPF 30+ to sun-exposed areas, reapply every 2 hours as needed.  Recommend staying in the  shade or wearing long sleeves, sun glasses (UVA+UVB protection) and wide brim hats (4-inch brim around the entire circumference of the hat). Call for new or changing lesions. Destruction of lesion - R  nasal tip x 1  Destruction method: cryotherapy   Informed consent: discussed and consent obtained   Lesion destroyed using liquid nitrogen: Yes   Region frozen until ice ball extended beyond lesion: Yes   Outcome: patient tolerated procedure well with no complications   Post-procedure details: wound care instructions given   Additional details:  Prior to procedure, discussed risks of blister formation, small wound, skin dyspigmentation, or rare scar following cryotherapy. Recommend Vaseline ointment to treated areas while healing.   Return in about 1 year (around 01/15/2025) for TBSE, with Dr. Jackquline, HxAK.  LILLETTE Lonell Drones, RMA, am acting as scribe for Rexene Jackquline, MD .   Documentation: I have reviewed the above documentation for accuracy and completeness, and I agree with the above.  Rexene Jackquline, MD

## 2024-03-03 ENCOUNTER — Encounter: Payer: Medicare Other | Admitting: Dermatology

## 2024-03-05 ENCOUNTER — Encounter: Admitting: Dermatology

## 2024-03-05 NOTE — Discharge Summary (Signed)
 "                       [facility]                      Electrophysiology Discharge Summary   Admit Date: 03/05/2024  Discharge Date: 03/06/2024  Admitting Physician: Melchor Lynwood Dover, MD  Discharge Physician: JACKSON, KEVIN P, MD  Primary Care Provider: Auston Reyes BIRCH, MD  Primary Electrophysiologist: Melchor Lynwood Dover, MD   Discharge Destination: Home  Discharge Services: none  Code Status: No Order   Admission Diagnoses:  Paroxysmal atrial fibrillation (CMS/HHS-HCC) [I48.0]  Discharge Diagnoses:  Principal Problem:   Paroxysmal atrial fibrillation (CMS/HHS-HCC) Active Problems:   S/P ablation of atrial fibrillation   S/P ablation of atrial flutter Resolved Problems:   * No resolved hospital problems. *     Anticipatory Guidance (key med changes, results pending, future labs, IV therapies):   - s/p ablation of Afib/AFL with Affera - Resume amiodarone  - Reduce Eliquis  dose to 2.5 mg q12h for age, Cr - Discontinue metoprolol  due to bradycardia - Continue home PPI BID - Hold spironolactone  until directed by cardiology to resume - Labs: repeat BMP ordered for Monday, 11/17 to monitor hyperkalemia - EP follow-up at [facility] clinic on 12/17 w/ Dr. Melchor as below     Cardiac Rehab: not indicated for this procedure  Patient Discharge Instructions:   Basic Metabolic Panel (BMP)  Standing Status: Future Standing Exp. Date: 07/04/24  Order Comments: Please fax to Dr. Meryle office at [phone]   Order Specific Question Answer Comments  Release to patient Immediate    Other activity instructions:  Order Comments: See attached instructions.   May shower, but no soaking tub baths  Order Comments: See attached instructions.   If you smoke (or have smoked within the last year), we strongly recommend that you do not smoke.   Weigh yourself daily and record   Notify cardiology provider of chest pain   Notify provider of  swelling in arms, legs, or stomach   Notify Provider of bleeding or expanding swelling at groin catheter access site   Notify provider temperature greater than 101.0 F (38.3 C) degrees   Notify provider of dizziness or passing out   Notify provider of weight gain greater than 2 lbs in 1 day or 5 lbs in 1 week   Notify provider of difficulty breathing or shortness of breath   Report questions or concerns to the [facility] at [phone]   Notify primary care physician of other symptoms   For a life-threatening emergency, call 911   Follow-up with Primary Care Provider   Follow-up with Cardiology   Diabetic  Order Comments: 2000 ml (or 65 oz) total daily fluid restriction    [facility] Provider Follow-up: Future Appointments  Date Time Provider Department Center  03/27/2024  1:30 PM Chasnis, Morene Dunnings, DO [facility]  03/31/2024  2:15 PM Solum, Therisa Setter, MD [facility]  04/06/2024  2:00 PM Parris Manna, MD [facility]  04/08/2024  1:15 PM Auston Reyes BIRCH, MD [facility]  04/08/2024  2:20 PM [facility]  04/13/2024  1:30 PM Chasnis, Morene Dunnings, DO [facility]  05/11/2024  2:30 PM Paraschos, Marsa, MD [facility]  07/28/2024 11:00 AM Jane Delmar Pike, NP [facility]    Non-[facility] Provider Follow-up: none  Report Issues: By  using [facility], or by calling the [facility] at [phone].  For urgent issues or after business hours (after 5pm on weekdays and anytime on weekends), call the [facility] Operator [phone] and ask to page the on-call cardiologist.    Allergies/Intolerances:  Allergies  Allergen Reactions   Buspar [Buspirone] Dizziness   Propofol  Anaphylaxis   Ambien [Zolpidem] Hallucination   Cholestyramine Itching and Rash   Codeine Nausea   Hydrochlorothiazide  Other (See Comments)    PATIENT DOES  NOT REMEMBER   Niacin Rash   Tavist Nd [Loratadine ] Other (See Comments)    PATIENT DOES NOT REMEMBER     Medications:     Discharge Medications     PAUSE taking these medications      Details  spironolactone  25 MG tablet Wait to take this until your doctor or other care provider tells you to start again. Commonly known as: ALDACTONE   25 mg, Oral, Daily Quantity: 90 tablet Refills: 1       Modified Medications      Details  AMIOdarone  200 MG tablet Commonly known as: PACERONE  What changed:  how much to take how to take this when to take this additional instructions  200 mg, Oral, Daily Quantity: 90 tablet Refills: 0   apixaban  2.5 mg tablet Commonly known as: ELIQUIS  What changed:  medication strength how much to take  2.5 mg, Oral, Every 12 hours Quantity: 180 tablet Refills: 2       Medications To Continue      Details  acetaminophen  500 mg capsule Commonly known as: TYLENOL   500 mg, Every 6 hours PRN Refills: 0   ALPRAZolam  0.5 MG tablet Commonly known as: XANAX   0.5 mg, Oral, Nightly Quantity: 90 tablet Refills: 1   atorvastatin  20 MG tablet Commonly known as: LIPITOR  20 mg, Oral, Daily Quantity: 30 tablet Refills: 11   COQ-10 ORAL  1 tablet, Daily Refills: 0   CYANOCOBALAMIN  (VITAMIN B-12) SL  1,000 mcg, Daily Refills: 0   FARXIGA  10 mg tablet Generic drug: dapagliflozin  propanediol  TAKE 10 MG BY MOUTH 1 (ONE) TIME EACH DAY Refills: 0   fenofibrate  nanocrystallized 48 MG tablet Commonly known as: TRICOR   48 mg, Every morning Refills: 0   ferrous sulfate  325 (65 FE) MG tablet  325 mg, Daily Refills: 0   FREESTYLE LIBRE 3 PLUS SENSOR Devi  1 each, Misc.(Non-Drug; Combo Route), Every 15 days Quantity: 6 each Refills: 3   FUROsemide  40 MG tablet Commonly known as: LASIX   40 mg, Oral, Daily Quantity: 90 tablet Refills: 1   GLUCERNA SHAKE Liqd Generic drug: nut.tx.gluc.intol,lac-free,soy  237 mLs,  Nightly Refills: 0   Herbal Supplement  Herbal Name: Blood Balance- Blood optimizer Refills: 0   insulin  LISPRO pen injector (concentration 100 units/mL) Commonly known as: HumaLOG KwikPen Insulin   Using sliding scale, 10-30 units Refills: 0   ipratropium 0.06 % nasal spray Commonly known as: ATROVENT   USE 2 PUFFS EACH NOSTRIL AS NEEDED FOR RUNNY NOSE EVERY 8 HOURS Refills: 0   LANTUS  SOLOSTAR U-100 INSULIN  pen injector (concentration 100 units/mL) Generic drug: insulin  GLARGINE  40 Units, Subcutaneous, Nightly Quantity: 45 mL Refills: 1   magnesium  oxide 400 mg (241.3 mg magnesium ) tablet Commonly known as: MAG-OX  400 mg, 3 times Daily Refills: 3   NANO 2ND GEN PEN NEEDLE 32 gauge x 5/32 Ndle Generic drug: pen needle, diabetic  INJECT 1 EACH SUBCUTANEOUSLY 4 (FOUR) TIMES DAILY Quantity: 400 each Refills:  1   omeprazole 20 MG EC tablet Commonly known as: PriLOSEC OTC  20 mg, Oral, 2 times Daily Refills: 0   PARoxetine  10 MG tablet Commonly known as: PAXIL   10 mg, Oral, Nightly Refills: 0   prednisoLONE  acetate 1 % ophthalmic suspension Commonly known as: PRED FORTE   INSTILL 1 DROP INTO BOTH EYES AT NIGHT Quantity: 15 mL Refills: 1   psyllium husk (with sugar) 3.4 gram/12 gram oral powder Commonly known as: METAMUCIL  1.7 g, Daily Refills: 0   rOPINIRole  1 MG immediate release tablet Commonly known as: REQUIP   1 mg, Oral, 3 times Daily Quantity: 270 tablet Refills: 1       Stopped Medications    metoprolol  TARTrate 50 MG tablet Commonly known as: LOPRESSOR           Anticoagulation: Prescribed INR goal: Not applicable   Brief History of Present Illness: Per the H&P dated on 03/05/2024: 88yo woman with HTN, HFpEF, h/o PE, and persistent atrial fibrillation presents for Afib ablation with Affera.  __________  Hospital Course by Problem:   On 03/05/2024, Dr. Melchor proceeded with catheter ablation of Afib/AFL. Patient will resume  amiodarone  for rate/rhythm control and Eliquis  for anticoagulation. Discontinue metoprolol  due to bradycardia. Please see operative note for details. Patient tolerated the procedure well and had no immediate post-procedure complications. 24 hour telemetry showed SB/SR. Hyperkalemia (K 5.7, hemolyzed) noted on day of discharge. Suspect true potassium level is lower due to hemolysis. Holding spironolactone  and repeating a BMP on Monday, 11/17. Dr. Merline to determine when it is safe to resume spironolactone . At time of discharge, patient appeared euvolemic without complaints of chest pain, SOB, N/V, F/C, urinary retention, and no evidence of groin bleed. Discharged to home in stable condition.      Comorbid Conditions: Electrolyte Disorders:    Hyponatremia:  Hyponatremia present with lowest sodium of 133.  Will continue to monitor.     Hyperkalemia:  Hyperkalemia present with highest potassium of 5.8.  Will continue to monitor.     Hypomagnesemia:  Hypomagnesemia present with lowest magnesium  of 1.5.   Will continue to monitor.   Coagulation Disorders:    Elevated INR:  Coagulopathy present with highest INR of 1.5.  Will continue to monitor and assess for bleeding complications.          Imaging and Procedures Performed:   Ablation op note 03/05/2024: CONCLUSIONS: 1. The initial atrial rhythm was: atrial fibrillation   2. AV nodal and His-Purkinje conduction assessment demonstrated:  --AH 98, HV 58  --AVN WBB 400,  -VA WBB: - Conduction was normal.   3. Ultrasound imaging disclosed normal RA, septal and LA anatomy with no  pericardial effusion.   4. Initial voltage mapping and electrogram analysis of LA (and RA if done) disclosed:   --(in SR after PVI) dense scar much of anterior wall and septum and some patchy scar;  -preserved voltage posterior isthmus region;  --dense and patch scar posterior wall in AF   5. The following organized atrial arrhythmias were identified CTI dependent  atrial flutter CCL mapped and ablated    6. The ablation lesion sets included:   A)  Successful wide area circumferential pulmonary vein isolation;  B) Successful LA roof line for scar posterior wall and persistent AF after PVI C) Successful Post wall isolation  for scar posterior wall and persistent AF after PVI   Plans: Routine post procedure vital signs, bed rest and monitoring.   Post-ablation medication plan is: -Anti-arrhythmic drug:  amiodarone  continuation; stop in 2-3 months -Anti-coagulation:  apixaban  -Colchicine: No -Proton pump inhibitor q12h:  Yes   Follow-up will be planned with the referring cardiologist and with [facility].    _____________________  Discharge Exam:  Admission Weight: 86 kg (189 lb 9.5 oz)  Discharge Weight: Weight: 89.1 kg (196 lb 6.9 oz) BMI: Body mass index is 33.7 kg/m. BP 138/69   Pulse 53   Temp 36.6 C (97.8 F) (Oral)   Resp 19   Ht 162.6 cm (5' 4.02)   Wt 89.1 kg (196 lb 6.9 oz)   SpO2 95%   BMI 33.70 kg/m   General: alert, cooperative, and in NAD Respiratory: regular rate, symmetric, unlabored, clear to auscultation bilaterally, and no accessory muscle use Cardiac: bradycardic, regular rhythm, S1, S2 present, no murmur, no rub, no gallop, and JVD non-elevated Abdomen: normal bowel sounds, soft, nontender, and nondistended Extremities: extremities warm and well perfused, no clubbing or cyanosis, no edema, and distal pulses intact right groin site stable with good pulses, no hematoma or bruit Lines: none  Pertinent Lab Testing:  BMP: Recent Labs  Lab 03/06/24 0420  NA 133*  K 5.7*  CL 99  CO2 20*  BUN 57*  CREATININE 2.1*  GLUCOSE 272*  CALCIUM  8.6*  MG 1.5*   CBC: Recent Labs  Lab 03/06/24 0420  WBC 8.9  HGB 14.1  HCT 42.0  PLT 205   INR: Recent Labs  Lab 03/05/24 1253  INR 1.5*    TFTs: No results for input(s): TSH, T4FREE in the last 168 hours.       Other Pertinent Labs:   None  _____________________  Time spent on discharge process: >30 minutes  MICHELLE CASTILE MARTWICK, NP  ------------------------------------------------------------------------------- Attestation signed by Leonce Franky Dover, MD at 03/06/2024 11:42 AM I, Franky Leonce, MD, the EP attending physician performed the history and physical examination and reviewed the chart data including test results. I formulated the assessment and plan on rounds with the physician extender. I have reviewed this note and made all the necessary edits to it.   ------------------------------------------------------------------------------- "

## 2024-04-10 IMAGING — MG MM DIGITAL DIAGNOSTIC UNILAT*L* W/ TOMO W/ CAD
4 series · 4 of 12 positions shown · non-contrast
Comparison: Previous exam(s).

CLINICAL DATA: Callback for LEFT breast asymmetry seen on MLO view
only.

EXAM:
DIGITAL DIAGNOSTIC UNILATERAL LEFT MAMMOGRAM WITH TOMOSYNTHESIS AND
CAD
TECHNIQUE: Left digital diagnostic mammography and breast tomosynthesis was
performed. The images were evaluated with computer-aided detection.

[L ML synth-2D]
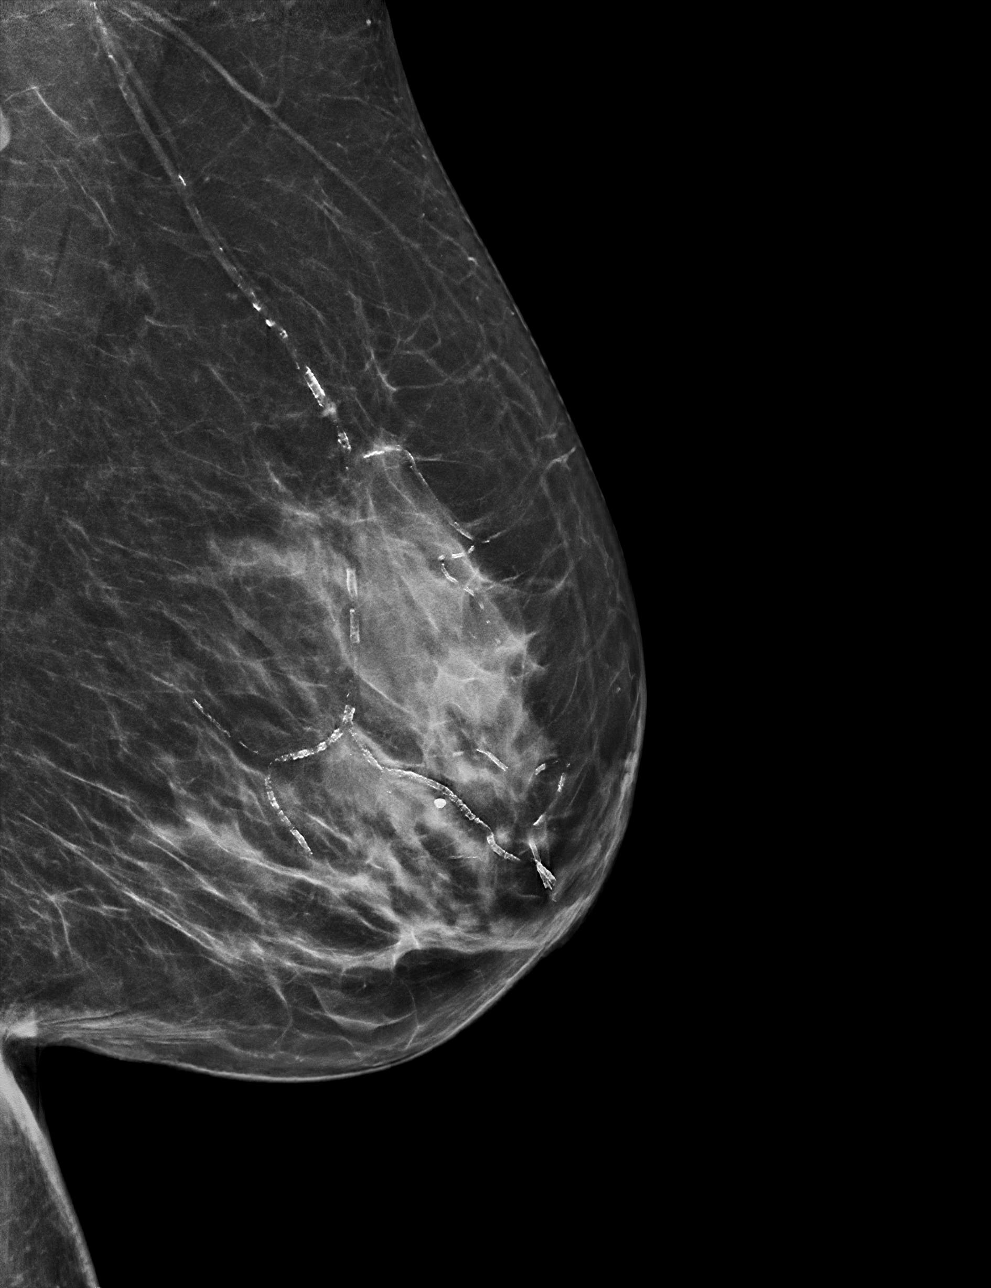

[L MLO synth-2D]
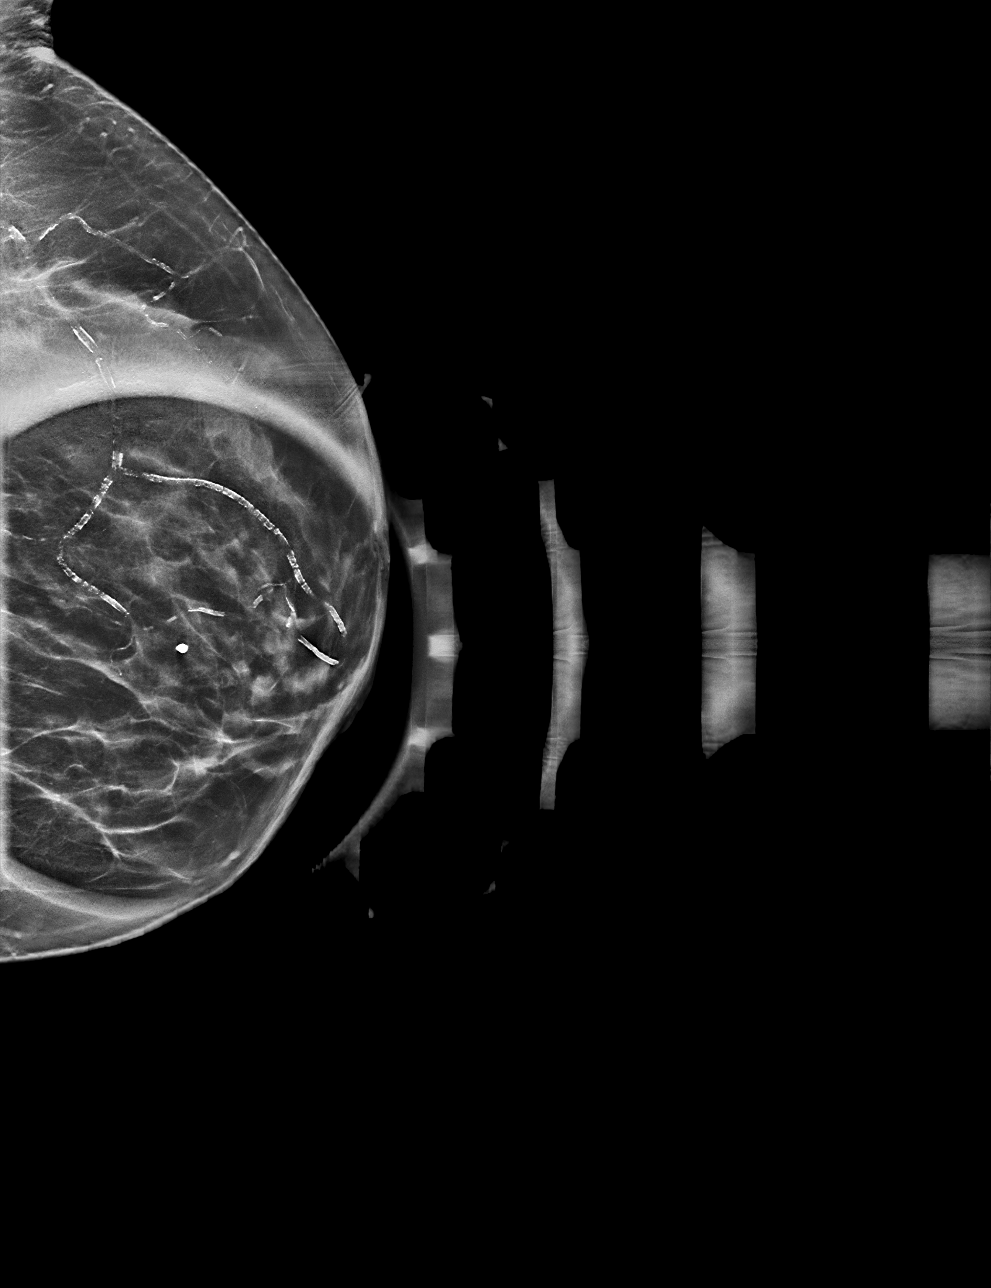

[L MLO tomo · tomo slice 29/57.0]
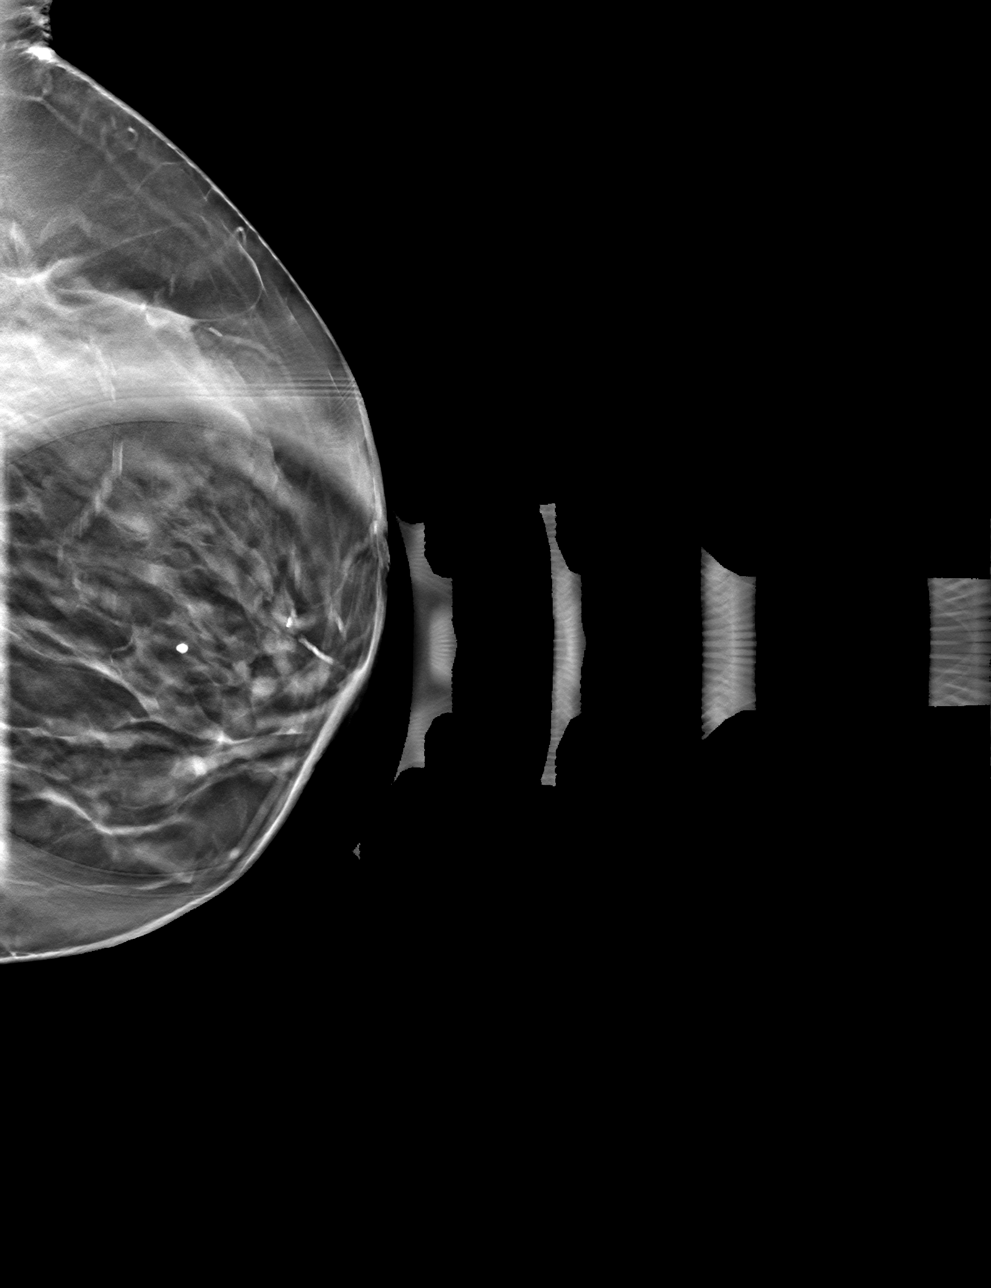

[L ML tomo · tomo slice 33/64.0]
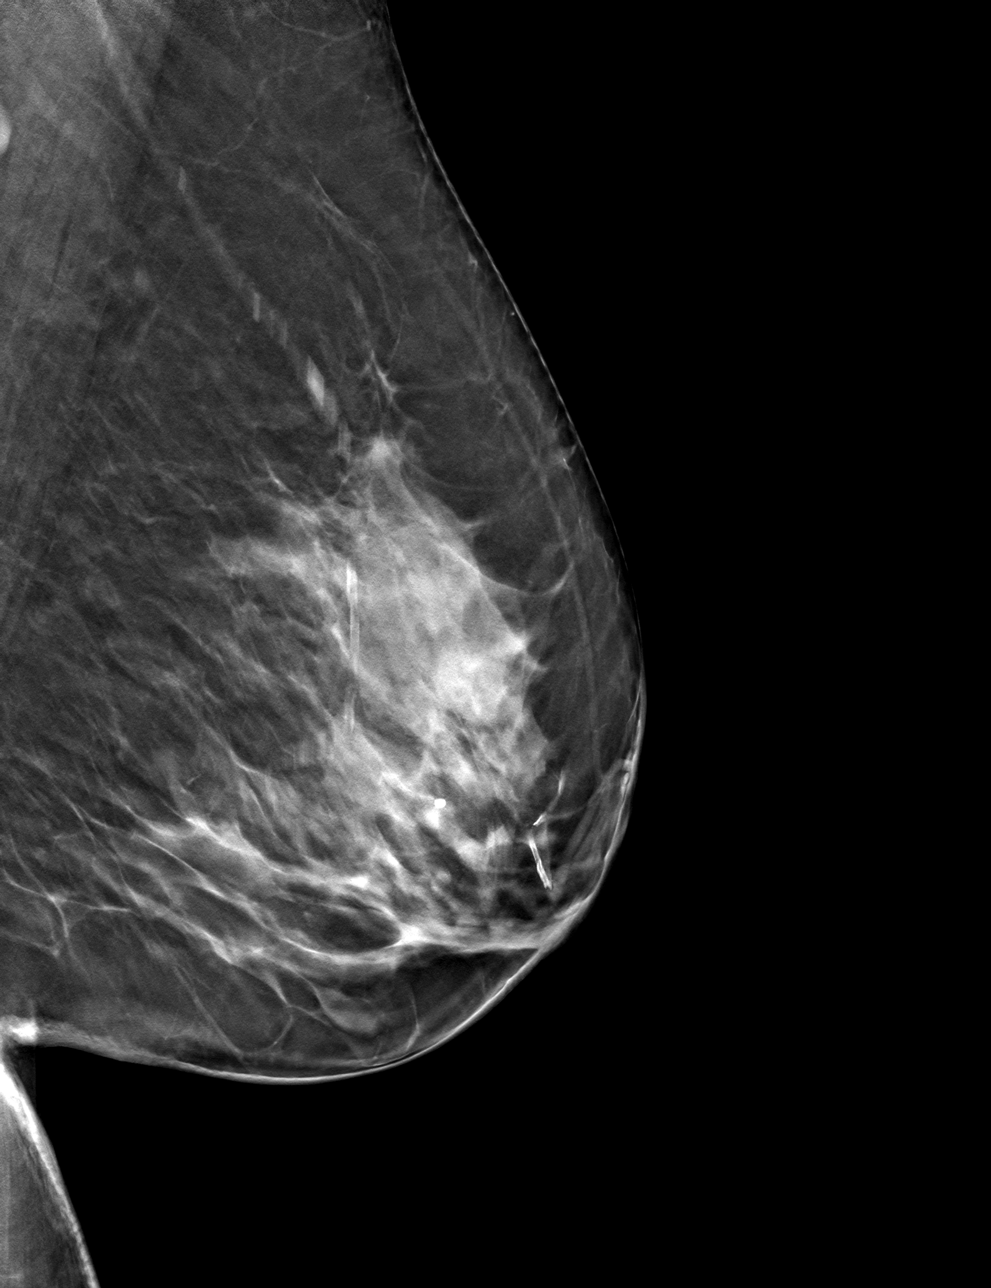

[4 of 12 positions shown; findings below may reference images not displayed]

ACR Breast Density Category c: The breast tissue is heterogeneously
dense, which may obscure small masses.
FINDINGS: The previously described finding does not persist with additional
views, consistent with superimposed fibroglandular tissue.
Fibroglandular tissue assumes a configuration stable in comparison
to remote prior mammograms. No suspicious mass, microcalcification,
or other finding is identified.
IMPRESSION: No mammographic evidence of malignancy.

RECOMMENDATION:
Screening mammogram in one year.(Code:[id number]-4-RD2)

I have discussed the findings and recommendations with the patient.
If applicable, a reminder letter will be sent to the patient
regarding the next appointment.

BI-RADS CATEGORY  1: Negative.

## 2024-05-04 ENCOUNTER — Inpatient Hospital Stay
Admission: EM | Admit: 2024-05-04 | Discharge: 2024-05-07 | DRG: 291 | Disposition: A | Discharge status: Home / Self Care | Attending: Internal Medicine | Admitting: Internal Medicine

## 2024-05-04 ENCOUNTER — Emergency Department

## 2024-05-04 ENCOUNTER — Other Ambulatory Visit: Payer: Self-pay

## 2024-05-04 DIAGNOSIS — Z885 Allergy status to narcotic agent status: Secondary | ICD-10-CM

## 2024-05-04 DIAGNOSIS — Z888 Allergy status to other drugs, medicaments and biological substances status: Secondary | ICD-10-CM

## 2024-05-04 DIAGNOSIS — Z8616 Personal history of COVID-19: Secondary | ICD-10-CM

## 2024-05-04 DIAGNOSIS — Z947 Corneal transplant status: Secondary | ICD-10-CM | POA: Diagnosis not present

## 2024-05-04 DIAGNOSIS — E1122 Type 2 diabetes mellitus with diabetic chronic kidney disease: Secondary | ICD-10-CM | POA: Diagnosis present

## 2024-05-04 DIAGNOSIS — I5033 Acute on chronic diastolic (congestive) heart failure: Secondary | ICD-10-CM | POA: Diagnosis present

## 2024-05-04 DIAGNOSIS — Z96612 Presence of left artificial shoulder joint: Secondary | ICD-10-CM | POA: Diagnosis present

## 2024-05-04 DIAGNOSIS — Z794 Long term (current) use of insulin: Secondary | ICD-10-CM

## 2024-05-04 DIAGNOSIS — R0902 Hypoxemia: Secondary | ICD-10-CM | POA: Diagnosis present

## 2024-05-04 DIAGNOSIS — Z86711 Personal history of pulmonary embolism: Secondary | ICD-10-CM

## 2024-05-04 DIAGNOSIS — Z96651 Presence of right artificial knee joint: Secondary | ICD-10-CM | POA: Diagnosis present

## 2024-05-04 DIAGNOSIS — Z79899 Other long term (current) drug therapy: Secondary | ICD-10-CM | POA: Diagnosis not present

## 2024-05-04 DIAGNOSIS — Z66 Do not resuscitate: Secondary | ICD-10-CM | POA: Diagnosis present

## 2024-05-04 DIAGNOSIS — I130 Hypertensive heart and chronic kidney disease with heart failure and stage 1 through stage 4 chronic kidney disease, or unspecified chronic kidney disease: Secondary | ICD-10-CM | POA: Diagnosis present

## 2024-05-04 DIAGNOSIS — Z7901 Long term (current) use of anticoagulants: Secondary | ICD-10-CM | POA: Diagnosis not present

## 2024-05-04 DIAGNOSIS — Z7985 Long-term (current) use of injectable non-insulin antidiabetic drugs: Secondary | ICD-10-CM

## 2024-05-04 DIAGNOSIS — F32A Depression, unspecified: Secondary | ICD-10-CM | POA: Diagnosis present

## 2024-05-04 DIAGNOSIS — Z6832 Body mass index (BMI) 32.0-32.9, adult: Secondary | ICD-10-CM | POA: Diagnosis not present

## 2024-05-04 DIAGNOSIS — E669 Obesity, unspecified: Secondary | ICD-10-CM | POA: Diagnosis present

## 2024-05-04 DIAGNOSIS — Z79890 Hormone replacement therapy (postmenopausal): Secondary | ICD-10-CM

## 2024-05-04 DIAGNOSIS — Z884 Allergy status to anesthetic agent status: Secondary | ICD-10-CM | POA: Diagnosis not present

## 2024-05-04 DIAGNOSIS — F419 Anxiety disorder, unspecified: Secondary | ICD-10-CM | POA: Diagnosis present

## 2024-05-04 DIAGNOSIS — I161 Hypertensive emergency: Secondary | ICD-10-CM | POA: Diagnosis present

## 2024-05-04 DIAGNOSIS — Z1152 Encounter for screening for COVID-19: Secondary | ICD-10-CM | POA: Diagnosis not present

## 2024-05-04 DIAGNOSIS — I480 Paroxysmal atrial fibrillation: Secondary | ICD-10-CM | POA: Diagnosis present

## 2024-05-04 DIAGNOSIS — K589 Irritable bowel syndrome without diarrhea: Secondary | ICD-10-CM | POA: Diagnosis present

## 2024-05-04 DIAGNOSIS — E039 Hypothyroidism, unspecified: Secondary | ICD-10-CM | POA: Diagnosis not present

## 2024-05-04 DIAGNOSIS — I509 Heart failure, unspecified: Secondary | ICD-10-CM

## 2024-05-04 DIAGNOSIS — E785 Hyperlipidemia, unspecified: Secondary | ICD-10-CM | POA: Diagnosis present

## 2024-05-04 DIAGNOSIS — I10 Essential (primary) hypertension: Secondary | ICD-10-CM | POA: Diagnosis not present

## 2024-05-04 DIAGNOSIS — J9621 Acute and chronic respiratory failure with hypoxia: Secondary | ICD-10-CM | POA: Diagnosis not present

## 2024-05-04 DIAGNOSIS — K219 Gastro-esophageal reflux disease without esophagitis: Secondary | ICD-10-CM | POA: Diagnosis present

## 2024-05-04 DIAGNOSIS — N1831 Chronic kidney disease, stage 3a: Secondary | ICD-10-CM | POA: Diagnosis present

## 2024-05-04 LAB — COMPREHENSIVE METABOLIC PANEL WITH GFR
ALT: 16 U/L (ref 0–44)
AST: 33 U/L (ref 15–41)
Albumin: 4.2 g/dL (ref 3.5–5.0)
Alkaline Phosphatase: 64 U/L (ref 38–126)
Anion gap: 13 (ref 5–15)
BUN: 19 mg/dL (ref 8–23)
CO2: 27 mmol/L (ref 22–32)
Calcium: 9.7 mg/dL (ref 8.9–10.3)
Chloride: 102 mmol/L (ref 98–111)
Creatinine, Ser: 1.11 mg/dL — ABNORMAL HIGH (ref 0.44–1.00)
GFR, Estimated: 48 mL/min — ABNORMAL LOW
Glucose, Bld: 171 mg/dL — ABNORMAL HIGH (ref 70–99)
Potassium: 3.7 mmol/L (ref 3.5–5.1)
Sodium: 142 mmol/L (ref 135–145)
Total Bilirubin: 0.6 mg/dL (ref 0.0–1.2)
Total Protein: 7.1 g/dL (ref 6.5–8.1)

## 2024-05-04 LAB — CBC WITH DIFFERENTIAL/PLATELET
Abs Immature Granulocytes: 0.04 K/uL (ref 0.00–0.07)
Basophils Absolute: 0.1 K/uL (ref 0.0–0.1)
Basophils Relative: 1 %
Eosinophils Absolute: 0.3 K/uL (ref 0.0–0.5)
Eosinophils Relative: 4 %
HCT: 46 % (ref 36.0–46.0)
Hemoglobin: 15 g/dL (ref 12.0–15.0)
Immature Granulocytes: 1 %
Lymphocytes Relative: 35 %
Lymphs Abs: 2.3 K/uL (ref 0.7–4.0)
MCH: 33.6 pg (ref 26.0–34.0)
MCHC: 32.6 g/dL (ref 30.0–36.0)
MCV: 103.1 fL — ABNORMAL HIGH (ref 80.0–100.0)
Monocytes Absolute: 0.6 K/uL (ref 0.1–1.0)
Monocytes Relative: 8 %
Neutro Abs: 3.5 K/uL (ref 1.7–7.7)
Neutrophils Relative %: 51 %
Platelets: 208 K/uL (ref 150–400)
RBC: 4.46 MIL/uL (ref 3.87–5.11)
RDW: 13.8 % (ref 11.5–15.5)
WBC: 6.7 K/uL (ref 4.0–10.5)
nRBC: 0 % (ref 0.0–0.2)

## 2024-05-04 LAB — RESP PANEL BY RT-PCR (RSV, FLU A&B, COVID)  RVPGX2
Influenza A by PCR: NEGATIVE
Influenza B by PCR: NEGATIVE
Resp Syncytial Virus by PCR: NEGATIVE
SARS Coronavirus 2 by RT PCR: NEGATIVE

## 2024-05-04 LAB — MAGNESIUM: Magnesium: 1.8 mg/dL (ref 1.7–2.4)

## 2024-05-04 LAB — TROPONIN T, HIGH SENSITIVITY
Troponin T High Sensitivity: 26 ng/L — ABNORMAL HIGH (ref 0–19)
Troponin T High Sensitivity: 41 ng/L — ABNORMAL HIGH (ref 0–19)

## 2024-05-04 LAB — CBG MONITORING, ED
Glucose-Capillary: 279 mg/dL — ABNORMAL HIGH (ref 70–99)
Glucose-Capillary: 219 mg/dL — ABNORMAL HIGH (ref 70–99)
Glucose-Capillary: 145 mg/dL — ABNORMAL HIGH (ref 70–99)
Glucose-Capillary: 161 mg/dL — ABNORMAL HIGH (ref 70–99)

## 2024-05-04 LAB — PRO BRAIN NATRIURETIC PEPTIDE: Pro Brain Natriuretic Peptide: 373 pg/mL — ABNORMAL HIGH

## 2024-05-04 MED ORDER — IPRATROPIUM BROMIDE 0.02 % IN SOLN: 0.5000 mg | Freq: Four times a day (QID) | RESPIRATORY_TRACT | Status: DC | PRN

## 2024-05-04 MED ORDER — ROPINIROLE HCL 1 MG PO TABS: 1.0000 mg | ORAL_TABLET | Freq: Every day | ORAL | Status: DC

## 2024-05-04 MED ORDER — DAPAGLIFLOZIN PROPANEDIOL 10 MG PO TABS
10.0000 mg | ORAL_TABLET | Freq: Every day | ORAL | Status: DC
  Administered 2024-05-04 – 2024-05-07 (×4): 10 mg via ORAL
  Filled 2024-05-04 (×4): qty 1

## 2024-05-04 MED ORDER — LISINOPRIL 20 MG PO TABS
20.0000 mg | ORAL_TABLET | Freq: Every day | ORAL | Status: DC
  Administered 2024-05-04 – 2024-05-07 (×4): 20 mg via ORAL
  Filled 2024-05-04: qty 2
  Filled 2024-05-04 (×2): qty 1
  Filled 2024-05-04: qty 2

## 2024-05-04 MED ORDER — ROPINIROLE HCL 1 MG PO TABS
1.0000 mg | ORAL_TABLET | Freq: Three times a day (TID) | ORAL | Status: DC
  Administered 2024-05-04 – 2024-05-07 (×9): 1 mg via ORAL
  Filled 2024-05-04 (×9): qty 1

## 2024-05-04 MED ORDER — ATORVASTATIN CALCIUM 20 MG PO TABS
20.0000 mg | ORAL_TABLET | Freq: Every day | ORAL | Status: DC
  Administered 2024-05-04 – 2024-05-07 (×4): 20 mg via ORAL
  Filled 2024-05-04 (×4): qty 1

## 2024-05-04 MED ORDER — SPIRONOLACTONE 25 MG PO TABS
25.0000 mg | ORAL_TABLET | Freq: Every day | ORAL | Status: DC
  Administered 2024-05-04 – 2024-05-07 (×4): 25 mg via ORAL
  Filled 2024-05-04 (×4): qty 1

## 2024-05-04 MED ORDER — INSULIN GLARGINE-YFGN 100 UNIT/ML ~~LOC~~ SOLN
20.0000 [IU] | Freq: Every day | SUBCUTANEOUS | Status: DC
  Administered 2024-05-04 – 2024-05-07 (×4): 20 [IU] via SUBCUTANEOUS
  Filled 2024-05-04 (×5): qty 0.2

## 2024-05-04 MED ORDER — ACETAMINOPHEN 325 MG PO TABS
650.0000 mg | ORAL_TABLET | ORAL | Status: DC | PRN
  Administered 2024-05-04 – 2024-05-06 (×6): 650 mg via ORAL
  Filled 2024-05-04 (×6): qty 2

## 2024-05-04 MED ORDER — HYDRALAZINE HCL 20 MG/ML IJ SOLN: 5.0000 mg | Freq: Four times a day (QID) | INTRAMUSCULAR | Status: DC | PRN

## 2024-05-04 MED ORDER — SODIUM CHLORIDE 0.9% FLUSH: 3.0000 mL | INTRAVENOUS | Status: DC | PRN

## 2024-05-04 MED ORDER — IPRATROPIUM BROMIDE 0.06 % NA SOLN: 2.0000 | Freq: Three times a day (TID) | NASAL | Status: DC

## 2024-05-04 MED ORDER — SODIUM CHLORIDE 0.9 % IV SOLN: 250.0000 mL | INTRAVENOUS | Status: AC | PRN

## 2024-05-04 MED ORDER — INSULIN ASPART 100 UNIT/ML IJ SOLN
0.0000 [IU] | Freq: Every day | INTRAMUSCULAR | Status: DC
  Administered 2024-05-05 – 2024-05-06 (×2): 2 [IU] via SUBCUTANEOUS
  Filled 2024-05-04: qty 2
  Filled 2024-05-04: qty 5

## 2024-05-04 MED ORDER — FUROSEMIDE 10 MG/ML IJ SOLN
40.0000 mg | Freq: Two times a day (BID) | INTRAMUSCULAR | Status: AC
  Administered 2024-05-04 – 2024-05-05 (×2): 40 mg via INTRAVENOUS
  Filled 2024-05-04 (×2): qty 4

## 2024-05-04 MED ORDER — APIXABAN 5 MG PO TABS
5.0000 mg | ORAL_TABLET | Freq: Two times a day (BID) | ORAL | Status: DC
  Administered 2024-05-04 – 2024-05-07 (×7): 5 mg via ORAL
  Filled 2024-05-04 (×7): qty 1

## 2024-05-04 MED ORDER — AMIODARONE HCL 200 MG PO TABS
200.0000 mg | ORAL_TABLET | Freq: Every day | ORAL | Status: DC
  Administered 2024-05-04 – 2024-05-07 (×4): 200 mg via ORAL
  Filled 2024-05-04 (×4): qty 1

## 2024-05-04 MED ORDER — PANTOPRAZOLE SODIUM 40 MG PO TBEC
40.0000 mg | DELAYED_RELEASE_TABLET | Freq: Every day | ORAL | Status: DC
  Administered 2024-05-04 – 2024-05-07 (×4): 40 mg via ORAL
  Filled 2024-05-04 (×4): qty 1

## 2024-05-04 MED ORDER — ALPRAZOLAM 0.5 MG PO TABS
0.5000 mg | ORAL_TABLET | Freq: Every evening | ORAL | Status: DC | PRN
  Administered 2024-05-04 – 2024-05-06 (×3): 0.5 mg via ORAL
  Filled 2024-05-04 (×3): qty 1

## 2024-05-04 MED ORDER — SODIUM CHLORIDE 0.9% FLUSH
3.0000 mL | Freq: Two times a day (BID) | INTRAVENOUS | Status: DC
  Administered 2024-05-04 – 2024-05-07 (×6): 3 mL via INTRAVENOUS

## 2024-05-04 MED ORDER — IPRATROPIUM BROMIDE 0.03 % NA SOLN: 2.0000 | Freq: Three times a day (TID) | NASAL | Status: DC | PRN

## 2024-05-04 MED ORDER — PAROXETINE HCL 10 MG PO TABS
10.0000 mg | ORAL_TABLET | Freq: Every day | ORAL | Status: DC
  Administered 2024-05-04 – 2024-05-07 (×4): 10 mg via ORAL
  Filled 2024-05-04 (×4): qty 1

## 2024-05-04 MED ORDER — LEVOTHYROXINE SODIUM 50 MCG PO TABS
50.0000 ug | ORAL_TABLET | Freq: Every day | ORAL | Status: DC
  Administered 2024-05-05 – 2024-05-07 (×3): 50 ug via ORAL
  Filled 2024-05-04 (×3): qty 1

## 2024-05-04 MED ORDER — FENOFIBRATE 54 MG PO TABS
54.0000 mg | ORAL_TABLET | Freq: Every day | ORAL | Status: DC
  Administered 2024-05-04 – 2024-05-07 (×4): 54 mg via ORAL
  Filled 2024-05-04 (×4): qty 1

## 2024-05-04 MED ORDER — GABAPENTIN 100 MG PO CAPS: 200.0000 mg | ORAL_CAPSULE | Freq: Three times a day (TID) | ORAL | Status: DC

## 2024-05-04 MED ORDER — ONDANSETRON HCL 4 MG/2ML IJ SOLN: 4.0000 mg | Freq: Four times a day (QID) | INTRAMUSCULAR | Status: DC | PRN

## 2024-05-04 MED ORDER — FUROSEMIDE 10 MG/ML IJ SOLN
60.0000 mg | Freq: Once | INTRAMUSCULAR | Status: AC
  Administered 2024-05-04: 60 mg via INTRAVENOUS
  Filled 2024-05-04: qty 8

## 2024-05-04 MED ORDER — INSULIN ASPART 100 UNIT/ML IJ SOLN
0.0000 [IU] | Freq: Three times a day (TID) | INTRAMUSCULAR | Status: DC
  Administered 2024-05-04: 2 [IU] via SUBCUTANEOUS
  Administered 2024-05-04 (×2): 8 [IU] via SUBCUTANEOUS
  Administered 2024-05-05 (×2): 3 [IU] via SUBCUTANEOUS
  Administered 2024-05-05 – 2024-05-06 (×2): 5 [IU] via SUBCUTANEOUS
  Administered 2024-05-06: 3 [IU] via SUBCUTANEOUS
  Administered 2024-05-06: 5 [IU] via SUBCUTANEOUS
  Administered 2024-05-07: 3 [IU] via SUBCUTANEOUS
  Filled 2024-05-04 (×2): qty 3
  Filled 2024-05-04: qty 8
  Filled 2024-05-04: qty 2
  Filled 2024-05-04 (×2): qty 5
  Filled 2024-05-04: qty 3
  Filled 2024-05-04: qty 5
  Filled 2024-05-04: qty 8
  Filled 2024-05-04: qty 3

## 2024-05-04 NOTE — ED Triage Notes (Signed)
 Pt to ED via [facility] from home for SOB. Pt was at 88% on room air upon EMS arrival. Pt was on 4L  upon arrival with 02 at 98%. Pt was hypertensive for EMS at 225/107.  Pt began feeling SOB and flu like symptoms after church yesterday.   Pt A&Ox4 and talkative upon arrival. EDP at bedside upon pt's arrival.

## 2024-05-04 NOTE — H&P (Signed)
 " History and Physical    Samantha Freeman FMW:[id number] DOB: [birth year] DOA: 05/04/2024  PCP: Samantha Reyes BIRCH, MD (Confirm with patient/family/NH records and if not entered, this has to be entered at [facility] point of entry) Patient coming from: Home  I have personally briefly reviewed patient's old medical records in [facility]  Chief Complaint: SOB  HPI: Samantha Freeman is a 89 y.o. female with medical history significant of PAF and PE on Eliquis , HTN, chronic HFpEF, CKD stage IIIa, IDDM, HLD, IBS, anxiety/depression, presented with worsening of shortness of breath.  Patient reported that she underwent ablation for A-fib/a flutter in November last year, and at some point afterwards, her metoprolol  was discontinued and she could not tell for what reason.  She checked her blood pressure occasionally and found her BP has been running high with SBP in the range of 170-180 last week and increasingly she is been feeling more shortness of breath she has had some dry cough and feeling  pressure-like sensation in the chest but denied any chest pain no fever or chills.  She reported recent weight gain but no significant ankle swelling.  ED Course: Afebrile, no tachycardia blood pressure 190/80 O2 saturation 95% on 4 L.  Chest x-ray showed moderate pulmonary congestion, blood work showed BUN 19 creatinine 1.1 WBC 6.7 hemoglobin 15.  Troponin 26> 41, proBNP 373, EKG normal sinus rhythm, no acute ST changes.  Patient was given IV Lasix  40 mg x 1 and pulled out 600 mL of urine and started to feel better.  Review of Systems: As per HPI otherwise 14 point review of systems negative.    Past Medical History:  Diagnosis Date   Anemia    Anxiety    Aortic stenosis, mild    Arthritis    CKD (chronic kidney disease), stage III (HCC)    Complication of anesthesia    Propofol  anaphylaxis   Cortical cataract    DDD (degenerative disc disease), lumbar    Depression    Dyspnea    GERD (gastroesophageal  reflux disease)    Headache    Heart murmur    History of 2019 novel coronavirus disease (COVID-19) 12/01/2018   HLD (hyperlipidemia)    Hypertension    Pneumonia    Schatzki's ring    Seasonal allergies    T2DM (type 2 diabetes mellitus) (HCC)    Valvular insufficiency     Past Surgical History:  Procedure Laterality Date   ABDOMINAL HYSTERECTOMY     APPENDECTOMY     BICEPT TENODESIS Right 05/13/2018   Procedure: BICEPS TENODESIS;  Surgeon: Edie Norleen PARAS, MD;  Location: [facility] ORS;  Service: Orthopedics;  Laterality: Right;   CARDIAC CATHETERIZATION     COLONOSCOPY     EMBOLIZATION (CATH LAB) Bilateral 04/02/2023   Procedure: EMBOLIZATION;  Surgeon: Jama Cordella MATSU, MD;  Location: [facility] INVASIVE CV LAB;  Service: Cardiovascular;  Laterality: Bilateral;   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   EYE SURGERY Right 09/2013   Corneal transplant   JOINT REPLACEMENT Right 2015   knee   REVERSE SHOULDER ARTHROPLASTY Left 10/13/2020   Procedure: REVERSE SHOULDER ARTHROPLASTY;  Surgeon: Edie Norleen PARAS, MD;  Location: [facility] ORS;  Service: Orthopedics;  Laterality: Left;   SHOULDER ARTHROSCOPY WITH ROTATOR CUFF REPAIR Right 05/13/2018   Procedure: SHOULDER ARTHROSCOPY WITH DEBRIDEMENT, DECOMPRESSION AND ROTATOR CUFF REPAIR;  Surgeon: Edie Norleen PARAS, MD;  Location: [facility] ORS;  Service: Orthopedics;  Laterality: Right;   TONSILLECTOMY  reports that she has never smoked. She has never used smokeless tobacco. She reports that she does not drink alcohol  and does not use drugs.  Allergies[1]  Family History  Problem Relation Age of Onset   Breast cancer Paternal Aunt      Prior to Admission medications  Medication Sig Start Date End Date Taking? Authorizing Provider  amiodarone  (PACERONE ) 200 MG tablet Take 200 mg by mouth daily. 12/02/23  Yes [provider]  atorvastatin  (LIPITOR) 20 MG tablet Take 20 mg by mouth daily. 01/29/24 01/28/25 Yes [provider]   levothyroxine  (SYNTHROID ) 50 MCG tablet Take 50 mcg by mouth daily before breakfast. 04/08/24 04/08/25 Yes [provider]  tirzepatide (MOUNJARO) 2.5 MG/0.5ML Pen Inject 2.5 mg into the skin once a week. 04/08/24  Yes [provider]  ALPRAZolam  (XANAX ) 0.5 MG tablet Take 0.5 mg by mouth at bedtime as needed for anxiety or sleep.    [provider]  apixaban  (ELIQUIS ) 5 MG TABS tablet Take 1 tablet (5 mg total) by mouth 2 (two) times daily. 06/12/23   Patel, Sona, MD  benzonatate (TESSALON) 200 MG capsule Take by mouth. 04/22/23   [provider]  blood glucose meter kit and supplies KIT Dispense based on patient and insurance preference. Use up to four times daily as directed. (FOR ICD-9 250.00, 250.01). For QAC - HS accuchecks. 12/11/18   Singh, Prashant K, MD  Calcium  Carb-Cholecalciferol  (CALCIUM  600/VITAMIN D3 PO) Take 1 tablet by mouth daily.    [provider]  Coenzyme Q10 10 MG capsule Take 10 mg by mouth every morning.    [provider]  dapagliflozin  propanediol (FARXIGA ) 10 MG TABS tablet Take 10 mg by mouth daily. 11/03/20   [provider]  diltiazem  (CARDIZEM  CD) 180 MG 24 hr capsule Take 180 mg by mouth daily. 05/01/23   [provider]  feeding supplement, GLUCERNA SHAKE, (GLUCERNA SHAKE) LIQD Take 237 mLs by mouth 3 (three) times daily between meals. 04/08/23   Josette Ade, MD  fenofibrate  (TRICOR ) 48 MG tablet Take 48 mg by mouth daily.    [provider]  furosemide  (LASIX ) 40 MG tablet Take 1 tablet (40 mg total) by mouth daily. 06/12/23   Patel, Sona, MD  gabapentin  (NEURONTIN ) 100 MG capsule Take 200 mg by mouth 3 (three) times daily.    [provider]  glucose blood (FREESTYLE LITE) test strip For glucose testing every before meals at bedtime. Diagnosis E 11.65  Can substitute to any accepted brand 12/11/18   Singh, Prashant K, MD  insulin  lispro (HUMALOG) 100 UNIT/ML injection Inject  7-19 Units into the skin 3 (three) times daily before meals. Blood Glucose level: 140-199 - 7 units, 200-250 - 9 units, 251-299 - 13 units,  300-349 - 17 units,  350 or above 19 units.    [provider]  Insulin  Syringe-Needle U-100 25G X 1 1 ML MISC For 4 times a day insulin  SQ, 1 month supply. Diagnosis E11.65 12/11/18   Singh, Prashant K, MD  ipratropium (ATROVENT ) 0.06 % nasal spray Place 2 sprays into both nostrils 3 (three) times daily.    [provider]  LANTUS  SOLOSTAR 100 UNIT/ML Solostar Pen Inject 40 Units into the skin at bedtime. 06/12/23   Patel, Sona, MD  magnesium  oxide (MAG-OX) 400 (240 Mg) MG tablet Take 2 tablets by mouth 3 (three) times daily. 03/27/23   [provider]  metoprolol  tartrate (LOPRESSOR ) 50 MG tablet Take 1 tablet (50 mg  total) by mouth 2 (two) times daily. 03/12/23 06/05/23  Trudy Anthony HERO, MD  Multiple Vitamin (MULTIVITAMIN) tablet Take 1 tablet by mouth daily. Women's Daily Multivitamin    [provider]  omeprazole (PRILOSEC) 20 MG capsule Take 20 mg by mouth daily.    [provider]  PARoxetine  (PAXIL ) 10 MG tablet Take 10 mg by mouth daily. 02/22/23   [provider]  prednisoLONE  acetate (PRED FORTE ) 1 % ophthalmic suspension Place 1 drop into both eyes at bedtime. 10/04/17   [provider]  rOPINIRole  (REQUIP ) 1 MG tablet Take 1 mg by mouth at bedtime. 03/26/23   [provider]  sodium chloride  (OCEAN) 0.65 % SOLN nasal spray Place 2 sprays into both nostrils 4 (four) times daily. 04/08/23   Josette Ade, MD  spironolactone  (ALDACTONE ) 25 MG tablet Take 1 tablet (25 mg total) by mouth daily. 05/19/23   Laurita Pillion, MD  vitamin B-12 (CYANOCOBALAMIN ) 1000 MCG tablet Take 1,000 mcg by mouth daily.    [provider]    Physical Exam: Vitals:   05/04/24 0400 05/04/24 0500 05/04/24 0600 05/04/24 0829  BP: (!) 193/78 (!) 182/80 (!) 189/82   Pulse: 66 69 66   Resp: 19 19  20    Temp: 98.2 F (36.8 C)   98 F (36.7 C)  TempSrc: Oral     SpO2: 96% 95% 95%   Weight:      Height:        Constitutional: NAD, calm, comfortable Vitals:   05/04/24 0400 05/04/24 0500 05/04/24 0600 05/04/24 0829  BP: (!) 193/78 (!) 182/80 (!) 189/82   Pulse: 66 69 66   Resp: 19 19 20    Temp: 98.2 F (36.8 C)   98 F (36.7 C)  TempSrc: Oral     SpO2: 96% 95% 95%   Weight:      Height:       Eyes: PERRL, lids and conjunctivae normal ENMT: Mucous membranes are moist. Posterior pharynx clear of any exudate or lesions.Normal dentition.  Neck: normal, supple, no masses, no thyromegaly Respiratory: clear to auscultation bilaterally, no wheezing, fine crackles on bilateral lower fields, increasing breathing effort no accessory muscle use.  Cardiovascular: Regular rate and rhythm, no murmurs / rubs / gallops. No extremity edema. 2+ pedal pulses. No carotid bruits.  Abdomen: no tenderness, no masses palpated. No hepatosplenomegaly. Bowel sounds positive.  Musculoskeletal: no clubbing / cyanosis. No joint deformity upper and lower extremities. Good ROM, no contractures. Normal muscle tone.  Skin: no rashes, lesions, ulcers. No induration Neurologic: CN 2-12 grossly intact. Sensation intact, DTR normal. Strength 5/5 in all 4.  Psychiatric: Normal judgment and insight. Alert and oriented x 3. Normal mood.    Labs on Admission: I have personally reviewed following labs and imaging studies  CBC: Recent Labs  Lab 05/04/24 0342  WBC 6.7  NEUTROABS 3.5  HGB 15.0  HCT 46.0  MCV 103.1*  PLT 208   Basic Metabolic Panel: Recent Labs  Lab 05/04/24 0342  NA 142  K 3.7  CL 102  CO2 27  GLUCOSE 171*  BUN 19  CREATININE 1.11*  CALCIUM  9.7  MG 1.8   GFR: Estimated Creatinine Clearance: 38.4 mL/min (A) (by C-G formula based on SCr of 1.11 mg/dL (H)). Liver Function Tests: Recent Labs  Lab 05/04/24 0342  AST 33  ALT 16  ALKPHOS 64  BILITOT 0.6  PROT 7.1  ALBUMIN 4.2    No results for input(s): LIPASE, AMYLASE in the  last 168 hours. No results for input(s): AMMONIA in the last 168 hours. Coagulation Profile: No results for input(s): INR, PROTIME in the last 168 hours. Cardiac Enzymes: No results for input(s): CKTOTAL, CKMB, CKMBINDEX, TROPONINI in the last 168 hours. BNP (last 3 results) Recent Labs    05/04/24 0342  PROBNP 373.0*   HbA1C: No results for input(s): HGBA1C in the last 72 hours. CBG: No results for input(s): GLUCAP in the last 168 hours. Lipid Profile: No results for input(s): CHOL, HDL, LDLCALC, TRIG, CHOLHDL, LDLDIRECT in the last 72 hours. Thyroid Function Tests: No results for input(s): TSH, T4TOTAL, FREET4, T3FREE, THYROIDAB in the last 72 hours. Anemia Panel: No results for input(s): VITAMINB12, FOLATE, FERRITIN, TIBC, IRON, RETICCTPCT in the last 72 hours. Urine analysis:    Component Value Date/Time   COLORURINE YELLOW (A) 06/05/2023 1533   APPEARANCEUR CLEAR (A) 06/05/2023 1533   APPEARANCEUR Hazy 01/28/2012 0923   LABSPEC 1.008 06/05/2023 1533   LABSPEC 1.017 01/28/2012 0923   PHURINE 6.0 06/05/2023 1533   GLUCOSEU >=500 (A) 06/05/2023 1533   GLUCOSEU >=500 01/28/2012 0923   HGBUR MODERATE (A) 06/05/2023 1533   BILIRUBINUR NEGATIVE 06/05/2023 1533   KETONESUR NEGATIVE 06/05/2023 1533   PROTEINUR 30 (A) 06/05/2023 1533   NITRITE NEGATIVE 06/05/2023 1533   LEUKOCYTESUR NEGATIVE 06/05/2023 1533   LEUKOCYTESUR Trace 01/28/2012 0923    Radiological Exams on Admission: DG Chest Portable 1 View Result Date: 05/04/2024 EXAM: 1 VIEW(S) XRAY OF THE CHEST 05/04/2024 03:51:00 AM COMPARISON: 06/11/2023 CLINICAL HISTORY: SOB, hypoxic FINDINGS: LUNGS AND PLEURA: Interval increase of interstitial opacities. No pleural effusion. No pneumothorax. HEART AND MEDIASTINUM: Unchanged cardiomediastinal silhouette. Aortic calcification. BONES AND SOFT TISSUES: Reversed total left  shoulder arthroplasty. IMPRESSION: 1. Mild pulmonary edema. Electronically signed by: Morgane Naveau MD MD 05/04/2024 03:54 AM EST RP Workstation: [id number]    EKG: Independently reviewed.  Sinus rhythm, no acute ST changes.  Assessment/Plan Principal Problem:   Acute on chronic diastolic CHF (congestive heart failure) (HCC) Active Problems:   CHF (congestive heart failure) (HCC)  (please populate well all problems here in Problem List. (For example, if patient is on BP meds at home and you resume or decide to hold them, it is a problem that needs to be her. Same for CAD, COPD, HLD and so on)  Acute on chronic HFpEF decompensation HTN emergency - Acute CHF decompensation likely secondary to uncontrolled hypertension - Start patient on lisinopril , she has borderline bradycardia, we will hold off Cardizem  for now as she is in active CHF decompensated.  Will consider to use Coreg to replace Cardizem  since she is status post ablation. -Continue Aldactone  -Continue IV Lasix  40 mg twice daily x 2 doses and repeat chest x-ray tomorrow - Will not repeat echocardiogram as she has diagnosed diastolic CHF  PAF status post ablation - In sinus rhythm, continue amiodarone , hold off Cardizem  for borderline bradycardia and CHF decompensation - Continue Eliquis   IDDM - Cut down Lantus  dosage to 20 units daily - SSI  History of PE - Continue Eliquis   CKD stage III - Creatinine level stable, severe fluid overload, management as above   History of depression - Mentation at baseline, continue SSRI  Obesity - BMI= 32 - She is already on Ozempic   DVT prophylaxis: Eliquis  Code Status: DNR/DNI Family Communication: None at bedside Disposition Plan: Expect 1 to 2 days hospital stay, as patient requiring IV diuresis and monitor kidney function and clinical response Consults called: None Admission status: Telemetry admission  Cort ONEIDA Mana MD [facility] Pager [phone]  05/04/2024, 8:35  AM       [1]  Allergies Allergen Reactions   Buspirone     Other Reaction(s): Dizziness   Propofol  Anaphylaxis   Zolpidem Other (See Comments)    Hallucinations   Cholestyramine Itching and Rash   Codeine Nausea Only   Hydrochlorothiazide  Other (See Comments)    PATIENT DOES NOT REMEMBER   Loratadine  Other (See Comments)    PATIENT DOES NOT REMEMBER   Niacin Rash   "

## 2024-05-04 NOTE — Telephone Encounter (Signed)
 Pt notified of xray results. Pt states she got to feeling so bad, she went to ER this morning, sh got up to go to the bathroom and was breathing so bad and HR was up she called EMS. She is in the ER she had fluid in her lungs she states she has filled up to containers and almost a third one. She has been there for about 11 hours now she states.

## 2024-05-04 NOTE — ED Notes (Signed)
 CCMD called for cardiac monitoring.

## 2024-05-04 NOTE — ED Provider Notes (Signed)
 "  [facility] Provider Note    Event Date/Time   First MD Initiated Contact with Patient 05/04/24 [phone]     (approximate)   History   Shortness of Breath   HPI  Samantha Freeman is a 89 y.o. female who presents to the ED for evaluation of Shortness of Breath   I review a cardiology clinic visit from December.  Paroxysmal A-fib on Eliquis , HTN, HLD, DM.  Chronic exertional dyspnea  Patient presents to the ED due to worsening dyspnea on exertion over the past 12-24 hours.  Reports compliance with all of her medications but despite this has had worsening symptoms.  No dizziness, no chest pain  Physical Exam   Triage Vital Signs: ED Triage Vitals [05/04/24 0338]  Encounter Vitals Group     BP      Girls Systolic BP Percentile      Girls Diastolic BP Percentile      Boys Systolic BP Percentile      Boys Diastolic BP Percentile      Pulse      Resp      Temp      Temp src      SpO2      Weight      Height      Head Circumference      Peak Flow      Pain Score 0     Pain Loc      Pain Education      Exclude from Growth Chart     Most recent vital signs: Vitals:   05/04/24 0400 05/04/24 0500  BP: (!) 193/78 (!) 182/80  Pulse: 66 69  Resp: 19 19  Temp: 98.2 F (36.8 C)   SpO2: 96% 95%    General: Awake, no distress.  CV:  Good peripheral perfusion.  Resp:  Normal effort.  Abd:  No distention.  MSK:  No deformity noted.  Neuro:  No focal deficits appreciated. Other:     ED Results / Procedures / Treatments   Labs (all labs ordered are listed, but only abnormal results are displayed) Labs Reviewed  COMPREHENSIVE METABOLIC PANEL WITH GFR - Abnormal; Notable for the following components:      Result Value   Glucose, Bld 171 (*)    Creatinine, Ser 1.11 (*)    GFR, Estimated 48 (*)    All other components within normal limits  CBC WITH DIFFERENTIAL/PLATELET - Abnormal; Notable for the following components:   MCV 103.1 (*)    All  other components within normal limits  PRO BRAIN NATRIURETIC PEPTIDE - Abnormal; Notable for the following components:   Pro Brain Natriuretic Peptide 373.0 (*)    All other components within normal limits  TROPONIN T, HIGH SENSITIVITY - Abnormal; Notable for the following components:   Troponin T High Sensitivity 26 (*)    All other components within normal limits  RESP PANEL BY RT-PCR (RSV, FLU A&B, COVID)  [id number]  MAGNESIUM   TROPONIN T, HIGH SENSITIVITY    EKG Poor quality EKG, rate of 75 bpm, first-degree AV block without high-grade block.  No STEMI.  RADIOLOGY 1 view CXR interpreted by me with pulmonary vascular congestion  Official radiology report(s): DG Chest Portable 1 View Result Date: 05/04/2024 EXAM: 1 VIEW(S) XRAY OF THE CHEST 05/04/2024 03:51:00 AM COMPARISON: 06/11/2023 CLINICAL HISTORY: SOB, hypoxic FINDINGS: LUNGS AND PLEURA: Interval increase of interstitial opacities. No pleural effusion. No pneumothorax. HEART AND MEDIASTINUM: Unchanged cardiomediastinal silhouette. Aortic calcification. BONES  AND SOFT TISSUES: Reversed total left shoulder arthroplasty. IMPRESSION: 1. Mild pulmonary edema. Electronically signed by: Morgane Naveau MD MD 05/04/2024 03:54 AM EST RP Workstation: [id number]    PROCEDURES and INTERVENTIONS:  .Critical Care  Performed by: Claudene Rover, MD Authorized by: Claudene Rover, MD   Critical care provider statement:    Critical care time (minutes):  30   Critical care time was exclusive of:  Separately billable procedures and treating other patients   Critical care was necessary to treat or prevent imminent or life-threatening deterioration of the following conditions:  Respiratory failure   Critical care was time spent personally by me on the following activities:  Development of treatment plan with patient or surrogate, discussions with consultants, evaluation of patient's response to treatment, examination of patient, ordering and review of  laboratory studies, ordering and review of radiographic studies, ordering and performing treatments and interventions, pulse oximetry, re-evaluation of patient's condition and review of old charts .1-3 Lead EKG Interpretation  Performed by: Claudene Rover, MD Authorized by: Claudene Rover, MD     Interpretation: normal     ECG rate:  72   ECG rate assessment: normal     Rhythm: sinus rhythm     Ectopy: none     Conduction: normal     Medications  furosemide  (LASIX ) injection 60 mg (60 mg Intravenous Given 05/04/24 0533)     IMPRESSION / MDM / ASSESSMENT AND PLAN / ED COURSE  I reviewed the triage vital signs and the nursing notes.  Differential diagnosis includes, but is not limited to, ACS, PTX, PNA, muscle strain/spasm, PE, dissection, anxiety, pleural effusion  {Patient presents with symptoms of an acute illness or injury that is potentially life-threatening.  Patient with history of diastolic CHF presents with worsening dyspnea on exertion, hypoxia and signs of volume overload requiring IV diuresis and medical admission.  Nonischemic EKG, relatively low troponin that we will trend.  Normal CBC and electrolytes.   Clinical Course as of 05/04/24 0553  Mon May 04, 2024  0532 Reassessed and discussed plan of care.  I turned off her supplemental oxygen while she was seated and during our conversation she desaturated to the upper 80s without any exertion.  Return to 2 L O2.  I recommend medical admission for IV diuresis and she is agreeable [DS]  0533 I consulted medicine who agrees to admit [DS]    Clinical Course User Index [DS] Claudene Rover, MD     FINAL CLINICAL IMPRESSION(S) / ED DIAGNOSES   Final diagnoses:  Acute on chronic diastolic congestive heart failure (HCC)  Hypoxia     Rx / DC Orders   ED Discharge Orders     None        Note:  This document was prepared using Dragon voice recognition software and may include unintentional dictation errors.   Claudene Rover, MD 05/04/24 [phone]  "

## 2024-05-04 NOTE — Progress Notes (Signed)
 Heart Failure Navigator Progress Note Assessed for Heart & Vascular TOC clinic readiness.  Patient is currently a [facility] Cardiology patient.  Navigator will sign off at this time.  Charmaine Pines, RN, BSN [facility] Heart Failure Navigator Secure Chat Only

## 2024-05-05 ENCOUNTER — Inpatient Hospital Stay

## 2024-05-05 ENCOUNTER — Telehealth (HOSPITAL_COMMUNITY): Payer: Self-pay

## 2024-05-05 ENCOUNTER — Other Ambulatory Visit (HOSPITAL_COMMUNITY): Payer: Self-pay

## 2024-05-05 LAB — CBG MONITORING, ED
Glucose-Capillary: 190 mg/dL — ABNORMAL HIGH (ref 70–99)
Glucose-Capillary: 232 mg/dL — ABNORMAL HIGH (ref 70–99)
Glucose-Capillary: 211 mg/dL — ABNORMAL HIGH (ref 70–99)

## 2024-05-05 LAB — BASIC METABOLIC PANEL WITH GFR
Anion gap: 14 (ref 5–15)
BUN: 20 mg/dL (ref 8–23)
CO2: 31 mmol/L (ref 22–32)
Calcium: 8.9 mg/dL (ref 8.9–10.3)
Chloride: 96 mmol/L — ABNORMAL LOW (ref 98–111)
Creatinine, Ser: 1.11 mg/dL — ABNORMAL HIGH (ref 0.44–1.00)
GFR, Estimated: 48 mL/min — ABNORMAL LOW
Glucose, Bld: 154 mg/dL — ABNORMAL HIGH (ref 70–99)
Potassium: 3.3 mmol/L — ABNORMAL LOW (ref 3.5–5.1)
Sodium: 140 mmol/L (ref 135–145)

## 2024-05-05 LAB — GLUCOSE, CAPILLARY
Glucose-Capillary: 157 mg/dL — ABNORMAL HIGH (ref 70–99)
Glucose-Capillary: 210 mg/dL — ABNORMAL HIGH (ref 70–99)

## 2024-05-05 LAB — HEMOGLOBIN A1C
Hgb A1c MFr Bld: 6.9 % — ABNORMAL HIGH (ref 4.8–5.6)
Mean Plasma Glucose: 151.33 mg/dL

## 2024-05-05 MED ORDER — DICLOFENAC SODIUM 1 % EX GEL
2.0000 g | Freq: Four times a day (QID) | CUTANEOUS | Status: DC
  Administered 2024-05-05 – 2024-05-07 (×7): 2 g via TOPICAL
  Filled 2024-05-05: qty 100

## 2024-05-05 MED ORDER — POTASSIUM CHLORIDE CRYS ER 20 MEQ PO TBCR
40.0000 meq | EXTENDED_RELEASE_TABLET | Freq: Once | ORAL | Status: AC
  Administered 2024-05-05: 40 meq via ORAL
  Filled 2024-05-05: qty 2

## 2024-05-05 MED ORDER — PREDNISOLONE ACETATE 1 % OP SUSP
1.0000 [drp] | Freq: Every day | OPHTHALMIC | Status: DC
  Administered 2024-05-05 – 2024-05-06 (×2): 1 [drp] via OPHTHALMIC
  Filled 2024-05-05: qty 1

## 2024-05-05 MED ORDER — FUROSEMIDE 10 MG/ML IJ SOLN
40.0000 mg | Freq: Every day | INTRAMUSCULAR | Status: DC
  Administered 2024-05-06 – 2024-05-07 (×2): 40 mg via INTRAVENOUS
  Filled 2024-05-05 (×2): qty 4

## 2024-05-05 NOTE — ED Notes (Signed)
Pt up in recliner

## 2024-05-05 NOTE — TOC CM/SW Note (Signed)
 Transition of Care [facility]) - Inpatient Brief Assessment   Patient Details  Name: Samantha Freeman MRN: [id number] Date of Birth: [birth year]  [facility] of [facility]) CM/SW Contact:    Lauraine JAYSON Carpen, LCSW Phone Number: 05/05/2024, 12:15 PM   Clinical Narrative: CSW reviewed chart. Patient is currently on acute oxygen. Will follow for this potential discharge need. No other TOC needs so far. Please place [facility] consult if any needs arise.  Transition of Care Asessment: Insurance and Status: Insurance coverage has been reviewed Patient has primary care physician: Yes Home environment has been reviewed: Apartment Prior level of function:: Not documented Prior/Current Home Services: No current home services Social Drivers of Health Review: SDOH reviewed no interventions necessary Readmission risk has been reviewed: Yes Transition of care needs: transition of care needs identified, TOC will continue to follow

## 2024-05-05 NOTE — Progress Notes (Signed)
 " PROGRESS NOTE   HPI was taken from Dr. Laurita: Samantha Freeman is a 89 y.o. female with medical history significant of PAF and PE on Eliquis , HTN, chronic HFpEF, CKD stage IIIa, IDDM, HLD, IBS, anxiety/depression, presented with worsening of shortness of breath.   Patient reported that she underwent ablation for A-fib/a flutter in November last year, and at some point afterwards, her metoprolol  was discontinued and she could not tell for what reason.  She checked her blood pressure occasionally and found her BP has been running high with SBP in the range of 170-180 last week and increasingly she is been feeling more shortness of breath she has had some dry cough and feeling  pressure-like sensation in the chest but denied any chest pain no fever or chills.  She reported recent weight gain but no significant ankle swelling.   ED Course: Afebrile, no tachycardia blood pressure 190/80 O2 saturation 95% on 4 L.  Chest x-ray showed moderate pulmonary congestion, blood work showed BUN 19 creatinine 1.1 WBC 6.7 hemoglobin 15.  Troponin 26> 41, proBNP 373, EKG normal sinus rhythm, no acute ST changes.   Patient was given IV Lasix  40 mg x 1 and pulled out 600 mL of urine and started to feel better.      Samantha Freeman  FMW:[id number] DOB: 11-29-1935 DOA: 05/04/2024 PCP: Auston Reyes BIRCH, MD   Assessment & Plan:   Principal Problem:   Acute on chronic diastolic CHF (congestive heart failure) (HCC) Active Problems:   CHF (congestive heart failure) (HCC)  Assessment and Plan:  Acute on chronic diastolic CHF: continue on IV lasix . Monitor I/Os. Continue on lisinopril , aldactone .   HTN: continue on lisinopril , aldactone  but pt has not been taking aldactone  at home b/c she was told to hold the medication but unsure of why   HTN emergency: emergency resolved but still w/ HTN.   PAF: s/p ablation. Continue on amio, eliquis . Holding home dose of cardizem  for borderline bradycardia    DM2: well  controlled, HbA1c 6.9. Continue on glargine, SSI w/ accuchecks    Hx of PE: continue on eliquis     CKDIIIa: Cr is stable from day prior. Avoid nephrotoxic meds    Depression: severity unknown. Continue on home dose of paroxetine    Obesity: BMI 32.2. Would benefit from weight loss    DVT prophylaxis: eliquis  Code Status: DNR Family Communication: Disposition Plan: likely d/c back home   Level of care: Telemetry  Status is: Inpatient Remains inpatient appropriate because: severity of illness    Consultants:    Procedures:   Antimicrobials:    Subjective: Pt c/o shortness of breath   Objective: Vitals:   05/04/24 1216 05/04/24 2018 05/05/24 0051 05/05/24 0428  BP:  (!) 166/82 135/63 (!) 148/66  Pulse:  66 (!) 58 60  Resp:  19 20 18   Temp: 98.2 F (36.8 C) 97.6 F (36.4 C) 97.6 F (36.4 C) 97.8 F (36.6 C)  TempSrc: Oral Oral Oral Oral  SpO2:  97% 95% 94%  Weight:      Height:        Intake/Output Summary (Last 24 hours) at 05/05/2024 0941 Last data filed at 05/04/2024 2217 Gross per 24 hour  Intake 3 ml  Output 800 ml  Net -797 ml   Filed Weights   05/04/24 0341  Weight: 85.3 kg    Examination:  General exam: Appears calm and comfortable  Respiratory system: decreased breath sounds b/l  Cardiovascular system: S1 & S2+. No rubs, gallops  or clicks. Gastrointestinal system: Abdomen is obese, soft and nontender. Normal bowel sounds heard. Central nervous system: Alert and oriented. Moves all extremities  Psychiatry: Judgement and insight appear normal. Mood & affect appropriate.     Data Reviewed: I have personally reviewed following labs and imaging studies  CBC: Recent Labs  Lab 05/04/24 0342  WBC 6.7  NEUTROABS 3.5  HGB 15.0  HCT 46.0  MCV 103.1*  PLT 208   Basic Metabolic Panel: Recent Labs  Lab 05/04/24 0342 05/05/24 0335  NA 142 140  K 3.7 3.3*  CL 102 96*  CO2 27 31  GLUCOSE 171* 154*  BUN 19 20  CREATININE 1.11* 1.11*   CALCIUM  9.7 8.9  MG 1.8  --    GFR: Estimated Creatinine Clearance: 38.4 mL/min (A) (by C-G formula based on SCr of 1.11 mg/dL (H)). Liver Function Tests: Recent Labs  Lab 05/04/24 0342  AST 33  ALT 16  ALKPHOS 64  BILITOT 0.6  PROT 7.1  ALBUMIN 4.2   No results for input(s): LIPASE, AMYLASE in the last 168 hours. No results for input(s): AMMONIA in the last 168 hours. Coagulation Profile: No results for input(s): INR, PROTIME in the last 168 hours. Cardiac Enzymes: No results for input(s): CKTOTAL, CKMB, CKMBINDEX, TROPONINI in the last 168 hours. BNP (last 3 results) Recent Labs    05/04/24 0342  PROBNP 373.0*   HbA1C: Recent Labs    05/04/24 0342  HGBA1C 6.9*   CBG: Recent Labs  Lab 05/04/24 1016 05/04/24 1147 05/04/24 1730 05/04/24 2105 05/05/24 0732  GLUCAP 279* 219* 145* 161* 190*   Lipid Profile: No results for input(s): CHOL, HDL, LDLCALC, TRIG, CHOLHDL, LDLDIRECT in the last 72 hours. Thyroid Function Tests: No results for input(s): TSH, T4TOTAL, FREET4, T3FREE, THYROIDAB in the last 72 hours. Anemia Panel: No results for input(s): VITAMINB12, FOLATE, FERRITIN, TIBC, IRON, RETICCTPCT in the last 72 hours. Sepsis Labs: No results for input(s): PROCALCITON, LATICACIDVEN in the last 168 hours.  Recent Results (from the past 240 hours)  Resp panel by RT-PCR (RSV, Flu A&B, Covid) Anterior Nasal Swab     Status: None   Collection Time: 05/04/24  3:35 AM   Specimen: Anterior Nasal Swab  Result Value Ref Range Status   SARS Coronavirus 2 by RT PCR NEGATIVE NEGATIVE Final    Comment: (NOTE) SARS-CoV-2 target nucleic acids are NOT DETECTED.  The SARS-CoV-2 RNA is generally detectable in upper respiratory specimens during the acute phase of infection. The lowest concentration of SARS-CoV-2 viral copies this assay can detect is 138 copies/mL. A negative result does not preclude  SARS-Cov-2 infection and should not be used as the sole basis for treatment or other patient management decisions. A negative result may occur with  improper specimen collection/handling, submission of specimen other than nasopharyngeal swab, presence of viral mutation(s) within the areas targeted by this assay, and inadequate number of viral copies(<138 copies/mL). A negative result must be combined with clinical observations, patient history, and epidemiological information. The expected result is Negative.  Fact Sheet for Patients:  bloggercourse.com  Fact Sheet for Healthcare Providers:  seriousbroker.it  This test is no t yet approved or cleared by the [place]  FDA and  has been authorized for detection and/or diagnosis of SARS-CoV-2 by FDA under an Emergency Use Authorization (EUA). This EUA will remain  in effect (meaning this test can be used) for the duration of the COVID-19 declaration under Section 564(b)(1) of the Act, 21 U.S.C.section 360bbb-3(b)(1), unless the  authorization is terminated  or revoked sooner.       Influenza A by PCR NEGATIVE NEGATIVE Final   Influenza B by PCR NEGATIVE NEGATIVE Final    Comment: (NOTE) The Xpert Xpress SARS-CoV-2/FLU/RSV plus assay is intended as an aid in the diagnosis of influenza from Nasopharyngeal swab specimens and should not be used as a sole basis for treatment. Nasal washings and aspirates are unacceptable for Xpert Xpress SARS-CoV-2/FLU/RSV testing.  Fact Sheet for Patients: bloggercourse.com  Fact Sheet for Healthcare Providers: seriousbroker.it  This test is not yet approved or cleared by the [place]  FDA and has been authorized for detection and/or diagnosis of SARS-CoV-2 by FDA under an Emergency Use Authorization (EUA). This EUA will remain in effect (meaning this test can be used) for the duration of  the COVID-19 declaration under Section 564(b)(1) of the Act, 21 U.S.C. section 360bbb-3(b)(1), unless the authorization is terminated or revoked.     Resp Syncytial Virus by PCR NEGATIVE NEGATIVE Final    Comment: (NOTE) Fact Sheet for Patients: bloggercourse.com  Fact Sheet for Healthcare Providers: seriousbroker.it  This test is not yet approved or cleared by the [place]  FDA and has been authorized for detection and/or diagnosis of SARS-CoV-2 by FDA under an Emergency Use Authorization (EUA). This EUA will remain in effect (meaning this test can be used) for the duration of the COVID-19 declaration under Section 564(b)(1) of the Act, 21 U.S.C. section 360bbb-3(b)(1), unless the authorization is terminated or revoked.  Performed at [facility], [address] [place], [place] [address]          Radiology Studies: DG Chest 1 View Result Date: 05/05/2024 EXAM: 1 VIEW(S) XRAY OF THE CHEST 05/05/2024 07:03:00 AM COMPARISON: 05/04/2024 CLINICAL HISTORY: The patient has congestive heart failure (CHF). ICD10: 02706 - Congestive heart failure. FINDINGS: LUNGS AND PLEURA: Streaky opacities at left lung base, possibly representing atelectasis or pneumonia. Decreased mild pulmonary edema. Possible small left pleural effusion. No pneumothorax. HEART AND MEDIASTINUM: Stable cardiomegaly. Aortic atherosclerosis. BONES AND SOFT TISSUES: Partially visualized left shoulder arthroplasty hardware. No acute osseous abnormality. IMPRESSION: 1. Decreased mild pulmonary edema. 2. Possible small left pleural effusion. 3. Stable cardiomegaly. 4. Streaky opacities at the left lung base, possibly representing atelectasis or pneumonia. Electronically signed by: Waddell Calk MD MD 05/05/2024 07:21 AM EST RP Workstation: [id number]   DG Chest Portable 1 View Result Date: 05/04/2024 EXAM: 1 VIEW(S) XRAY OF THE CHEST 05/04/2024 03:51:00 AM  COMPARISON: 06/11/2023 CLINICAL HISTORY: SOB, hypoxic FINDINGS: LUNGS AND PLEURA: Interval increase of interstitial opacities. No pleural effusion. No pneumothorax. HEART AND MEDIASTINUM: Unchanged cardiomediastinal silhouette. Aortic calcification. BONES AND SOFT TISSUES: Reversed total left shoulder arthroplasty. IMPRESSION: 1. Mild pulmonary edema. Electronically signed by: Morgane Naveau MD MD 05/04/2024 03:54 AM EST RP Workstation: [id number]        Scheduled Meds:  amiodarone   200 mg Oral Daily   apixaban   5 mg Oral BID   atorvastatin   20 mg Oral Daily   dapagliflozin  propanediol  10 mg Oral Daily   fenofibrate   54 mg Oral Daily   insulin  aspart  0-15 Units Subcutaneous TID WC   insulin  aspart  0-5 Units Subcutaneous QHS   insulin  glargine-yfgn  20 Units Subcutaneous Daily   levothyroxine   50 mcg Oral QAC breakfast   lisinopril   20 mg Oral Daily   pantoprazole   40 mg Oral Daily   PARoxetine   10 mg Oral Daily   rOPINIRole   1 mg Oral TID   sodium  chloride flush  3 mL Intravenous Q12H   spironolactone   25 mg Oral Daily   Continuous Infusions:   LOS: 1 day       Anthony CHRISTELLA Pouch, MD [facility] Pager [phone]  If 7PM-7AM, please contact night-coverage [email or web address] 05/05/2024, 9:41 AM   "

## 2024-05-05 NOTE — Progress Notes (Signed)
 Heart Failure Stewardship Pharmacy Note  PCP: Auston Reyes BIRCH, MD PCP-Cardiologist: None  HPI: Samantha Freeman is a 89 y.o. female with recent pulmonary embolism on Eliquis  with epistaxis requiring embolization, atrial fibrillation on Eliquis , CHF, type 2 diabetes, hypertension, hyperlipidemia, CKD stage IIIa, depression/anxiety who presented with fatigue and shortness of breath. On admission, proBNP was 373, HS-troponin was 26, and A1c 6.9. Chest x-ray noted mild pulmonary edema.  Pertinent cardiac history: LVEF in 01/2023 was 60-65% with indeterminate diastolic dysfunction. TTE 05/2023 noted LVEF of 70-75% with G3DD and IVS of 1.3 cm.  CMRI performed 07/2023 and noted LVEF of 55% and normal ECV with no evidence of infiltrative or inflammatory disease.   Pertinent Lab Values: Creatinine  Date Value Ref Range Status  02/15/2012 0.87 0.60 - 1.30 mg/dL Final   Creatinine, Ser  Date Value Ref Range Status  05/05/2024 1.11 (H) 0.44 - 1.00 mg/dL Final   BUN  Date Value Ref Range Status  05/05/2024 20 8 - 23 mg/dL Final  89/74/7986 14 7 - 18 mg/dL Final   Potassium  Date Value Ref Range Status  05/05/2024 3.3 (L) 3.5 - 5.1 mmol/L Final    Comment:    HEMOLYSIS AT THIS LEVEL MAY AFFECT RESULT  02/15/2012 3.7 3.5 - 5.1 mmol/L Final   Sodium  Date Value Ref Range Status  05/05/2024 140 135 - 145 mmol/L Final  02/15/2012 140 136 - 145 mmol/L Final   B Natriuretic Peptide  Date Value Ref Range Status  06/05/2023 593.4 (H) 0.0 - 100.0 pg/mL Final    Comment:    Performed at [facility], [address], [place], [place] [address]   Magnesium   Date Value Ref Range Status  05/04/2024 1.8 1.7 - 2.4 mg/dL Final    Comment:    Performed at [facility], [address] [place], [place] [address]   Hgb A1c MFr Bld  Date Value Ref Range Status  05/04/2024 6.9 (H) 4.8 - 5.6 % Final    Comment:    (NOTE) Diagnosis of Diabetes The following HbA1c ranges  recommended by the American Diabetes Association (ADA) may be used as an aid in the diagnosis of diabetes mellitus.  Hemoglobin             Suggested A1C NGSP%              Diagnosis  <5.7                   Non Diabetic  5.7-6.4                Pre-Diabetic  >6.4                   Diabetic  <7.0                   Glycemic control for                       adults with diabetes.     Digoxin  Level  Date Value Ref Range Status  05/16/2023 0.5 (L) 0.8 - 2.0 ng/mL Final    Comment:    Performed at [facility], [address] [place], [place] [address]   TSH  Date Value Ref Range Status  01/30/2023 4.844 (H) 0.350 - 4.500 uIU/mL Final    Comment:    Performed by a 3rd Generation assay with a functional sensitivity of <=0.01 uIU/mL. Performed at [facility],  [address] [place], [place] [address]    LDH  Date Value Ref Range Status  12/11/2018 239 (H) 98 - 192 U/L Final    Comment:    Performed at [facility], [address] [place], [place] [address]   Vital Signs: Temp:  [97.6 F (36.4 C)-98.2 F (36.8 C)] 97.8 F (36.6 C) (01/13 0428) Pulse Rate:  [58-69] 60 (01/13 0428) Cardiac Rhythm: Sinus bradycardia (01/13 0228) Resp:  [18-20] 18 (01/13 0428) BP: (135-170)/(63-82) 148/66 (01/13 0428) SpO2:  [94 %-98 %] 94 % (01/13 0428)  Intake/Output Summary (Last 24 hours) at 05/05/2024 0820 Last data filed at 05/04/2024 2217 Gross per 24 hour  Intake 3 ml  Output 800 ml  Net -797 ml    Current Heart Failure Medications:  Loop diuretic: furosemide  40 mg IV daily Beta-Blocker: none ACEI/ARB/ARNI: none MRA: spironolactone  25 mg daily SGLT2i: Farxiga  10 mg daily Other:  Prior to admission Heart Failure Medications:  Loop diuretic: furosemide  40 mg daily Beta-Blocker: metoprolol  tartrate 50 mg BID ACEI/ARB/ARNI: none MRA: spironolactone  25 mg daily SGLT2i: Farxiga  10 mg daily Other: diltiazem  ER 360 mg daily (not  taking), digoxin  0.125 mg daily   Assessment: 1. Acute on chronic diastolic heart failure (LVEF 55%)  , due to presumed NICM. NYHA class III-IV symptoms.  -Symptoms: Patient reports shortness of breath, LEE, orthopnea, and appetite are much improved, but not quite to baseline. -Volume: Still appears to be somewhat hypervolemic on exam, though beginning to develop contraction alkalosis. Creatinine is stable with diuresis, I/Os not accurate. Continue furosemide  40 mg IV today, can re-evaluate tomorrow. -Hemodynamics: BP is elevated. HR 60s. -SGLT2i: Continue Farxiga  10 mg daily. -MRA: Continue spironolactone  25 mg daily well. K is low. -ARNI: Consider transitioning form lisinopril  to Entresto  24-26 mg BID to start tomorrow night at 2200.  Plan: 1) Medication changes recommended at this time: -Consider transitioning form lisinopril  to Entresto  24-26 mg BID to start tomorrow night at 2200.  2) Patient assistance: -Pending  3) Education: - Patient has been educated on current HF medications and potential additions to HF medication regimen - Patient verbalizes understanding that over the next few months, these medication doses may change and more medications may be added to optimize HF regimen - Patient has been educated on basic disease state pathophysiology and goals of therapy  Please do not hesitate to reach out with questions or concerns,  Jaun Bash, PharmD, CPP, BCPS, [facility] Heart Failure Pharmacist  Phone - [phone] 05/05/2024 12:00 PM

## 2024-05-05 NOTE — Telephone Encounter (Signed)
 Pharmacy Patient Advocate Encounter  Insurance verification completed.    The patient is insured through [facility]. Patient has Medicare and is not eligible for a copay card, but may be able to apply for patient assistance or Medicare RX Payment Plan (Patient Must reach out to their plan, if eligible for payment plan), if available.    Ran test claim for Entresto  24-26mg  tablet and the current 30 day co-pay is $58.39.   This test claim was processed through [facility]- copay amounts may vary at other pharmacies due to pharmacy/plan contracts, or as the patient moves through the different stages of their insurance plan.

## 2024-05-05 NOTE — ED Notes (Signed)
 Assumed care of pt from Zoe RN. Introduced self to pt. Pt up to recliner at this time. Pt has no needs or requests. Chair locked and call bell in reach.

## 2024-05-06 DIAGNOSIS — I5033 Acute on chronic diastolic (congestive) heart failure: Secondary | ICD-10-CM

## 2024-05-06 DIAGNOSIS — I10 Essential (primary) hypertension: Secondary | ICD-10-CM

## 2024-05-06 DIAGNOSIS — J9621 Acute and chronic respiratory failure with hypoxia: Secondary | ICD-10-CM

## 2024-05-06 DIAGNOSIS — E039 Hypothyroidism, unspecified: Secondary | ICD-10-CM

## 2024-05-06 LAB — BASIC METABOLIC PANEL WITH GFR
Anion gap: 12 (ref 5–15)
BUN: 30 mg/dL — ABNORMAL HIGH (ref 8–23)
CO2: 32 mmol/L (ref 22–32)
Calcium: 9.8 mg/dL (ref 8.9–10.3)
Chloride: 97 mmol/L — ABNORMAL LOW (ref 98–111)
Creatinine, Ser: 1.25 mg/dL — ABNORMAL HIGH (ref 0.44–1.00)
GFR, Estimated: 42 mL/min — ABNORMAL LOW
Glucose, Bld: 166 mg/dL — ABNORMAL HIGH (ref 70–99)
Potassium: 3.5 mmol/L (ref 3.5–5.1)
Sodium: 142 mmol/L (ref 135–145)

## 2024-05-06 LAB — GLUCOSE, CAPILLARY
Glucose-Capillary: 224 mg/dL — ABNORMAL HIGH (ref 70–99)
Glucose-Capillary: 207 mg/dL — ABNORMAL HIGH (ref 70–99)
Glucose-Capillary: 197 mg/dL — ABNORMAL HIGH (ref 70–99)
Glucose-Capillary: 219 mg/dL — ABNORMAL HIGH (ref 70–99)

## 2024-05-06 MED ORDER — INSULIN ASPART 100 UNIT/ML IJ SOLN
3.0000 [IU] | Freq: Three times a day (TID) | INTRAMUSCULAR | Status: DC
  Administered 2024-05-06 – 2024-05-07 (×2): 3 [IU] via SUBCUTANEOUS
  Filled 2024-05-06 (×2): qty 3

## 2024-05-06 NOTE — Inpatient Diabetes Management (Signed)
 Inpatient Diabetes Program Recommendations  AACE/ADA: New Consensus Statement on Inpatient Glycemic Control  Target Ranges:  Prepandial:   less than 140 mg/dL      Peak postprandial:   less than 180 mg/dL (1-2 hours)      Critically ill patients:  140 - 180 mg/dL    Latest Reference Range & Units 05/05/24 07:32 05/05/24 11:32 05/05/24 12:30 05/05/24 17:40 05/05/24 21:26 05/06/24 09:17  Glucose-Capillary 70 - 99 mg/dL 809 (H) 767 (H) 788 (H) 157 (H) 210 (H) 224 (H)   Review of Glycemic Control  Diabetes history: DM2 Outpatient Diabetes medications: Humalog 10-30 units TID with meals, Lantus  40 units at bedtime, Mounjaro 2.5 mg Qweek Current orders for Inpatient glycemic control: Semglee  20 units daily, Novolog  0-15 units TID with meals, Novolog  0-5 units at bedtime, Farxiga  10 mg daily  Inpatient Diabetes Program Recommendations:    Insulin : Please consider ordering Novolog  3 units TID with meals for meal coverage if patient eats at least 50% of meals.   Thanks, Earnie Gainer, RN, MSN, CDCES Diabetes Coordinator Inpatient Diabetes Program [phone] (Team Pager from 8am to 5pm)

## 2024-05-06 NOTE — Plan of Care (Signed)

## 2024-05-06 NOTE — Progress Notes (Signed)
 Heart Failure Stewardship Pharmacy Note  PCP: Auston Reyes BIRCH, MD PCP-Cardiologist: None  HPI: Samantha Freeman is a 89 y.o. female with recent pulmonary embolism on Eliquis  with epistaxis requiring embolization, atrial fibrillation on Eliquis , CHF, type 2 diabetes, hypertension, hyperlipidemia, CKD stage IIIa, depression/anxiety who presented with fatigue and shortness of breath. On admission, proBNP was 373, HS-troponin was 26, and A1c 6.9. Chest x-ray noted mild pulmonary edema.  Pertinent cardiac history: LVEF in 01/2023 was 60-65% with indeterminate diastolic dysfunction. TTE 05/2023 noted LVEF of 70-75% with G3DD and IVS of 1.3 cm.  CMRI performed 07/2023 and noted LVEF of 55% and normal ECV with no evidence of infiltrative or inflammatory disease.   Pertinent Lab Values: Creatinine  Date Value Ref Range Status  02/15/2012 0.87 0.60 - 1.30 mg/dL Final   Creatinine, Ser  Date Value Ref Range Status  05/06/2024 1.25 (H) 0.44 - 1.00 mg/dL Final   BUN  Date Value Ref Range Status  05/06/2024 30 (H) 8 - 23 mg/dL Final  89/74/7986 14 7 - 18 mg/dL Final   Potassium  Date Value Ref Range Status  05/06/2024 3.5 3.5 - 5.1 mmol/L Final  02/15/2012 3.7 3.5 - 5.1 mmol/L Final   Sodium  Date Value Ref Range Status  05/06/2024 142 135 - 145 mmol/L Final  02/15/2012 140 136 - 145 mmol/L Final   B Natriuretic Peptide  Date Value Ref Range Status  06/05/2023 593.4 (H) 0.0 - 100.0 pg/mL Final    Comment:    Performed at [facility], [address], [place], [place] [address]   Magnesium   Date Value Ref Range Status  05/04/2024 1.8 1.7 - 2.4 mg/dL Final    Comment:    Performed at [facility], [address] [place], [place] [address]   Hgb A1c MFr Bld  Date Value Ref Range Status  05/04/2024 6.9 (H) 4.8 - 5.6 % Final    Comment:    (NOTE) Diagnosis of Diabetes The following HbA1c ranges recommended by the American Diabetes Association (ADA) may be used  as an aid in the diagnosis of diabetes mellitus.  Hemoglobin             Suggested A1C NGSP%              Diagnosis  <5.7                   Non Diabetic  5.7-6.4                Pre-Diabetic  >6.4                   Diabetic  <7.0                   Glycemic control for                       adults with diabetes.     Digoxin  Level  Date Value Ref Range Status  05/16/2023 0.5 (L) 0.8 - 2.0 ng/mL Final    Comment:    Performed at [facility], [address] [place], [place] [address]   TSH  Date Value Ref Range Status  01/30/2023 4.844 (H) 0.350 - 4.500 uIU/mL Final    Comment:    Performed by a 3rd Generation assay with a functional sensitivity of <=0.01 uIU/mL. Performed at [facility], [address], [place], [place] [address]    LDH  Date Value  Ref Range Status  12/11/2018 239 (H) 98 - 192 U/L Final    Comment:    Performed at [facility], [address], [place], [place] [address]   Vital Signs: Temp:  [97.7 F (36.5 C)-98.7 F (37.1 C)] 98.1 F (36.7 C) (01/14 0915) Pulse Rate:  [63-74] 72 (01/14 0915) Cardiac Rhythm: Normal sinus rhythm;Heart block (01/14 0700) Resp:  [17-23] 17 (01/14 0915) BP: (126-178)/(56-83) 137/56 (01/14 0915) SpO2:  [92 %-97 %] 96 % (01/14 0915) Weight:  [87 kg (191 lb 12.8 oz)] 87 kg (191 lb 12.8 oz) (01/14 0527)  Intake/Output Summary (Last 24 hours) at 05/06/2024 1008 Last data filed at 05/06/2024 0557 Gross per 24 hour  Intake 580 ml  Output 2350 ml  Net -1770 ml    Current Heart Failure Medications:  Loop diuretic: furosemide  40 mg IV daily Beta-Blocker: none ACEI/ARB/ARNI: lisinopril  20 mg daily MRA: spironolactone  25 mg daily SGLT2i: Farxiga  10 mg daily Other:  Prior to admission Heart Failure Medications:  Loop diuretic: furosemide  40 mg daily Beta-Blocker: metoprolol  tartrate 50 mg BID ACEI/ARB/ARNI: none MRA: spironolactone  25 mg daily SGLT2i: Farxiga  10 mg daily Other:  diltiazem  ER 360 mg daily (not taking), digoxin  0.125 mg daily   Assessment: 1. Acute on chronic diastolic heart failure (LVEF 55%)  , due to presumed NICM. NYHA class III-IV symptoms.  -Symptoms: Patient reports shortness of breath, LEE, orthopnea, and appetite are much improved, but not quite to baseline. -Volume: Appears to be euvolemic, beginning to develop contraction alkalosis. Creatinine is trending up, I/Os not accurate. Consider transition to oral furosemide  40 mg daily with additional prn tomorrow. -Hemodynamics: BP is elevated. HR 60s. -SGLT2i: Continue Farxiga  10 mg daily. -MRA: Continue spironolactone  25 mg daily well. K is low. -ARNI: Consider transitioning form lisinopril  to Entresto  24-26 mg BID to start tomorrow night at 2200.  Plan: 1) Medication changes recommended at this time: -Consider transitioning form lisinopril  to Entresto  24-26 mg BID to start tomorrow night at 2200.  2) Patient assistance: -Entresto  is $58.39   3) Education: - Patient has been educated on current HF medications and potential additions to HF medication regimen - Patient verbalizes understanding that over the next few months, these medication doses may change and more medications may be added to optimize HF regimen - Patient has been educated on basic disease state pathophysiology and goals of therapy  Please do not hesitate to reach out with questions or concerns,  Jaun Bash, PharmD, CPP, BCPS, [facility] Heart Failure Pharmacist  Phone - [phone] 05/06/2024 10:08 AM

## 2024-05-06 NOTE — Progress Notes (Addendum)
 Ambulatory O2 test completed. Spo2 dropped to 82% on RA while ambulating. Patient recovered to 95% when placed back on 2L.

## 2024-05-06 NOTE — Progress Notes (Signed)
 MD aware of DBP of 56.

## 2024-05-06 NOTE — Care Management Important Message (Signed)
 Important Message  Patient Details  Name: Samantha Freeman MRN: [id number] Date of Birth: March 18, 1936   Important Message Given:  Yes - Medicare IM     Vinisha Faxon W, CMA 05/06/2024, 2:51 PM

## 2024-05-06 NOTE — Progress Notes (Signed)
 " PROGRESS NOTE   HPI was taken from Dr. Laurita: Samantha Freeman is a 89 y.o. female with medical history significant of PAF and PE on Eliquis , HTN, chronic HFpEF, CKD stage IIIa, IDDM, HLD, IBS, anxiety/depression, presented with worsening of shortness of breath.   Patient reported that she underwent ablation for A-fib/a flutter in November last year, and at some point afterwards, her metoprolol  was discontinued and she could not tell for what reason.  She checked her blood pressure occasionally and found her BP has been running high with SBP in the range of 170-180 last week and increasingly she is been feeling more shortness of breath she has had some dry cough and feeling  pressure-like sensation in the chest but denied any chest pain no fever or chills.  She reported recent weight gain but no significant ankle swelling.   ED Course: Afebrile, no tachycardia blood pressure 190/80 O2 saturation 95% on 4 L.  Chest x-ray showed moderate pulmonary congestion, blood work showed BUN 19 creatinine 1.1 WBC 6.7 hemoglobin 15.  Troponin 26> 41, proBNP 373, EKG normal sinus rhythm, no acute ST changes.   Patient was given IV Lasix  40 mg x 1 and pulled out 600 mL of urine and started to feel better.   1/14: Dropped oxygen to 82% and will need 2 L oxygen at discharge which is being set up by ICM   Dorthea Lowers  FMW:[id number] DOB: Jun 17, 1935 DOA: 05/04/2024 PCP: Auston Reyes BIRCH, MD   Assessment & Plan:   Principal Problem:   Acute on chronic diastolic CHF (congestive heart failure) (HCC) Active Problems:   CHF (congestive heart failure) (HCC)  Assessment and Plan:  Acute on chronic diastolic CHF: continue on IV lasix . Monitor I/Os. Continue on lisinopril , aldactone .   HTN: continue on lisinopril , aldactone , will adjust as needed for better blood pressure control  HTN emergency: emergency resolved .   PAF: s/p ablation. Continue on amio, eliquis . Holding home dose of cardizem  for borderline  bradycardia    DM2: well controlled, HbA1c 6.9. Continue on glargine, SSI w/ accuchecks, adding NovoLog  3 units with each meals for better blood sugar control   Hx of PE: continue on eliquis     CKDIIIa: Cr is stable from day prior. Avoid nephrotoxic meds    Depression: severity unknown. Continue on home dose of paroxetine    Obesity: BMI 32.2. Would benefit from weight loss    DVT prophylaxis: eliquis  Code Status: DNR Family Communication: Disposition Plan: likely d/c back home   Level of care: Telemetry  Status is: Inpatient Remains inpatient appropriate because: severity of illness    Subjective: Sitting the chair, complains shortness of breath although improving.  And was found to be hypoxic with oxygen saturations 82%  Objective: Vitals:   05/06/24 0527 05/06/24 0915 05/06/24 1215 05/06/24 1545  BP: (!) 149/67 (!) 137/56 (!) 146/77 (!) 163/65  Pulse: 66 72 70 68  Resp:  17 20 19   Temp:  98.1 F (36.7 C) 98 F (36.7 C) 98.3 F (36.8 C)  TempSrc:  Oral Oral Oral  SpO2:  96% 93% 96%  Weight: 87 kg     Height:        Intake/Output Summary (Last 24 hours) at 05/06/2024 1557 Last data filed at 05/06/2024 1420 Gross per 24 hour  Intake 1060 ml  Output 750 ml  Net 310 ml   Filed Weights   05/04/24 0341 05/06/24 0527  Weight: 85.3 kg 87 kg    Examination:  General exam:  Appears calm and comfortable  Respiratory system: decreased breath sounds b/l  Cardiovascular system: S1 & S2+. No rubs, gallops or clicks. Gastrointestinal system: Abdomen is soft, benign Central nervous system: Alert and oriented. Moves all extremities  Psychiatry: Judgement and insight appear normal. Mood & affect appropriate.     Data Reviewed: I have personally reviewed following labs and imaging studies  CBC: Recent Labs  Lab 05/04/24 0342  WBC 6.7  NEUTROABS 3.5  HGB 15.0  HCT 46.0  MCV 103.1*  PLT 208   Basic Metabolic Panel: Recent Labs  Lab 05/04/24 0342  05/05/24 0335 05/06/24 0431  NA 142 140 142  K 3.7 3.3* 3.5  CL 102 96* 97*  CO2 27 31 32  GLUCOSE 171* 154* 166*  BUN 19 20 30*  CREATININE 1.11* 1.11* 1.25*  CALCIUM  9.7 8.9 9.8  MG 1.8  --   --    GFR: Estimated Creatinine Clearance: 34.5 mL/min (A) (by C-G formula based on SCr of 1.25 mg/dL (H)). Liver Function Tests: Recent Labs  Lab 05/04/24 0342  AST 33  ALT 16  ALKPHOS 64  BILITOT 0.6  PROT 7.1  ALBUMIN 4.2   BNP (last 3 results) Recent Labs    05/04/24 0342  PROBNP 373.0*   HbA1C: Recent Labs    05/04/24 0342  HGBA1C 6.9*   CBG: Recent Labs  Lab 05/05/24 1230 05/05/24 1740 05/05/24 2126 05/06/24 0917 05/06/24 1212  GLUCAP 211* 157* 210* 224* 207*     Recent Results (from the past 240 hours)  Resp panel by RT-PCR (RSV, Flu A&B, Covid) Anterior Nasal Swab     Status: None   Collection Time: 05/04/24  3:35 AM   Specimen: Anterior Nasal Swab  Result Value Ref Range Status   SARS Coronavirus 2 by RT PCR NEGATIVE NEGATIVE Final    Comment: (NOTE) SARS-CoV-2 target nucleic acids are NOT DETECTED.  The SARS-CoV-2 RNA is generally detectable in upper respiratory specimens during the acute phase of infection. The lowest concentration of SARS-CoV-2 viral copies this assay can detect is 138 copies/mL. A negative result does not preclude SARS-Cov-2 infection and should not be used as the sole basis for treatment or other patient management decisions. A negative result may occur with  improper specimen collection/handling, submission of specimen other than nasopharyngeal swab, presence of viral mutation(s) within the areas targeted by this assay, and inadequate number of viral copies(<138 copies/mL). A negative result must be combined with clinical observations, patient history, and epidemiological information. The expected result is Negative.  Fact Sheet for Patients:  bloggercourse.com  Fact Sheet for Healthcare  Providers:  seriousbroker.it  This test is no t yet approved or cleared by the [place]  FDA and  has been authorized for detection and/or diagnosis of SARS-CoV-2 by FDA under an Emergency Use Authorization (EUA). This EUA will remain  in effect (meaning this test can be used) for the duration of the COVID-19 declaration under Section 564(b)(1) of the Act, 21 U.S.C.section 360bbb-3(b)(1), unless the authorization is terminated  or revoked sooner.       Influenza A by PCR NEGATIVE NEGATIVE Final   Influenza B by PCR NEGATIVE NEGATIVE Final    Comment: (NOTE) The Xpert Xpress SARS-CoV-2/FLU/RSV plus assay is intended as an aid in the diagnosis of influenza from Nasopharyngeal swab specimens and should not be used as a sole basis for treatment. Nasal washings and aspirates are unacceptable for Xpert Xpress SARS-CoV-2/FLU/RSV testing.  Fact Sheet for Patients: bloggercourse.com  Fact Sheet  for Healthcare Providers: seriousbroker.it  This test is not yet approved or cleared by the [place]  FDA and has been authorized for detection and/or diagnosis of SARS-CoV-2 by FDA under an Emergency Use Authorization (EUA). This EUA will remain in effect (meaning this test can be used) for the duration of the COVID-19 declaration under Section 564(b)(1) of the Act, 21 U.S.C. section 360bbb-3(b)(1), unless the authorization is terminated or revoked.     Resp Syncytial Virus by PCR NEGATIVE NEGATIVE Final    Comment: (NOTE) Fact Sheet for Patients: bloggercourse.com  Fact Sheet for Healthcare Providers: seriousbroker.it  This test is not yet approved or cleared by the [place]  FDA and has been authorized for detection and/or diagnosis of SARS-CoV-2 by FDA under an Emergency Use Authorization (EUA). This EUA will remain in effect (meaning this test can be  used) for the duration of the COVID-19 declaration under Section 564(b)(1) of the Act, 21 U.S.C. section 360bbb-3(b)(1), unless the authorization is terminated or revoked.  Performed at [facility], [address], [place], [place] [address]          Radiology Studies: DG Chest 1 View Result Date: 05/05/2024 EXAM: 1 VIEW(S) XRAY OF THE CHEST 05/05/2024 07:03:00 AM COMPARISON: 05/04/2024 CLINICAL HISTORY: The patient has congestive heart failure (CHF). ICD10: 02706 - Congestive heart failure. FINDINGS: LUNGS AND PLEURA: Streaky opacities at left lung base, possibly representing atelectasis or pneumonia. Decreased mild pulmonary edema. Possible small left pleural effusion. No pneumothorax. HEART AND MEDIASTINUM: Stable cardiomegaly. Aortic atherosclerosis. BONES AND SOFT TISSUES: Partially visualized left shoulder arthroplasty hardware. No acute osseous abnormality. IMPRESSION: 1. Decreased mild pulmonary edema. 2. Possible small left pleural effusion. 3. Stable cardiomegaly. 4. Streaky opacities at the left lung base, possibly representing atelectasis or pneumonia. Electronically signed by: Waddell Calk MD MD 05/05/2024 07:21 AM EST RP Workstation: [id number]        Scheduled Meds:  amiodarone   200 mg Oral Daily   apixaban   5 mg Oral BID   atorvastatin   20 mg Oral Daily   dapagliflozin  propanediol  10 mg Oral Daily   diclofenac  Sodium  2 g Topical QID   fenofibrate   54 mg Oral Daily   furosemide   40 mg Intravenous Daily   insulin  aspart  0-15 Units Subcutaneous TID WC   insulin  aspart  0-5 Units Subcutaneous QHS   insulin  glargine-yfgn  20 Units Subcutaneous Daily   levothyroxine   50 mcg Oral QAC breakfast   lisinopril   20 mg Oral Daily   pantoprazole   40 mg Oral Daily   PARoxetine   10 mg Oral Daily   prednisoLONE  acetate  1 drop Both Eyes QHS   rOPINIRole   1 mg Oral TID   sodium chloride  flush  3 mL Intravenous Q12H   spironolactone   25 mg Oral Daily    Continuous Infusions:   LOS: 2 days    Time spent 35 minutes   Cresencio Fairly, MD [facility] Pager [phone]  If 7PM-7AM, please contact night-coverage [email or web address] 05/06/2024, 3:57 PM   "

## 2024-05-06 NOTE — TOC Initial Note (Signed)
 Transition of Care [facility]) - Initial/Assessment Note    Patient Details  Name: Samantha Freeman MRN: [id number] Date of Birth: 1935/06/16  Transition of Care [facility]) CM/SW Contact:    Lauraine JAYSON Carpen, LCSW Phone Number: 05/06/2024, 2:17 PM  Clinical Narrative:  CSW met with patient. No family at bedside. CSW introduced role. Patient will need home oxygen. No preference but does want a portable tank. CSW ordered through [employer]. No further concerns. CSW will continue to follow patient for support and facilitate return home once stable. Son will transport her home at discharge.                Expected Discharge Plan: Home/Self Care Barriers to Discharge: Continued Medical Work up   Patient Goals and CMS Choice            Expected Discharge Plan and Services     Post Acute Care Choice: Durable Medical Equipment Living arrangements for the past 2 months: Apartment                 DME Arranged: Oxygen DME Agency: [facility] Date DME Agency Contacted: 05/06/24                Prior Living Arrangements/Services Living arrangements for the past 2 months: Apartment   Patient language and need for interpreter reviewed:: Yes Do you feel safe going back to the place where you live?: Yes      Need for Family Participation in Patient Care: Yes (Comment) Care giver support system in place?: Yes (comment)   Criminal Activity/Legal Involvement Pertinent to Current Situation/Hospitalization: No - Comment as needed  Activities of Daily Living   ADL Screening (condition at time of admission) Independently performs ADLs?: Yes (appropriate for developmental age) Is the patient deaf or have difficulty hearing?: No Does the patient have difficulty seeing, even when wearing glasses/contacts?: No Does the patient have difficulty concentrating, remembering, or making decisions?: No  Permission Sought/Granted   Permission granted to share information with : Yes, Verbal Permission Granted      Permission granted to share info w AGENCY: DME companies        Emotional Assessment Appearance:: Appears stated age Attitude/Demeanor/Rapport: Engaged, Gracious Affect (typically observed): Accepting, Appropriate, Calm, Pleasant Orientation: : Oriented to Self, Oriented to Place, Oriented to  Time, Oriented to Situation Alcohol  / Substance Use: Not Applicable Psych Involvement: No (comment)  Admission diagnosis:  CHF (congestive heart failure) (HCC) [I50.9] Hypoxia [R09.02] Acute on chronic diastolic congestive heart failure (HCC) [I50.33] Acute on chronic diastolic CHF (congestive heart failure) (HCC) [I50.33] Patient Active Problem List   Diagnosis Date Noted   CHF (congestive heart failure) (HCC) 05/04/2024   History of pulmonary embolus (PE) 06/05/2023   Acute on chronic diastolic CHF (congestive heart failure) (HCC) 06/05/2023   HLD (hyperlipidemia) 06/05/2023   Chronic kidney disease, stage 3a (HCC) 06/05/2023   Type II diabetes mellitus with renal manifestations (HCC) 06/05/2023   Diarrhea 06/05/2023   Overweight (BMI 25.0-29.9) 06/05/2023   Nocturnal hypoxia 04/08/2023   Acute kidney injury superimposed on CKD 04/07/2023   Acute metabolic encephalopathy 04/07/2023   Pulmonary embolism (HCC) 04/07/2023   Uncontrolled type 2 diabetes mellitus with hyperglycemia, with long-term current use of insulin  (HCC) 04/07/2023   Chest pain 04/07/2023   Epistaxis 04/01/2023   Hypercholesteremia 02/25/2023   Acute heart failure with preserved ejection fraction (HFpEF) (HCC) 02/25/2023   Generalized weakness 02/25/2023   Chronic atrial fibrillation (HCC) 02/02/2023   Dyslipidemia 02/02/2023   Anxiety and  depression 02/02/2023   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 with body mass index (BMI) of 33.0 to 33.9 in adult 02/02/2023   Acute hypoxic respiratory failure (HCC) 01/30/2023   Status post reverse arthroplasty of shoulder, left 10/13/2020   Mild aortic stenosis  05/23/2020   Moderate pulmonary valve regurgitation 05/23/2020   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   Polyneuropathy associated with underlying disease 03/13/2019   Acute respiratory disease due to COVID-19 virus 12/06/2018   Pneumonia due to COVID-19 virus 12/06/2018   HTN (hypertension) 12/06/2018   PNA (pneumonia) 12/06/2018   Rotator cuff tear 05/13/2018   Type 2 diabetes mellitus (HCC) 06/24/2015   Moderate mitral insufficiency 03/16/2015   Moderate tricuspid insufficiency 03/16/2015   GERD (gastroesophageal reflux disease) 09/22/2013   Fuchs' corneal dystrophy 08/19/2013   PCP:  Auston Reyes BIRCH, MD Pharmacy:   [facility] [address] [place] [address] Phone: [phone] Fax: [phone]  [id number] - [place], [place] - [address] [place] [address] Phone: [phone] Fax: [phone]     Social Drivers of Health (SDOH) Social History: SDOH Screenings   Food Insecurity: No Food Insecurity (05/05/2024)  Housing: Low Risk (05/05/2024)  Transportation Needs: No Transportation Needs (05/05/2024)  Utilities: Not At Risk (05/05/2024)  Financial Resource Strain: Low Risk  (10/23/2023)   Received from [facility]  Social Connections: Moderately Integrated (05/05/2024)  Tobacco Use: Low Risk (05/04/2024)   SDOH Interventions:     Readmission Risk Interventions    06/06/2023   12:35 PM 05/13/2023   10:56 AM 03/28/2023    4:20 PM  Readmission Risk Prevention Plan  Transportation Screening Complete Complete Complete  PCP or Specialist Appt within 3-5 Days Complete Complete Complete  HRI or Home Care Consult Complete Complete Complete  Social Work Consult for Recovery Care Planning/Counseling Complete Complete Complete  Palliative Care Screening Not Applicable Not Applicable Not Applicable  Medication Review Oceanographer) Complete Complete Complete

## 2024-05-07 ENCOUNTER — Other Ambulatory Visit: Payer: Self-pay

## 2024-05-07 DIAGNOSIS — R0902 Hypoxemia: Secondary | ICD-10-CM

## 2024-05-07 DIAGNOSIS — I509 Heart failure, unspecified: Secondary | ICD-10-CM

## 2024-05-07 LAB — CBC
HCT: 44.9 % (ref 36.0–46.0)
Hemoglobin: 14.7 g/dL (ref 12.0–15.0)
MCH: 33.8 pg (ref 26.0–34.0)
MCHC: 32.7 g/dL (ref 30.0–36.0)
MCV: 103.2 fL — ABNORMAL HIGH (ref 80.0–100.0)
Platelets: 214 K/uL (ref 150–400)
RBC: 4.35 MIL/uL (ref 3.87–5.11)
RDW: 13.2 % (ref 11.5–15.5)
WBC: 5.8 K/uL (ref 4.0–10.5)
nRBC: 0 % (ref 0.0–0.2)

## 2024-05-07 LAB — BASIC METABOLIC PANEL WITH GFR
Anion gap: 12 (ref 5–15)
BUN: 31 mg/dL — ABNORMAL HIGH (ref 8–23)
CO2: 31 mmol/L (ref 22–32)
Calcium: 9.9 mg/dL (ref 8.9–10.3)
Chloride: 98 mmol/L (ref 98–111)
Creatinine, Ser: 1.08 mg/dL — ABNORMAL HIGH (ref 0.44–1.00)
GFR, Estimated: 50 mL/min — ABNORMAL LOW
Glucose, Bld: 165 mg/dL — ABNORMAL HIGH (ref 70–99)
Potassium: 3.4 mmol/L — ABNORMAL LOW (ref 3.5–5.1)
Sodium: 141 mmol/L (ref 135–145)

## 2024-05-07 LAB — GLUCOSE, CAPILLARY: Glucose-Capillary: 190 mg/dL — ABNORMAL HIGH (ref 70–99)

## 2024-05-07 MED ORDER — LANTUS SOLOSTAR 100 UNIT/ML ~~LOC~~ SOPN: 20.0000 [IU] | PEN_INJECTOR | Freq: Every day | SUBCUTANEOUS | Status: AC

## 2024-05-07 MED ORDER — POTASSIUM CHLORIDE CRYS ER 20 MEQ PO TBCR
40.0000 meq | EXTENDED_RELEASE_TABLET | Freq: Once | ORAL | Status: AC
  Administered 2024-05-07: 40 meq via ORAL
  Filled 2024-05-07: qty 2

## 2024-05-07 MED ORDER — SACUBITRIL-VALSARTAN 24-26 MG PO TABS
1.0000 | ORAL_TABLET | Freq: Two times a day (BID) | ORAL | 0 refills | Status: AC
  Filled 2024-05-07: qty 60, 30d supply, fill #0

## 2024-05-07 NOTE — Plan of Care (Signed)

## 2024-05-07 NOTE — TOC Transition Note (Signed)
 Transition of Care [facility]) - Discharge Note   Patient Details  Name: Samantha Freeman MRN: [id number] Date of Birth: 08-29-1935  Transition of Care [facility]) CM/SW Contact:  Lauraine JAYSON Carpen, LCSW Phone Number: 05/07/2024, 10:12 AM   Clinical Narrative:   Patient has orders to discharge today. MD requested outpatient palliative referral. Patient has no preference. CSW sent referral to [facility]. No further concerns. CSW signing off.  Final next level of care: Home/Self Care Barriers to Discharge: Barriers Resolved   Patient Goals and CMS Choice            Discharge Placement                Patient to be transferred to facility by: Son   Patient and family notified of of transfer: 05/07/24  Discharge Plan and Services Additional resources added to the After Visit Summary for       Post Acute Care Choice: Durable Medical Equipment          DME Arranged: Oxygen DME Agency: [facility] Date DME Agency Contacted: 05/06/24                Social Drivers of Health (SDOH) Interventions SDOH Screenings   Food Insecurity: No Food Insecurity (05/05/2024)  Housing: Low Risk (05/05/2024)  Transportation Needs: No Transportation Needs (05/05/2024)  Utilities: Not At Risk (05/05/2024)  Financial Resource Strain: Low Risk  (10/23/2023)   Received from [facility]  Social Connections: Moderately Integrated (05/05/2024)  Tobacco Use: Low Risk (05/04/2024)     Readmission Risk Interventions    06/06/2023   12:35 PM 05/13/2023   10:56 AM 03/28/2023    4:20 PM  Readmission Risk Prevention Plan  Transportation Screening Complete Complete Complete  PCP or Specialist Appt within 3-5 Days Complete Complete Complete  HRI or Home Care Consult Complete Complete Complete  Social Work Consult for Recovery Care Planning/Counseling Complete Complete Complete  Palliative Care Screening Not Applicable Not Applicable Not Applicable  Medication Review Oceanographer) Complete  Complete Complete

## 2024-05-07 NOTE — Progress Notes (Addendum)
 Heart Failure Stewardship Pharmacy Note  PCP: Auston Reyes BIRCH, MD PCP-Cardiologist: None  HPI: Samantha Freeman is a 89 y.o. female with recent pulmonary embolism on Eliquis  with epistaxis requiring embolization, atrial fibrillation on Eliquis , CHF, type 2 diabetes, hypertension, hyperlipidemia, CKD stage IIIa, depression/anxiety who presented with fatigue and shortness of breath. On admission, proBNP was 373, HS-troponin was 26, and A1c 6.9. Chest x-ray noted mild pulmonary edema.  Pertinent cardiac history: LVEF in 01/2023 was 60-65% with indeterminate diastolic dysfunction. TTE 05/2023 noted LVEF of 70-75% with G3DD and IVS of 1.3 cm.  CMRI performed 07/2023 and noted LVEF of 55% and normal ECV with no evidence of infiltrative or inflammatory disease.   Pertinent Lab Values: Creatinine  Date Value Ref Range Status  02/15/2012 0.87 0.60 - 1.30 mg/dL Final   Creatinine, Ser  Date Value Ref Range Status  05/06/2024 1.25 (H) 0.44 - 1.00 mg/dL Final   BUN  Date Value Ref Range Status  05/06/2024 30 (H) 8 - 23 mg/dL Final  89/74/7986 14 7 - 18 mg/dL Final   Potassium  Date Value Ref Range Status  05/06/2024 3.5 3.5 - 5.1 mmol/L Final  02/15/2012 3.7 3.5 - 5.1 mmol/L Final   Sodium  Date Value Ref Range Status  05/06/2024 142 135 - 145 mmol/L Final  02/15/2012 140 136 - 145 mmol/L Final   B Natriuretic Peptide  Date Value Ref Range Status  06/05/2023 593.4 (H) 0.0 - 100.0 pg/mL Final    Comment:    Performed at [facility], [address], [place], [place] [address]   Magnesium   Date Value Ref Range Status  05/04/2024 1.8 1.7 - 2.4 mg/dL Final    Comment:    Performed at [facility], [address] [place], [place] [address]   Hgb A1c MFr Bld  Date Value Ref Range Status  05/04/2024 6.9 (H) 4.8 - 5.6 % Final    Comment:    (NOTE) Diagnosis of Diabetes The following HbA1c ranges recommended by the American Diabetes Association (ADA) may be used  as an aid in the diagnosis of diabetes mellitus.  Hemoglobin             Suggested A1C NGSP%              Diagnosis  <5.7                   Non Diabetic  5.7-6.4                Pre-Diabetic  >6.4                   Diabetic  <7.0                   Glycemic control for                       adults with diabetes.     Digoxin  Level  Date Value Ref Range Status  05/16/2023 0.5 (L) 0.8 - 2.0 ng/mL Final    Comment:    Performed at [facility], [address] [place], [place] [address]   TSH  Date Value Ref Range Status  01/30/2023 4.844 (H) 0.350 - 4.500 uIU/mL Final    Comment:    Performed by a 3rd Generation assay with a functional sensitivity of <=0.01 uIU/mL. Performed at [facility], [address] [place], [place] [address]    LDH  Date Value  Ref Range Status  12/11/2018 239 (H) 98 - 192 U/L Final    Comment:    Performed at [facility], [address], [place], [place] [address]   Vital Signs: Temp:  [98 F (36.7 C)-98.5 F (36.9 C)] 98 F (36.7 C) (01/14 2336) Pulse Rate:  [66-72] 71 (01/14 2336) Cardiac Rhythm: Heart block (01/14 1926) Resp:  [17-20] 20 (01/14 2336) BP: (137-163)/(56-77) 155/73 (01/14 2336) SpO2:  [93 %-96 %] 96 % (01/14 2336) Weight:  [87 kg (191 lb 12.8 oz)] 87 kg (191 lb 12.8 oz) (01/14 0527)  Intake/Output Summary (Last 24 hours) at 05/07/2024 0509 Last data filed at 05/06/2024 2252 Gross per 24 hour  Intake 820 ml  Output 1450 ml  Net -630 ml    Current Heart Failure Medications:  Loop diuretic: furosemide  40 mg IV daily Beta-Blocker: none ACEI/ARB/ARNI: lisinopril  20 mg daily MRA: spironolactone  25 mg daily SGLT2i: Farxiga  10 mg daily Other:  Prior to admission Heart Failure Medications:  Loop diuretic: furosemide  40 mg daily Beta-Blocker: metoprolol  tartrate 50 mg BID ACEI/ARB/ARNI: none MRA: spironolactone  25 mg daily SGLT2i: Farxiga  10 mg daily Other: diltiazem  ER 360 mg  daily (not taking), digoxin  0.125 mg daily   Assessment: 1. Acute on chronic diastolic heart failure (LVEF 55%) , due to presumed NICM. NYHA class III-IV symptoms.  -Symptoms: Denies all symptoms.  -Volume: Appears to be euvolemic. Creatinine improving. C -Hemodynamics: BP is elevated. HR 60s. -SGLT2i: Continue Farxiga  10 mg daily. -MRA: Continue spironolactone  25 mg daily well. K is low. Can consider supplementing with potassium chloride  ER 40 mEq for 2 doses. -ARNI: Agree with transitioning form lisinopril  to Entresto  24-26 mg BID to start tomorrow night at 2200.  K 3.4 today. Consider repleting to goal >4 given history of AFib.  Plan: 1) Medication changes recommended at this time: - None at this time.  2) Patient assistance: -Entresto  is $58.39   3) Education: - Patient has been educated on current HF medications and potential additions to HF medication regimen - Patient verbalizes understanding that over the next few months, these medication doses may change and more medications may be added to optimize HF regimen - Patient has been educated on basic disease state pathophysiology and goals of therapy  [name], PY4 Pharm D Candidate  Please do not hesitate to reach out with questions or concerns,  Jaun Bash, PharmD, CPP, BCPS, [facility] Heart Failure Pharmacist  Phone - [phone] 05/07/2024 5:09 AM

## 2024-05-07 NOTE — Progress Notes (Signed)
 AVS teaching provided to patient. All questions answered. No further needs identified. Telemetry and PIV removed. Patient will be assisted to discharge lounge.

## 2024-05-08 NOTE — Discharge Summary (Signed)
 " Physician Discharge Summary   Patient: Samantha Freeman MRN: [id number] DOB: 10-12-1935  Admit date:     05/04/2024  Discharge date: 05/07/2024  Discharge Physician: Cresencio Fairly   PCP: Auston Reyes BIRCH, MD   Recommendations at discharge:    F/up with outpt providers as requested  Discharge Diagnoses: Principal Problem:   Acute on chronic diastolic CHF (congestive heart failure) (HCC) Active Problems:   Hypoxia   CHF (congestive heart failure) [facility])  Hospital Course: Assessment and Plan:  89 y.o. female with medical history significant of PAF and PE on Eliquis , HTN, chronic HFpEF, CKD stage IIIa, IDDM, HLD, IBS, anxiety/depression, presented with worsening of shortness of breath. Patient reported that she underwent ablation for A-fib/a flutter in November last year, and at some point afterwards, her metoprolol  was discontinued and she could not tell for what reason.  She checked her blood pressure occasionally and found her BP has been running high with SBP in the range of 170-180 last week and increasingly she is been feeling more shortness of breath she has had some dry cough and feeling  pressure-like sensation in the chest but denied any chest pain no fever or chills.  She reported recent weight gain but no significant ankle swelling.   ED Course: Afebrile, no tachycardia blood pressure 190/80 O2 saturation 95% on 4 L.  Chest x-ray showed moderate pulmonary congestion, blood work showed BUN 19 creatinine 1.1 WBC 6.7 hemoglobin 15.  Troponin 26> 41, proBNP 373, EKG normal sinus rhythm, no acute ST changes.   Patient was given IV Lasix  40 mg x 1 and pulled out 600 mL of urine and started to feel better.   1/14: Dropped oxygen to 82% and will need 2 L oxygen at discharge which is being set up by [facility]   Acute on chronic diastolic CHF: Diuresed with IV lasix . Euvolemic at DC   HTN emergency: resolved .    PAF: s/p ablation. Continue on amio, eliquis .   DM2: well controlled, HbA1c 6.9.     Hx of PE: continue on eliquis     CKDIIIa: at baseline   Depression: Continue on home dose of paroxetine    Obesity: BMI 32.2. Would benefit from weight loss           Disposition: Home Diet recommendation:  Carb modified diet DISCHARGE MEDICATION: Allergies as of 05/07/2024       Reactions   Buspirone    Other Reaction(s): Dizziness   Propofol  Anaphylaxis   Zolpidem Other (See Comments)   Hallucinations   Cholestyramine Itching, Rash   Codeine Nausea Only   Hydrochlorothiazide  Other (See Comments)   PATIENT DOES NOT REMEMBER   Loratadine  Other (See Comments)   PATIENT DOES NOT REMEMBER   Niacin Rash        Medication List     STOP taking these medications    diltiazem  180 MG 24 hr capsule Commonly known as: CARDIZEM  CD   gabapentin  100 MG capsule Commonly known as: NEURONTIN    magnesium  oxide 400 (240 Mg) MG tablet Commonly known as: MAG-OX   multivitamin tablet   sodium chloride  0.65 % Soln nasal spray Commonly known as: OCEAN       TAKE these medications    rOPINIRole  1 MG tablet Commonly known as: REQUIP  Take 1 mg by mouth 3 (three) times daily. The timing of this medication is very important.   acetaminophen  500 MG tablet Commonly known as: TYLENOL  Take 500 mg by mouth every 6 (six) hours as needed for  mild pain (pain score 1-3).   ALPRAZolam  0.5 MG tablet Commonly known as: XANAX  Take 0.5 mg by mouth at bedtime as needed for anxiety or sleep.   amiodarone  200 MG tablet Commonly known as: PACERONE  Take 200 mg by mouth daily.   apixaban  5 MG Tabs tablet Commonly known as: ELIQUIS  Take 1 tablet (5 mg total) by mouth 2 (two) times daily.   atorvastatin  20 MG tablet Commonly known as: LIPITOR Take 20 mg by mouth daily.   benzonatate 200 MG capsule Commonly known as: TESSALON Take by mouth.   blood glucose meter kit and supplies Kit Dispense based on patient and insurance preference. Use up to four times daily as directed.  (FOR ICD-9 250.00, 250.01). For QAC - HS accuchecks.   CALCIUM  600/VITAMIN D3 PO Take 1 tablet by mouth daily.   clonazePAM 1 MG tablet Commonly known as: KLONOPIN Take 1 mg by mouth at bedtime as needed.   Coenzyme Q10 10 MG capsule Take 10 mg by mouth every morning.   cyanocobalamin  1000 MCG tablet Commonly known as: VITAMIN B12 Take 1,000 mcg by mouth daily.   dapagliflozin  propanediol 10 MG Tabs tablet Commonly known as: FARXIGA  Take 10 mg by mouth daily.   feeding supplement (GLUCERNA SHAKE) Liqd Take 237 mLs by mouth 3 (three) times daily between meals.   fenofibrate  48 MG tablet Commonly known as: TRICOR  Take 48 mg by mouth daily.   ferrous sulfate  325 (65 FE) MG tablet Take 325 mg by mouth daily with breakfast.   FREESTYLE LITE test strip Generic drug: glucose blood For glucose testing every before meals at bedtime. Diagnosis E 11.65  Can substitute to any accepted brand   furosemide  40 MG tablet Commonly known as: LASIX  Take 1 tablet (40 mg total) by mouth daily.   insulin  lispro 100 UNIT/ML injection Commonly known as: HUMALOG Inject 10-30 Units into the skin 3 (three) times daily before meals. Blood Glucose level: 140-199 - 7 units, 200-250 - 9 units, 251-299 - 13 units,  300-349 - 17 units,  350 or above 19 units.   Insulin  Syringe-Needle U-100 25G X 1 1 ML Misc For 4 times a day insulin  SQ, 1 month supply. Diagnosis E11.65   ipratropium 0.06 % nasal spray Commonly known as: ATROVENT  Place 2 sprays into both nostrils daily as needed for rhinitis.   Lantus  SoloStar 100 UNIT/ML Solostar Pen Generic drug: insulin  glargine Inject 20 Units into the skin at bedtime. What changed: how much to take   levothyroxine  50 MCG tablet Commonly known as: SYNTHROID  Take 50 mcg by mouth daily before breakfast.   metoprolol  tartrate 50 MG tablet Commonly known as: LOPRESSOR  Take 1 tablet (50 mg total) by mouth 2 (two) times daily.   Mounjaro 2.5 MG/0.5ML  Pen Generic drug: tirzepatide Inject 2.5 mg into the skin once a week.   omeprazole 20 MG capsule Commonly known as: PRILOSEC Take 20 mg by mouth 2 (two) times daily before a meal.   PARoxetine  10 MG tablet Commonly known as: PAXIL  Take 10 mg by mouth at bedtime.   prednisoLONE  acetate 1 % ophthalmic suspension Commonly known as: PRED FORTE  Place 1 drop into both eyes at bedtime.   sacubitril -valsartan  24-26 MG Commonly known as: Entresto  Take 1 tablet by mouth 2 (two) times daily.   spironolactone  25 MG tablet Commonly known as: ALDACTONE  Take 1 tablet (25 mg total) by mouth daily.        Follow-up Information     Suzzane Charleston,  PA-C. Schedule an appointment as soon as possible for a visit on 05/12/2024.   Specialty: Physician Assistant Why: [facility] Discharge F/UP  Hospital Follow-up: Jan 20th @ 11:30 AM. If you need to reschedule then call the office. Contact information: [address] [place] [facility] [place] [address] [phone]         Ammon Blunt, MD. Schedule an appointment as soon as possible for a visit on 05/21/2024.   Specialty: Cardiology Why: [facility] Discharge F/UP  Hopsital Follow-Up: Jan 29th 11:20 AM. Reesa information: [address] [place] [facility] [place] [address] [phone]         Parris Manna, MD. Schedule an appointment as soon as possible for a visit on 05/18/2024.   Specialty: Pulmonary Disease Why: [facility] Hospital Discharge F/UP  Hospital Follow-up: Jan 26th @ 9AM. If you need to reschedule then call the office. Contact information: [address] [place] [address] [phone]                Discharge Exam: Fredricka Weights   05/04/24 0341 05/06/24 0527 05/07/24 0545  Weight: 85.3 kg 87 kg 86.8 kg   General exam: Appears calm and comfortable  Respiratory system: decreased breath sounds b/l  Cardiovascular system: S1 & S2+. No  rubs, gallops or clicks. Gastrointestinal system: Abdomen is soft, benign Central nervous system: Alert and oriented. Moves all extremities  Psychiatry: Judgement and insight appear normal. Mood & affect appropriate.  Condition at discharge: fair  The results of significant diagnostics from this hospitalization (including imaging, microbiology, ancillary and laboratory) are listed below for reference.   Imaging Studies: DG Chest 1 View Result Date: 05/05/2024 EXAM: 1 VIEW(S) XRAY OF THE CHEST 05/05/2024 07:03:00 AM COMPARISON: 05/04/2024 CLINICAL HISTORY: The patient has congestive heart failure (CHF). ICD10: 02706 - Congestive heart failure. FINDINGS: LUNGS AND PLEURA: Streaky opacities at left lung base, possibly representing atelectasis or pneumonia. Decreased mild pulmonary edema. Possible small left pleural effusion. No pneumothorax. HEART AND MEDIASTINUM: Stable cardiomegaly. Aortic atherosclerosis. BONES AND SOFT TISSUES: Partially visualized left shoulder arthroplasty hardware. No acute osseous abnormality. IMPRESSION: 1. Decreased mild pulmonary edema. 2. Possible small left pleural effusion. 3. Stable cardiomegaly. 4. Streaky opacities at the left lung base, possibly representing atelectasis or pneumonia. Electronically signed by: Waddell Calk MD MD 05/05/2024 07:21 AM EST RP Workstation: [id number]   DG Chest Portable 1 View Result Date: 05/04/2024 EXAM: 1 VIEW(S) XRAY OF THE CHEST 05/04/2024 03:51:00 AM COMPARISON: 06/11/2023 CLINICAL HISTORY: SOB, hypoxic FINDINGS: LUNGS AND PLEURA: Interval increase of interstitial opacities. No pleural effusion. No pneumothorax. HEART AND MEDIASTINUM: Unchanged cardiomediastinal silhouette. Aortic calcification. BONES AND SOFT TISSUES: Reversed total left shoulder arthroplasty. IMPRESSION: 1. Mild pulmonary edema. Electronically signed by: Morgane Naveau MD MD 05/04/2024 03:54 AM EST RP Workstation: [id number]    Microbiology: Results for orders  placed or performed during the hospital encounter of 05/04/24  Resp panel by RT-PCR (RSV, Flu A&B, Covid) Anterior Nasal Swab     Status: None   Collection Time: 05/04/24  3:35 AM   Specimen: Anterior Nasal Swab  Result Value Ref Range Status   SARS Coronavirus 2 by RT PCR NEGATIVE NEGATIVE Final    Comment: (NOTE) SARS-CoV-2 target nucleic acids are NOT DETECTED.  The SARS-CoV-2 RNA is generally detectable in upper respiratory specimens during the acute phase of infection. The lowest concentration of SARS-CoV-2 viral copies this assay can detect is 138 copies/mL. A negative result does not preclude SARS-Cov-2 infection and should not  be used as the sole basis for treatment or other patient management decisions. A negative result may occur with  improper specimen collection/handling, submission of specimen other than nasopharyngeal swab, presence of viral mutation(s) within the areas targeted by this assay, and inadequate number of viral copies(<138 copies/mL). A negative result must be combined with clinical observations, patient history, and epidemiological information. The expected result is Negative.  Fact Sheet for Patients:  bloggercourse.com  Fact Sheet for Healthcare Providers:  seriousbroker.it  This test is no t yet approved or cleared by the [place]  FDA and  has been authorized for detection and/or diagnosis of SARS-CoV-2 by FDA under an Emergency Use Authorization (EUA). This EUA will remain  in effect (meaning this test can be used) for the duration of the COVID-19 declaration under Section 564(b)(1) of the Act, 21 U.S.C.section 360bbb-3(b)(1), unless the authorization is terminated  or revoked sooner.       Influenza A by PCR NEGATIVE NEGATIVE Final   Influenza B by PCR NEGATIVE NEGATIVE Final    Comment: (NOTE) The Xpert Xpress SARS-CoV-2/FLU/RSV plus assay is intended as an aid in the diagnosis of  influenza from Nasopharyngeal swab specimens and should not be used as a sole basis for treatment. Nasal washings and aspirates are unacceptable for Xpert Xpress SARS-CoV-2/FLU/RSV testing.  Fact Sheet for Patients: bloggercourse.com  Fact Sheet for Healthcare Providers: seriousbroker.it  This test is not yet approved or cleared by the [place]  FDA and has been authorized for detection and/or diagnosis of SARS-CoV-2 by FDA under an Emergency Use Authorization (EUA). This EUA will remain in effect (meaning this test can be used) for the duration of the COVID-19 declaration under Section 564(b)(1) of the Act, 21 U.S.C. section 360bbb-3(b)(1), unless the authorization is terminated or revoked.     Resp Syncytial Virus by PCR NEGATIVE NEGATIVE Final    Comment: (NOTE) Fact Sheet for Patients: bloggercourse.com  Fact Sheet for Healthcare Providers: seriousbroker.it  This test is not yet approved or cleared by the [place]  FDA and has been authorized for detection and/or diagnosis of SARS-CoV-2 by FDA under an Emergency Use Authorization (EUA). This EUA will remain in effect (meaning this test can be used) for the duration of the COVID-19 declaration under Section 564(b)(1) of the Act, 21 U.S.C. section 360bbb-3(b)(1), unless the authorization is terminated or revoked.  Performed at [facility], [address] [place], [place] [address]     Labs: CBC: Recent Labs  Lab 05/04/24 0342 05/07/24 0419  WBC 6.7 5.8  NEUTROABS 3.5  --   HGB 15.0 14.7  HCT 46.0 44.9  MCV 103.1* 103.2*  PLT 208 214   Basic Metabolic Panel: Recent Labs  Lab 05/04/24 0342 05/05/24 0335 05/06/24 0431 05/07/24 0419  NA 142 140 142 141  K 3.7 3.3* 3.5 3.4*  CL 102 96* 97* 98  CO2 27 31 32 31  GLUCOSE 171* 154* 166* 165*  BUN 19 20 30* 31*  CREATININE 1.11* 1.11* 1.25*  1.08*  CALCIUM  9.7 8.9 9.8 9.9  MG 1.8  --   --   --    Liver Function Tests: Recent Labs  Lab 05/04/24 0342  AST 33  ALT 16  ALKPHOS 64  BILITOT 0.6  PROT 7.1  ALBUMIN 4.2   CBG: Recent Labs  Lab 05/06/24 0917 05/06/24 1212 05/06/24 1622 05/06/24 2205 05/07/24 0855  GLUCAP 224* 207* 197* 219* 190*    Discharge time spent: greater than 30 minutes.  Signed: Cresencio Fairly,  MD [facility] 05/08/2024 "

## 2025-01-19 ENCOUNTER — Encounter: Admitting: Dermatology
